# Patient Record
Sex: Male | Born: 1950 | Race: White | Hispanic: No | Marital: Married | Smoking: Never smoker
Health system: Southern US, Community
[De-identification: ages and names within clinical notes are randomized; demographics above are authoritative.]

## PROBLEM LIST (undated history)

## (undated) DIAGNOSIS — M316 Other giant cell arteritis: Secondary | ICD-10-CM

## (undated) DIAGNOSIS — G709 Myoneural disorder, unspecified: Secondary | ICD-10-CM

## (undated) DIAGNOSIS — W5501XA Bitten by cat, initial encounter: Secondary | ICD-10-CM

## (undated) DIAGNOSIS — Z8619 Personal history of other infectious and parasitic diseases: Secondary | ICD-10-CM

## (undated) DIAGNOSIS — I10 Essential (primary) hypertension: Secondary | ICD-10-CM

## (undated) DIAGNOSIS — M1990 Unspecified osteoarthritis, unspecified site: Secondary | ICD-10-CM

## (undated) DIAGNOSIS — F32A Depression, unspecified: Secondary | ICD-10-CM

## (undated) DIAGNOSIS — Z974 Presence of external hearing-aid: Secondary | ICD-10-CM

## (undated) DIAGNOSIS — C61 Malignant neoplasm of prostate: Secondary | ICD-10-CM

## (undated) DIAGNOSIS — K529 Noninfective gastroenteritis and colitis, unspecified: Secondary | ICD-10-CM

## (undated) DIAGNOSIS — I639 Cerebral infarction, unspecified: Secondary | ICD-10-CM

## (undated) DIAGNOSIS — L03115 Cellulitis of right lower limb: Secondary | ICD-10-CM

## (undated) DIAGNOSIS — E119 Type 2 diabetes mellitus without complications: Secondary | ICD-10-CM

## (undated) DIAGNOSIS — F329 Major depressive disorder, single episode, unspecified: Secondary | ICD-10-CM

## (undated) DIAGNOSIS — I890 Lymphedema, not elsewhere classified: Secondary | ICD-10-CM

## (undated) HISTORY — PX: EYE SURGERY: SHX253

## (undated) HISTORY — DX: Myoneural disorder, unspecified: G70.9

## (undated) HISTORY — DX: Depression, unspecified: F32.A

## (undated) HISTORY — DX: Other giant cell arteritis: M31.6

## (undated) HISTORY — DX: Personal history of other infectious and parasitic diseases: Z86.19

## (undated) HISTORY — DX: Bitten by cat, initial encounter: W55.01XA

## (undated) HISTORY — DX: Cellulitis of right lower limb: L03.115

## (undated) HISTORY — DX: Major depressive disorder, single episode, unspecified: F32.9

## (undated) HISTORY — PX: OTHER SURGICAL HISTORY: SHX169

## (undated) HISTORY — PX: ABDOMINOPLASTY: SUR9

## (undated) HISTORY — DX: Essential (primary) hypertension: I10

## (undated) HISTORY — DX: Noninfective gastroenteritis and colitis, unspecified: K52.9

## (undated) HISTORY — DX: Malignant neoplasm of prostate: C61

## (undated) HISTORY — PX: TEMPORAL ARTERY BIOPSY / LIGATION: SUR132

---

## 2004-09-17 ENCOUNTER — Encounter: Payer: Self-pay | Admitting: Unknown Physician Specialty

## 2005-03-13 ENCOUNTER — Emergency Department: Payer: Self-pay | Admitting: Emergency Medicine

## 2005-06-28 ENCOUNTER — Other Ambulatory Visit: Payer: Self-pay

## 2005-06-28 ENCOUNTER — Inpatient Hospital Stay: Payer: Self-pay | Admitting: Internal Medicine

## 2005-07-26 ENCOUNTER — Ambulatory Visit: Payer: Self-pay | Admitting: Family Medicine

## 2005-08-14 ENCOUNTER — Ambulatory Visit: Payer: Self-pay | Admitting: Family Medicine

## 2005-08-17 ENCOUNTER — Ambulatory Visit: Payer: Self-pay | Admitting: Family Medicine

## 2006-11-07 ENCOUNTER — Ambulatory Visit: Payer: Self-pay | Admitting: Family Medicine

## 2006-11-19 ENCOUNTER — Ambulatory Visit: Payer: Self-pay | Admitting: Family Medicine

## 2007-01-04 ENCOUNTER — Ambulatory Visit: Payer: Self-pay | Admitting: Unknown Physician Specialty

## 2007-05-22 ENCOUNTER — Ambulatory Visit: Payer: Self-pay | Admitting: Unknown Physician Specialty

## 2007-07-22 ENCOUNTER — Ambulatory Visit: Payer: Self-pay | Admitting: Otolaryngology

## 2007-07-30 ENCOUNTER — Ambulatory Visit: Payer: Self-pay | Admitting: Internal Medicine

## 2007-11-05 LAB — HM COLONOSCOPY

## 2009-05-31 ENCOUNTER — Ambulatory Visit: Payer: Self-pay | Admitting: Family Medicine

## 2009-10-18 ENCOUNTER — Emergency Department: Payer: Self-pay | Admitting: Emergency Medicine

## 2009-10-21 ENCOUNTER — Ambulatory Visit: Payer: Self-pay | Admitting: Internal Medicine

## 2009-10-22 ENCOUNTER — Ambulatory Visit: Payer: Self-pay | Admitting: Internal Medicine

## 2009-12-28 ENCOUNTER — Other Ambulatory Visit: Payer: Self-pay | Admitting: Internal Medicine

## 2009-12-31 ENCOUNTER — Ambulatory Visit: Payer: Self-pay | Admitting: Internal Medicine

## 2011-06-29 ENCOUNTER — Other Ambulatory Visit: Payer: Self-pay | Admitting: Specialist

## 2011-07-14 ENCOUNTER — Other Ambulatory Visit: Payer: Self-pay | Admitting: Unknown Physician Specialty

## 2011-08-18 ENCOUNTER — Ambulatory Visit: Payer: Self-pay | Admitting: Family Medicine

## 2011-09-15 ENCOUNTER — Ambulatory Visit: Payer: Self-pay | Admitting: Unknown Physician Specialty

## 2011-09-18 ENCOUNTER — Ambulatory Visit: Payer: Self-pay | Admitting: Family Medicine

## 2011-09-18 LAB — PATHOLOGY REPORT

## 2011-11-28 ENCOUNTER — Ambulatory Visit: Payer: Self-pay | Admitting: Urology

## 2012-04-15 ENCOUNTER — Ambulatory Visit: Payer: Self-pay | Admitting: Family Medicine

## 2012-04-17 ENCOUNTER — Ambulatory Visit: Payer: Self-pay | Admitting: Family Medicine

## 2012-09-09 ENCOUNTER — Ambulatory Visit: Payer: Self-pay | Admitting: Internal Medicine

## 2012-09-10 HISTORY — PX: OTHER SURGICAL HISTORY: SHX169

## 2013-02-10 ENCOUNTER — Ambulatory Visit: Payer: Self-pay | Admitting: Family Medicine

## 2013-02-14 ENCOUNTER — Ambulatory Visit: Payer: Self-pay | Admitting: Family Medicine

## 2013-02-27 ENCOUNTER — Encounter: Payer: Self-pay | Admitting: Internal Medicine

## 2013-02-27 ENCOUNTER — Ambulatory Visit (INDEPENDENT_AMBULATORY_CARE_PROVIDER_SITE_OTHER): Payer: 59 | Admitting: Internal Medicine

## 2013-02-27 VITALS — BP 120/65 | HR 60 | Temp 97.8°F | Ht 71.0 in | Wt 364.0 lb

## 2013-02-27 DIAGNOSIS — R059 Cough, unspecified: Secondary | ICD-10-CM

## 2013-02-27 DIAGNOSIS — R609 Edema, unspecified: Secondary | ICD-10-CM

## 2013-02-27 DIAGNOSIS — I10 Essential (primary) hypertension: Secondary | ICD-10-CM

## 2013-02-27 DIAGNOSIS — R05 Cough: Secondary | ICD-10-CM

## 2013-02-27 MED ORDER — FAMOTIDINE 20 MG PO TABS: ORAL_TABLET | ORAL | Status: DC

## 2013-02-27 MED ORDER — NEBIVOLOL HCL 5 MG PO TABS: 5.0000 mg | ORAL_TABLET | Freq: Every day | ORAL | Status: DC

## 2013-02-27 MED ORDER — GABAPENTIN 300 MG PO CAPS: 300.0000 mg | ORAL_CAPSULE | Freq: Three times a day (TID) | ORAL | Status: DC

## 2013-02-27 MED ORDER — PANTOPRAZOLE SODIUM 40 MG PO TBEC: 40.0000 mg | DELAYED_RELEASE_TABLET | Freq: Every day | ORAL | Status: DC

## 2013-02-27 NOTE — Progress Notes (Signed)
Subjective:     Patient ID: Edward King, male   DOB: 13-Jul-1951, 62 y.o.   MRN: [id number]  HPI  72 yowm never smoker never very aerobically active but able to play baseball   fine until moved from [place] to [place] in 2001 but since then has developed chronic cough referred 02/28/2013 to pulmonary clinic by Dr Lelon Huh.  02/27/2013 1st pulmonary ov / Melvyn Novas cc recurrent cough x 10 years  lasting more than a few weeks assoc with fever and lung congestion and requiring abx typically comes on once a year and lasts 3 weeks to a month and lingering sense of chest congestion comes and goes assoc with doe x mall walking.  Has seen Dr Caryn Section x 3-4 times in last 4 weeks with resolution no fever, mucus is still light  Green after 3 abx plus inhaler assoc with sense of throat and sinus congestion with excessive pnds   Also worsening doe > slow adls and chronic leg swelling.  Can't lie flat s getting choked for about a year so sits up at > 30 degrees.  No obvious daytime variabilty or assoc   cp or chest tightness, subjective wheeze overt  hb symptoms. No unusual exp hx or h/o childhood pna/ asthma or premature birth to his knowledge.     Also denies any obvious fluctuation of symptoms with weather or environmental changes or other aggravating or alleviating factors except as outlined above   ROS  The following are not active complaints unless bolded sore throat, dysphagia, dental problems, itching, sneezing,  nasal congestion or excess/ purulent secretions, ear ache,   fever, chills, sweats, unintended wt loss, pleuritic or exertional cp, hemoptysis,  orthopnea pnd or leg swelling, presyncope, palpitations, heartburn, abdominal pain, anorexia, nausea, vomiting, diarrhea  or change in bowel or urinary habits, change in stools or urine, dysuria,hematuria,  rash, arthralgias, visual complaints, headache, numbness weakness or ataxia or problems with walking or coordination,  change in mood/affect or  memory.      Review of Systems     Objective:   Physical Exam Wt Readings from Last 3 Encounters:  02/27/13 364 lb (165.109 kg)     Massively obese pleasant amb wm who  failed to answer a single question asked in a straightforward manner, tending to go off on tangents or answer questions with ambiguous medical terms or diagnoses and seemed perplexed when asked the same question more than once for clarification.  Uses terms like sinus, bronchitis, congestion without being able to further define what is bothering him  HEENT: nl dentition, turbinates, and orophanx. Nl external ear canals without cough reflex   NECK :  without JVD/Nodes/TM/ nl carotid upstrokes bilaterally   LUNGS: no acc muscle use, clear to A and P bilaterally without cough on insp or exp maneuvers   CV:  RRR  no s3 or murmur or increase in P2,  ABD:  soft and nontender with nl excursion in the supine position. No bruits or organomegaly, bowel sounds nl  MS:  warm without deformities, calf tenderness, cyanosis or clubbing Massive chronic appearing bilateral leg swelling with pitting edema  SKIN: warm and dry without lesions    NEURO:  alert, approp, no deficits       Assessment:           Plan:

## 2013-02-27 NOTE — Patient Instructions (Addendum)
Try pantoprazole 40 mg  Take 30-60 min before first meal of the day and Pepcid 20 mg one bedtime until  Return  I think of reflux for chronic cough like I do oxygen for fire (doesn't cause the fire but once you get the oxygen suppressed it usually goes away regardless of the exact cause).   Increase your neurontin gabapentin)  to 300 mg with each meal  Stop lopressor and start bystolic 5 mg daily instead  GERD (REFLUX)  is an extremely common cause of respiratory symptoms just like yours, many times with no significant heartburn at all.    It can be treated with medication, but also with lifestyle changes including avoidance of late meals, excessive alcohol, smoking cessation, and avoid fatty foods, chocolate, peppermint, colas, red wine, and acidic juices such as orange juice.  NO MINT OR MENTHOL PRODUCTS SO NO COUGH DROPS  USE SUGARLESS CANDY INSTEAD (jolley ranchers or Stover's)  NO OIL BASED VITAMINS - use powdered substitutes  Schedule Sinus CT at [place] [facility]  Please schedule a follow up office visit in 4 weeks, sooner if needed

## 2013-02-28 DIAGNOSIS — R609 Edema, unspecified: Secondary | ICD-10-CM | POA: Insufficient documentation

## 2013-02-28 DIAGNOSIS — R059 Cough, unspecified: Secondary | ICD-10-CM | POA: Insufficient documentation

## 2013-02-28 DIAGNOSIS — R05 Cough: Secondary | ICD-10-CM | POA: Insufficient documentation

## 2013-02-28 NOTE — Assessment & Plan Note (Addendum)
The most common causes of chronic cough in immunocompetent adults include the following: upper airway cough syndrome (UACS), previously referred to as postnasal drip syndrome (PNDS), which is caused by variety of rhinosinus conditions; (2) asthma; (3) GERD; (4) chronic bronchitis from cigarette smoking or other inhaled environmental irritants; (5) nonasthmatic eosinophilic bronchitis; and (6) bronchiectasis.   These conditions, singly or in combination, have accounted for up to 94% of the causes of chronic cough in prospective studies.   Other conditions have constituted no >6% of the causes in prospective studies These have included bronchogenic carcinoma, chronic interstitial pneumonia, sarcoidosis, left ventricular failure, ACEI-induced cough, and aspiration from a condition associated with pharyngeal dysfunction.    Chronic cough is often simultaneously caused by more than one condition. A single cause has been found from 38 to 82% of the time, multiple causes from 18 to 62%. Multiply caused cough has been the result of three diseases up to 42% of the time.       Most likely this is  Classic Upper airway cough syndrome, so named because it's frequently impossible to sort out how much is  CR/sinusitis with freq throat clearing (which can be related to primary GERD)   vs  causing  secondary (" extra esophageal")  GERD from wide swings in gastric pressure that occur with throat clearing, often  promoting self use of mint and menthol lozenges that reduce the lower esophageal sphincter tone and exacerbate the problem further in a cyclical fashion.   These are the same pts (now being labeled as having "irritable larynx syndrome" by some cough centers) who not infrequently have a history of having failed to tolerate ace inhibitors,  dry powder inhalers or biphosphonates or report having atypical reflux symptoms that don't respond to standard doses of PPI , and are easily confused as having aecopd or asthma  flares by even experienced allergists/ pulmonologists.   Next step is sinus ct and max empirical rx for GERD.  See instructions for specific recommendations which were reviewed directly with the patient who was given a copy with highlighter outlining the key components.   Already on neurontin so increase to 300 tid to cover irritable larynx syndrome and change lopressor to bystolic short term to cover ? Cough variant asthma then once better baseline switch back to lopressor or a cheaper generic selective B blocker like biosprolol    The standardized cough guidelines published in Chest by Lissa Morales in 2006 are still the best available and consist of a multiple step process (up to 12!) , not a single office visit,  and are intended  to address this problem logically,  with an alogrithm dependent on response to empiric treatment at  each progressive step  to determine a specific diagnosis with  minimal addtional testing needed. Therefore if adherence is an issue or can't be accurately verified,  it's very unlikely the standard evaluation and treatment will be successful here.

## 2013-02-28 NOTE — Assessment & Plan Note (Signed)
?   Cough variant asthma then once better baseline switch back to lopressor or a cheaper generic selective B blocker like biosprolol

## 2013-02-28 NOTE — Assessment & Plan Note (Addendum)
Pt says this is chronic and doesn't correlate with his sob but worrisome for R ht failure related to Morbid obesity or OHS  Will defer w/u to Dr Caryn Section but would rec check albumin, tsh, bnp,  creatinine and overnight pulse ox RA along with Echo if not recently done.

## 2013-03-05 ENCOUNTER — Ambulatory Visit: Payer: Self-pay | Admitting: Internal Medicine

## 2013-03-10 ENCOUNTER — Telehealth: Payer: Self-pay | Admitting: Internal Medicine

## 2013-03-10 NOTE — Telephone Encounter (Signed)
Have not seen any results

## 2013-03-10 NOTE — Telephone Encounter (Signed)
Patient inquiring about CT results done @ [facility] last week. Dr. Melvyn Novas have you seen these results? Please advise, thank you

## 2013-03-10 NOTE — Telephone Encounter (Signed)
I have sent fax to [facility] requesting the results Will await fax

## 2013-03-10 NOTE — Telephone Encounter (Signed)
Please advise Magda Paganini thanks

## 2013-03-11 NOTE — Telephone Encounter (Signed)
Called, spoke with pt.  Requesting CT sinus results done a week ago.   Advised Magda Paganini has requested these results yesterday. Pt didn't understanding [facility] could be part of [facility] but we not have results yet. I explained to him that there system is not linked with ours yet. He was still frustrated that he hasn't heard anything stating "I could be dying and we don't know."  CT results have been received but they are CT Chest results; MW ordered CT Sinus. I have spoken with Magda Paganini and have re-requested to have CT Sinus results faxed to triage.   MW is off today and tomorrow.  Given pt is frustrated with this, will see if doc of the day can advise until MW returns. Will await CT Sinus results.

## 2013-03-11 NOTE — Telephone Encounter (Signed)
Patient calling back about ct results.

## 2013-03-11 NOTE — Telephone Encounter (Signed)
CT sinus results received. Will give to Doc of Day to address, please. Thank You

## 2013-03-12 NOTE — Telephone Encounter (Signed)
Place whatever we have from [facility] in my lookat stack and I'll manually enter under Problem list where everyone can find it

## 2013-03-12 NOTE — Telephone Encounter (Signed)
Spoke with pt and notified of results and he verbalized understanding

## 2013-03-12 NOTE — Telephone Encounter (Signed)
I don't see these as final under result section - were they outside results?

## 2013-03-12 NOTE — Telephone Encounter (Signed)
Dr Lenna Gilford reviewed the ct sinus results  CT was neg  I have ATC the pt and had to [facility] Will place results of ct sinus and chest in [place] for review

## 2013-03-13 ENCOUNTER — Encounter: Payer: Self-pay | Admitting: Internal Medicine

## 2013-03-28 ENCOUNTER — Encounter: Payer: 59 | Admitting: Internal Medicine

## 2013-04-21 ENCOUNTER — Ambulatory Visit: Payer: 59 | Admitting: Internal Medicine

## 2013-11-04 ENCOUNTER — Ambulatory Visit: Payer: Self-pay | Admitting: Family Medicine

## 2013-11-17 ENCOUNTER — Ambulatory Visit: Payer: Self-pay | Admitting: Family Medicine

## 2013-12-30 ENCOUNTER — Ambulatory Visit: Payer: Self-pay | Admitting: Otolaryngology

## 2014-02-25 ENCOUNTER — Ambulatory Visit: Payer: Self-pay | Admitting: Family Medicine

## 2014-02-27 LAB — HM HEPATITIS C SCREENING LAB: HM Hepatitis Screen: NEGATIVE

## 2014-05-06 DIAGNOSIS — E1149 Type 2 diabetes mellitus with other diabetic neurological complication: Secondary | ICD-10-CM | POA: Insufficient documentation

## 2014-05-06 DIAGNOSIS — E119 Type 2 diabetes mellitus without complications: Secondary | ICD-10-CM | POA: Insufficient documentation

## 2014-08-22 HISTORY — PX: OTHER SURGICAL HISTORY: SHX169

## 2014-08-29 ENCOUNTER — Inpatient Hospital Stay: Payer: Self-pay | Admitting: Internal Medicine

## 2014-08-29 HISTORY — PX: DOPPLER ECHOCARDIOGRAPHY: SHX263

## 2014-08-29 LAB — CBC WITH DIFFERENTIAL/PLATELET
BASOS PCT: 1 %
Basophil #: 0.1 10*3/uL (ref 0.0–0.1)
EOS ABS: 0.1 10*3/uL (ref 0.0–0.7)
Eosinophil %: 1.3 %
HCT: 51.7 % (ref 40.0–52.0)
HGB: 16.5 g/dL (ref 13.0–18.0)
LYMPHS ABS: 1.3 10*3/uL (ref 1.0–3.6)
Lymphocyte %: 11.5 %
MCH: 27.6 pg (ref 26.0–34.0)
MCHC: 31.8 g/dL — AB (ref 32.0–36.0)
MCV: 87 fL (ref 80–100)
MONO ABS: 0.9 x10 3/mm (ref 0.2–1.0)
Monocyte %: 8.2 %
NEUTROS ABS: 8.7 10*3/uL — AB (ref 1.4–6.5)
Neutrophil %: 78 %
PLATELETS: 250 10*3/uL (ref 150–440)
RBC: 5.96 10*6/uL — ABNORMAL HIGH (ref 4.40–5.90)
RDW: 15 % — ABNORMAL HIGH (ref 11.5–14.5)
WBC: 11.2 10*3/uL — AB (ref 3.8–10.6)

## 2014-08-29 LAB — BASIC METABOLIC PANEL
ANION GAP: 7 (ref 7–16)
BUN: 18 mg/dL (ref 7–18)
CALCIUM: 8.4 mg/dL — AB (ref 8.5–10.1)
CO2: 29 mmol/L (ref 21–32)
CREATININE: 1.16 mg/dL (ref 0.60–1.30)
Chloride: 102 mmol/L (ref 98–107)
EGFR (African American): >60
GLUCOSE: 150 mg/dL — AB (ref 65–99)
Osmolality: 280 (ref 275–301)
Potassium: 3.5 mmol/L (ref 3.5–5.1)
SODIUM: 138 mmol/L (ref 136–145)

## 2014-08-29 LAB — SEDIMENTATION RATE: ERYTHROCYTE SED RATE: 1 mm/h (ref 0–20)

## 2014-08-30 LAB — CBC WITH DIFFERENTIAL/PLATELET
Basophil #: 0 10*3/uL (ref 0.0–0.1)
Basophil %: 0.3 %
EOS PCT: 0.1 %
Eosinophil #: 0 10*3/uL (ref 0.0–0.7)
HCT: 56.3 % — ABNORMAL HIGH (ref 40.0–52.0)
HGB: 18.1 g/dL — ABNORMAL HIGH (ref 13.0–18.0)
LYMPHS PCT: 9.6 %
Lymphocyte #: 0.9 10*3/uL — ABNORMAL LOW (ref 1.0–3.6)
MCH: 27.9 pg (ref 26.0–34.0)
MCHC: 32.1 g/dL (ref 32.0–36.0)
MCV: 87 fL (ref 80–100)
MONO ABS: 0.1 x10 3/mm — AB (ref 0.2–1.0)
MONOS PCT: 0.6 %
NEUTROS PCT: 89.4 %
Neutrophil #: 8.3 10*3/uL — ABNORMAL HIGH (ref 1.4–6.5)
PLATELETS: 243 10*3/uL (ref 150–440)
RBC: 6.47 10*6/uL — ABNORMAL HIGH (ref 4.40–5.90)
RDW: 15 % — AB (ref 11.5–14.5)
WBC: 9.3 10*3/uL (ref 3.8–10.6)

## 2014-08-30 LAB — LIPID PANEL
Cholesterol: 174 mg/dL (ref 0–200)
HDL: 40 mg/dL (ref 40–60)
Ldl Cholesterol, Calc: 119 mg/dL — ABNORMAL HIGH (ref 0–100)
Triglycerides: 73 mg/dL (ref 0–200)
VLDL Cholesterol, Calc: 15 mg/dL (ref 5–40)

## 2014-08-30 LAB — BASIC METABOLIC PANEL
Anion Gap: 6 — ABNORMAL LOW (ref 7–16)
BUN: 20 mg/dL — ABNORMAL HIGH (ref 7–18)
CHLORIDE: 99 mmol/L (ref 98–107)
Calcium, Total: 9 mg/dL (ref 8.5–10.1)
Co2: 27 mmol/L (ref 21–32)
Creatinine: 1.1 mg/dL (ref 0.60–1.30)
EGFR (African American): >60
EGFR (Non-African Amer.): >60
Glucose: 196 mg/dL — ABNORMAL HIGH (ref 65–99)
OSMOLALITY: 273 (ref 275–301)
Potassium: 4.5 mmol/L (ref 3.5–5.1)
SODIUM: 132 mmol/L — AB (ref 136–145)

## 2014-08-30 LAB — HEMOGLOBIN A1C: Hemoglobin A1C: 6 % (ref 4.2–6.3)

## 2014-09-01 LAB — PATHOLOGY REPORT

## 2014-10-09 LAB — HEPATIC FUNCTION PANEL
ALT: 22 U/L (ref 10–40)
AST: 13 U/L — AB (ref 14–40)

## 2014-10-09 LAB — CBC AND DIFFERENTIAL
HEMATOCRIT: 52 % (ref 41–53)
Hemoglobin: 18 g/dL — AB (ref 13.5–17.5)
Platelets: 205 10*3/uL (ref 150–399)
WBC: 11.5 10*3/mL

## 2014-10-09 LAB — HEMOGLOBIN A1C: HEMOGLOBIN A1C: 6.2 % — AB (ref 4.0–6.0)

## 2014-10-09 LAB — LIPID PANEL
Cholesterol: 200 mg/dL (ref 0–200)
HDL: 62 mg/dL (ref 35–70)
LDL Cholesterol: 118 mg/dL
Triglycerides: 101 mg/dL (ref 40–160)

## 2014-10-09 LAB — BASIC METABOLIC PANEL
BUN: 27 mg/dL — AB (ref 4–21)
Creatinine: 1.3 mg/dL (ref 0.6–1.3)
GLUCOSE: 80 mg/dL
POTASSIUM: 3.9 mmol/L (ref 3.4–5.3)
Sodium: 144 mmol/L (ref 137–147)

## 2014-10-09 LAB — TSH: TSH: 2.55 u[IU]/mL (ref 0.41–5.90)

## 2014-10-09 LAB — PSA: PSA: 6.6

## 2014-12-18 DIAGNOSIS — C61 Malignant neoplasm of prostate: Secondary | ICD-10-CM

## 2014-12-18 HISTORY — DX: Malignant neoplasm of prostate: C61

## 2014-12-29 ENCOUNTER — Ambulatory Visit: Payer: Self-pay | Admitting: Rheumatology

## 2015-03-05 ENCOUNTER — Encounter
Admit: 2015-03-05 | Disposition: A | Discharge status: Home / Self Care | Payer: Self-pay | Attending: Family Medicine | Admitting: Family Medicine

## 2015-03-19 ENCOUNTER — Encounter
Admit: 2015-03-19 | Disposition: A | Discharge status: Home / Self Care | Payer: Self-pay | Attending: Family Medicine | Admitting: Family Medicine

## 2015-04-10 NOTE — Consult Note (Signed)
General Aspect Visual loss with temporal arteritis   Present Illness The patient is a 64 year old, male with past medical history of hypertension and diabetes mellitus who was seen by his dentist for toothache. The patient reported that he was having jaw pain bilaterally, regarding which he was seen by his dentist approximately 1 week ago. The patient was initially diagnosed with TMJ and he was given a Medrol Dosepak. The patient has completed a 1 week course of Medrol Dosepak with no significant improvement.  The patient eventually, started developing pain in his temporal area and visual loss in the left eye for the past 2 days for which he ws seen by his eye doctor. According to the patient's ophthalmologist, Dr. Delynn Flavin. The patient???s  vision was 20/20 in his left eye yesterday, but today the patient was having difficulty and he can barely see anything, but light. As there was a concern for acute loss of vision of the left eye, which would most likely be from giant cell arteritis, Dr. Karren Burly had this patient as a direct admission to the hospital to give high-dose Solu-Medrol. She has contacted me directly for an urgent temporal artery biopsy.  The patient denies any dysphagia and difficulty with speech. Denies any weakness or numbness in his extremities. He has never had any history of stroke or mini stroke in the past, but he is concerned about his headache.  The patient denies any chest pain or shortness of breath. No other consults.   PAST MEDICAL HISTORY: Diabetes mellitus type 2, hypertension and Diabetic neuropathy.   Home Medications: Medication Instructions Status  acetaminophen-oxyCODONE 325 mg-5 mg oral tablet 1 tab(s) orally 3 times a day, As Needed - for Pain Active  predniSONE 50 mg oral tablet 2 tab(s) orally once a day Active  Valsartan 160-12.5  one tablet  orally once a day Active  Metformin 500 mg 2 tablets at lunch and 1 tablet at supper  orally 2 times a day  Active  Gabapentin 300 mg 1 capsule at lunch and 2 at night  orally 2 times a day Active  Glipizide 5 mg one tablet  orally once a day Active  Etodolac 500 mg 2 tablets  orally once a day Active  Citolopram 20 mg one tablet  orally once a day Active  Lialda 1.2 grams 2 tablets  orally once a day Active    No Known Allergies:   Case History:  Family History Non-Contributory   Social History negative tobacco, negative ETOH, negative Illicit drugs   Review of Systems:  Fever/Chills No   Cough No   Sputum No   Abdominal Pain No   Diarrhea No   Constipation No   Nausea/Vomiting No   SOB/DOE No   Chest Pain No   Telemetry Reviewed NSR   Dysuria No   Physical Exam:  GEN well developed, well nourished, no acute distress   HEENT PERRL, hearing intact to voice, moist oral mucosa   NECK supple  trachea midline   RESP normal resp effort  no use of accessory muscles   CARD regular rate  no JVD   ABD denies tenderness  soft   EXTR negative cyanosis/clubbing, negative edema   SKIN No rashes, No ulcers   NEURO cranial nerves intact, follows commands   PSYCH alert, A+O to time, place, person, good insight   Nursing/Ancillary Notes: **Vital Signs.:   13-Sep-15 06:15  Vital Signs Type Routine  Temperature Temperature (F) 97.6  Celsius 36.4  Temperature  Source oral  Pulse Pulse 75  Respirations Respirations 21  Systolic BP Systolic BP 169  Diastolic BP (mmHg) Diastolic BP (mmHg) 89  Mean BP 108  Pulse Ox % Pulse Ox % 94  Pulse Ox Activity Level  At rest  Oxygen Delivery Room Air/ 21 %   Routine Chem:  13-Sep-15 04:50   Cholesterol, Serum 174  Triglycerides, Serum 73  HDL (INHOUSE) 40  VLDL Cholesterol Calculated 15  LDL Cholesterol Calculated  119 (Result(s) reported on 30 Aug 2014 at 05:34AM.)  Glucose, Serum  196  BUN  20  Creatinine (comp) 1.10  Sodium, Serum  132  Potassium, Serum 4.5  Chloride, Serum 99  CO2, Serum 27  Calcium (Total), Serum  9.0  Anion Gap  6  Osmolality (calc) 273  eGFR (African American) >60  eGFR (Non-African American) >60 (eGFR values <56m/min/1.73 m2 may be an indication of chronic kidney disease (CKD). Calculated eGFR is useful in patients with stable renal function. The eGFR calculation will not be reliable in acutely ill patients when serum creatinine is changing rapidly. It is not useful in  patients on dialysis. The eGFR calculation may not be applicable to patients at the low and high extremes of body sizes, pregnant women, and vegetarians.)  Hemoglobin A1c ([facility]) 6.0 (The American Diabetes Association recommends that a primary goal of therapy should be <7% and that physicians should reevaluate the treatment regimen in patients with HbA1c values consistently >8%.)  Routine Hem:  13-Sep-15 04:50   WBC (CBC) 9.3  RBC (CBC)  6.47  Hemoglobin (CBC)  18.1  Hematocrit (CBC)  56.3  Platelet Count (CBC) 243  MCV 87  MCH 27.9  MCHC 32.1  RDW  15.0  Neutrophil % 89.4  Lymphocyte % 9.6  Monocyte % 0.6  Eosinophil % 0.1  Basophil % 0.3  Neutrophil #  8.3  Lymphocyte #  0.9  Monocyte #  0.1  Eosinophil # 0.0  Basophil # 0.0 (Result(s) reported on 30 Aug 2014 at 05:21AM.)   [place]    12-Sep-15 18:05, [place] B[place]  [place] Bilateral   REASON FOR EXAM:    CVA  COMMENTS:       PROCEDURE: [place] - [place] DOPPL[place] BILATERAL  - Aug 29 2014  6:05PM     CLINICAL DATA:  CVA, history of hypertension, visual disturbance and  diabetes.    EXAM:  BILATERAL CAROTID DUPLEX ULTRASOUND    TECHNIQUE:  GPearline Cablesscale imaging, color Doppler and duplex ultrasound were  performed of bilateral carotid and vertebral arteries in the neck.  COMPARISON:  Head CT -08/29/2014; 12/30/2013    FINDINGS:  The examination is degraded due to patient body habitus and poor  sonographic window.    Criteria: Quantification of carotid stenosis is based on velocity  parameters that correlate the  residual internal carotid diameter  with NASCET-based stenosis levels, using the diameter of the distal  internal carotid lumen as the denominator for stenosis measurement.    The following velocity measurements were obtained:    RIGHT  ICA:  100.5/24.6 cm/sec    CCA:  1678.9/38.1cm/sec    SYSTOLIC ICA/CCA RATIO:  00.17   DIASTOLIC ICA/CCA RATIO:  15.10   ECA:  107.2 cm/sec    LEFT    ICA:  81.8/24.9 cm/sec    CCA:  1258.5/27.7cm/sec  SYSTOLIC ICA/CCA RATIO:  0.6    DIASTOLIC ICA/CCA RATIO:  18.24   ECA:  115.6 cm/sec  RIGHT CAROTID ARTERY: There is a minimal amount of eccentric  echogenic plaque within the right carotid bulb (image 6), not  resulting in elevated peak systolic velocities within the  interrogated course of the right internal carotid artery to suggest  a hemodynamically significant stenosis.    RIGHT VERTEBRAL ARTERY:  Antegrade flow    LEFT CAROTID ARTERY: There is a very minimal amount of intimal wall  thickening within the left carotid bulb (images 40 and 53), not  resulting in elevated peak systolic velocities within the  interrogated course the left internal carotid artery to suggest a  hemodynamically significant stenosis.    LEFT VERTEBRAL ARTERY:  Antegrade flow     IMPRESSION:  Degraded examination demonstrates a minimal amount of bilateral  intimal thickening and atherosclerotic plaque, not resulting in  hemodynamically significant stenosis.      Electronically Signed    By: Sandi Mariscal M.D.    On: 08/31/2014 08:16         Verified By: Aileen Fass, M.D.,    Impression 1. 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 The patietn is started on Solu-Medrol 250 mg IV every 6 hours for the next 3 days, as recommended by ophthalmologist. After 3 days, we will continue the patient on prednisone 100 mg p.o. once daily. Given that he has already been started on steroids as an out patietn I will  plan for Temporal artery biopsy tomorrow.  Risks and benefits were reviewed all questions answered he agree to proceed with surgery.   2. 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 We will also rule out transient ischemic attack.  Ophthalmology consult is placed.  3. Diabetic neuropathy. We will continue gabapentin, which is his home medication.  4. History of diabetes mellitus. The patient will be on sliding scale insulin.  5. Hypertension. We will resume his home medications if the initial lab work is normal.   Plan level 3 consult   Electronic Signatures: Hortencia Pilar (MD)  (Signed 27-Sep-15 13:24)  Authored: General Aspect/Present Illness, Home Medications, Allergies, History and Physical Exam, Vital Signs, Labs, Radiology, Impression/Plan   Last Updated: 27-Sep-15 13:24 by Hortencia Pilar (MD)

## 2015-04-10 NOTE — Consult Note (Signed)
Referring Physician:  [name] :   Primary Care Physician:  [name] : [facility], [address], [place], [place] [address], [place] [phone]  Reason for Consult: Admit Date: 30-Aug-2014  Chief Complaint: L eye vision loss  Reason for Consult: CVA   History of Present Illness: History of Present Illness:   64 yo RHD M presents to [facility] from opthtamologist secondary to vision loss.  Apparently two weeks ago, pt reports headache and jaw pain in which he saw his dentist.  He recieved steroids and symptoms got better but then when his steroids were tapered he developed vision loss in the L eye only.  Pt reports that he saw sparkly lights and then this improved again after a few days of steroids.  Vision still blurry on the L but improved.  No numbness, weakness, tingling reported now.  ROS:  General denies complaints   HEENT L eye vision loss, jaw pain   Lungs no complaints   Cardiac no complaints   GI no complaints   GU no complaints   Musculoskeletal no complaints   Extremities no complaints   Skin no complaints   Neuro no complaints   Endocrine no complaints   Psych no complaints   Past Medical/Surgical Hx:  depression:   HTN:   Past Medical/ Surgical Hx:  Past Medical History HTN, depression, DM   Past Surgical History none   Home Medications: Medication Instructions Last Modified Date/Time  Valsartan 160-12.5  one tablet  orally once a day 12-Sep-15 16:59  Metformin 500 mg 2 tablets at lunch and 1 tablet at supper  orally 2 times a day 12-Sep-15 16:59  Gabapentin 300 mg 1 capsule at lunch and 2 at night  orally 2 times a day 12-Sep-15 16:59  Glipizide 5 mg one tablet  orally once a day 12-Sep-15 16:59  Etodolac 500 mg 2 tablets  orally once a day 12-Sep-15 16:59  Citolopram 20 mg one tablet  orally once a day 12-Sep-15 16:59  Lialda 1.2 grams 2 tablets  orally once a day 12-Sep-15 16:59   Allergies:  No Known Allergies:    Allergies:  Allergies NKDA   Social/Family History: Employment Status: currently employed  Lives With: spouse  Living Arrangements: house  Social History: no tob, no EtOH, no illicits  Family History: no stroke or seizures   Vital Signs: **Vital Signs.:   13-Sep-15 16:58  Vital Signs Type Routine  Temperature Temperature (F) 97.9  Celsius 36.6  Temperature Source oral  Pulse Pulse 87  Respirations Respirations 20  Systolic BP Systolic BP 384  Diastolic BP (mmHg) Diastolic BP (mmHg) 63  Mean BP 85  Pulse Ox % Pulse Ox % 94  Pulse Ox Activity Level  At rest  Oxygen Delivery Room Air/ 21 %   Physical Exam: General: overweight, NAD  HEENT: normocephalic, sclera nonicteric, oropharynx clear, no papilledema  Neck: supple, no JVD, no bruits  Chest: CTA B, no wheezing, good movement  Cardiac: RRR, no murmurs, no edema, 2+ pulses  Extremities: no C/C/E, FROM   Neurologic Exam: Mental Status: alert and oriented x 3, normal speech and language, follows complex commands  Cranial Nerves: PERRLA, EOMI, nl VF, face symmetric, tongue midline, shoulder shrug equal  Motor Exam: 5/5 B normal, tone, no tremor  Deep Tendon Reflexes: 2+/4 B, plantars downgoing B, no Hoffman  Sensory Exam: pinprick, temperature, and vibration intact B  Coordination: FTN and HTS WNL, nl RAM   Lab Results: LabObservation:  12-Sep-15 17:37   OBSERVATION Reason for Test  Routine Chem:  13-Sep-15 04:50   Cholesterol, Serum 174  Triglycerides, Serum 73  HDL (INHOUSE) 40  VLDL Cholesterol Calculated 15  LDL Cholesterol Calculated  119 (Result(s) reported on 30 Aug 2014 at 05:34AM.)  Glucose, Serum  196  BUN  20  Creatinine (comp) 1.10  Sodium, Serum  132  Potassium, Serum 4.5  Chloride, Serum 99  CO2, Serum 27  Calcium (Total), Serum 9.0  Anion Gap  6  Osmolality (calc) 273  eGFR (African American) >60  eGFR (Non-African American) >60 (eGFR values <73m/min/1.73 m2 may be an indication of  chronic kidney disease (CKD). Calculated eGFR is useful in patients with stable renal function. The eGFR calculation will not be reliable in acutely ill patients when serum creatinine is changing rapidly. It is not useful in  patients on dialysis. The eGFR calculation may not be applicable to patients at the low and high extremes of body sizes, pregnant women, and vegetarians.)  Hemoglobin A1c ([facility]) 6.0 (The American Diabetes Association recommends that a primary goal of therapy should be <7% and that physicians should reevaluate the treatment regimen in patients with HbA1c values consistently >8%.)  Routine Hem:  12-Sep-15 16:46   Erythrocyte Sed Rate 1 (Result(s) reported on 29 Aug 2014 at 05:24PM.)  13-Sep-15 04:50   WBC (CBC) 9.3  RBC (CBC)  6.47  Hemoglobin (CBC)  18.1  Hematocrit (CBC)  56.3  Platelet Count (CBC) 243  MCV 87  MCH 27.9  MCHC 32.1  RDW  15.0  Neutrophil % 89.4  Lymphocyte % 9.6  Monocyte % 0.6  Eosinophil % 0.1  Basophil % 0.3  Neutrophil #  8.3  Lymphocyte #  0.9  Monocyte #  0.1  Eosinophil # 0.0  Basophil # 0.0 (Result(s) reported on 30 Aug 2014 at 0[facility])   Radiology Results: CT:    12-Sep-15 17:28, CT Head Without Contrast  CT Head Without Contrast   REASON FOR EXAM:    CVA  COMMENTS:       PROCEDURE: CT  - CT HEAD WITHOUT CONTRAST  - Aug 29 2014  5:28PM     CLINICAL DATA:  Vision loss.  History of giant cell arteritis.    EXAM:  CT HEAD WITHOUT CONTRAST    TECHNIQUE:  Contiguous axial images were obtained from the base of the skull  through the vertex without intravenous contrast.    COMPARISON:  None.  FINDINGS:  The ventricles are normal in size and configuration. No extra-axial  fluid collections are identified. The gray-white differentiation is  normal. No CT findings for acute intracranial process such as  hemorrhage or infarction. No mass lesions. The brainstem and  cerebellum are grossly normal.    The bony structures  are intact. The paranasal sinuses and mastoid  air cells are clear. The globes are intact.     IMPRESSION:  No acute intracranial findings or mass lesion.      Electronically Signed    By: MKalman JewelsM.D.    On: 08/29/2014 17:40         Verified By: PMarlane Hatcher M.D.,   Radiology Impression: Radiology Impression: CT of head personally reviewed by me and is normal   Impression/Recommendations: Recommendations:   prior notes reviewed by me  reviewed by me   Possible temporal arteritis-  pt does not describe all system facts but ESR may be falsely low due to prior steroids;  biopsy results pending;  could also be diabetic or hypertensive retinopathy or anterior retinal artery occlusion. Headache-  resolved Peripheral neuropathy-  stable continue steroids per opthtamology start ASA 45m daily if not on it check carotid [place]af/u biopsy with vascular no acute neurologic needs now, please call with questions or concerns  Electronic Signatures: SJamison Neighbor(MD)  (Signed 13-Sep-15 17:32)  Authored: REFERRING PHYSICIAN, Primary Care Physician, Consult, History of Present Illness, Review of Systems, PAST MEDICAL/SURGICAL HISTORY, HOME MEDICATIONS, ALLERGIES, Social/Family History, NURSING VITAL SIGNS, Physical Exam-, LAB RESULTS, RADIOLOGY RESULTS, Recommendations   Last Updated: 13-Sep-15 17:32 by SJamison Neighbor(MD)

## 2015-04-10 NOTE — H&P (Signed)
PATIENT NAME:  Edward King, Edward King MR#:  [id number] DATE OF BIRTH:  03-Jun-1951  DATE OF ADMISSION:  08/29/2014  PRIMARY CARE PHYSICIAN: Kirstie Peri. Caryn Section, MD.  REFERRING PHYSICIAN: Ophthalmology, Grayland Ormond, MD.  CHIEF COMPLAINT: Acute loss of left-sided vision.   HISTORY OF PRESENT ILLNESS: The patient is a 64 year old, Caucasian male with past medical history of hypertension and diabetes mellitus who was seen by his ophthalmologist for toothache in the temporal region. Actually, the patient is reporting that he was having jaw pain bilaterally, regarding which he was seen by his dentist approximately 1 week ago. The patient was initially diagnosed with TMJ and he was given a Medrol Dosepak. The patient has completed a 1 week course of Medrol Dosepak with no significant improvement. He was followed up by his dentist, who thought the jaw pain could be from grinding teeth. The patient eventually, started developing this temporal area headache for the past 2 days and was seen by his eye doctor. According to the patient's ophthalmologist, Dr. Delynn Flavin. The patient's  vision was 20/20 in his left eye yesterday, but today the patient was having difficulty and he can barely see anything, but light. As there was a concern for acute loss of vision of the left eye, which could probably be from giant cell arteritis, Dr. Karren Burly had this patient as a direct admission to the hospital to give high-dose Solu-Medrol. She also has recommended carotid Dopplers and 2-D echocardiogram.    The patient denies any dysphagia and difficulty with speech. Denies any weakness or numbness in his extremities. He has never had any history of stroke or mini stroke in the past, but he is concerned about his headache. Dr. Karren Burly will also talk to the vascular surgeons regarding temporal artery biopsy. The patient denies any chest pain or shortness of breath. No other consults.   PAST MEDICAL HISTORY: Diabetes mellitus  type 2, hypertension and Diabetic neuropathy.    PAST SURGICAL HISTORY: Remote history of abdominal surgery.   ALLERGIES: No known drug allergies.   PSYCHOSOCIAL HISTORY: Lives at home with wife. No history of smoking, alcohol or illicit drug usage.   FAMILY HISTORY: Mom has history of hypertension. Diabetes does not run in his family.   HOME MEDICATIONS: Valsartan 150/12.5 once daily, metformin 500 mg 2 times a day, Lialda 1.2 gram 2 tablets p.o. once daily, glipizide 5 mg p.o. once daily, gabapentin 300 mg 1 tablet p.o. 2 times a day, Etodolac 500 mg 2 tablets p.o. once daily, citalopram 20 mg p.o. once daily.   REVIEW OF SYSTEMS:  CONSTITUTIONAL: Denies any fever, fatigue, weight loss or weight gain.  EYES: Denies redness or pain, but complaining of acute loss of vision in the left eye and intact vision in the right eye.  EARS: No ear discharge.  NOSE: No epistaxis.  LUNGS: Denies any COPD, shortness of breath, asthma.  CARDIOVASCULAR: Denies any heart attacks, palpitations, syncope, valvular problems.  GASTROINTESTINAL: Denies any hematemesis, melena, anemia, hematochezia, nausea, vomiting, diarrhea, constipation.  NEUROLOGIC: Denies any vertigo, ataxia, history of stroke or TIA in the past.  MUSCULOSKELETAL: Denies any back pain, neck pain, arthritis.  ENDOCRINE: Has history of diabetes mellitus. Denies any thyroid problems. Denies any cold or heat intolerance. Denies any polyuria, nocturia.  PSYCHIATRIC: Denies any ADD, OCD.   PHYSICAL EXAMINATION: VITAL SIGNS: Temperature 98.4, pulse 80, respirations 18, blood pressure 134/75, pulse oximetry 95%.  GENERAL APPEARANCE: Not in acute distress. Moderately built and nourished.  HEENT: Normocephalic, atraumatic. Pupils  are equally reacting to light and accommodation. No scleral icterus. No conjunctival injection. In the left eye, peripheral field of vision is diminished, but the patient can count fingers and identify colors. Right eye is  intact. Oral cavity is intact. Moist mucous membranes and trachea  is midline.  NECK: Supple. No JVD. No thyromegaly. Range of motion is intact.  LUNGS: Clear to auscultation bilaterally. No accessory muscle use noted. Chest wall normal on palpation. CARDIAC: S1 and S2 normal. Regular rate and rhythm. No murmurs.  GASTROINTESTINAL: Soft. Bowel sounds are positive in all 4 quadrants. Nontender, nondistended. No hepatosplenomegaly. No masses felt.  NEUROLOGIC: Awake, alert and oriented x 3. Cranial nerves 2 through 12 are grossly intact except for the loss of vision in left eye. Reflexes are 2+. Motor and sensory are intact.  EXTREMITIES: No edema. No cyanosis. No clubbing.  SKIN: Warm to touch. Normal turgor. No rashes. No lesions.  MUSCULOSKELETAL: No joint effusion, tenderness, erythema.  PSYCHIATRIC: Normal mood and affect.   DIAGNOSTIC DATA: CT head STAT is ordered, which is pending. Carotid Dopplers and 2-D echocardiogram are pending.   LABORATORY DATA: STAT CBC, Chem-8, ESR and CRP are pending.   ASSESSMENT AND PLAN: A 64 year old, Caucasian male is sent over to the hospital as a direct admission from his eye doctor for acute loss of vision on the left side. Dr. Karren Burly is concerned left-sided central retinal artery occlusion and possible temporal arteritis.   ASSESSMENT AND PLAN: 1. 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 We will also rule out transient ischemic attack. Admit the patient to telemetry. Initial STAT blood work is ordered including CBC, Chem-8, ESR and CRP. A STAT CT of the head is ordered, which is pending. Echocardiogram and carotid Dopplers are pending. We will get neuro checks. Physical therapy consult is ordered. We will provide him aspirin 81 mg if CT of the head is negative for acute bleed. Ophthalmology consult is placed.  2. Temporal headache, most likely from  temporal/giant cell, arteritis. We will start him on Solu-Medrol 250 mg IV every 6 hours for the next 3 days, as recommended by ophthalmologist. After 3 days, we will continue the patient on prednisone 100 mg p.o. once daily. Vascular surgery consult is placed to the on-call vascular surgeon, Dr. Delana Meyer regarding possible temporal artery biopsy as soon as possible, as the patient already used a Medrol Dosepak for 1 week prior to this admission. Dr. Karren Burly is going to talk to Dr. Delana Meyer regarding the temporal artery biopsy.  3. Possible transient ischemic attack versus cerebrovascular accident. We will get a stroke work-up with a CT of the head, carotid Dopplers and 2-D echocardiogram. The patient will be started on aspirin after STAT CT of the head if it is negative for acute bleeds.  4. History of diabetes mellitus. The patient will be on sliding scale insulin. If his Chem-8 is normal, we will consider resuming his home medications.  5. Hypertension. We will resume his home medications if the initial lab work is normal.  6. Diabetic neuropathy. We will continue gabapentin, which is his home medication. We will provide him gastrointestinal and deep vein thrombosis prophylaxis with Pepcid and Lovenox subcutaneous.  7. He is full code. Wife is the [place]. Diagnosis and plan of care was discussed in detail with the patient and his wife at bedside. They both verbalized understanding of the plan.  TOTAL TIME SPENT ON ADMISSION: Was 45 minutes.  Thank you, Dr. Karren Burly, for sending this patient as a direct admit to the hospitalist service.    ____________________________ [name], MD ag:TT D: 08/29/2014 17:28:13 ET T: 08/29/2014 18:14:54 ET JOB#: [id number]  cc: [name], MD, <Dictator> Grayland Ormond, MD Kirstie Peri. Caryn Section, MD Katha Cabal, MD   [name] MD ELECTRONICALLY SIGNED 08/30/2014 17:30

## 2015-04-10 NOTE — Op Note (Signed)
PATIENT NAME:  Edward King, Edward King MR#:  [id number] DATE OF BIRTH:  1951-02-08  DATE OF PROCEDURE:  08/30/2014  PREOPERATIVE DIAGNOSES:  1.  Visual loss, left eye.  2.  Giant cell arteritis.   POSTOPERATIVE DIAGNOSES: 1.  Visual loss, left eye. 2.  Giant cell arteritis.  PROCEDURE PERFORMED: Left temporal artery biopsy.   SURGEON: Katha Cabal, MD   ANESTHESIA: MAC.   ESTIMATED BLOOD LOSS: Minimal.   SPECIMEN: A 5.5 cm segment of temporal artery to pathology for permanent section.   INDICATIONS: Mr. Chill is a 64 year old gentleman who presented to Dr. Karren Burly with acute loss of vision, left eye. He was found to have a retinal artery occlusion, high suspicion for temporal arteritis is noted and the patient is admitted for IV steroids and is undergoing biopsy for confirmation of diagnosis. Risks and benefits are reviewed. All questions answered. The patient agrees to proceed.   DESCRIPTION OF PROCEDURE: The patient is taken to the operating room, placed in supine position. After adequate sedation is achieved, his left temporal area is shaved and then prepped and draped in a sterile fashion. Appropriate timeout is called.   Lidocaine 1% with epinephrine is infiltrated in the soft tissues and a linear incision is created along the anterior margin of his ear. Temporal artery is identified below the fascia and then dissected proximally and distally. It is ligated with 4-0 Vicryl proximally and distally and then removed without difficulty. It is measured on the back table and noted to be 5.5 cm. The wound is then irrigated. Hemostasis is obtained. It is closed with 3-0 Vicryl for the subcutaneous tissues and 4-0 Monocryl for the subcuticular. Dermabond is applied. The patient tolerated the procedure well and there were no immediate complications. Sponge and needle counts were correct. He was taken to the recovery room in excellent condition.    ____________________________ Katha Cabal, MD ggs:TT D: 08/30/2014 16:10:50 ET T: 08/30/2014 17:54:27 ET JOB#: [id number]  cc: Katha Cabal, MD, <Dictator> Grayland Ormond, MD Kirstie Peri. Caryn Section, MD Katha Cabal MD ELECTRONICALLY SIGNED 10/05/2014 20:41

## 2015-04-10 NOTE — Consult Note (Signed)
PATIENT NAME:  Edward King, Edward King MR#:  [id number] DATE OF BIRTH:  08/17/1951  DATE OF CONSULTATION:  08/30/2014  REFERRING PHYSICIAN:  [name], MD CONSULTING PHYSICIAN:  Grayland Ormond, MD  REASON FOR CONSULTATION: Possible temporal arteritis.   HISTORY OF PRESENT ILLNESS: The patient is a 64 year old white male with a history of type 2 diabetes, obesity, and hypertension who presented to me on 08/28/2014 with intermittent episodes of blurred vision in the left eye, which would remain blurred for about 5 minutes and then return to a normal 20/20 baseline afterwards. The patient's only other review of systems at the time had included jaw pain for which he had been seeing his dentist. 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 He told me that the pain was improved on that medication and when asked, the patient specified that the jaw pain did not worsen upon chewing, but simply was worse upon awakening in the morning, presumably because the jaw grinding happened at night.   On examination on 08/28/2014, the patient had 20/20 vision in both eyes and essentially a normal eye exam in both eyes. Of note, there were no intravascular plaques in the vasculature of the left eye. There was mild dry eye on the left eye, for which the patient was given a sample of artificial tears. Because temporal arteritis was on the differential diagnosis for the patient's complaint, as well as an embolic phenomenon to the eyes, I had sent the patient for sedimentation rate, CRP, and platelets as well as set him up for carotid Dopplers as well as a cardiac echocardiogram to be done very soon. The patient was to follow up with me the following week. He returned to me, however, on 08/29/2014 stating that he had multiple episodes of the complete blurring of vision in  only the left eye that was returning back to baseline just like before, but it was happening more often and he asked to be seen again.   I saw him in clinic and at the time, his vision had severely diminished from previous 20/20 to now light perception only on the left eye. The right eye remained 20/20 and at its normal baseline. The patient stated that sometime around 1:30 that afternoon, he had an episode of blurring that was very severe and that never improved back.   On that examination, I noted that the patient, in the left eye, had what appeared to be a very early central retinal artery occlusion, mild pallor of the optic nerve head, as well as a 3+ afferent pupillary defect in the left eye. His vasculature did not show any intravascular plaques at that time either. The right eye was examined and found to be at its normal baseline as well.   A check of the patient's lab results revealed a sedimentation rate of 5, a C-reactive protein of 17.0 as well as platelet count of 277,000. Given those results and given that the patient had only the day before finished his Medrol Dosepak, the decision was made to admit the patient for potential temporal arteritis causing vision loss in the left eye. Prior to leaving, the patient was given rebreathing therapy to see if his vision could improve in the left eye and it did not.   PAST MEDICAL HISTORY: As stated above, includes diabetes type 2  as well as hypertension.  PAST EYE HISTORY: Essentially unremarkable other than the fact that he has a mild refractive error.   FAMILY HISTORY: The patient does not have any significant eye history in the family aside from refractive error.   PHYSICAL EXAMINATION: as of 08/29/2014, the patient's visual acuity in the right eye was 20/20 at distance with correction and the left eye was light perception only with correction. Intraocular pressure was 17 in the right eye, 18 in the left eye. Motility was full. The right eye had  confrontational visual fields that were full. The left eye was generally depressed. Pupils, as mentioned before, left eye showed a 3+ afferent pupillary defect. Anterior segment exam was essentially normal with normal eyelids, conjunctivae, cornea, and anterior chamber in both eyes. On the posterior segment exam, the optic nerve on the right side was flat, pink, and sharp with a 0.3 cup-to-disc ratio. On the left, there was 1+ pallor of the nerve, a 0.3 cup-to-disc ratio without any frank swelling of the edges of the optic nerve. The macula on the right eye as well as the vessels and periphery were all within normal limits. In the left eye, macula had mild diffuse swelling and pallor and no intravascular plaques were found. The periphery was fully attached.   ASSESSMENT: This 64 year old diabetic, obese, hypertensive white male exhibits some symptoms that are concerning for temporal arteritis, although he is somewhat young for the condition and is not exhibiting totally classic examination findings. Otherwise on the differential for the patient's central retinal artery occlusion, this could be due to an embolic phenomenon such as from the carotid arteries or from an intramural plaque potentially from the heart. There is the remote chance of a myxoma. There is also a remote chance that the patient has some sort of clotting disorder, but there is no indication otherwise that this is the case.   PLAN:  This was all discussed with the patient's primary hospitalist and a plan was made to start the patient on IV Solu-Medrol a gram a day divided into 4 doses x 12 doses, so over 3 days, and subsequent to that to start the patient on oral prednisone at 100 mg a day afterwards. I spoke with Dr. Delana Meyer, the vascular surgeon, for help with scheduling the patient for a temporal artery biopsy. In addition, I asked for carotid dopplers as well as for carotid echocardiogram and I will continue to follow this patient as an  inpatient while he is here.   I appreciate the help of all the consultants who are on board with this patient's case.    ____________________________ Grayland Ormond, MD apv:ah D: 08/30/2014 13:17:16 ET T: 08/30/2014 13:25:43 ET JOB#: [id number]  cc: Grayland Ormond, MD, <Dictator> Sharrod Achille P VIN [facility] MD ELECTRONICALLY SIGNED 09/09/2014 13:46

## 2015-04-10 NOTE — Consult Note (Signed)
Brief Consult Note: Diagnosis: left eye visual loss.   Patient was seen by consultant.   Discussed with Attending MD.   Comments: will procced with biopsy today when OR available.  Electronic Signatures: Hortencia Pilar (MD)  (Signed 13-Sep-15 23:06)  Authored: Brief Consult Note   Last Updated: 13-Sep-15 23:06 by Hortencia Pilar (MD)

## 2015-04-10 NOTE — Discharge Summary (Signed)
PATIENT NAME:  Edward King, Edward King MR#:  [id number] DATE OF BIRTH:  24-May-1951  DATE OF ADMISSION:  08/29/2014 DATE OF DISCHARGE:  09/01/2014  DISCHARGE DIAGNOSES: 1.  Temporal arteritis.  2.  Acute left vision loss.  3.  Hypertension.  4.  Diabetes mellitus.  5.  Obesity.   CONSULTATIONS:  1.  Dr. Karren Burly of ophthalmology.  2.  Dr. Tamala Julian with neurology.  3.  Dr. Delana Meyer with vascular surgery.   HOME MEDICATIONS: 1.  Valsartan 160/12.5 one tablet daily.  2.  Metformin 5 mg 2 tablets 2 times a day. 3.  Gabapentin 300 mg at lunch and 2 at night.   4.  Glipizide 5 mg oral once a day.  5.  Etodolac 500 mg 2 tablets once a day.  6.  Citalopram 20 mg once a day.  7.  Lialda 1.2 grams 2 tablets once a day.  8.  Prednisone 100 mg oral once a day for 15 days.  9.  Acetaminophen oxycodone 325/5 one tablet oral 3 times a day as needed for pain.   IMAGING STUDIES: Include a CT scan of the head without contrast which showed nothing acute.   A carotid Doppler showed no significant stenosis.   Echocardiogram showed ejection fraction of 60% to 65% with no significant abnormalities.   ADMITTING HISTORY AND PHYSICAL AND HOSPITAL COURSE: Please see detailed H and P dictated by Dr. Margaretmary Eddy. In brief, a 64 year old male patient with history of acute left-sided vision loss was admitted to the hospitalist service directly from his ophthalmologist's office secondary to concern for a temporal arteritis. The patient had a CT scan of the head done which was normal. Echo and carotid Doppler showed nothing acute. The patient had started on Solu-Medrol 250 mg IV q. 6 hours which has received 12 doses of over 3 days. Dr. Delana Meyer with vascular surgery was consulted who did a temporal artery biopsy which is pending at this point. The patient was seen by neurology who did not feel like patient has any optic neuritis.   Today on examination, the patient is much improved. Still has some mild blurred vision on his left  eye. Motor strength in upper and lower extremities is normal. Sensation was intact all over and has had a normal gait, ambulated well in the hallway.   DISCHARGE INSTRUCTIONS: Low-sodium, carbohydrate-controlled diet. Follow up with Dr. Karren Burly of ophthalmology in 1 or 2 weeks.   TIME SPENT ON DAY OF DISCHARGE IN DISCHARGE ACTIVITY: 40 minutes.    ____________________________ Edward Alf Kiersten Coss, MD srs:at D: 09/01/2014 15:55:14 ET T: 09/01/2014 17:36:12 ET JOB#: [id number]  cc: Alveta Heimlich R. Chelcie Estorga, MD, <Dictator> Neita Carp MD ELECTRONICALLY SIGNED 09/05/2014 14:32

## 2015-04-15 ENCOUNTER — Other Ambulatory Visit: Payer: Self-pay | Admitting: Radiology

## 2015-04-15 DIAGNOSIS — C61 Malignant neoplasm of prostate: Secondary | ICD-10-CM

## 2015-04-21 ENCOUNTER — Ambulatory Visit
Admission: RE | Admit: 2015-04-21 | Discharge: 2015-04-21 | Disposition: A | Discharge status: Home / Self Care | Payer: 59 | Source: Ambulatory Visit | Attending: Radiology | Admitting: Radiology

## 2015-04-21 DIAGNOSIS — C61 Malignant neoplasm of prostate: Secondary | ICD-10-CM | POA: Diagnosis not present

## 2015-04-22 ENCOUNTER — Ambulatory Visit: Payer: 59 | Attending: Family Medicine | Admitting: Occupational Therapy

## 2015-04-22 ENCOUNTER — Encounter: Payer: Self-pay | Admitting: Occupational Therapy

## 2015-04-22 DIAGNOSIS — R262 Difficulty in walking, not elsewhere classified: Secondary | ICD-10-CM | POA: Insufficient documentation

## 2015-04-22 DIAGNOSIS — I890 Lymphedema, not elsewhere classified: Secondary | ICD-10-CM | POA: Diagnosis not present

## 2015-04-22 DIAGNOSIS — M6281 Muscle weakness (generalized): Secondary | ICD-10-CM | POA: Insufficient documentation

## 2015-04-22 NOTE — Patient Instructions (Signed)
Patient educated on wear and care of new compression garments.  He is to wear knee high compression garments bilaterally each day, applying in the morning and removing before bedtime to shower.  He will then transition to his night time garments Ginger [employer]) to wear nightly.  He was educated on washing daytime compression garments each day for optimal results and as per manufacturers guidelines.  Patient is aware of the need for compression wrapping if his edema increases and he cannot fit into compression garments, he will then need to wrap 24-48 hours and retry garments.

## 2015-04-23 NOTE — Therapy (Signed)
[place] MAIN [facility] SERVICES [address] [place], [place], [address] Phone: [phone]   Fax:  [phone]  Occupational Therapy Treatment  Patient Details  Name: Edward King MRN: [id number] Date of Birth: 07-14-1951 Referring Provider:  Birdie Sons, MD  Encounter Date: 04/22/2015      OT End of Session - 04/22/15 1010    Visit Number 18   Number of Visits 36   Date for OT Re-Evaluation 05/28/15   OT Start Time 0936   OT Stop Time 1010   OT Time Calculation (min) 34 min      Past Medical History  Diagnosis Date  . Diabetes   . Hypertension     History reviewed. No pertinent past surgical history.  There were no vitals filed for this visit.  Visit Diagnosis:  Lymphedema      Subjective Assessment - 04/22/15 1642    Subjective  Patient reports he is pleased with his garments this date which arrived.  He did disclose to therapist that he has prostate cancer and had seed implant this past week.  Had a CT scan as well.    Patient Stated Goals Patient wants to be able to decongest legs, be fitted for compression garments for day and night and be independent with donning and doffing.   Currently in Pain? No/denies             LYMPHEDEMA/ONCOLOGY QUESTIONNAIRE - 04/22/15 1648    What other symptoms do you have   Is it Hard or Difficult finding clothes that fit Yes                 OT Treatments/Exercises (OP) - 04/23/15 0001    ADLs   LB Dressing Patient was seen this date for fitting of custom compression garments, bilateral knee high, flat knit Elvarex, black with open toe, silicone dot border.  Also fitted for night time custom Reid Sleeves bilateral knee high.  Patient instructed on donning, doffing, wear, care and management of lymphedema with compression.  Patient was issued dycem for assistance with donning and doffing at the heel.  He was able to demo with modified independence.                  OT Education - 04/22/15 1423    Education provided Yes   Education Details Donning/doffing compression garments, wear and care of garments, use of dycem to help with donning.   Person(s) Educated Patient   Methods Explanation;Demonstration;Verbal cues   Comprehension Verbalized understanding;Returned demonstration;Verbal cues required             OT Long Term Goals - 04/22/15 1010    OT LONG TERM GOAL #1   Title Patient will be independent with HEP, night time compression and self manual lymph drainage for home program in 12 weeks   Baseline patient has been seen in the past and familiar with above but needs refresher to return to independent status.   Time 12   Period Weeks   Status Achieved   OT LONG TERM GOAL #2   Title LE girth will decrease at least 4 cm at each level from ankle to knee without respiratory compromise in 12 weeks   Baseline increased edema in bilateral lower ext. see flow sheets   Time 12   Period Weeks   Status Achieved   OT LONG TERM GOAL #3   Title Optimal edema control will be achieved, patient to be independent in  wearing of appropriate compression garments in 12 weeks   Baseline patient received garments this date and will follow up in one week.   Time 12   Period Weeks   Status Partially Met   OT LONG TERM GOAL #4   Title Patient will demonstrate donning and doffing of compression garments for Bilateral lower extremities with modified independence    Baseline occasional difficulty   Time 12   Period Weeks   Status Achieved   OT LONG TERM GOAL #5   Title Optimal edema control will be achieved and patient will be measured for an appropriate compression garment in 12 weeks   Baseline needs new garments after legs decongest and due to 100+ pounds weight loss.     Time 12   Period Weeks   Status Achieved               Plan - 04/23/15 1428    Clinical Impression Statement Patient has made excellent progress with decongestion of lower  extremities and has now been fitted for new custom compression garments.  Bilateral custom elavarex knee highs with open toe, black, compression 30-40 mmhg One pair to wash and one to dry.  He also received bilateral knee high Reid sleeves for night time wear which were gauged to appropriate compression.  Patient able to demo donning and doffing  of garments.  He requires dycem and a shoehorn to assist with dressing.  Will plan to follow up with patient in one week and reassess to determine if compression is adequate and to see if patient has any issues, questions or is in need of additional training to manage lymphedema.   Pt will benefit from skilled therapeutic intervention in order to improve on the following deficits (Retired) Abnormal gait;Difficulty walking;Increased edema;Decreased mobility   Rehab Potential Good   OT Frequency 3x / week   OT Duration 12 weeks   OT Treatment/Interventions Self-care/ADL training   Plan Follow up with patient next week and reassess to determine if compression is adequate and to see if patient has any issues, questions or is in need of additional training to manage lymphedema.   OT Home Exercise Plan Patient will perform daily exercises as previously outlined while in compression each day.   Consulted and Agree with Plan of Care Patient        Problem List Patient Active Problem List   Diagnosis Date Noted  . Cough 02/28/2013  . Peripheral edema 02/28/2013  . HBP (high blood pressure) 02/28/2013    Federica Allport T Tomasita Morrow, OTR/L, CLT  04/23/2015, 2:48 PM  [place] MAIN [facility] SERVICES [address] [place], [place], [address] Phone: [phone]   Fax:  [phone]

## 2015-06-15 ENCOUNTER — Encounter: Payer: Self-pay | Admitting: Family Medicine

## 2015-06-15 ENCOUNTER — Ambulatory Visit (INDEPENDENT_AMBULATORY_CARE_PROVIDER_SITE_OTHER): Payer: 59 | Admitting: Family Medicine

## 2015-06-15 VITALS — BP 118/64 | HR 76 | Temp 99.5°F | Resp 16 | Wt 288.4 lb

## 2015-06-15 DIAGNOSIS — M316 Other giant cell arteritis: Secondary | ICD-10-CM | POA: Insufficient documentation

## 2015-06-15 DIAGNOSIS — J069 Acute upper respiratory infection, unspecified: Secondary | ICD-10-CM

## 2015-06-15 DIAGNOSIS — L03116 Cellulitis of left lower limb: Secondary | ICD-10-CM

## 2015-06-15 HISTORY — DX: Other giant cell arteritis: M31.6

## 2015-06-15 MED ORDER — AMOXICILLIN-POT CLAVULANATE 875-125 MG PO TABS: 1.0000 | ORAL_TABLET | Freq: Two times a day (BID) | ORAL | Status: DC

## 2015-06-15 MED ORDER — CEFTRIAXONE SODIUM 1 G IJ SOLR
1.0000 g | Freq: Once | INTRAMUSCULAR | Status: AC
  Administered 2015-06-15: 1 g via INTRAMUSCULAR

## 2015-06-15 NOTE — Progress Notes (Signed)
Subjective:     Patient ID: Edward King, male   DOB: 04-07-1951, 64 y.o.   MRN: [id number]  HPI  Chief Complaint  Patient presents with  . Sinus Problem    patient comes in office today with concerns of sinus congestion and low grade. Patient states symptoms began yesterday with a fever of 100 and states that his sinuses were draining only on the left side of his face  . Leg Pain    patient has concerns of left leg pain that began yesterday in which he describes as sore. No injury or accident related to pain  Has hx of lower extremity lymphedema but states his left lower leg has become a bit more swollen with tenderness and increased warmth. He reports being scratched by his kitten in this area a few days ago. Remains on prednisone 7.5 mg.   Review of Systems  Constitutional: Positive for chills.  HENT: Negative for sore throat.   Respiratory: Negative for cough.        Objective:   Physical Exam Ears: T.M's intact without inflammation Throat: no tonsillar enlargement or exudate Neck: no cervical adenopathy Lungs: clear Left lower extremity with moderate tenderness, increased warmth, and erythema over most of the anteriolateral leg below the knee.    Assessment:    1. Upper respiratory infection  2. Cellulitis of left lower extremity - amoxicillin-clavulanate (AUGMENTIN) 875-125 MG per tablet; Take 1 tablet by mouth 2 (two) times daily.  Dispense: 20 tablet; Refill: 0 - cefTRIAXone (ROCEPHIN) injection 1 g; Inject 1 g into the muscle once.    Plan:    Report to the ER if leg or fever worsens despite treatment above. Discussed symptomatic treatment of cold symptoms. F/u in 48 hours.

## 2015-06-15 NOTE — Patient Instructions (Signed)
If fever or leg pain worsens report to the emergency room. For cold sx: Discussed use of Mucinex D for congestion, Delsym for cough, and Benadryl for postnasal drainage

## 2015-06-16 DIAGNOSIS — C61 Malignant neoplasm of prostate: Secondary | ICD-10-CM | POA: Insufficient documentation

## 2015-06-16 DIAGNOSIS — G4733 Obstructive sleep apnea (adult) (pediatric): Secondary | ICD-10-CM | POA: Insufficient documentation

## 2015-06-16 DIAGNOSIS — I10 Essential (primary) hypertension: Secondary | ICD-10-CM | POA: Insufficient documentation

## 2015-06-16 DIAGNOSIS — Z8546 Personal history of malignant neoplasm of prostate: Secondary | ICD-10-CM | POA: Insufficient documentation

## 2015-06-16 DIAGNOSIS — E291 Testicular hypofunction: Secondary | ICD-10-CM | POA: Insufficient documentation

## 2015-06-16 DIAGNOSIS — Z8679 Personal history of other diseases of the circulatory system: Secondary | ICD-10-CM | POA: Insufficient documentation

## 2015-06-17 ENCOUNTER — Encounter: Payer: Self-pay | Admitting: Family Medicine

## 2015-06-17 ENCOUNTER — Ambulatory Visit (INDEPENDENT_AMBULATORY_CARE_PROVIDER_SITE_OTHER): Payer: 59 | Admitting: Family Medicine

## 2015-06-17 ENCOUNTER — Ambulatory Visit
Admission: RE | Admit: 2015-06-17 | Discharge: 2015-06-17 | Disposition: A | Discharge status: Home / Self Care | Payer: 59 | Source: Ambulatory Visit | Attending: Family Medicine | Admitting: Family Medicine

## 2015-06-17 VITALS — BP 98/62 | HR 68 | Temp 98.0°F | Resp 16 | Wt 288.4 lb

## 2015-06-17 DIAGNOSIS — L03116 Cellulitis of left lower limb: Secondary | ICD-10-CM | POA: Diagnosis not present

## 2015-06-17 DIAGNOSIS — R600 Localized edema: Secondary | ICD-10-CM | POA: Diagnosis not present

## 2015-06-17 DIAGNOSIS — M7989 Other specified soft tissue disorders: Secondary | ICD-10-CM | POA: Insufficient documentation

## 2015-06-17 NOTE — Patient Instructions (Signed)
Continue antibiotic. We will call you your ultrasound results.

## 2015-06-17 NOTE — Progress Notes (Signed)
Subjective:     Patient ID: Edward King, male   DOB: 02/02/1951, 64 y.o.   MRN: [id number]  HPI  Chief Complaint  Patient presents with  . Cellulitis    Patient is here in office for 2 day follow up after being seen in ofice 06/15/15 patient was prescribed Augmentin 875-169m. He states that he still has pain and swelling of left leg and has not noticed much improvement.   States his leg still feels sore so has not been wearing his compression stockings for lymphedema.    Review of Systems  Constitutional: Negative for fever and chills.       Objective:   Physical Exam  Constitutional: He appears well-developed and well-nourished. No distress.  Musculoskeletal: He exhibits edema (not pitting).  Skin:  Erythema of left leg appears to be resolving. No calf tenderness.       Assessment:    1. Cellulitis of left leg-improving   2. Leg edema, left: difficult to tell chronic from acute swelling - UKoreaVenous Img Lower Unilateral Left; Future    Plan:    Continue Augmentin, leg elevation, compression stockings as tolerated

## 2015-08-24 ENCOUNTER — Other Ambulatory Visit: Payer: Self-pay | Admitting: Family Medicine

## 2015-08-29 ENCOUNTER — Other Ambulatory Visit: Payer: Self-pay

## 2015-08-29 ENCOUNTER — Emergency Department: Payer: 59

## 2015-08-29 ENCOUNTER — Observation Stay
Admission: EM | Admit: 2015-08-29 | Discharge: 2015-08-30 | Disposition: A | Discharge status: Home / Self Care | Payer: 59 | Attending: Internal Medicine | Admitting: Internal Medicine

## 2015-08-29 ENCOUNTER — Observation Stay: Payer: 59

## 2015-08-29 ENCOUNTER — Encounter: Payer: Self-pay | Admitting: Internal Medicine

## 2015-08-29 DIAGNOSIS — Z23 Encounter for immunization: Secondary | ICD-10-CM | POA: Insufficient documentation

## 2015-08-29 DIAGNOSIS — R2681 Unsteadiness on feet: Secondary | ICD-10-CM | POA: Insufficient documentation

## 2015-08-29 DIAGNOSIS — G4733 Obstructive sleep apnea (adult) (pediatric): Secondary | ICD-10-CM | POA: Insufficient documentation

## 2015-08-29 DIAGNOSIS — H9191 Unspecified hearing loss, right ear: Secondary | ICD-10-CM | POA: Diagnosis not present

## 2015-08-29 DIAGNOSIS — I10 Essential (primary) hypertension: Secondary | ICD-10-CM | POA: Insufficient documentation

## 2015-08-29 DIAGNOSIS — R42 Dizziness and giddiness: Secondary | ICD-10-CM | POA: Diagnosis not present

## 2015-08-29 DIAGNOSIS — R270 Ataxia, unspecified: Secondary | ICD-10-CM | POA: Diagnosis not present

## 2015-08-29 DIAGNOSIS — G4730 Sleep apnea, unspecified: Secondary | ICD-10-CM | POA: Diagnosis not present

## 2015-08-29 DIAGNOSIS — E291 Testicular hypofunction: Secondary | ICD-10-CM | POA: Insufficient documentation

## 2015-08-29 DIAGNOSIS — E119 Type 2 diabetes mellitus without complications: Secondary | ICD-10-CM | POA: Insufficient documentation

## 2015-08-29 DIAGNOSIS — E878 Other disorders of electrolyte and fluid balance, not elsewhere classified: Secondary | ICD-10-CM | POA: Diagnosis not present

## 2015-08-29 DIAGNOSIS — G459 Transient cerebral ischemic attack, unspecified: Secondary | ICD-10-CM

## 2015-08-29 DIAGNOSIS — H538 Other visual disturbances: Secondary | ICD-10-CM | POA: Insufficient documentation

## 2015-08-29 DIAGNOSIS — R600 Localized edema: Secondary | ICD-10-CM | POA: Diagnosis not present

## 2015-08-29 DIAGNOSIS — Z800 Family history of malignant neoplasm of digestive organs: Secondary | ICD-10-CM | POA: Diagnosis not present

## 2015-08-29 DIAGNOSIS — R05 Cough: Secondary | ICD-10-CM | POA: Diagnosis not present

## 2015-08-29 DIAGNOSIS — C61 Malignant neoplasm of prostate: Secondary | ICD-10-CM | POA: Diagnosis not present

## 2015-08-29 DIAGNOSIS — I890 Lymphedema, not elsewhere classified: Secondary | ICD-10-CM | POA: Insufficient documentation

## 2015-08-29 DIAGNOSIS — I639 Cerebral infarction, unspecified: Secondary | ICD-10-CM

## 2015-08-29 DIAGNOSIS — R278 Other lack of coordination: Secondary | ICD-10-CM | POA: Diagnosis not present

## 2015-08-29 LAB — CBC
HCT: 41.8 % (ref 40.0–52.0)
HEMOGLOBIN: 13.8 g/dL (ref 13.0–18.0)
MCH: 30 pg (ref 26.0–34.0)
MCHC: 33 g/dL (ref 32.0–36.0)
MCV: 91 fL (ref 80.0–100.0)
Platelets: 190 10*3/uL (ref 150–440)
RBC: 4.59 MIL/uL (ref 4.40–5.90)
RDW: 15.5 % — ABNORMAL HIGH (ref 11.5–14.5)
WBC: 6.3 10*3/uL (ref 3.8–10.6)

## 2015-08-29 LAB — URINALYSIS COMPLETE WITH MICROSCOPIC (ARMC ONLY)
Bacteria, UA: NONE SEEN
Bilirubin Urine: NEGATIVE
Glucose, UA: NEGATIVE mg/dL
HGB URINE DIPSTICK: NEGATIVE
KETONES UR: NEGATIVE mg/dL
LEUKOCYTES UA: NEGATIVE
NITRITE: NEGATIVE
PH: 5 (ref 5.0–8.0)
PROTEIN: NEGATIVE mg/dL
RBC / HPF: NONE SEEN RBC/hpf (ref 0–5)
SPECIFIC GRAVITY, URINE: 1.018 (ref 1.005–1.030)
WBC UA: NONE SEEN WBC/hpf (ref 0–5)

## 2015-08-29 LAB — BASIC METABOLIC PANEL
ANION GAP: 6 (ref 5–15)
BUN: 21 mg/dL — ABNORMAL HIGH (ref 6–20)
CALCIUM: 9.2 mg/dL (ref 8.9–10.3)
CO2: 31 mmol/L (ref 22–32)
Chloride: 105 mmol/L (ref 101–111)
Creatinine, Ser: 0.87 mg/dL (ref 0.61–1.24)
GFR calc non Af Amer: >60 mL/min (ref >60)
Glucose, Bld: 108 mg/dL — ABNORMAL HIGH (ref 65–99)
Potassium: 4.1 mmol/L (ref 3.5–5.1)
Sodium: 142 mmol/L (ref 135–145)

## 2015-08-29 LAB — GLUCOSE, CAPILLARY
Glucose-Capillary: 94 mg/dL (ref 65–99)
Glucose-Capillary: 116 mg/dL — ABNORMAL HIGH (ref 65–99)
Glucose-Capillary: 175 mg/dL — ABNORMAL HIGH (ref 65–99)
Glucose-Capillary: 205 mg/dL — ABNORMAL HIGH (ref 65–99)

## 2015-08-29 LAB — TROPONIN I: Troponin I: <0.03 ng/mL (ref <0.031)

## 2015-08-29 LAB — HEMOGLOBIN A1C: Hgb A1c MFr Bld: 5.8 % (ref 4.0–6.0)

## 2015-08-29 MED ORDER — GABAPENTIN 300 MG PO CAPS
300.0000 mg | ORAL_CAPSULE | Freq: Every day | ORAL | Status: DC
  Administered 2015-08-29 – 2015-08-30 (×2): 300 mg via ORAL
  Filled 2015-08-29 (×2): qty 1

## 2015-08-29 MED ORDER — VALSARTAN-HYDROCHLOROTHIAZIDE 80-12.5 MG PO TABS: 1.0000 | ORAL_TABLET | Freq: Every day | ORAL | Status: DC

## 2015-08-29 MED ORDER — TAMSULOSIN HCL 0.4 MG PO CAPS
0.4000 mg | ORAL_CAPSULE | Freq: Every day | ORAL | Status: DC
  Administered 2015-08-29: 0.4 mg via ORAL
  Filled 2015-08-29: qty 1

## 2015-08-29 MED ORDER — LORAZEPAM 2 MG/ML IJ SOLN
1.0000 mg | Freq: Once | INTRAMUSCULAR | Status: AC
  Administered 2015-08-29: 11:00:00 1 mg via INTRAVENOUS
  Filled 2015-08-29: qty 1

## 2015-08-29 MED ORDER — METHOTREXATE 2.5 MG PO TABS
20.0000 mg | ORAL_TABLET | ORAL | Status: DC
  Filled 2015-08-29: qty 8

## 2015-08-29 MED ORDER — PREDNISONE 10 MG PO TABS
60.0000 mg | ORAL_TABLET | ORAL | Status: AC
  Administered 2015-08-29: 60 mg via ORAL
  Filled 2015-08-29: qty 1

## 2015-08-29 MED ORDER — ONDANSETRON HCL 4 MG/2ML IJ SOLN: 4.0000 mg | Freq: Four times a day (QID) | INTRAMUSCULAR | Status: DC | PRN

## 2015-08-29 MED ORDER — CITALOPRAM HYDROBROMIDE 20 MG PO TABS
30.0000 mg | ORAL_TABLET | Freq: Every day | ORAL | Status: DC
  Administered 2015-08-29 – 2015-08-30 (×2): 30 mg via ORAL
  Filled 2015-08-29 (×2): qty 1

## 2015-08-29 MED ORDER — ONDANSETRON HCL 4 MG PO TABS: 4.0000 mg | ORAL_TABLET | Freq: Four times a day (QID) | ORAL | Status: DC | PRN

## 2015-08-29 MED ORDER — INSULIN ASPART 100 UNIT/ML ~~LOC~~ SOLN
0.0000 [IU] | Freq: Three times a day (TID) | SUBCUTANEOUS | Status: DC
  Administered 2015-08-30: 2 [IU] via SUBCUTANEOUS
  Filled 2015-08-29: qty 2

## 2015-08-29 MED ORDER — PREDNISONE 20 MG PO TABS: 20.0000 mg | ORAL_TABLET | Freq: Every day | ORAL | Status: DC

## 2015-08-29 MED ORDER — ASPIRIN 81 MG PO CHEW
324.0000 mg | CHEWABLE_TABLET | Freq: Once | ORAL | Status: AC
  Administered 2015-08-29: 324 mg via ORAL
  Filled 2015-08-29: qty 4

## 2015-08-29 MED ORDER — PREDNISONE 5 MG PO TABS
2.5000 mg | ORAL_TABLET | Freq: Every day | ORAL | Status: DC
  Administered 2015-08-29: 2.5 mg via ORAL
  Filled 2015-08-29: qty 1

## 2015-08-29 MED ORDER — PREDNISONE 20 MG PO TABS: 30.0000 mg | ORAL_TABLET | Freq: Every day | ORAL | Status: DC

## 2015-08-29 MED ORDER — PREDNISONE 50 MG PO TABS
50.0000 mg | ORAL_TABLET | Freq: Every day | ORAL | Status: AC
  Administered 2015-08-30: 50 mg via ORAL
  Filled 2015-08-29: qty 1

## 2015-08-29 MED ORDER — ASPIRIN EC 81 MG PO TBEC
81.0000 mg | DELAYED_RELEASE_TABLET | Freq: Every day | ORAL | Status: DC
  Administered 2015-08-29 – 2015-08-30 (×2): 81 mg via ORAL
  Filled 2015-08-29 (×2): qty 1

## 2015-08-29 MED ORDER — MORPHINE SULFATE (PF) 2 MG/ML IV SOLN: 2.0000 mg | INTRAVENOUS | Status: DC | PRN

## 2015-08-29 MED ORDER — FOLIC ACID 1 MG PO TABS
1.0000 mg | ORAL_TABLET | Freq: Every day | ORAL | Status: DC
  Administered 2015-08-29 – 2015-08-30 (×2): 1 mg via ORAL
  Filled 2015-08-29 (×2): qty 1

## 2015-08-29 MED ORDER — IRBESARTAN 75 MG PO TABS
75.0000 mg | ORAL_TABLET | Freq: Every day | ORAL | Status: DC
  Administered 2015-08-29 – 2015-08-30 (×2): 75 mg via ORAL
  Filled 2015-08-29 (×2): qty 1

## 2015-08-29 MED ORDER — INFLUENZA VAC SPLIT QUAD 0.5 ML IM SUSY
0.5000 mL | PREFILLED_SYRINGE | INTRAMUSCULAR | Status: AC
  Administered 2015-08-30: 0.5 mL via INTRAMUSCULAR
  Filled 2015-08-29: qty 0.5

## 2015-08-29 MED ORDER — PREDNISONE 20 MG PO TABS: 40.0000 mg | ORAL_TABLET | Freq: Every day | ORAL | Status: DC

## 2015-08-29 MED ORDER — URIBEL 118 MG PO CAPS: 118.0000 mg | ORAL_CAPSULE | Freq: Four times a day (QID) | ORAL | Status: DC

## 2015-08-29 MED ORDER — HEPARIN SODIUM (PORCINE) 5000 UNIT/ML IJ SOLN
5000.0000 [IU] | Freq: Three times a day (TID) | INTRAMUSCULAR | Status: DC
Start: 2015-08-29 — End: 2015-08-30
  Administered 2015-08-29 – 2015-08-30 (×4): 5000 [IU] via SUBCUTANEOUS
  Filled 2015-08-29 (×4): qty 1

## 2015-08-29 MED ORDER — SODIUM CHLORIDE 0.9 % IJ SOLN
3.0000 mL | Freq: Two times a day (BID) | INTRAMUSCULAR | Status: DC
  Administered 2015-08-29 – 2015-08-30 (×3): 3 mL via INTRAVENOUS

## 2015-08-29 MED ORDER — PREDNISONE 5 MG PO TABS: 2.5000 mg | ORAL_TABLET | Freq: Every day | ORAL | Status: DC

## 2015-08-29 MED ORDER — METHOTREXATE 2.5 MG PO TABS: 20.0000 mg | ORAL_TABLET | ORAL | Status: DC

## 2015-08-29 MED ORDER — PREDNISONE 10 MG PO TABS: 10.0000 mg | ORAL_TABLET | Freq: Every day | ORAL | Status: DC

## 2015-08-29 MED ORDER — MECLIZINE HCL 12.5 MG PO TABS
25.0000 mg | ORAL_TABLET | Freq: Three times a day (TID) | ORAL | Status: DC | PRN
  Administered 2015-08-29: 14:00:00 25 mg via ORAL
  Filled 2015-08-29: qty 2

## 2015-08-29 MED ORDER — HYDROCHLOROTHIAZIDE 12.5 MG PO CAPS
12.5000 mg | ORAL_CAPSULE | Freq: Every day | ORAL | Status: DC
  Administered 2015-08-29 – 2015-08-30 (×2): 12.5 mg via ORAL
  Filled 2015-08-29 (×2): qty 1

## 2015-08-29 MED ORDER — URELLE 81 MG PO TABS
1.0000 | ORAL_TABLET | Freq: Four times a day (QID) | ORAL | Status: DC
  Administered 2015-08-29 – 2015-08-30 (×5): 81 mg via ORAL
  Filled 2015-08-29 (×5): qty 1

## 2015-08-29 NOTE — Care Management Note (Signed)
Case Management Note  Patient Details  Name: Edward King MRN: [id number] Date of Birth: 1951/09/18  Subjective/Objective:       64yo Mr Edward King was admitted to an Observation bed on 08/29/15 per complaints of "disequalibrium", unsteady gait, blurred vision, fell at home today. Hx: prostate ca (urine is bright green from metheltrexate), HTN, Diabetes II. PCP=Dr Lelon Huh. 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 Resides with his wife who can provide transportation to appointments if he is too dizzy to drive. Has hearing aids. No home assistive equipment and no history of having home health services or a stay in rehab. Anticipate discharge within 1-2 days with outpatient physician follow-up. Case management will follow for discharge planning.    Action/Plan:   Expected Discharge Date:                  Expected Discharge Plan:     In-House Referral:     Discharge planning Services     Post Acute Care Choice:    Choice offered to:     DME Arranged:    DME Agency:     HH Arranged:    [place] Agency:     Status of Service:     Medicare Important Message Given:    Date Medicare IM Given:    Medicare IM give by:    Date Additional Medicare IM Given:    Additional Medicare Important Message give by:     If discussed at [place] of Stay Meetings, dates discussed:    Additional Comments:  Draylon Mercadel A, RN 08/29/2015, 4:18 PM

## 2015-08-29 NOTE — Progress Notes (Signed)
Dr Gouru-11m ativan IV for MRI

## 2015-08-29 NOTE — ED Notes (Signed)
Pt brought to triage via wheelchair. Pt reports for several days feeling off balance. States he has a hx of vertigo but this does not feel anything like that. Pt reports when he stands up is when it occurs. Pt has skin tear to his left forearm and elbow region for falling in to a counter tonight. Pt denies other sx except he reports he had some neck and shoulder pain when he woke up on Saturday morning. Reports he took some advil and that helped.

## 2015-08-29 NOTE — Consult Note (Signed)
..   Edward, King [id number] 1951-11-09 Edward Mango, MD  Reason for Consult: Dizziness  HPI: 64 y.o. Male presented to ED for evaluation of new onset dizziness.  He reports the dizziness is not really vertiginous in nature but more of a dysequilibrium.  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  He reports he has a history of asymmetrical hearing loss and feels the right ear has worsened recently.  He reports that in the 1980's he had an extensive workup in [place] including an ENG.  He was told he had damage to his right ear most likely from a virus at that time.  Since then, his right ear has progressively worsened in hearing and he has an appointment this next week for a recheck on his hearing.  He also has a history of temporal arteritis and is on methotrexate and prednisone.  The prednisone has recently been decreased to 2.41m per day.  The dizziness worsened to the point that he fell in the bathroom due to it.  Denies an other neurological deficits.  Allergies: No Known Allergies  ROS: Review of systems normal other than 12 systems except per HPI.  PMH:  Past Medical History  Diagnosis Date  . Diabetes   . Hypertension     FH:  Family History  Problem Relation Age of Onset  . Colon cancer Mother     SH:  Social History   Social History  . Marital Status: Married    Spouse Name: N/A  . Number of Children: N/A  . Years of Education: N/A   Occupational History  . Not on file.   Social History Main Topics  . Smoking status: Never Smoker   . Smokeless tobacco: Never Used  . Alcohol Use: No  . Drug Use: No  . Sexual Activity: Not on file   Other Topics Concern  . Not on file   Social History Narrative    PSH:  Past Surgical History  Procedure Laterality Date  . Temporal artery biopsy / ligation Left     Physical  Exam:  GEN- CN 2-12 grossly intact and symmetric. EARS- EAC/TMs normal BL.  ORAL- lips, gums, ororpharynx normal with no masses or  lesions.  EXT- Skin warm and dry.  NOSE- Nasal cavity without polyps or purulence. External nose and ears without masses or lesions. EYES- EOMI, PERRLA.  NECK- supple with no masses or lesions. No lymphadenopathy palpated. Thyroid normal with no masses.  Limited HChristella Noais negative for rotary nystagmus   A/P: Dizziness with history of asymmetrical hearing loss and previous episode of dizziness and possible vestibulopathy.  Possibly Viral Neuronitis versus M[place]ires versus TIA versus Vestibular Neuroma given history of asymmetrical hearing loss that is worsening and dizziness.  Recs:   1)  Agree with MRI and Carotid dopplers 2)  Agree with PT consult for formal HChristella Noaand Epley if needed 3)  Increase steroid to 629mtaper over 5 days then return to 2.9m54m)  PRN Antivert. 5)  Depending on symptoms and MRI results.  Follow up as outpatient for formal Vestibular examination and possible repeat VNG and repeat Hearing test.   Edward King 08/29/2015 11:30 AM

## 2015-08-29 NOTE — H&P (Signed)
Edward King is an 64 y.o. male.   Chief Complaint: Imbalance HPI: Patient presents emergency department complaining of imbalance and subsequent fall today. He admits that he "has not felt himself" for at least 3 days. Specifically, he feels as if his vision becomes blurry intermittently and that sometimes "you reach out to lean on a wall but it moves away from you". He admits to having a "head cold" and feeling congested but denies feelings of disequilibrium after blowing his nose, leaning forward or standing from a seated position. His wife denies any facial asymmetry or speech changes. Exam by emergency department staff revealed some dysmetria and unsteadiness on his feet which prompted them to call hospitalist service for admission.  Past Medical History  Diagnosis Date  . Diabetes   . Hypertension     Past Surgical History  Procedure Laterality Date  . Temporal artery biopsy / ligation Left     Family History  Problem Relation Age of Onset  . Colon cancer Mother    Social History:  reports that he has never smoked. He has never used smokeless tobacco. He reports that he does not drink alcohol or use illicit drugs.  Allergies: No Known Allergies  Prior to Admission medications   Medication Sig Start Date End Date Taking? Authorizing Provider  citalopram (CELEXA) 20 MG tablet Take 30 mg by mouth daily.    Yes Historical Provider, MD  folic acid (FOLVITE) 1 MG tablet Take by mouth.   Yes Historical Provider, MD  gabapentin (NEURONTIN) 300 MG capsule TAKE 2 CAPSULES BY MOUTH IN THE MORNING AND 3 CAPSULES AT BEDTIME 08/24/15  Yes Birdie Sons, MD  glipiZIDE (GLUCOTROL) 5 MG tablet Take 2.5 mg by mouth daily.    Yes Historical Provider, MD  metFORMIN (GLUCOPHAGE) 500 MG tablet Take 500 mg by mouth 2 (two) times daily with a meal.   Yes Historical Provider, MD  Meth-Hyo-M Bl-Na Phos-Ph Sal (URIBEL) 118 MG CAPS Take 118 mg by mouth 4 (four) times daily.   Yes Historical Provider, MD   methotrexate 2.5 MG tablet Take 20 mg by mouth once a week.    Yes Historical Provider, MD  predniSONE (DELTASONE) 10 MG tablet 2.5 mg. Tapered dose as directed   Yes Historical Provider, MD  tamsulosin (FLOMAX) 0.4 MG CAPS capsule Take 0.4 mg by mouth daily after supper.   Yes Historical Provider, MD  valsartan-hydrochlorothiazide (DIOVAN-HCT) 80-12.5 MG per tablet Take 1 tablet by mouth daily.   Yes Historical Provider, MD  amoxicillin-clavulanate (AUGMENTIN) 875-125 MG per tablet Take 1 tablet by mouth 2 (two) times daily. 06/15/15   Carmon Ginsberg, PA  etodolac (LODINE) 500 MG tablet Take 500 mg by mouth 2 (two) times daily.    Historical Provider, MD  famotidine (PEPCID) 20 MG tablet One at bedtime Patient not taking: Reported on 06/17/2015 02/27/13   Tanda Rockers, MD  mesalamine (LIALDA) 1.2 G EC tablet Take 1,200 mg by mouth 2 (two) times daily.    Historical Provider, MD  nebivolol (BYSTOLIC) 5 MG tablet Take 1 tablet (5 mg total) by mouth daily. Patient not taking: Reported on 06/17/2015 02/27/13   Tanda Rockers, MD  pantoprazole (PROTONIX) 40 MG tablet Take 1 tablet (40 mg total) by mouth daily. Take 30-60 min before first meal of the day Patient not taking: Reported on 06/17/2015 02/27/13   Tanda Rockers, MD     Results for orders placed or performed during the hospital encounter of 08/29/15 (from  the past 48 hour(s))  Basic metabolic panel     Status: Abnormal   Collection Time: 08/29/15  3:22 AM  Result Value Ref Range   Sodium 142 135 - 145 mmol/L   Potassium 4.1 3.5 - 5.1 mmol/L   Chloride 105 101 - 111 mmol/L   CO2 31 22 - 32 mmol/L   Glucose, Bld 108 (H) 65 - 99 mg/dL   BUN 21 (H) 6 - 20 mg/dL   Creatinine, Ser 0.87 0.61 - 1.24 mg/dL   Calcium 9.2 8.9 - 10.3 mg/dL   GFR calc non Af Amer >60 >60 mL/min   GFR calc Af Amer >60 >60 mL/min    Comment: (NOTE) The eGFR has been calculated using the CKD EPI equation. This calculation has not been validated in all clinical  situations. eGFR's persistently <60 mL/min signify possible Chronic Kidney Disease.    Anion gap 6 5 - 15  CBC     Status: Abnormal   Collection Time: 08/29/15  3:22 AM  Result Value Ref Range   WBC 6.3 3.8 - 10.6 K/uL   RBC 4.59 4.40 - 5.90 MIL/uL   Hemoglobin 13.8 13.0 - 18.0 g/dL   HCT 41.8 40.0 - 52.0 %   MCV 91.0 80.0 - 100.0 fL   MCH 30.0 26.0 - 34.0 pg   MCHC 33.0 32.0 - 36.0 g/dL   RDW 15.5 (H) 11.5 - 14.5 %   Platelets 190 150 - 440 K/uL  Urinalysis complete, with microscopic ([facility] only)     Status: Abnormal   Collection Time: 08/29/15  3:22 AM  Result Value Ref Range   Color, Urine BLUE (A) YELLOW   APPearance CLEAR (A) CLEAR   Glucose, UA NEGATIVE NEGATIVE mg/dL   Bilirubin Urine NEGATIVE NEGATIVE   Ketones, ur NEGATIVE NEGATIVE mg/dL   Specific Gravity, Urine 1.018 1.005 - 1.030   Hgb urine dipstick NEGATIVE NEGATIVE   pH 5.0 5.0 - 8.0   Protein, ur NEGATIVE NEGATIVE mg/dL   Nitrite NEGATIVE NEGATIVE   Leukocytes, UA NEGATIVE NEGATIVE   RBC / HPF NONE SEEN 0 - 5 RBC/hpf   WBC, UA NONE SEEN 0 - 5 WBC/hpf   Bacteria, UA NONE SEEN NONE SEEN   Squamous Epithelial / LPF 0-5 (A) NONE SEEN   Mucous PRESENT   Troponin I     Status: None   Collection Time: 08/29/15  3:22 AM  Result Value Ref Range   Troponin I <0.03 <0.031 ng/mL    Comment:        NO INDICATION OF MYOCARDIAL INJURY.    Ct Head Wo Contrast  08/29/2015   CLINICAL DATA:  Dizziness and vertigo. Patient is felt off balance for several days. Patient fell to a counter tonight. No history of head trauma.  EXAM: CT HEAD WITHOUT CONTRAST  TECHNIQUE: Contiguous axial images were obtained from the base of the skull through the vertex without intravenous contrast.  COMPARISON:  12/29/2014  FINDINGS: Ventricles and sulci appear symmetrical. No mass effect or midline shift. No abnormal extra-axial fluid collections. Gray-white matter junctions are distinct. Basal cisterns are not effaced. No evidence of acute  intracranial hemorrhage. No depressed skull fractures. Visualized paranasal sinuses and mastoid air cells are not opacified.  IMPRESSION: No acute intracranial abnormalities.   Electronically Signed   By: Lucienne Capers M.D.   On: 08/29/2015 03:58    Review of Systems  Constitutional: Negative for fever and chills.  HENT: Negative for sore throat and tinnitus.  Eyes: Negative for blurred vision and redness.  Respiratory: Negative for cough and shortness of breath.   Cardiovascular: Negative for chest pain, palpitations, orthopnea and PND.  Gastrointestinal: Negative for nausea, vomiting, abdominal pain and diarrhea.  Genitourinary: Negative for dysuria, urgency and frequency.  Musculoskeletal: Positive for falls. Negative for myalgias and joint pain.  Skin: Negative for rash.       No lesions  Neurological: Negative for speech change, focal weakness and weakness.       Disequilibrium  Endo/Heme/Allergies: Does not bruise/bleed easily.       No temperature intolerance  Psychiatric/Behavioral: Negative for depression and suicidal ideas.    Blood pressure 145/90, pulse 79, temperature 97.8 F (36.6 C), temperature source Oral, resp. rate 17, height 5' 11" (1.803 m), weight 130.636 kg (288 lb), SpO2 97 %. Physical Exam  Nursing note and vitals reviewed. Constitutional: He is oriented to person, place, and time. He appears well-developed and well-nourished.  HENT:  Head: Normocephalic.  Mouth/Throat: Oropharynx is clear and moist.  Eyes: Conjunctivae and EOM are normal. Pupils are equal, round, and reactive to light. No scleral icterus.  Neck: Normal range of motion. Neck supple. No JVD present. No tracheal deviation present. No thyromegaly present.  Cardiovascular: Normal rate, regular rhythm and normal heart sounds.  Exam reveals no gallop and no friction rub.   No murmur heard. Respiratory: Effort normal and breath sounds normal. No respiratory distress.  GI: Soft. Bowel sounds are  normal. He exhibits no distension.  Genitourinary:  Deferred  Musculoskeletal: Normal range of motion.  Lymphedema extremities bilaterally  Lymphadenopathy:    He has no cervical adenopathy.  Neurological: He is alert and oriented to person, place, and time. He has normal strength. No cranial nerve deficit or sensory deficit.  No dysdiadochokinesis; normal heel to shin testing bilaterally  Skin: Skin is warm and dry. No rash noted. No erythema.  Skin tear left forearm clean and dressed  Psychiatric: He has a normal mood and affect. His behavior is normal. Judgment and thought content normal.     Assessment/Plan This is a 64 year old Caucasian male admitted for disequilibrium. 1. Disequilibrium: Differential diagnosis includes stroke and TIA as well as his congestion or labyrinthitis. The patient has no dysmetria on my exam although he is somewhat shaky with fine motor activity. He has no focal neurologic deficits. CT scan of his head is negative for ischemic infarct or hemorrhagic stroke. I have ordered a neurology consult and MRI of the brain although CTA of his intra-and extracranial vessels may be the next step given his history of temporal arteritis. He does not have headache or vision changes at this time. The patient is currently on methotrexate and prednisone. I have started the patient on aspirin as well. 2. Hypertension: Continue Diovan and labetalol 3. Diabetes mellitus type 2: Hold metformin and oral hypoglycemics. I place the patient on sliding-scale insulin while hospitalized 4. Prostate cancer: Continue Uribel; status post recent brachytherapy 5. DVT prophylaxis: Heparin 6. GI prophylaxis: None The patient is a full code. Time spent on admission orders and patient care approximately 35 minutes  Harrie Foreman 08/29/2015, 6:05 AM

## 2015-08-29 NOTE — Evaluation (Signed)
Physical Therapy Evaluation Patient Details Name: Edward King MRN: [id number] DOB: Apr 20, 1951 Today's Date: 08/29/2015   History of Present Illness  Pt started having some dizziness/losses of balance a few days ago.  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  He is able to do some mid level balance acts, but often needs some finger tip assist and shows some unsteadiness with this as well.  He does report that generally he is much more stable today than yesterday and feels like he can go home as things continue to clear up. Pt will likely benefit from seeing vestibular specific outpt PT.      Follow Up Recommendations Outpatient PT (with vestibular focus)    Equipment Recommendations       Recommendations for Other Services       Precautions / Restrictions Precautions Precautions: Fall Restrictions Weight Bearing Restrictions: No      Mobility  Bed Mobility Overal bed mobility: Independent             General bed mobility comments: Pt able to move in bed w/o needing any assist  Transfers Overall transfer level: Independent               General transfer comment: Pt feeling a little unsteady on first getting up, but ultimately does well and has no real LOBs.   Ambulation/Gait Ambulation/Gait assistance: Supervision Ambulation Distance (Feet): 200 Feet Assistive device: None       General Gait Details: Pt has a few small stagger steps and reports being much slower than his normal but ultimately he has no real loss of balance. He feels safe to go home and that he is much better than yesterday though he is still "off"  Science writer    Modified Rankin (Stroke Patients Only)       Balance Overall balance assessment: Modified Independent                              High Level Balance Comments: pt was able to do some heel raises, single leg stance, tandem stance and eyes closed/feet together acts but does need finger tip assist for most of these and has some wobble though no major LOBs during activities             Pertinent Vitals/Pain Pain Assessment: No/denies pain    Home Living Family/patient expects to be discharged to:: Private residence Living Arrangements: Spouse/significant other Available Help at Discharge: Family Type of Home: House Home Access: Stairs to enter   Technical brewer of Steps: 1          Prior Function Level of Independence: Independent         Comments: pt is able to be out and active, generally has no issues     Hand Dominance        Extremity/Trunk Assessment   Upper Extremity Assessment: Overall WFL for tasks assessed           Lower Extremity Assessment: Overall WFL for tasks assessed         Communication   Communication: No difficulties  Cognition Arousal/Alertness: Awake/alert Behavior During Therapy: WFL for tasks assessed/performed (pt a little "off" from meds)  Overall Cognitive Status: Within Functional Limits for tasks assessed                      General Comments General comments (skin integrity, edema, etc.): pt with negative Dix-Hallpike bilaterally, he does not describe any BPPV-type vertigo symptoms    Exercises        Assessment/Plan    PT Assessment Patient needs continued PT services  PT Diagnosis Abnormality of gait   PT Problem List Decreased activity tolerance;Decreased balance;Decreased safety awareness;Decreased coordination  PT Treatment Interventions Gait training;Balance training;Neuromuscular re-education;Therapeutic exercise;Therapeutic activities;Functional mobility training   PT Goals (Current goals can be found in the Care Plan section) Acute Rehab PT Goals Patient Stated Goal: "I just want to get  feeling back to normal" PT Goal Formulation: With patient/family Time For Goal Achievement: 09/12/15 Potential to Achieve Goals: Good    Frequency Min 2X/week   Barriers to discharge        Co-evaluation               End of Session Equipment Utilized During Treatment: Gait belt Activity Tolerance: Patient tolerated treatment well Patient left: with bed alarm set      Functional Assessment Tool Used:  (clinical judgement) Functional Limitation: Mobility: Walking and moving around Mobility: Walking and Moving Around Current Status (K8159): At least 1 percent but less than 20 percent impaired, limited or restricted Mobility: Walking and Moving Around Goal Status [phone]): 0 percent impaired, limited or restricted    Time: 1329-1350 PT Time Calculation (min) (ACUTE ONLY): 21 min   Charges:   PT Evaluation $Initial PT Evaluation Tier I: 1 Procedure     PT G Codes:   PT G-Codes **NOT FOR INPATIENT CLASS** Functional Assessment Tool Used:  (clinical judgement) Functional Limitation: Mobility: Walking and moving around Mobility: Walking and Moving Around Current Status (J5183): At least 1 percent but less than 20 percent impaired, limited or restricted Mobility: Walking and Moving Around Goal Status [phone]): 0 percent impaired, limited or restricted   Wayne Both, PT, DPT [phone]  Kreg Shropshire 08/29/2015, 5:06 PM

## 2015-08-29 NOTE — Consult Note (Signed)
CC: imbalance/fall  HPI: Edward King is an 64 y.o. male  presentscomplaining of imbalance and subsequent fall today. He admits that he "has not felt himself" for at least 3 days.  Symptoms worse in past day. Described as imbalance that is positional. Pt has similar symptoms in the past.   Past Medical History  Diagnosis Date  . Diabetes   . Hypertension     Past Surgical History  Procedure Laterality Date  . Temporal artery biopsy / ligation Left     Family History  Problem Relation Age of Onset  . Colon cancer Mother     Social History:  reports that he has never smoked. He has never used smokeless tobacco. He reports that he does not drink alcohol or use illicit drugs.  No Known Allergies  Medications: I have reviewed the patient's current medications.  ROS: History obtained from chart review  General ROS: negative for - chills, fatigue, fever, night sweats, weight gain or weight loss Psychological ROS: negative for - behavioral disorder, hallucinations, memory difficulties, mood swings or suicidal ideation Ophthalmic ROS: negative for - blurry vision, double vision, eye pain or loss of vision ENT ROS: negative for - epistaxis, nasal discharge, oral lesions, sore throat, tinnitus or vertigo Allergy and Immunology ROS: negative for - hives or itchy/watery eyes Hematological and Lymphatic ROS: negative for - bleeding problems, bruising or swollen lymph nodes Endocrine ROS: negative for - galactorrhea, hair pattern changes, polydipsia/polyuria or temperature intolerance Respiratory ROS: negative for - cough, hemoptysis, shortness of breath or wheezing Cardiovascular ROS: negative for - chest pain, dyspnea on exertion, edema or irregular heartbeat Gastrointestinal ROS: negative for - abdominal pain, diarrhea, hematemesis, nausea/vomiting or stool incontinence Genito-Urinary ROS: negative for - dysuria, hematuria, incontinence or urinary frequency/urgency Musculoskeletal  ROS: negative for - joint swelling or muscular weakness Neurological ROS: as noted in HPI Dermatological ROS: negative for rash and skin lesion changes  Physical Examination: Blood pressure 126/66, pulse 69, temperature 97.8 F (36.6 C), temperature source Oral, resp. rate 18, height 5' 11"  (1.803 m), weight 130.636 kg (288 lb), SpO2 97 %.   Neurological Examination Mental Status: Alert, oriented, thought content appropriate.  Speech fluent without evidence of aphasia.  Able to follow 3 step commands without difficulty. Cranial Nerves: II: Discs flat bilaterally; Visual fields grossly normal, pupils equal, round, reactive to light and accommodation III,IV, VI: ptosis not present, extra-ocular motions intact bilaterally V,VII: smile symmetric, facial light touch sensation normal bilaterally VIII: hearing normal bilaterally IX,X: gag reflex present XI: bilateral shoulder shrug XII: midline tongue extension Motor: Right : Upper extremity   5/5    Left:     Upper extremity   5/5  Lower extremity   5/5     Lower extremity   5/5 Tone and bulk:normal tone throughout; no atrophy noted Sensory: Pinprick and light touch intact throughout, bilaterally Deep Tendon Reflexes: 2+ and symmetric throughout Plantars: Right: downgoing   Left: downgoing Cerebellar: normal finger-to-nose, normal rapid alternating movements and normal heel-to-shin test Gait: not tested      Laboratory Studies:   Basic Metabolic Panel:  Recent Labs Lab 08/29/15 0322  NA 142  K 4.1  CL 105  CO2 31  GLUCOSE 108*  BUN 21*  CREATININE 0.87  CALCIUM 9.2    Liver Function Tests: No results for input(s): AST, ALT, ALKPHOS, BILITOT, PROT, ALBUMIN in the last 168 hours. No results for input(s): LIPASE, AMYLASE in the last 168 hours. No results for input(s): AMMONIA in  the last 168 hours.  CBC:  Recent Labs Lab 08/29/15 0322  WBC 6.3  HGB 13.8  HCT 41.8  MCV 91.0  PLT 190    Cardiac  Enzymes:  Recent Labs Lab 08/29/15 0322  TROPONINI <0.03    BNP: Invalid input(s): POCBNP  CBG:  Recent Labs Lab 08/29/15 0740 08/29/15 1120  GLUCAP 94 116*    Microbiology: No results found for this or any previous visit.  Coagulation Studies: No results for input(s): LABPROT, INR in the last 72 hours.  Urinalysis:  Recent Labs Lab 08/29/15 0322  COLORURINE BLUE*  LABSPEC 1.018  PHURINE 5.0  GLUCOSEU NEGATIVE  HGBUR NEGATIVE  BILIRUBINUR NEGATIVE  KETONESUR NEGATIVE  PROTEINUR NEGATIVE  NITRITE NEGATIVE  LEUKOCYTESUR NEGATIVE    Lipid Panel:     Component Value Date/Time   CHOL 174 08/30/2014 0450   TRIG 73 08/30/2014 0450   HDL 40 08/30/2014 0450   VLDL 15 08/30/2014 0450   LDLCALC 119* 08/30/2014 0450    HgbA1C:  Lab Results  Component Value Date   HGBA1C 6.0 08/30/2014    Urine Drug Screen:  No results found for: LABOPIA, COCAINSCRNUR, LABBENZ, AMPHETMU, THCU, LABBARB  Alcohol Level: No results for input(s): ETH in the last 168 hours.  Other results: EKG: normal EKG, normal sinus rhythm, unchanged from previous tracings.  Imaging: Ct Head Wo Contrast  08/29/2015   CLINICAL DATA:  Dizziness and vertigo. Patient is felt off balance for several days. Patient fell to a counter tonight. No history of head trauma.  EXAM: CT HEAD WITHOUT CONTRAST  TECHNIQUE: Contiguous axial images were obtained from the base of the skull through the vertex without intravenous contrast.  COMPARISON:  12/29/2014  FINDINGS: Ventricles and sulci appear symmetrical. No mass effect or midline shift. No abnormal extra-axial fluid collections. Gray-white matter junctions are distinct. Basal cisterns are not effaced. No evidence of acute intracranial hemorrhage. No depressed skull fractures. Visualized paranasal sinuses and mastoid air cells are not opacified.  IMPRESSION: No acute intracranial abnormalities.   Electronically Signed   By: Lucienne Capers M.D.   On: 08/29/2015  03:58     Assessment/Plan:  64 y.o. male  presentscomplaining of imbalance and subsequent fall today. He admits that he "has not felt himself" for at least 3 days.  Symptoms worse in past day. Described as imbalance that is positional. Pt has similar symptoms in the past.   Symptoms are positional.  Imaging shows no abnormalities Symptoms have improved Likely BPPV similar to the past symptoms that pt has had States feeling better Trial of meclizine Likely d/c planning. Leotis Pain  08/29/2015, 12:18 PM

## 2015-08-29 NOTE — ED Provider Notes (Signed)
[facility] Emergency Department Provider Note  ____________________________________________  Time seen: Approximately 3:14 AM  I have reviewed the triage vital signs and the nursing notes.   HISTORY  Chief Complaint Dizziness    HPI Edward King is a 64 y.o. male with history of hypertension, prostate cancer, diabetes, lymphedema, temporal arteritis, who presents for evaluation of ataxia, gradual onset, intermittent, worse over the past 2 days. He reports that when he stands up and tries to walk around he feels as if he is falling to the right. He generally feels off balance on his feet. He denies any room spinning dizziness. He reports he feels mildly lightheaded with position change but this happens infrequently. Current severity of symptoms is moderate. No modifying factors. No chest pain, difficulty breathing, numbness, weakness, no vomiting, diarrhea, fevers or chills. No recent change in vision, no headache.   Past Medical History  Diagnosis Date  . Diabetes   . Hypertension     Patient Active Problem List   Diagnosis Date Noted  . Disequilibrium 08/29/2015  . H/O cardiovascular disorder 06/16/2015  . 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 BP (high blood pressure) 06/16/2015  . Blood in sputum 06/16/2015  . Acquired lymphedema 06/16/2015  . CA of prostate 06/16/2015  . Obstructive sleep apnea 06/16/2015  . Cranial arteritis 06/15/2015  . Diabetes 05/06/2014  . Apnea, sleep 05/06/2014  . Cough 02/28/2013  . Peripheral edema 02/28/2013  . HBP (high blood pressure) 02/28/2013    Past Surgical History  Procedure Laterality Date  . Temporal artery biopsy / ligation Left     Current Outpatient Rx  Name  Route  Sig  Dispense  Refill  . citalopram (CELEXA) 20 MG tablet   Oral   Take 30 mg by mouth daily.          . folic acid (FOLVITE) 1 MG tablet   Oral   Take by mouth.         . gabapentin (NEURONTIN) 300 MG capsule      TAKE 2 CAPSULES BY  MOUTH IN THE MORNING AND 3 CAPSULES AT BEDTIME   150 capsule   5   . glipiZIDE (GLUCOTROL) 5 MG tablet   Oral   Take 2.5 mg by mouth daily.          . metFORMIN (GLUCOPHAGE) 500 MG tablet   Oral   Take 500 mg by mouth 2 (two) times daily with a meal.         . Meth-Hyo-M Bl-Na Phos-Ph Sal (URIBEL) 118 MG CAPS   Oral   Take 118 mg by mouth 4 (four) times daily.         . methotrexate 2.5 MG tablet   Oral   Take 20 mg by mouth once a week.          . predniSONE (DELTASONE) 10 MG tablet      2.5 mg. Tapered dose as directed         . tamsulosin (FLOMAX) 0.4 MG CAPS capsule   Oral   Take 0.4 mg by mouth daily after supper.         . valsartan-hydrochlorothiazide (DIOVAN-HCT) 80-12.5 MG per tablet   Oral   Take 1 tablet by mouth daily.         Marland Kitchen amoxicillin-clavulanate (AUGMENTIN) 875-125 MG per tablet   Oral   Take 1 tablet by mouth 2 (two) times daily.   20 tablet   0   . etodolac (LODINE) 500 MG  tablet   Oral   Take 500 mg by mouth 2 (two) times daily.         . famotidine (PEPCID) 20 MG tablet      One at bedtime Patient not taking: Reported on 06/17/2015   30 tablet   11   . mesalamine (LIALDA) 1.2 G EC tablet   Oral   Take 1,200 mg by mouth 2 (two) times daily.         . nebivolol (BYSTOLIC) 5 MG tablet   Oral   Take 1 tablet (5 mg total) by mouth daily. Patient not taking: Reported on 06/17/2015   30 tablet   0   . pantoprazole (PROTONIX) 40 MG tablet   Oral   Take 1 tablet (40 mg total) by mouth daily. Take 30-60 min before first meal of the day Patient not taking: Reported on 06/17/2015   30 tablet   2     Allergies Review of patient's allergies indicates no known allergies.  Family History  Problem Relation Age of Onset  . Colon cancer Mother     Social History Social History  Substance Use Topics  . Smoking status: Never Smoker   . Smokeless tobacco: Never Used  . Alcohol Use: No    Review of  Systems Constitutional: No fever/chills Eyes: No visual changes. ENT: No sore throat. Cardiovascular: Denies chest pain. Respiratory: Denies shortness of breath. Gastrointestinal: No abdominal pain.  No nausea, no vomiting.  No diarrhea.  No constipation. Genitourinary: Negative for dysuria. Musculoskeletal: Negative for back pain. Skin: Negative for rash. Neurological: Negative for headaches, focal weakness or numbness.  10-point ROS otherwise negative.  ____________________________________________   PHYSICAL EXAM:  VITAL SIGNS: ED Triage Vitals  Enc Vitals Group     BP 08/29/15 0243 137/80 mmHg     Pulse Rate 08/29/15 0243 75     Resp 08/29/15 0243 18     Temp 08/29/15 0243 97.8 F (36.6 C)     Temp Source 08/29/15 0243 Oral     SpO2 08/29/15 0243 96 %     Weight 08/29/15 0243 288 lb (130.636 kg)     Height 08/29/15 0243 5' 11"  (1.803 m)     Head Cir --      Peak Flow --      Pain Score 08/29/15 0251 4     Pain Loc --      Pain Edu? --      Excl. in [place]? --     Constitutional: Alert and oriented. Well appearing and in no acute distress. Eyes: Conjunctivae are normal. PERRL. EOMI. Head: Atraumatic. Nose: No congestion/rhinnorhea. Mouth/Throat: Mucous membranes are moist.  Oropharynx non-erythematous. Neck: No stridor.  Cardiovascular: Normal rate, regular rhythm. Grossly normal heart sounds.  Good peripheral circulation. Respiratory: Normal respiratory effort.  No retractions. Lungs CTAB. Gastrointestinal: Soft and nontender. No distention. No abdominal bruits. No CVA tenderness. Genitourinary: deferred Musculoskeletal: Moderate nonpitting edema bilateral lower extremities.  No joint effusions. Neurologic:  Normal speech and language. 5 out of 5 strength in bilateral upper and lower extremities, sensation intact to light touch throughout, cranial nerves II through XII intact, mild dysmetria on the right on finger-nose-finger, with ambulation the patient stumbles to  the right nearly falling into the wall Skin:  Skin is warm, dry and intact. No rash noted. Psychiatric: Mood and affect are normal. Speech and behavior are normal.  ____________________________________________   LABS (all labs ordered are listed, but only abnormal results are displayed)  Labs  Reviewed  BASIC METABOLIC PANEL - Abnormal; Notable for the following:    Glucose, Bld 108 (*)    BUN 21 (*)    All other components within normal limits  CBC - Abnormal; Notable for the following:    RDW 15.5 (*)    All other components within normal limits  URINALYSIS COMPLETEWITH MICROSCOPIC ([facility] ONLY) - Abnormal; Notable for the following:    Color, Urine BLUE (*)    APPearance CLEAR (*)    Squamous Epithelial / LPF 0-5 (*)    All other components within normal limits  TROPONIN I  CBG MONITORING, ED   ____________________________________________  EKG  ED ECG REPORT I, Joanne Gavel, the attending physician, personally viewed and interpreted this ECG.   Date: 08/29/2015  EKG Time: 03:09  Rate: 68  Rhythm: normal sinus rhythm  Axis: Normal  Intervals:right bundle branch block  ST&T Change: No acute ST segment change.  ____________________________________________  RADIOLOGY  CT head FINDINGS: Ventricles and sulci appear symmetrical. No mass effect or midline shift. No abnormal extra-axial fluid collections. Gray-white matter junctions are distinct. Basal cisterns are not effaced. No evidence of acute intracranial hemorrhage. No depressed skull fractures. Visualized paranasal sinuses and mastoid air cells are not opacified.  IMPRESSION: No acute intracranial abnormalities.  ____________________________________________   PROCEDURES  Procedure(s) performed: None  Critical Care performed: No  ____________________________________________   INITIAL IMPRESSION / ASSESSMENT AND PLAN / ED COURSE  Pertinent labs & imaging results that were available during my care  of the patient were reviewed by me and considered in my medical decision making (see chart for details).  Edward King is a 64 y.o. male with history of hypertension, prostate cancer, diabetes, lymphedema, temporal arteritis, who presents for evaluation of ataxia, gradual onset, intermittent, worse over the past 2 days. On exam, he is nontoxic appearing and in no acute distress. Vital signs stable, he is afebrile. Exam is concerning for faint right-sided dysmetria on finger-nose-finger as well as mild ataxia with him falling to the right with ambulation. My concern is for acute cerebellar infarct which he is at risk for secondary to his multiple comorbidities/risk factors for cerebrovascular disease. Labs reviewed, normal BMP, normal CBC, urinalysis are consistent with infection. CT head negative for any acute cardio process. Aspirin ordered. He is outside of the time window for tPA/not a candidate. Case discussed with Dr. Marcille Blanco, hospitalist, for admission at 4:30 AM. Aspirin ordered. ____________________________________________   FINAL CLINICAL IMPRESSION(S) / ED DIAGNOSES  Final diagnoses:  Ataxia      Joanne Gavel, MD 08/29/15 [phone]

## 2015-08-29 NOTE — Progress Notes (Signed)
[place] [facility] [place] NAME: Edward King    MR#:  [id number]  DATE OF BIRTH:  [birth year]  SUBJECTIVE:  CHIEF COMPLAINT:   Resting comfortably but reporting dizziness and swaying to one side while walking which has been progressively getting worse. Denies any dysphagia or speech difficulties. Denies any  headache or extremity weakness weakness REVIEW OF SYSTEMS:  CONSTITUTIONAL: No fever, fatigue or weakness.  EYES: No blurred or double vision.  EARS, NOSE, AND THROAT: No tinnitus or ear pain. Reporting congestion RESPIRATORY: No cough, shortness of breath, wheezing or hemoptysis.  CARDIOVASCULAR: No chest pain, orthopnea, edema.  GASTROINTESTINAL: No nausea, vomiting, diarrhea or abdominal pain.  GENITOURINARY: No dysuria, hematuria.  ENDOCRINE: No polyuria, nocturia,  HEMATOLOGY: No anemia, easy bruising or bleeding SKIN: No rash or lesion. MUSCULOSKELETAL: No joint pain or arthritis.   NEUROLOGIC: No tingling, numbness, weakness. Reporting dizziness PSYCHIATRY: No anxiety or depression.   DRUG ALLERGIES:  No Known Allergies  VITALS:  Blood pressure 126/66, pulse 69, temperature 97.8 F (36.6 C), temperature source Oral, resp. rate 18, height 5' 11"  (1.803 m), weight 130.636 kg (288 lb), SpO2 97 %.  PHYSICAL EXAMINATION:  GENERAL:  64 y.o.-year-old patient lying in the bed with no acute distress.  EYES: Pupils equal, round, reactive to light and accommodation. No scleral icterus. Extraocular muscles intact.  HEENT: Head atraumatic, normocephalic. Oropharynx clear and nasopharynx congested. No sinus tenderness  NECK:  Supple, no jugular venous distention. No thyroid enlargement, no tenderness.  LUNGS: Normal breath sounds bilaterally, no wheezing, rales,rhonchi or crepitation. No use of accessory muscles of respiration.  CARDIOVASCULAR: S1, S2 normal. No murmurs, rubs, or gallops.  ABDOMEN: Soft, nontender, nondistended. Bowel  sounds present. No organomegaly or mass.  EXTREMITIES: No pedal edema, cyanosis, or clubbing.  NEUROLOGIC: Cranial nerves II through XII are intact. Muscle strength 5/5 in all extremities. Sensation intact. Gait not checked. No rotary nystagmus PSYCHIATRIC: The patient is alert and oriented x 3.  SKIN: No obvious rash, lesion, or ulcer.    LABORATORY PANEL:   CBC  Recent Labs Lab 08/29/15 0322  WBC 6.3  HGB 13.8  HCT 41.8  PLT 190   ------------------------------------------------------------------------------------------------------------------  Chemistries   Recent Labs Lab 08/29/15 0322  NA 142  K 4.1  CL 105  CO2 31  GLUCOSE 108*  BUN 21*  CREATININE 0.87  CALCIUM 9.2   ------------------------------------------------------------------------------------------------------------------  Cardiac Enzymes  Recent Labs Lab 08/29/15 0322  TROPONINI <0.03   ------------------------------------------------------------------------------------------------------------------  RADIOLOGY:  Ct Head Wo Contrast  08/29/2015   CLINICAL DATA:  Dizziness and vertigo. Patient is felt off balance for several days. Patient fell to a counter tonight. No history of head trauma.  EXAM: CT HEAD WITHOUT CONTRAST  TECHNIQUE: Contiguous axial images were obtained from the base of the skull through the vertex without intravenous contrast.  COMPARISON:  12/29/2014  FINDINGS: Ventricles and sulci appear symmetrical. No mass effect or midline shift. No abnormal extra-axial fluid collections. Gray-white matter junctions are distinct. Basal cisterns are not effaced. No evidence of acute intracranial hemorrhage. No depressed skull fractures. Visualized paranasal sinuses and mastoid air cells are not opacified.  IMPRESSION: No acute intracranial abnormalities.   Electronically Signed   By: Lucienne Capers M.D.   On: 08/29/2015 03:58   Mr Brain Wo Contrast  08/29/2015   CLINICAL DATA:  Gait  disturbance and fall yesterday. Diagnosis of prostate cancer 4 months ago.  EXAM: MRI HEAD WITHOUT CONTRAST  TECHNIQUE:  Multiplanar, multiecho pulse sequences of the brain and surrounding structures were obtained without intravenous contrast.  COMPARISON:  CT head without contrast from the same day. CTA of the neck 12/29/2014  FINDINGS: No acute infarct, hemorrhage, or mass lesion is present. The ventricles are of normal size. No significant extraaxial fluid collection is present.  No significant white matter disease is present. The basal ganglia and brainstem are intact. The internal auditory canals are within normal limits.  Flow is present in the major intracranial arteries. Globes and orbits are intact.  Mild mucosal thickening is present in the ethmoid air cells bilaterally. Minimal fluid is present in the mastoid air cells.  The skullbase is within normal limits. Midline structures are unremarkable.  IMPRESSION: Negative MRI of the brain.   Electronically Signed   By: San Morelle M.D.   On: 08/29/2015 12:33    EKG:   Orders placed or performed during the hospital encounter of 08/29/15  . ED EKG  . ED EKG    ASSESSMENT AND PLAN:  Pt is coming with dizziness with history of asymmetrical hearing loss and previous episode of dizziness and possible vestibulopathy.   1. Disequilibrium: Differential diagnosis includes TIA  or labyrinthitis or Viral Neuronitis versus Meneires  Vs vestibular neuroma Will increase the prednisone to 60 mg to taper over 5 days then return to 2.5 mg dose for his temporal arteritis MRA of the brain is negative Carotid Dopplers report is pending Will provide meclizine as needed basis PT consult is pending Will check orthostatics Appreciate ear nose throat consult. Outpatient follow-up with ENT for vestibular examination and hearing test is recommended Neurology consult is pending  The patient is currently on methotrexate for prostate cancer, continue  aspirin  as well.  2. Hypertension: Continue Diovan and labetalol 3. Diabetes mellitus type 2: Hold metformin and oral hypoglycemics. I place the patient on sliding-scale insulin while hospitalized 4. Prostate cancer: Continue Uribel; status post recent brachytherapy 5. DVT prophylaxis: Heparin 6. GI prophylaxis: None    All the records are reviewed and case discussed with Care Management/Social Workerr. Management plans discussed with the patient, family and they are in agreement.  CODE STATUS: Full code  TOTAL TIME TAKING CARE OF THIS PATIENT: 35 minutes.   POSSIBLE D/C IN 1-2 DAYS, DEPENDING ON CLINICAL CONDITION.   [name] M.D on 08/29/2015 at 1:32 PM  Between 7am to 6pm - Pager - [phone] After 6pm go to [email or web address] - password [email or web address] [place] [facility]  Office  [phone]  CC: Primary care physician; Lelon Huh, MD

## 2015-08-29 NOTE — Plan of Care (Addendum)
Problem: Discharge Progression Outcomes Goal: Other Discharge Outcomes/Goals Outcome: Progressing Patient has no complaints of pain. VSS. Tolerating diet, without nausea. Patient given medication for dizziness, no change noted. Walked with PT without complications. CT and MRI performed.

## 2015-08-30 LAB — GLUCOSE, CAPILLARY
Glucose-Capillary: 124 mg/dL — ABNORMAL HIGH (ref 65–99)
Glucose-Capillary: 143 mg/dL — ABNORMAL HIGH (ref 65–99)

## 2015-08-30 MED ORDER — PREDNISONE 10 MG PO TABS: 10.0000 mg | ORAL_TABLET | Freq: Every day | ORAL | Status: DC

## 2015-08-30 MED ORDER — HYDROCHLOROTHIAZIDE 12.5 MG PO CAPS: 12.5000 mg | ORAL_CAPSULE | Freq: Every day | ORAL | Status: DC

## 2015-08-30 MED ORDER — MECLIZINE HCL 25 MG PO TABS: 25.0000 mg | ORAL_TABLET | Freq: Three times a day (TID) | ORAL | Status: DC | PRN

## 2015-08-30 MED ORDER — FLUTICASONE PROPIONATE 50 MCG/ACT NA SUSP: 2.0000 | Freq: Every day | NASAL | Status: DC

## 2015-08-30 NOTE — Plan of Care (Signed)
Problem: Discharge Progression Outcomes Goal: Other Discharge Outcomes/Goals Outcome: Progressing Plan of care progress to goal: Pt complains of no pain. VSS One assist r/t disequilibrium Diet - heart healthy and carb modified

## 2015-08-30 NOTE — Discharge Instructions (Signed)
Activity as tolerated Diet low-salt and diabetic Get hearing test done on Friday as scheduled Follow-up with primary care physician in a week Follow-up with ENT Dr. Pryor Ochoa in a week  follow-up with neurology as needed Continue outpatient physical therapy

## 2015-08-30 NOTE — Progress Notes (Signed)
Patient discharged home per MD order. VSS. Prescriptions given to patient. All discharge instructions given and all questions answered. Escorted by [employer]. And wife.

## 2015-08-30 NOTE — Progress Notes (Signed)
..   08/30/2015 9:57 AM  Edward King [id number]  Hospital Day 2    Temp:  [97.9 F (36.6 C)-98 F (36.7 C)] 97.9 F (36.6 C) (09/12 0524) Pulse Rate:  [69-92] 71 (09/12 0524) Resp:  [18] 18 (09/12 0524) BP: (106-143)/(48-81) 127/68 mmHg (09/12 0524) SpO2:  [91 %-99 %] 98 % (09/12 0524) Weight:  [133.811 kg (295 lb)] 133.811 kg (295 lb) (09/12 0500),     Intake/Output Summary (Last 24 hours) at 08/30/15 0957 Last data filed at 08/30/15 0854  Gross per 24 hour  Intake    120 ml  Output   2500 ml  Net  -2380 ml    Results for orders placed or performed during the hospital encounter of 08/29/15 (from the past 24 hour(s))  Glucose, capillary     Status: Abnormal   Collection Time: 08/29/15 11:20 AM  Result Value Ref Range   Glucose-Capillary 116 (H) 65 - 99 mg/dL  Glucose, capillary     Status: Abnormal   Collection Time: 08/29/15  4:24 PM  Result Value Ref Range   Glucose-Capillary 175 (H) 65 - 99 mg/dL  Glucose, capillary     Status: Abnormal   Collection Time: 08/29/15  9:02 PM  Result Value Ref Range   Glucose-Capillary 205 (H) 65 - 99 mg/dL  Glucose, capillary     Status: Abnormal   Collection Time: 08/30/15  7:16 AM  Result Value Ref Range   Glucose-Capillary 124 (H) 65 - 99 mg/dL    SUBJECTIVE:  No acute events overnight.  Walked with PT yesterday.  MRI negative except for mild mucosal edema c/w viral infection vs allergies.  Carotid dopplers negative.  Reports episode similar in 1980s again in early 2000's as well.  Recent stress as well as increased salt recently.  No other focal neurological deficits.  No current symptoms in bed.  OBJECTIVE:  Gen- NAD Eyes- no nystamus  IMPRESSION:  Disequilibrium  PLAN:  1)  Viral neuronitis versus Meniere's Disease.  Most like Meniere's given episodic nature and asymmetrical hearing loss that has worsened.  Follow up on Friday for hearing test and evaluation.  Continue Steroid taper.  Limit Salt intake.  Consider increase  in HCTZ to 29m if symptoms persist. 2)  Allergies/viral illness- consider addition of Fluticasone or Azelastine for nasal congestion.  Rosilyn Coachman 08/30/2015, 9:57 AM

## 2015-09-07 NOTE — Discharge Summary (Signed)
[place] [facility] [employer] NAME: Edward King    MR#:  [id number]  DATE OF BIRTH:  08/27/1951  DATE OF ADMISSION:  08/29/2015 ADMITTING PHYSICIAN: Harrie Foreman, MD  DATE OF DISCHARGE: 08/30/2015 12:45 PM  PRIMARY CARE PHYSICIAN: Lelon Huh, MD    ADMISSION DIAGNOSIS:  Ataxia [R27.0]  DISCHARGE DIAGNOSIS:  Active Problems:   Disequilibrium   SECONDARY DIAGNOSIS:   Past Medical History  Diagnosis Date  . Diabetes   . Hypertension     HOSPITAL COURSE:   . 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 Outpatient follow-up with ENT for vestibular examination and hearing test is recommended Neurology - OP F/U  The patient is currently on methotrexate for prostate cancer, continue aspirin as well.  2. Hypertension: Continue Diovan and labetalol 3. Diabetes mellitus type 2: Hold metformin and oral hypoglycemics. I place the patient on sliding-scale insulin while hospitalized 4. Prostate cancer: Continue Uribel; status post recent brachytherapy 5. DVT prophylaxis: Heparin 6. GI prophylaxis: None   DISCHARGE CONDITIONS:   fair  CONSULTS OBTAINED:  Treatment Team:  Leotis Pain, MD ENT- Dr.Vaught  PROCEDURES none  DRUG ALLERGIES:  No Known Allergies  DISCHARGE MEDICATIONS:   Discharge Medication List as of 08/30/2015 12:30 PM    START taking these medications   Details  fluticasone (FLONASE) 50 MCG/ACT nasal spray Place 2 sprays into both nostrils daily., Starting 08/30/2015, Until Discontinued, Print    hydrochlorothiazide (MICROZIDE) 12.5 MG capsule Take 1 capsule (12.5 mg total) by mouth daily.,  Starting 08/30/2015, Until Discontinued, Print    meclizine (ANTIVERT) 25 MG tablet Take 1 tablet (25 mg total) by mouth 3 (three) times daily as needed for dizziness., Starting 08/30/2015, Until Discontinued, OTC    !! predniSONE (DELTASONE) 10 MG tablet Take 1 tablet (10 mg total) by mouth daily with breakfast. Take 50 mg (5 tablets) by mouth on 9/13 followed by Take 40 mg (4 tablets) by mouth on September 14 followed by Take 30 mg (3 tablets) by mouth on September 15 followed by Take 20 mg(2 tablets) b y mouth on September 16 followed by take 10 mg (1 tablet) by mouth on September 17 followed by continue taking your home medications prednisone 2.5 mg once daily, Starting 08/31/2015, Until Discontinued, Print     !! - Potential duplicate medications found. Please discuss with provider.    CONTINUE these medications which have NOT CHANGED   Details  citalopram (CELEXA) 20 MG tablet Take 30 mg by mouth daily. , Until Discontinued, Historical Med    folic acid (FOLVITE) 1 MG tablet Take by mouth., Until Discontinued, Historical Med    gabapentin (NEURONTIN) 300 MG capsule TAKE 2 CAPSULES BY MOUTH IN THE MORNING AND 3 CAPSULES AT BEDTIME, Normal    glipiZIDE (GLUCOTROL) 5 MG tablet Take 2.5 mg by mouth daily. , Until Discontinued, Historical Med    metFORMIN (GLUCOPHAGE) 500 MG tablet Take 500 mg by mouth 2 (two) times daily with a meal., Until Discontinued, Historical Med    Meth-Hyo-M Bl-Na Phos-Ph Sal (URIBEL) 118 MG CAPS Take 118 mg by mouth 4 (four) times daily., Until Discontinued, Historical Med    methotrexate 2.5  MG tablet Take 20 mg by mouth once a week. , Until Discontinued, Historical Med    !! predniSONE (DELTASONE) 10 MG tablet 2.5 mg. Tapered dose as directed, Until Discontinued, Historical Med    tamsulosin (FLOMAX) 0.4 MG CAPS capsule Take 0.4 mg by mouth daily after supper., Until Discontinued, Historical Med    valsartan-hydrochlorothiazide (DIOVAN-HCT) 80-12.5 MG per tablet  Take 1 tablet by mouth daily., Until Discontinued, Historical Med    etodolac (LODINE) 500 MG tablet Take 500 mg by mouth 2 (two) times daily., Until Discontinued, Historical Med    famotidine (PEPCID) 20 MG tablet One at bedtime, Print    mesalamine (LIALDA) 1.2 G EC tablet Take 1,200 mg by mouth 2 (two) times daily., Until Discontinued, Historical Med    nebivolol (BYSTOLIC) 5 MG tablet Take 1 tablet (5 mg total) by mouth daily., Starting 02/27/2013, Until Discontinued, No Print    pantoprazole (PROTONIX) 40 MG tablet Take 1 tablet (40 mg total) by mouth daily. Take 30-60 min before first meal of the day, Starting 02/27/2013, Until Discontinued, Print     !! - Potential duplicate medications found. Please discuss with provider.    STOP taking these medications     amoxicillin-clavulanate (AUGMENTIN) 875-125 MG per tablet          DISCHARGE INSTRUCTIONS:   Get hearing test done on Friday as scheduled Follow-up with primary care physician in a week Follow-up with ENT Dr. Pryor Ochoa in a week follow-up with neurology as needed Continue outpatient physical therapy  DIET:   Diet low-salt and diabetic  DISCHARGE CONDITION:  fair  ACTIVITY:  Activity as tolerated per PT recommendations  OXYGEN:  Home Oxygen: no   Oxygen Delivery: no  DISCHARGE LOCATION:  home  If you experience worsening of your admission symptoms, develop shortness of breath, life threatening emergency, suicidal or homicidal thoughts you must seek medical attention immediately by calling 911 or calling your MD immediately  if symptoms less severe.  You Must read complete instructions/literature along with all the possible adverse reactions/side effects for all the Medicines you take and that have been prescribed to you. Take any new Medicines after you have completely understood and accpet all the possible adverse reactions/side effects.   Please note  You were cared for by a hospitalist during your  hospital stay. If you have any questions about your discharge medications or the care you received while you were in the hospital after you are discharged, you can call the unit and asked to speak with the hospitalist on call if the hospitalist that took care of you is not available. Once you are discharged, your primary care physician will handle any further medical issues. Please note that NO REFILLS for any discharge medications will be authorized once you are discharged, as it is imperative that you return to your primary care physician (or establish a relationship with a primary care physician if you do not have one) for your aftercare needs so that they can reassess your need for medications and monitor your lab values.     Today  Chief Complaint  Patient presents with  . Dizziness   Pt is feeling better , dizziness is better  ROS:  CONSTITUTIONAL: Denies fevers, chills. Denies any fatigue, weakness.  EYES: Denies blurry vision, double vision, eye pain. EARS, NOSE, THROAT: Denies tinnitus, ear pain, hearing loss. RESPIRATORY: Denies cough, wheeze, shortness of breath.  CARDIOVASCULAR: Denies chest pain, palpitations, edema.  GASTROINTESTINAL: Denies nausea, vomiting, diarrhea, abdominal pain. Denies  bright red blood per rectum. GENITOURINARY: Denies dysuria, hematuria. ENDOCRINE: Denies nocturia or thyroid problems. HEMATOLOGIC AND LYMPHATIC: Denies easy bruising or bleeding. SKIN: Denies rash or lesion. MUSCULOSKELETAL: Denies pain in neck, back, shoulder, knees, hips or arthritic symptoms.  NEUROLOGIC: Denies paralysis, paresthesias.  PSYCHIATRIC: Denies anxiety or depressive symptoms.   VITAL SIGNS:  Blood pressure 127/68, pulse 71, temperature 97.9 F (36.6 C), temperature source Oral, resp. rate 18, height 5' 11"  (1.803 m), weight 133.811 kg (295 lb), SpO2 98 %.  I/O:  No intake or output data in the 24 hours ending 09/07/15 1040  PHYSICAL EXAMINATION:  GENERAL:  64  y.o.-year-old patient lying in the bed with no acute distress.  EYES: Pupils equal, round, reactive to light and accommodation. No scleral icterus. Extraocular muscles intact.  HEENT: Head atraumatic, normocephalic. Oropharynx and nasopharynx clear.  NECK:  Supple, no jugular venous distention. No thyroid enlargement, no tenderness.  LUNGS: Normal breath sounds bilaterally, no wheezing, rales,rhonchi or crepitation. No use of accessory muscles of respiration.  CARDIOVASCULAR: S1, S2 normal. No murmurs, rubs, or gallops.  ABDOMEN: Soft, non-tender, non-distended. Bowel sounds present. No organomegaly or mass.  EXTREMITIES: No pedal edema, cyanosis, or clubbing.  NEUROLOGIC: Cranial nerves II through XII are intact. Muscle strength 5/5 in all extremities. Sensation intact. Gait not checked. No nystagmus PSYCHIATRIC: The patient is alert and oriented x 3.  SKIN: No obvious rash, lesion, or ulcer.   DATA REVIEW:   CBC No results for input(s): WBC, HGB, HCT, PLT in the last 168 hours.  Chemistries  No results for input(s): NA, K, CL, CO2, GLUCOSE, BUN, CREATININE, CALCIUM, MG, AST, ALT, ALKPHOS, BILITOT in the last 168 hours.  Invalid input(s): GFRCGP  Cardiac Enzymes No results for input(s): TROPONINI in the last 168 hours.  Microbiology Results  No results found for this or any previous visit.  RADIOLOGY:  No results found.  EKG:   Orders placed or performed during the hospital encounter of 08/29/15  . ED EKG  . ED EKG  . EKG      Management plans discussed with the patient, family and they are in agreement.  CODE STATUS:   TOTAL TIME TAKING CARE OF THIS PATIENT: 45 minutes.    @[facility] @  on 09/07/2015 at 10:40 AM  Between 7am to 6pm - Pager - [phone]  After 6pm go to [email or web address] - password [email or web address] [facility]  Office  [phone]  CC: Primary care physician; Lelon Huh, MD

## 2015-09-21 ENCOUNTER — Other Ambulatory Visit: Payer: Self-pay | Admitting: *Deleted

## 2015-09-21 MED ORDER — GLIPIZIDE 5 MG PO TABS: 2.5000 mg | ORAL_TABLET | Freq: Every day | ORAL | Status: DC

## 2015-09-27 ENCOUNTER — Other Ambulatory Visit: Payer: Self-pay | Admitting: Family Medicine

## 2015-11-10 ENCOUNTER — Other Ambulatory Visit: Payer: Self-pay | Admitting: Family Medicine

## 2015-11-15 ENCOUNTER — Telehealth: Payer: Self-pay | Admitting: *Deleted

## 2015-11-15 NOTE — Telephone Encounter (Signed)
Received call-a-nurse message stating that patient had called 11/13/15 concerning nosebleed. Message states patient has had congestion and unable to kick-it. Head clogs-up and he feels like his nose is running, left side will bleed. Call-a-nurse advised pt to contact pcp for ov appt. Called pt to see if he wants to schedule an ov appt. LMOVM for pt to return call.

## 2015-11-17 ENCOUNTER — Encounter: Payer: Self-pay | Admitting: Family Medicine

## 2015-11-17 ENCOUNTER — Ambulatory Visit (INDEPENDENT_AMBULATORY_CARE_PROVIDER_SITE_OTHER): Payer: 59 | Admitting: Family Medicine

## 2015-11-17 VITALS — BP 102/64 | HR 67 | Temp 98.7°F | Resp 16 | Wt 299.0 lb

## 2015-11-17 DIAGNOSIS — R040 Epistaxis: Secondary | ICD-10-CM | POA: Diagnosis not present

## 2015-11-17 DIAGNOSIS — J309 Allergic rhinitis, unspecified: Secondary | ICD-10-CM | POA: Diagnosis not present

## 2015-11-17 DIAGNOSIS — Z23 Encounter for immunization: Secondary | ICD-10-CM | POA: Diagnosis not present

## 2015-11-17 DIAGNOSIS — E1149 Type 2 diabetes mellitus with other diabetic neurological complication: Secondary | ICD-10-CM | POA: Diagnosis not present

## 2015-11-17 MED ORDER — FLUTICASONE PROPIONATE 50 MCG/ACT NA SUSP: 2.0000 | Freq: Every day | NASAL | Status: DC

## 2015-11-17 NOTE — Telephone Encounter (Signed)
Patient has appt scheduled today with Fisher at 3:45pm.

## 2015-11-17 NOTE — Progress Notes (Signed)
Patient: Edward King Male    DOB: [birth year]   64 y.o.   MRN: [id number] Visit Date: 11/17/2015  Today's Provider: Lelon Huh, MD   Chief Complaint  Patient presents with  . Epistaxis   Subjective:    HPI  Patient complains of nasal congestion off and on for the last couple of months L>R, associated with frequent nose bleed on the left. Has used OTC nasal decongestants.   Diabetes Mellitus Type II, Follow-up:   Lab Results  Component Value Date   HGBA1C 5.8 08/29/2015   HGBA1C 6.2* 10/09/2014   HGBA1C 6.0 08/30/2014    He reports good compliance with treatment. He is not having side effects.   Home blood sugar records: fasting range:  usually in low 100s, but doesn't check frequently.   Episodes of hypoglycemia? no    Pertinent Labs:    Component Value Date/Time   CHOL 200 10/09/2014   CHOL 174 08/30/2014 0450   TRIG 101 10/09/2014   TRIG 73 08/30/2014 0450   HDL 62 10/09/2014   HDL 40 08/30/2014 0450   LDLCALC 118 10/09/2014   LDLCALC 119* 08/30/2014 0450   CREATININE 0.87 08/29/2015 0322   CREATININE 1.3 10/09/2014   CREATININE 1.10 08/30/2014 0450    Wt Readings from Last 3 Encounters:  11/17/15 299 lb (135.626 kg)  08/30/15 295 lb (133.811 kg)  06/17/15 288 lb 6.4 oz (130.817 kg)    ------------------------------------------------------------------------     No Known Allergies Previous Medications   CITALOPRAM (CELEXA) 20 MG TABLET    Take 30 mg by mouth daily.    FOLIC ACID (FOLVITE) 1 MG TABLET    Take by mouth.   GABAPENTIN (NEURONTIN) 300 MG CAPSULE    TAKE 2 CAPSULES BY MOUTH IN THE MORNING AND 3 CAPSULES AT BEDTIME   GLIPIZIDE (GLUCOTROL XL) 2.5 MG 24 HR TABLET    TAKE 1 TABLET BY MOUTH ONCE DAILY   METFORMIN (GLUCOPHAGE-XR) 500 MG 24 HR TABLET    TAKE 2 TABLETS BY MOUTH TWO TIMES DAILY   METHOTREXATE 2.5 MG TABLET    Take 20 mg by mouth once a week.    PREDNISONE (DELTASONE) 10 MG TABLET    2.5 mg. Tapered dose as directed    TAMSULOSIN (FLOMAX) 0.4 MG CAPS CAPSULE    Take 0.4 mg by mouth daily after supper.   VALSARTAN-HYDROCHLOROTHIAZIDE (DIOVAN-HCT) 80-12.5 MG PER TABLET    Take 1 tablet by mouth daily.    Review of Systems  Constitutional: Negative for fever.  HENT: Positive for congestion, ear pain, nosebleeds, postnasal drip and sneezing.        Nosebleed for the last 2 months, off and on. Occasional right ear pain  Respiratory: Negative for cough and shortness of breath.     Social History  Substance Use Topics  . Smoking status: Never Smoker   . Smokeless tobacco: Never Used  . Alcohol Use: 0.0 oz/week    0 Standard drinks or equivalent per week     Comment: Occasional use   Objective:   BP 102/64 mmHg  Pulse 67  Temp(Src) 98.7 F (37.1 C) (Oral)  Resp 16  Wt 299 lb (135.626 kg)  SpO2 97%  Physical Exam  General Appearance:    Alert, cooperative, no distress  HENT:   bilateral TM normal without fluid or infection, sinuses nontender and nasal mucosa pale and congested  Eyes:    PERRL, conjunctiva/corneas clear, EOM's intact  Lungs:     Clear to auscultation bilaterally, respirations unlabored  Heart:    Regular rate and rhythm  Neurologic:   Awake, alert, oriented x 3. No apparent focal neurological           defect.       Hemoglobin A1c  Result Value Ref Range   Hgb A1c MFr Bld 6.2 (A) 4.0 - 6.0 %       Assessment & Plan:     1. Type 2 diabetes mellitus with other neurologic complication, without long-term current use of insulin (HCC) Well controlled.  Continue current medications.   - POCT glycosylated hemoglobin (Hb A1C)  2. Epistaxis Is to start using saline nasal spray every 3-4 hours. Likely aggravated by allergies. He has some Flonase at home and will restart for Allergies. Will call back back for ENT referral if not resolving in a few weeks.   3. Need for prophylactic vaccination and inoculation against influenza  - Pneumococcal conjugate vaccine 13-valent  IM  4. Allergic rhinitis, unspecified allergic rhinitis type         Lelon Huh, MD  [facility]

## 2015-11-21 ENCOUNTER — Encounter: Payer: Self-pay | Admitting: Family Medicine

## 2015-11-21 DIAGNOSIS — R040 Epistaxis: Secondary | ICD-10-CM | POA: Insufficient documentation

## 2015-12-28 DIAGNOSIS — J342 Deviated nasal septum: Secondary | ICD-10-CM | POA: Diagnosis not present

## 2015-12-28 DIAGNOSIS — J328 Other chronic sinusitis: Secondary | ICD-10-CM | POA: Diagnosis not present

## 2015-12-28 DIAGNOSIS — J329 Chronic sinusitis, unspecified: Secondary | ICD-10-CM | POA: Diagnosis not present

## 2015-12-31 DIAGNOSIS — M316 Other giant cell arteritis: Secondary | ICD-10-CM | POA: Diagnosis not present

## 2016-01-12 DIAGNOSIS — F331 Major depressive disorder, recurrent, moderate: Secondary | ICD-10-CM | POA: Diagnosis not present

## 2016-01-12 DIAGNOSIS — J340 Abscess, furuncle and carbuncle of nose: Secondary | ICD-10-CM | POA: Diagnosis not present

## 2016-01-12 DIAGNOSIS — R0981 Nasal congestion: Secondary | ICD-10-CM | POA: Diagnosis not present

## 2016-01-12 DIAGNOSIS — J342 Deviated nasal septum: Secondary | ICD-10-CM | POA: Diagnosis not present

## 2016-01-12 DIAGNOSIS — R040 Epistaxis: Secondary | ICD-10-CM | POA: Diagnosis not present

## 2016-01-25 ENCOUNTER — Other Ambulatory Visit: Payer: Self-pay | Admitting: Family Medicine

## 2016-01-26 DIAGNOSIS — R0981 Nasal congestion: Secondary | ICD-10-CM | POA: Diagnosis not present

## 2016-01-26 DIAGNOSIS — J342 Deviated nasal septum: Secondary | ICD-10-CM | POA: Diagnosis not present

## 2016-02-01 ENCOUNTER — Encounter
Admission: RE | Admit: 2016-02-01 | Discharge: 2016-02-01 | Disposition: A | Discharge status: Home / Self Care | Payer: 59 | Source: Ambulatory Visit | Attending: Otolaryngology | Admitting: Otolaryngology

## 2016-02-01 ENCOUNTER — Telehealth: Payer: Self-pay | Admitting: Family Medicine

## 2016-02-01 DIAGNOSIS — Z01812 Encounter for preprocedural laboratory examination: Secondary | ICD-10-CM | POA: Diagnosis not present

## 2016-02-01 HISTORY — DX: Lymphedema, not elsewhere classified: I89.0

## 2016-02-01 LAB — BASIC METABOLIC PANEL
Anion gap: 4 — ABNORMAL LOW (ref 5–15)
BUN: 16 mg/dL (ref 6–20)
CALCIUM: 9.1 mg/dL (ref 8.9–10.3)
CHLORIDE: 102 mmol/L (ref 101–111)
CO2: 32 mmol/L (ref 22–32)
CREATININE: 0.68 mg/dL (ref 0.61–1.24)
GFR calc Af Amer: >60 mL/min (ref >60)
GFR calc non Af Amer: >60 mL/min (ref >60)
GLUCOSE: 95 mg/dL (ref 65–99)
Potassium: 4.2 mmol/L (ref 3.5–5.1)
Sodium: 138 mmol/L (ref 135–145)

## 2016-02-01 LAB — SURGICAL PCR SCREEN
MRSA, PCR: NEGATIVE
Staphylococcus aureus: NEGATIVE

## 2016-02-01 NOTE — Telephone Encounter (Signed)
Patient scheduled appt for 02/02/2016.

## 2016-02-01 NOTE — Pre-Procedure Instructions (Signed)
Dr. Darien Ramus office notified patient that he needs to call Dr. Gildardo Pounds office so he can be cleared for surgery.  Dr. Jefm Bryant cleared him for surgery and not on chart.

## 2016-02-01 NOTE — Telephone Encounter (Signed)
Pt is returning call.  CB#  [id number]

## 2016-02-01 NOTE — Patient Instructions (Signed)
  Your procedure is scheduled on:02/10/16 Report to Day Surgery. To find out your arrival time please call [phone] between 1PM - 3PM on 02/09/16.  Remember: Instructions that are not followed completely may result in serious medical risk, up to and including death, or upon the discretion of your surgeon and anesthesiologist your surgery may need to be rescheduled.    __x__ 1. Do not eat food or drink liquids after midnight. No gum chewing or hard candies.     __x__ 2. No Alcohol for 24 hours before or after surgery.   ____ 3. Bring all medications with you on the day of surgery if instructed.    __x__ 4. Notify your doctor if there is any change in your medical condition     (cold, fever, infections).     Do not wear jewelry, make-up, hairpins, clips or nail polish.  Do not wear lotions, powders, or perfumes. You may wear deodorant.  Do not shave 48 hours prior to surgery. Men may shave face and neck.  Do not bring valuables to the hospital.    [facility] is not responsible for any belongings or valuables.               Contacts, dentures or bridgework may not be worn into surgery.  Leave your suitcase in the car. After surgery it may be brought to your room.  For patients admitted to the hospital, discharge time is determined by your                treatment team.   Patients discharged the day of surgery will not be allowed to drive home.   Please read over the following fact sheets that you were given:   MRSA Information and Surgical Site Infection Prevention   ____ Take these medicines the morning of surgery with A SIP OF WATER:    1. gabapentin  2. diovan hct bring to hospital  3.   4.  5.  6.  ____ Fleet Enema (as directed)   ____ Use CHG Soap as directed  ____ Use inhalers on the day of surgery  __x__ Stop metformin 2 days prior to surgery 2/20 last dose   ____ Take 1/2 of usual insulin dose the night before surgery and none on the morning of surgery.    ____ Stop Coumadin/Plavix/aspirin on   __x__ Stop Anti-inflammatories on tylenol only for 7 days before the surgery   ____ Stop supplements until after surgery.    ____ Bring C-Pap to the hospital.

## 2016-02-02 ENCOUNTER — Encounter: Payer: Self-pay | Admitting: Family Medicine

## 2016-02-02 ENCOUNTER — Ambulatory Visit (INDEPENDENT_AMBULATORY_CARE_PROVIDER_SITE_OTHER): Payer: 59 | Admitting: Family Medicine

## 2016-02-02 VITALS — BP 108/60 | HR 72 | Temp 98.3°F | Resp 16 | Ht 71.0 in | Wt 292.0 lb

## 2016-02-02 DIAGNOSIS — E119 Type 2 diabetes mellitus without complications: Secondary | ICD-10-CM

## 2016-02-02 DIAGNOSIS — Z01818 Encounter for other preprocedural examination: Secondary | ICD-10-CM

## 2016-02-02 LAB — POCT GLYCOSYLATED HEMOGLOBIN (HGB A1C)
ESTIMATED AVERAGE GLUCOSE: 126
HEMOGLOBIN A1C: 6

## 2016-02-02 NOTE — Progress Notes (Signed)
Patient: Edward King Male    DOB: 09-12-1951   65 y.o.   MRN: [id number] Visit Date: 02/02/2016  Today's Provider: Lelon Huh, MD   Chief Complaint  Patient presents with  . Medical Clearance    Surgical clearance   Subjective:    HPI  Surgical Clearance:  Patient is scheduled to have Septoplasty, Bilateral inferior turbinate reduction on 02/09/2015. Surgeon will be Dr. Pryor Ochoa from [facility]. Patient will undergo General anesthesia.Today patient feels fairly well. He has been having sinus drainage and congestion. Patient denies any fevers, chills or sweats. Denies dyspnea or chest pains. Has no history of surgical or anesthesia complications.  He does have a history sleep apnea, but has lost over 100pounds since diagnosis and does not use CPAP. He feels well rested during the day.   BMET    Component Value Date/Time   NA 138 02/01/2016 1119   NA 144 10/09/2014   NA 132* 08/30/2014 0450   K 4.2 02/01/2016 1119   K 4.5 08/30/2014 0450   CL 102 02/01/2016 1119   CL 99 08/30/2014 0450   CO2 32 02/01/2016 1119   CO2 27 08/30/2014 0450   GLUCOSE 95 02/01/2016 1119   GLUCOSE 196* 08/30/2014 0450   BUN 16 02/01/2016 1119   BUN 27* 10/09/2014   BUN 20* 08/30/2014 0450   CREATININE 0.68 02/01/2016 1119   CREATININE 1.3 10/09/2014   CREATININE 1.10 08/30/2014 0450   CALCIUM 9.1 02/01/2016 1119   CALCIUM 9.0 08/30/2014 0450   GFRNONAA >60 02/01/2016 1119   GFRNONAA >60 08/30/2014 0450   GFRAA >60 02/01/2016 1119   GFRAA >60 08/30/2014 0450         Diabetes Mellitus Type II, Follow-up:   Lab Results  Component Value Date   HGBA1C 5.8 08/29/2015   HGBA1C 6.2* 10/09/2014   HGBA1C 6.0 08/30/2014    Last seen for diabetes 3 months ago.  Management since then includes none. He reports good compliance with treatment. He is not having side effects.  Current symptoms include paresthesia of the feet and have been stable. Home blood sugar records: fasting  range: 80-120  Episodes of hypoglycemia? yes - rarely occurs   Current Insulin Regimen: none Most Recent Eye Exam: <1 year ago Weight trend: stable Prior visit with dietician: no Current diet: in general, a "healthy" diet   Current exercise: none  Pertinent Labs:    Component Value Date/Time   CHOL 200 10/09/2014   CHOL 174 08/30/2014 0450   TRIG 101 10/09/2014   TRIG 73 08/30/2014 0450   HDL 62 10/09/2014   HDL 40 08/30/2014 0450   LDLCALC 118 10/09/2014   LDLCALC 119* 08/30/2014 0450   CREATININE 0.68 02/01/2016 1119   CREATININE 1.3 10/09/2014   CREATININE 1.10 08/30/2014 0450    Wt Readings from Last 3 Encounters:  02/02/16 292 lb (132.45 kg)  02/01/16 287 lb (130.182 kg)  11/17/15 299 lb (135.626 kg)    ------------------------------------------------------------------------    No Known Allergies Previous Medications   CITALOPRAM (CELEXA) 20 MG TABLET    Take 30 mg by mouth daily.    FOLIC ACID (FOLVITE) 1 MG TABLET    Take by mouth.   GABAPENTIN (NEURONTIN) 300 MG CAPSULE    TAKE 2 CAPSULES BY MOUTH IN THE MORNING AND 3 CAPSULES AT BEDTIME   GENTAMICIN OINTMENT (GARAMYCIN) 0.1 %    Apply 1 application topically 2 (two) times daily. Apply to nostril   GLIPIZIDE (  GLUCOTROL XL) 2.5 MG 24 HR TABLET    TAKE 1 TABLET BY MOUTH ONCE DAILY   METFORMIN (GLUCOPHAGE-XR) 500 MG 24 HR TABLET    TAKE 2 TABLETS BY MOUTH TWO TIMES DAILY   METHOTREXATE 2.5 MG TABLET    Take 20 mg by mouth once a week.    MULTIPLE VITAMIN (MULTIVITAMIN WITH MINERALS) TABS TABLET    Take 1 tablet by mouth daily.   TAMSULOSIN (FLOMAX) 0.4 MG CAPS CAPSULE    Take 0.4 mg by mouth daily after supper.   VALSARTAN-HYDROCHLOROTHIAZIDE (DIOVAN-HCT) 80-12.5 MG PER TABLET    Take 1 tablet by mouth daily.    Review of Systems  Constitutional: Negative for fever, chills and appetite change.  HENT: Positive for congestion (sinus congestion), postnasal drip and sinus pressure. Negative for sore throat.     Respiratory: Negative for chest tightness, shortness of breath and wheezing.   Cardiovascular: Negative for chest pain and palpitations.  Gastrointestinal: Negative for nausea, vomiting and abdominal pain.   Past Surgical History  Procedure Laterality Date  . Temporal artery biopsy / ligation Left   . Doppler echocardiography  08/29/2014    EF=60-65%. Normal LVEF  . Carotid doppler ultrasound  08/22/2014    [facility]; Minimal plaque right. Minimal thisckening left Anterograde flow vertebrals  . Ct of the head  08/22/2014    [facility]. Normal  . Myocardial perfusion scan  09/10/2012    Poor exercsie tolerance, but no evidence of stress induced myocardial ischemia. EF=66%. Dr. Clayborn Bigness  . Abdominoplasty     Past Medical History  Diagnosis Date  . History of chicken pox   . History of measles   . History of mumps   . Temporal arteritis (HCC)     Clinically dx Dr. Jefm Bryant. Negative temporal artrtery bx  . Lymph edema     both legs.  mostly left  . 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  Visual loss left eye.  Ischemic retinopathy. Visual loss right eye .  CT negative.  Carotid ultrasound with mild plaque.  Normal echocardiogram.  Elevated CRP but normal sed rate.  Negative temporal artery biopsy.  Prednisone   . 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  Visual loss left eye.  Ischemic retinopathy. Visual loss right eye .  CT negative.  Carotid ultrasound with mild plaque.  Normal echocardiogram.  Elevated CRP but normal sed rate.  Negative temporal artery biopsy.  Prednisone    Patient Active Problem List   Diagnosis Date Noted  . 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 Hypertension 06/16/2015  . Acquired lymphedema 06/16/2015  . Obstructive sleep apnea 06/16/2015  . Diabetes mellitus with neurological manifestation ([address]) 05/06/2014  . Apnea, sleep 05/06/2014  .  Peripheral edema 02/28/2013      Social History  Substance Use Topics  . Smoking status: Never Smoker   . Smokeless tobacco: Never Used  . Alcohol Use: 0.0 oz/week    0 Standard drinks or equivalent per week     Comment: Occasional use   Objective:   BP 108/60 mmHg  Pulse 72  Temp(Src) 98.3 F (36.8 C) (Oral)  Resp 16  Ht 5' 11"  (1.803 m)  Wt 292 lb (132.45 kg)  BMI 40.74 kg/m2  SpO2 97%  Physical Exam  General Appearance:    Alert, cooperative, no distress, morbidly obese  ENT:  Slightly congestion of nasal airways.   Eyes:    PERRL, conjunctiva/corneas clear, EOM's intact       Lungs:     Clear to auscultation bilaterally, respirations unlabored  Heart:    Regular rate and rhythm, no murmurs rubs or allopes.   Neurologic:   Awake, alert, oriented x 3. No apparent focal neurological           defect.       EKG: Incomplete right bundle branch block. No ischemic changes.   Results for orders placed or performed in visit on 02/02/16  POCT HgB A1C  Result Value Ref Range   Hemoglobin A1C 6.0    Est. average glucose Bld gHb Est-mCnc 126         Assessment & Plan:     1. Pre-operative clearance No absolute or relative contraindications for surgery or general anesthesia. Cardiac risk factors include  Hypertension and type 2 diabetes which are well controlled, and morbid obesity. He is low risk for cardia-pulmonary complications of anticipated surgery and anesthesia - EKG 12-Lead  2. Controlled type 2 diabetes mellitus without complication, without long-term current use of insulin ([place]) Well controlled.  Continue current medications.   Follow up 6 months.  - POCT HgB A1C      Lelon Huh, MD  [facility]

## 2016-02-03 DIAGNOSIS — R300 Dysuria: Secondary | ICD-10-CM | POA: Diagnosis not present

## 2016-02-03 DIAGNOSIS — R3121 Asymptomatic microscopic hematuria: Secondary | ICD-10-CM | POA: Diagnosis not present

## 2016-02-03 DIAGNOSIS — R319 Hematuria, unspecified: Secondary | ICD-10-CM | POA: Diagnosis not present

## 2016-02-03 DIAGNOSIS — D400 Neoplasm of uncertain behavior of prostate: Secondary | ICD-10-CM | POA: Diagnosis not present

## 2016-02-03 DIAGNOSIS — R310 Gross hematuria: Secondary | ICD-10-CM | POA: Diagnosis not present

## 2016-02-03 DIAGNOSIS — N5201 Erectile dysfunction due to arterial insufficiency: Secondary | ICD-10-CM | POA: Diagnosis not present

## 2016-02-03 NOTE — Pre-Procedure Instructions (Signed)
CLEARED LOW RISK BY DR Caryn Section 02/02/16

## 2016-02-08 ENCOUNTER — Other Ambulatory Visit: Payer: Self-pay | Admitting: Urology

## 2016-02-08 DIAGNOSIS — R310 Gross hematuria: Secondary | ICD-10-CM

## 2016-02-08 DIAGNOSIS — M316 Other giant cell arteritis: Secondary | ICD-10-CM | POA: Diagnosis not present

## 2016-02-10 ENCOUNTER — Encounter
Admission: RE | Disposition: A | Discharge status: Home / Self Care | Payer: Self-pay | Source: Ambulatory Visit | Attending: Otolaryngology

## 2016-02-10 ENCOUNTER — Ambulatory Visit
Admission: RE | Admit: 2016-02-10 | Discharge: 2016-02-10 | Disposition: A | Discharge status: Home / Self Care | Payer: 59 | Source: Ambulatory Visit | Attending: Otolaryngology | Admitting: Otolaryngology

## 2016-02-10 ENCOUNTER — Ambulatory Visit: Payer: 59 | Admitting: Anesthesiology

## 2016-02-10 ENCOUNTER — Encounter: Payer: Self-pay | Admitting: *Deleted

## 2016-02-10 DIAGNOSIS — E119 Type 2 diabetes mellitus without complications: Secondary | ICD-10-CM | POA: Insufficient documentation

## 2016-02-10 DIAGNOSIS — R040 Epistaxis: Secondary | ICD-10-CM | POA: Insufficient documentation

## 2016-02-10 DIAGNOSIS — Z6841 Body Mass Index (BMI) 40.0 and over, adult: Secondary | ICD-10-CM | POA: Insufficient documentation

## 2016-02-10 DIAGNOSIS — I739 Peripheral vascular disease, unspecified: Secondary | ICD-10-CM | POA: Diagnosis not present

## 2016-02-10 DIAGNOSIS — I10 Essential (primary) hypertension: Secondary | ICD-10-CM | POA: Diagnosis not present

## 2016-02-10 DIAGNOSIS — J342 Deviated nasal septum: Secondary | ICD-10-CM | POA: Insufficient documentation

## 2016-02-10 DIAGNOSIS — J343 Hypertrophy of nasal turbinates: Secondary | ICD-10-CM | POA: Insufficient documentation

## 2016-02-10 DIAGNOSIS — G4730 Sleep apnea, unspecified: Secondary | ICD-10-CM | POA: Insufficient documentation

## 2016-02-10 DIAGNOSIS — E669 Obesity, unspecified: Secondary | ICD-10-CM | POA: Insufficient documentation

## 2016-02-10 DIAGNOSIS — J3489 Other specified disorders of nose and nasal sinuses: Secondary | ICD-10-CM | POA: Diagnosis not present

## 2016-02-10 HISTORY — DX: Type 2 diabetes mellitus without complications: E11.9

## 2016-02-10 HISTORY — PX: NASAL SEPTOPLASTY W/ TURBINOPLASTY: SHX2070

## 2016-02-10 LAB — GLUCOSE, CAPILLARY
GLUCOSE-CAPILLARY: 121 mg/dL — AB (ref 65–99)
Glucose-Capillary: 117 mg/dL — ABNORMAL HIGH (ref 65–99)

## 2016-02-10 SURGERY — SEPTOPLASTY, NOSE, WITH NASAL TURBINATE REDUCTION
Anesthesia: General | Laterality: Bilateral

## 2016-02-10 MED ORDER — BACITRACIN 500 UNIT/GM EX OINT
TOPICAL_OINTMENT | CUTANEOUS | Status: DC | PRN
  Administered 2016-02-10: 1 via TOPICAL

## 2016-02-10 MED ORDER — PROPOFOL 10 MG/ML IV BOLUS
INTRAVENOUS | Status: DC | PRN
  Administered 2016-02-10: 200 mg via INTRAVENOUS

## 2016-02-10 MED ORDER — FENTANYL CITRATE (PF) 100 MCG/2ML IJ SOLN
INTRAMUSCULAR | Status: DC | PRN
  Administered 2016-02-10 (×3): 50 ug via INTRAVENOUS

## 2016-02-10 MED ORDER — SUGAMMADEX SODIUM 500 MG/5ML IV SOLN
INTRAVENOUS | Status: DC | PRN
  Administered 2016-02-10: 265 mg via INTRAVENOUS

## 2016-02-10 MED ORDER — FAMOTIDINE 20 MG PO TABS
ORAL_TABLET | ORAL | Status: AC
  Administered 2016-02-10: 20 mg via ORAL
  Filled 2016-02-10: qty 1

## 2016-02-10 MED ORDER — SULFAMETHOXAZOLE-TRIMETHOPRIM 800-160 MG PO TABS: 1.0000 | ORAL_TABLET | Freq: Two times a day (BID) | ORAL | Status: DC

## 2016-02-10 MED ORDER — MIDAZOLAM HCL 2 MG/2ML IJ SOLN
INTRAMUSCULAR | Status: DC | PRN
  Administered 2016-02-10: 2 mg via INTRAVENOUS

## 2016-02-10 MED ORDER — SODIUM CHLORIDE 0.9 % IV SOLN
INTRAVENOUS | Status: DC
  Administered 2016-02-10: 09:00:00 via INTRAVENOUS

## 2016-02-10 MED ORDER — FENTANYL CITRATE (PF) 100 MCG/2ML IJ SOLN
INTRAMUSCULAR | Status: AC
  Filled 2016-02-10: qty 2

## 2016-02-10 MED ORDER — OXYMETAZOLINE HCL 0.05 % NA SOLN
NASAL | Status: AC
  Filled 2016-02-10: qty 30

## 2016-02-10 MED ORDER — FENTANYL CITRATE (PF) 100 MCG/2ML IJ SOLN
25.0000 ug | INTRAMUSCULAR | Status: AC | PRN
  Administered 2016-02-10 (×6): 25 ug via INTRAVENOUS

## 2016-02-10 MED ORDER — ROCURONIUM BROMIDE 100 MG/10ML IV SOLN
INTRAVENOUS | Status: DC | PRN
  Administered 2016-02-10: 10 mg via INTRAVENOUS
  Administered 2016-02-10: 5 mg via INTRAVENOUS
  Administered 2016-02-10: 10 mg via INTRAVENOUS
  Administered 2016-02-10: 25 mg via INTRAVENOUS

## 2016-02-10 MED ORDER — LIDOCAINE HCL (CARDIAC) 20 MG/ML IV SOLN
INTRAVENOUS | Status: DC | PRN
  Administered 2016-02-10: 100 mg via INTRAVENOUS

## 2016-02-10 MED ORDER — ONDANSETRON HCL 4 MG/2ML IJ SOLN: 4.0000 mg | Freq: Once | INTRAMUSCULAR | Status: DC | PRN

## 2016-02-10 MED ORDER — OXYMETAZOLINE HCL 0.05 % NA SOLN
NASAL | Status: DC | PRN
  Administered 2016-02-10: 1

## 2016-02-10 MED ORDER — SUCCINYLCHOLINE CHLORIDE 20 MG/ML IJ SOLN
INTRAMUSCULAR | Status: DC | PRN
  Administered 2016-02-10: 120 mg via INTRAVENOUS

## 2016-02-10 MED ORDER — LIDOCAINE-EPINEPHRINE 1 %-1:100000 IJ SOLN
INTRAMUSCULAR | Status: AC
  Filled 2016-02-10: qty 1

## 2016-02-10 MED ORDER — HYDROCODONE-ACETAMINOPHEN 5-325 MG PO TABS
1.0000 | ORAL_TABLET | ORAL | Status: DC | PRN
  Administered 2016-02-10: 1 via ORAL

## 2016-02-10 MED ORDER — PHENYLEPHRINE HCL 10 MG/ML IJ SOLN
INTRAMUSCULAR | Status: DC | PRN
  Administered 2016-02-10: 100 ug via INTRAVENOUS

## 2016-02-10 MED ORDER — PROMETHAZINE HCL 12.5 MG PO TABS: 12.5000 mg | ORAL_TABLET | Freq: Four times a day (QID) | ORAL | Status: DC | PRN

## 2016-02-10 MED ORDER — HYDROCODONE-ACETAMINOPHEN 5-325 MG PO TABS: 1.0000 | ORAL_TABLET | ORAL | Status: DC | PRN

## 2016-02-10 MED ORDER — LIDOCAINE-EPINEPHRINE 1 %-1:100000 IJ SOLN
INTRAMUSCULAR | Status: DC | PRN
  Administered 2016-02-10: 9 mL

## 2016-02-10 MED ORDER — HYDROCODONE-ACETAMINOPHEN 5-325 MG PO TABS
ORAL_TABLET | ORAL | Status: AC
  Administered 2016-02-10: 1 via ORAL
  Filled 2016-02-10: qty 1

## 2016-02-10 MED ORDER — ONDANSETRON HCL 4 MG/2ML IJ SOLN
INTRAMUSCULAR | Status: DC | PRN
  Administered 2016-02-10: 4 mg via INTRAVENOUS

## 2016-02-10 MED ORDER — BACITRACIN ZINC 500 UNIT/GM EX OINT
TOPICAL_OINTMENT | CUTANEOUS | Status: AC
  Filled 2016-02-10: qty 28.35

## 2016-02-10 MED ORDER — FAMOTIDINE 20 MG PO TABS
20.0000 mg | ORAL_TABLET | Freq: Once | ORAL | Status: AC
  Administered 2016-02-10: 20 mg via ORAL

## 2016-02-10 SURGICAL SUPPLY — 26 items
BANDAGE EYE OVAL (MISCELLANEOUS) IMPLANT
BLADE SURG 15 STRL LF DISP TIS (BLADE) ×1 IMPLANT
BLADE SURG 15 STRL SS (BLADE) ×1
CANISTER SUCT 1200ML W/VALVE (MISCELLANEOUS) ×2 IMPLANT
COAG SUCT 10F 3.5MM HAND CTRL (MISCELLANEOUS) ×2 IMPLANT
DRESSING NASL FOAM PST OP SINU (MISCELLANEOUS) ×1 IMPLANT
DRSG NASAL 4CM NASOPORE (MISCELLANEOUS) ×2 IMPLANT
DRSG NASAL FOAM POST OP SINU (MISCELLANEOUS) ×2
GAUZE PACK 2X3YD (MISCELLANEOUS) ×2 IMPLANT
GLOVE BIO SURGEON STRL SZ7.5 (GLOVE) ×4 IMPLANT
GOWN STRL REUS W/ TWL LRG LVL3 (GOWN DISPOSABLE) ×2 IMPLANT
GOWN STRL REUS W/TWL LRG LVL3 (GOWN DISPOSABLE) ×2
LABEL OR SOLS (LABEL) IMPLANT
NS IRRIG 500ML POUR BTL (IV SOLUTION) ×2 IMPLANT
PACK HEAD/NECK (MISCELLANEOUS) ×2 IMPLANT
PATTIES SURGICAL .5 X3 (DISPOSABLE) ×2 IMPLANT
SOL ANTI-FOG 6CC FOG-OUT (MISCELLANEOUS) ×1 IMPLANT
SOL FOG-OUT ANTI-FOG 6CC (MISCELLANEOUS) ×1
SPLINT NASAL REUTER .5MM (MISCELLANEOUS) IMPLANT
SPLINT NASAL REUTER .5MM BIVLV (MISCELLANEOUS) ×2 IMPLANT
SPLINT NASAL SEPTAL PRE-CUT (MISCELLANEOUS) IMPLANT
SUT CHROMIC 4 0 RB 1X27 (SUTURE) ×2 IMPLANT
SUT ETH BLK MONO 3 0 FS 1 12/B (SUTURE) ×2 IMPLANT
SYR 30ML LL (SYRINGE) ×2 IMPLANT
SYR 3ML LL SCALE MARK (SYRINGE) ×2 IMPLANT
WATER STERILE IRR 1000ML POUR (IV SOLUTION) ×2 IMPLANT

## 2016-02-10 NOTE — Anesthesia Preprocedure Evaluation (Signed)
Anesthesia Evaluation  Patient identified by MRN, date of birth, ID band Patient awake    Reviewed: Allergy & Precautions, NPO status , Patient's Chart, lab work & pertinent test results  Airway Mallampati: III       Dental  (+) Teeth Intact   Pulmonary sleep apnea and Continuous Positive Airway Pressure Ventilation ,     + decreased breath sounds      Cardiovascular Exercise Tolerance: Good hypertension, Pt. on medications + Peripheral Vascular Disease   Rhythm:Regular Rate:Normal     Neuro/Psych    GI/Hepatic negative GI ROS, Neg liver ROS,   Endo/Other  diabetes, Type 2, Oral Hypoglycemic AgentsMorbid obesity  Renal/GU negative Renal ROS     Musculoskeletal negative musculoskeletal ROS (+)   Abdominal (+) + obese,   Peds  Hematology   Anesthesia Other Findings   Reproductive/Obstetrics                             Anesthesia Physical Anesthesia Plan  ASA: III  Anesthesia Plan: General   Post-op Pain Management:    Induction: Intravenous  Airway Management Planned: Oral ETT  Additional Equipment:   Intra-op Plan:   Post-operative Plan: Extubation in OR  Informed Consent: I have reviewed the patients History and Physical, chart, labs and discussed the procedure including the risks, benefits and alternatives for the proposed anesthesia with the patient or authorized representative who has indicated his/her understanding and acceptance.     Plan Discussed with: CRNA  Anesthesia Plan Comments:         Anesthesia Quick Evaluation

## 2016-02-10 NOTE — Transfer of Care (Signed)
Immediate Anesthesia Transfer of Care Note  Patient: Edward King  Procedure(s) Performed: Procedure(s): NASAL SEPTOPLASTY WITH INFERIOR TURBINATE REDUCTION (Bilateral)  Patient Location: PACU  Anesthesia Type:General  Level of Consciousness: awake, alert , oriented and patient cooperative  Airway & Oxygen Therapy: Patient Spontanous Breathing and Patient connected to face mask oxygen  Post-op Assessment: Report given to RN, Post -op Vital signs reviewed and stable and Patient moving all extremities X 4  Post vital signs: Reviewed and stable  Last Vitals:  Filed Vitals:   02/10/16 0840  BP: 126/81  Pulse: 68  Temp: 37.1 C  Resp: 16    Complications: No apparent anesthesia complications

## 2016-02-10 NOTE — Anesthesia Procedure Notes (Signed)
Procedure Name: Intubation Date/Time: 02/10/2016 9:39 AM Performed by: Silvana Newness Pre-anesthesia Checklist: Patient identified, Emergency Drugs available, Suction available, Patient being monitored and Timeout performed Patient Re-evaluated:Patient Re-evaluated prior to inductionOxygen Delivery Method: Circle system utilized Preoxygenation: Pre-oxygenation with 100% oxygen Intubation Type: IV induction Ventilation: Two handed mask ventilation required Laryngoscope Size: Mac and 4 Grade View: Grade II Tube type: Oral Tube size: 7.5 mm Number of attempts: 1 Airway Equipment and Method: Rigid stylet Placement Confirmation: ETT inserted through vocal cords under direct vision,  positive ETCO2 and breath sounds checked- equal and bilateral Secured at: 23 cm Tube secured with: Tape Dental Injury: Teeth and Oropharynx as per pre-operative assessment  Comments: ETT placed by Dr. [place] Service

## 2016-02-10 NOTE — Anesthesia Postprocedure Evaluation (Signed)
Anesthesia Post Note  Patient: Edward King  Procedure(s) Performed: Procedure(s) (LRB): NASAL SEPTOPLASTY WITH INFERIOR TURBINATE REDUCTION (Bilateral)  Patient location during evaluation: PACU Anesthesia Type: General Level of consciousness: awake Pain management: satisfactory to patient Vital Signs Assessment: post-procedure vital signs reviewed and stable Respiratory status: nonlabored ventilation Cardiovascular status: stable Anesthetic complications: no    Last Vitals:  Filed Vitals:   02/10/16 0840 02/10/16 1110  BP: 126/81 127/64  Pulse: 68 73  Temp: 37.1 C 36.4 C  Resp: 16 10    Last Pain:  Filed Vitals:   02/10/16 1115  PainSc: 0-No pain                 VAN STAVEREN,Genesis Paget

## 2016-02-10 NOTE — Discharge Instructions (Signed)
AMBULATORY SURGERY  °DISCHARGE INSTRUCTIONS ° ° °1) The drugs that you were given will stay in your system until tomorrow so for the next 24 hours you should not: ° °A) Drive an automobile °B) Make any legal decisions °C) Drink any alcoholic beverage ° ° °2) You may resume regular meals tomorrow.  Today it is better to start with liquids and gradually work up to solid foods. ° °You may eat anything you prefer, but it is better to start with liquids, then soup and crackers, and gradually work up to solid foods. ° ° °3) Please notify your doctor immediately if you have any unusual bleeding, trouble breathing, redness and pain at the surgery site, drainage, fever, or pain not relieved by medication. ° ° ° °4) Additional Instructions: ° ° ° ° ° ° ° °Please contact your physician with any problems or Same Day Surgery at [phone], Monday through Friday 6 am to 4 pm, or [place] at [facility] number at [phone]. °

## 2016-02-10 NOTE — H&P (Signed)
..  History and Physical paper copy reviewed and updated date of procedure and will be scanned into system.  

## 2016-02-10 NOTE — Op Note (Signed)
..02/10/2016  10:52 AM    Edward King  [id number]    Pre-Op Dx:  Deviated Nasal Septum, Hypertrophic Inferior Turbinates, recurrent epistaxis  Post-op Dx: Same  Proc: 1)  Nasal Septoplasty           2)  Bilateral Partial Reduction Inferior Turbinates   Surg:  Luree Palla  Anes:  GOT  EBL:  50  Comp:  none  Findings: Right and left sided septal deviation, bilateral inferior turbinate hypertrophy  Procedure: With the patient in a comfortable supine position,  general orotracheal anesthesia was induced without difficulty.  The patient received preoperative Afrin spray for topical decongestion and vasoconstriction.  At an appropriate level, the patient was placed in a semi-sitting position.  Nasal vibrissae were trimmed.   1% Xylocaine with 1:100,000 epinephrine, 5 cc's, was infiltrated into the anterior floor of the nose, into the nasal spine region, into the membranous columella, and finally into the submucoperichondrial plane of the septum on both sides.  Several minutes were allowed for this to take effect.  Cottoniod pledgetts soaked in Afrin were placed into both nasal cavities and left while the patient was prepped and draped in the standard fashion.   A proper time-out was performed.    The materials were removed from the nose and observed to be intact and correct in number.  The nose was inspected with a headlight and zero degree endoscope with the findings as described above.  The inferior turbinates were then inspected.  Under endoscopic visualization, the inferior turbinates were infractured bilaterally with a Soil scientist.  A kelly clamp was attached to the anterior-inferior third of each inferior turbinate for approximately one minute.  Under endoscopic visualization, Tru-cutting forceps were used to remove the anterior-inferior third of each inferior turbinate.  Electrocautery was used to control bleeding in the area. The remaining turbinate was then  outfractured to open up the airway further. There was no significant bleeding noted. The right turbinate was then trimmed and outfractured in a similar fashion.  A left Killian incision was sharply executed and carried down to the caudal edge of the quadrangular cartilage with a 15 blade scapel.  A mucoperichondrial flap was elelvated along the quadrangular plate back to the bony-cartilaginous junction using caudal elevator and freer elevator. The mucoperiostium was then elevated along the ethmoid plate and the vomer. An itracartilagenous incision was made using the freer elevator and a contralateral mucoperichondiral flap was elevated using a freer elevator.  Care was taken to avoid any large rents or opposing rents in the mucoperichondrial flap.  Boney spurs of the vomer and maxillary crest were removed with Takahashi forceps.  The area of cartilagenous deviation was removed with combination of freer elevator and Takahashi forceps creating a widely patent nasal cavity as well as resolution of obstruction from the cartilagenous deviation. The mucosal flaps were placed back into their anatomic position to allow visualization of the airways. The septum now sat in the midline with an improved airway.  A 4-0 Chromic was used to close the Oxford incision as well.   The airways were then visualized and showed open passageways on both sides that were significantly improved compared to before surgery.     There was no signifcant bleeding. Nasal splints were applied to both sides of the septum using Xomed 0.2m regular sized splints that were trimmed, and then held in position with a 3-0 Nylon through and through suture.  Stamberger sinufoam was placed along the cut edge of the inferior turbinates  bilaterally.  The patient was turned back over to anesthesia, and awakened, extubated, and taken to the PACU in satisfactory condition.  Dispo:   PACU to home  Plan: Ice, elevation, narcotic analgesia, and  prophylactic antibiotics for the duration of indwelling nasal foreign bodies.  We will reevaluate the patient in the office in 6 to 7 days and remove the septal splints.   strenuous activities in two weeks.   Edward King 02/10/2016 10:52 AM

## 2016-02-21 ENCOUNTER — Ambulatory Visit
Admission: RE | Admit: 2016-02-21 | Discharge: 2016-02-21 | Disposition: A | Discharge status: Home / Self Care | Payer: 59 | Source: Ambulatory Visit | Attending: Urology | Admitting: Urology

## 2016-02-21 DIAGNOSIS — N329 Bladder disorder, unspecified: Secondary | ICD-10-CM | POA: Insufficient documentation

## 2016-02-21 DIAGNOSIS — R918 Other nonspecific abnormal finding of lung field: Secondary | ICD-10-CM | POA: Insufficient documentation

## 2016-02-21 DIAGNOSIS — R310 Gross hematuria: Secondary | ICD-10-CM | POA: Insufficient documentation

## 2016-02-21 DIAGNOSIS — C61 Malignant neoplasm of prostate: Secondary | ICD-10-CM | POA: Diagnosis not present

## 2016-02-21 MED ORDER — IOHEXOL 350 MG/ML SOLN
125.0000 mL | Freq: Once | INTRAVENOUS | Status: AC | PRN
  Administered 2016-02-21: 125 mL via INTRAVENOUS

## 2016-02-23 DIAGNOSIS — D400 Neoplasm of uncertain behavior of prostate: Secondary | ICD-10-CM | POA: Diagnosis not present

## 2016-02-23 DIAGNOSIS — R310 Gross hematuria: Secondary | ICD-10-CM | POA: Diagnosis not present

## 2016-02-23 DIAGNOSIS — R300 Dysuria: Secondary | ICD-10-CM | POA: Diagnosis not present

## 2016-02-23 DIAGNOSIS — M316 Other giant cell arteritis: Secondary | ICD-10-CM | POA: Diagnosis not present

## 2016-02-29 ENCOUNTER — Ambulatory Visit (INDEPENDENT_AMBULATORY_CARE_PROVIDER_SITE_OTHER): Payer: 59 | Admitting: Family Medicine

## 2016-02-29 ENCOUNTER — Encounter: Payer: Self-pay | Admitting: Family Medicine

## 2016-02-29 VITALS — BP 112/60 | HR 83 | Temp 98.7°F | Resp 16 | Ht 71.0 in | Wt 289.0 lb

## 2016-02-29 DIAGNOSIS — R918 Other nonspecific abnormal finding of lung field: Secondary | ICD-10-CM | POA: Insufficient documentation

## 2016-02-29 NOTE — Progress Notes (Signed)
Patient: Edward King Male    DO: [birth year]   65 y.o.   MRN: [id number] Visit Date: 02/29/2016  Today's Provider: Lelon Huh, MD   Chief Complaint  Patient presents with  . Results   Subjective:    HPI  Patient wants to discuss results of lung nodule that was found on CT scan that was done on 02/21/2016. Ct scan was ordered by Dr. Eliberto Ivory due to gross hematuria. CT revealed nodules in right lung base measuring up to 1.cm , new from previous CT 05/22/2007. Difficult to definitively exclude metastatic disease. Consider chest w/o contrast in further evaluation. He was last seen by Pulmonologist in 2014  No Known Allergies Previous Medications   CITALOPRAM (CELEXA) 20 MG TABLET    Take 30 mg by mouth daily.    FOLIC ACID (FOLVITE) 1 MG TABLET    Take by mouth.   GABAPENTIN (NEURONTIN) 300 MG CAPSULE    TAKE 2 CAPSULES BY MOUTH IN THE MORNING AND 3 CAPSULES AT BEDTIME   GENTAMICIN OINTMENT (GARAMYCIN) 0.1 %    Apply 1 application topically 2 (two) times daily. Apply to nostril   GLIPIZIDE (GLUCOTROL XL) 2.5 MG 24 HR TABLET    TAKE 1 TABLET BY MOUTH ONCE DAILY   HYDROCODONE-ACETAMINOPHEN (NORCO) 5-325 MG TABLET    Take 1 tablet by mouth every 4 (four) hours as needed for moderate pain.   METFORMIN (GLUCOPHAGE-XR) 500 MG 24 HR TABLET    TAKE 2 TABLETS BY MOUTH TWO TIMES DAILY   MULTIPLE VITAMIN (MULTIVITAMIN WITH MINERALS) TABS TABLET    Take 1 tablet by mouth daily.   PROMETHAZINE (PHENERGAN) 12.5 MG TABLET    Take 1 tablet (12.5 mg total) by mouth every 6 (six) hours as needed for nausea or vomiting.   SULFAMETHOXAZOLE-TRIMETHOPRIM (BACTRIM DS,SEPTRA DS) 800-160 MG TABLET    Take 1 tablet by mouth 2 (two) times daily.   TAMSULOSIN (FLOMAX) 0.4 MG CAPS CAPSULE    Take 0.4 mg by mouth daily after supper.   VALSARTAN-HYDROCHLOROTHIAZIDE (DIOVAN-HCT) 80-12.5 MG PER TABLET    Take 1 tablet by mouth daily.    Review of Systems  Constitutional: Negative for fever, chills and appetite  change.  Respiratory: Negative for chest tightness, shortness of breath and wheezing.   Cardiovascular: Negative for chest pain and palpitations.  Gastrointestinal: Negative for nausea, vomiting and abdominal pain.    Social History  Substance Use Topics  . Smoking status: Never Smoker   . Smokeless tobacco: Never Used  . Alcohol Use: 0.0 oz/week    0 Standard drinks or equivalent per week     Comment: Occasional use   Objective:   BP 112/60 mmHg  Pulse 83  Temp(Src) 98.7 F (37.1 C) (Oral)  Resp 16  Ht 5' 11"  (1.803 m)  Wt 289 lb (131.09 kg)  BMI 40.33 kg/m2  SpO2 96%  Physical Exam  General appearance: alert, well developed, well nourished, cooperative and in no distress Head: Normocephalic, without obvious abnormality, atraumatic Lungs: Respirations even and unlabored Extremities: No gross deformities Skin: Skin color, texture, turgor normal. No rashes seen  Psych: Appropriate mood and affect. Neurologic: Mental status: Alert, oriented to person, place, and time, thought content appropriate.     Assessment & Plan:     1. Multiple lung nodules on CT Incidental finding on abdominal pelvic CT.  - CT Chest Wo Contrast; Future        Lelon Huh, MD  [facility]  [place]

## 2016-03-03 DIAGNOSIS — M316 Other giant cell arteritis: Secondary | ICD-10-CM | POA: Diagnosis not present

## 2016-03-03 DIAGNOSIS — E119 Type 2 diabetes mellitus without complications: Secondary | ICD-10-CM | POA: Diagnosis not present

## 2016-03-03 DIAGNOSIS — N4282 Prostatosis syndrome: Secondary | ICD-10-CM | POA: Diagnosis not present

## 2016-03-03 DIAGNOSIS — R918 Other nonspecific abnormal finding of lung field: Secondary | ICD-10-CM | POA: Diagnosis not present

## 2016-03-06 ENCOUNTER — Ambulatory Visit
Admission: RE | Admit: 2016-03-06 | Discharge: 2016-03-06 | Disposition: A | Discharge status: Home / Self Care | Payer: 59 | Source: Ambulatory Visit | Attending: Family Medicine | Admitting: Family Medicine

## 2016-03-06 DIAGNOSIS — R911 Solitary pulmonary nodule: Secondary | ICD-10-CM | POA: Diagnosis not present

## 2016-03-06 DIAGNOSIS — R918 Other nonspecific abnormal finding of lung field: Secondary | ICD-10-CM

## 2016-03-06 DIAGNOSIS — I2510 Atherosclerotic heart disease of native coronary artery without angina pectoris: Secondary | ICD-10-CM | POA: Insufficient documentation

## 2016-03-07 ENCOUNTER — Telehealth: Payer: Self-pay | Admitting: Family Medicine

## 2016-03-07 DIAGNOSIS — R5383 Other fatigue: Secondary | ICD-10-CM

## 2016-03-07 NOTE — Telephone Encounter (Signed)
Pt stated that he would like to speak with a nurse today if possible. Pt stated he is feeling fatigued and thought he might need to have labs done. Thanks TNP

## 2016-03-07 NOTE — Telephone Encounter (Signed)
Patient reports that he has felt really tired for the last 2 weeks. He reports that his symptoms have significantly gotten worse over the last 4 days. Patient thinks that he needs labs drawn to see if he may have something physiological going on. Patient was scheduled an appt to see you on tomorrow (03/08/16) at 4:15. Patient wanted to know if he could get his labs done earlier that day before his appt? He reports that since his appt is late in the afternoon, he wants to get his labs done before they close. Ok to print out lab slip? Please advise. Thanks!

## 2016-03-07 NOTE — Telephone Encounter (Signed)
Have printed lab order, please advise patient and leave at front desk to pick up.

## 2016-03-08 ENCOUNTER — Ambulatory Visit (INDEPENDENT_AMBULATORY_CARE_PROVIDER_SITE_OTHER): Payer: 59 | Admitting: Family Medicine

## 2016-03-08 ENCOUNTER — Encounter: Payer: Self-pay | Admitting: Family Medicine

## 2016-03-08 VITALS — BP 118/70 | HR 70 | Temp 98.4°F | Resp 18 | Wt 287.0 lb

## 2016-03-08 DIAGNOSIS — R300 Dysuria: Secondary | ICD-10-CM | POA: Diagnosis not present

## 2016-03-08 DIAGNOSIS — R5383 Other fatigue: Secondary | ICD-10-CM

## 2016-03-08 DIAGNOSIS — G4733 Obstructive sleep apnea (adult) (pediatric): Secondary | ICD-10-CM

## 2016-03-08 LAB — POCT URINALYSIS DIPSTICK
BILIRUBIN UA: NEGATIVE
Blood, UA: NEGATIVE
GLUCOSE UA: NEGATIVE
Ketones, UA: NEGATIVE
LEUKOCYTES UA: NEGATIVE
NITRITE UA: NEGATIVE
Protein, UA: NEGATIVE
Spec Grav, UA: 1.02
Urobilinogen, UA: 0.2
pH, UA: 6.5

## 2016-03-08 NOTE — Progress Notes (Signed)
Patient: Edward King Male    DOB: November 12, 1951   65 y.o.   MRN: [id number] Visit Date: 03/08/2016  Today's Provider: Lelon Huh, MD   Chief Complaint  Patient presents with  . Fatigue   Subjective:    HPI Fatigue: Patient comes in today stating he has been extremely fatigued for the past 2-3 weeks. Patient states it seems to be getting worse. Fatigue is described as a constant tiredness and exhaustion. Patient feels that at the end of the day he just wants to lay down. Patient says even after sleeping he still feels fatigued and restless. He does have a history of sleep apnea and tried CPAP for a short period of time when he was initially diagnoses, but he did not tolerate it. He states that excessive sleepiness resolved after losing weight, and doesn't feel that current level of fatigue is similar.   Complains of having to urinate frequently and burning     No Known Allergies Previous Medications   CITALOPRAM (CELEXA) 20 MG TABLET    Take 30 mg by mouth daily.    FOLIC ACID (FOLVITE) 1 MG TABLET    Take by mouth.   GABAPENTIN (NEURONTIN) 300 MG CAPSULE    TAKE 2 CAPSULES BY MOUTH IN THE MORNING AND 3 CAPSULES AT BEDTIME   GLIPIZIDE (GLUCOTROL XL) 2.5 MG 24 HR TABLET    TAKE 1 TABLET BY MOUTH ONCE DAILY   HYDROCODONE-ACETAMINOPHEN (NORCO) 5-325 MG TABLET    Take 1 tablet by mouth every 4 (four) hours as needed for moderate pain.   METFORMIN (GLUCOPHAGE-XR) 500 MG 24 HR TABLET    TAKE 2 TABLETS BY MOUTH TWO TIMES DAILY   MULTIPLE VITAMIN (MULTIVITAMIN WITH MINERALS) TABS TABLET    Take 1 tablet by mouth daily.   TAMSULOSIN (FLOMAX) 0.4 MG CAPS CAPSULE    Take 0.4 mg by mouth daily after supper. Reported on 03/08/2016   VALSARTAN-HYDROCHLOROTHIAZIDE (DIOVAN-HCT) 80-12.5 MG PER TABLET    Take 1 tablet by mouth daily.    Review of Systems  Constitutional: Positive for fatigue. Negative for fever, chills and appetite change.  Respiratory: Negative for chest tightness, shortness  of breath and wheezing.   Cardiovascular: Negative for chest pain and palpitations.  Gastrointestinal: Negative for nausea, vomiting and abdominal pain.  Musculoskeletal: Positive for neck pain (has tension in his neck).  Neurological: Positive for numbness (in thumb and index finger of left hand).    Social History  Substance Use Topics  . Smoking status: Never Smoker   . Smokeless tobacco: Never Used  . Alcohol Use: 0.0 oz/week    0 Standard drinks or equivalent per week     Comment: Occasional use   Objective:   BP 118/70 mmHg  Pulse 70  Temp(Src) 98.4 F (36.9 C) (Oral)  Resp 18  Wt 287 lb (130.182 kg)  SpO2 96%  Physical Exam  General Appearance:    Alert, cooperative, no distress, obese  Eyes:    PERRL, conjunctiva/corneas clear, EOM's intact       Lungs:     Clear to auscultation bilaterally, respirations unlabored  Heart:    Regular rate and rhythm, no murmurs.   Neurologic:   Awake, alert, oriented x 3. No apparent focal neurological           defect.       Results for orders placed or performed in visit on 03/08/16  POCT urinalysis dipstick  Result Value Ref Range  Color, UA yellow    Clarity, UA clear    Glucose, UA negative    Bilirubin, UA negative    Ketones, UA negative    Spec Grav, UA 1.020    Blood, UA negative    pH, UA 6.5    Protein, UA negative    Urobilinogen, UA 0.2    Nitrite, UA negative    Leukocytes, UA Negative Negative    Epworth=7    Assessment & Plan:     1. Other fatigue  - Comprehensive metabolic panel - CBC - Troponin I - T4 AND TSH - Testosterone,Free and Total   2. Dysuria  - POCT urinalysis dipstick   3. Obstructive sleep apnea Not currently on CPAP. Consider repeat sleep study if labs are negative.       Lelon Huh, MD  [facility]

## 2016-03-08 NOTE — Telephone Encounter (Signed)
Advised patient as below.  

## 2016-03-09 DIAGNOSIS — R5383 Other fatigue: Secondary | ICD-10-CM | POA: Diagnosis not present

## 2016-03-10 ENCOUNTER — Telehealth: Payer: Self-pay | Admitting: *Deleted

## 2016-03-10 DIAGNOSIS — E291 Testicular hypofunction: Secondary | ICD-10-CM | POA: Diagnosis not present

## 2016-03-10 DIAGNOSIS — R7989 Other specified abnormal findings of blood chemistry: Secondary | ICD-10-CM

## 2016-03-10 NOTE — Telephone Encounter (Signed)
Patient was notified of results. Patient expressed understanding. Labs ordered.

## 2016-03-10 NOTE — Telephone Encounter (Signed)
-----   Message from Birdie Sons, MD sent at 03/10/2016  7:57 AM EDT ----- Testosterone levels are very low which is probably causing him to be more fatigued. Recommend trial of Androgel. To cover medication, insurance requires that we get a repeat testosterone (free and total) and check prolactin levels.

## 2016-03-11 LAB — COMPREHENSIVE METABOLIC PANEL
A/G RATIO: 1.9 (ref 1.2–2.2)
ALBUMIN: 4 g/dL (ref 3.6–4.8)
ALK PHOS: 69 IU/L (ref 39–117)
ALT: 11 IU/L (ref 0–44)
AST: 12 IU/L (ref 0–40)
BILIRUBIN TOTAL: 0.4 mg/dL (ref 0.0–1.2)
BUN / CREAT RATIO: 23 — AB (ref 10–22)
BUN: 21 mg/dL (ref 8–27)
CHLORIDE: 100 mmol/L (ref 96–106)
CO2: 26 mmol/L (ref 18–29)
Calcium: 9.2 mg/dL (ref 8.6–10.2)
Creatinine, Ser: 0.9 mg/dL (ref 0.76–1.27)
GFR calc non Af Amer: 89 mL/min/{1.73_m2} (ref >59)
GFR, EST AFRICAN AMERICAN: 103 mL/min/{1.73_m2} (ref >59)
GLOBULIN, TOTAL: 2.1 g/dL (ref 1.5–4.5)
GLUCOSE: 110 mg/dL — AB (ref 65–99)
POTASSIUM: 4.5 mmol/L (ref 3.5–5.2)
SODIUM: 141 mmol/L (ref 134–144)
Total Protein: 6.1 g/dL (ref 6.0–8.5)

## 2016-03-11 LAB — CBC
Hematocrit: 41.3 % (ref 37.5–51.0)
Hemoglobin: 13.9 g/dL (ref 12.6–17.7)
MCH: 29.2 pg (ref 26.6–33.0)
MCHC: 33.7 g/dL (ref 31.5–35.7)
MCV: 87 fL (ref 79–97)
PLATELETS: 223 10*3/uL (ref 150–379)
RBC: 4.76 x10E6/uL (ref 4.14–5.80)
RDW: 14.8 % (ref 12.3–15.4)
WBC: 8.8 10*3/uL (ref 3.4–10.8)

## 2016-03-11 LAB — TESTOSTERONE,FREE AND TOTAL
Testosterone, Free: 2 pg/mL — ABNORMAL LOW (ref 6.6–18.1)
Testosterone: 100 ng/dL — ABNORMAL LOW (ref 348–1197)

## 2016-03-11 LAB — T4 AND TSH
T4 TOTAL: 8.2 ug/dL (ref 4.5–12.0)
TSH: 2.44 u[IU]/mL (ref 0.450–4.500)

## 2016-03-11 LAB — TROPONIN I: Troponin I: <0.01 ng/mL (ref 0.00–0.04)

## 2016-03-12 LAB — PROLACTIN: PROLACTIN: 9.6 ng/mL (ref 4.0–15.2)

## 2016-03-12 LAB — TESTOSTERONE,FREE AND TOTAL
Testosterone, Free: 1.5 pg/mL — ABNORMAL LOW (ref 6.6–18.1)
Testosterone: 167 ng/dL — ABNORMAL LOW (ref 348–1197)

## 2016-03-13 ENCOUNTER — Telehealth: Payer: Self-pay

## 2016-03-13 DIAGNOSIS — E291 Testicular hypofunction: Secondary | ICD-10-CM | POA: Diagnosis not present

## 2016-03-13 DIAGNOSIS — D400 Neoplasm of uncertain behavior of prostate: Secondary | ICD-10-CM | POA: Diagnosis not present

## 2016-03-13 DIAGNOSIS — F331 Major depressive disorder, recurrent, moderate: Secondary | ICD-10-CM | POA: Diagnosis not present

## 2016-03-13 DIAGNOSIS — C61 Malignant neoplasm of prostate: Secondary | ICD-10-CM | POA: Diagnosis not present

## 2016-03-13 NOTE — Telephone Encounter (Signed)
-----   Message from Birdie Sons, MD sent at 03/12/2016  1:01 PM EDT ----- Labs confirm very low testosterone level. Recommend starting androgel 1.62% gelpump, 2 actuations daily., 75grams, 1 refill. Follow up ov. And labs 4 weeks after starting androgel.

## 2016-03-13 NOTE — Telephone Encounter (Signed)
Advised patient as below. Patient reports that his urologist (Dr. Rogers Blocker) told him that he is unable to take any testosterone supplements until he is 3 years cancer free. Patient reports that he has history of prostate cancer. Patient is wanting to know what to do because he think the Androgel would be beneficial, but per his urologist he can not take it. Please advise. Thanks!

## 2016-03-17 ENCOUNTER — Telehealth: Payer: Self-pay | Admitting: Family Medicine

## 2016-03-17 NOTE — Telephone Encounter (Signed)
Patients wife called office requesting a call back from Dr. Sabino Snipes nurse today. She would not state what this was in regards too. KW

## 2016-03-17 NOTE — Telephone Encounter (Signed)
Patient was notified. Patient stated that his psychiatrist Dr. Thurmond Butts of [facility], prescribed Wellbutrin SR 150 mg qd a few days ago. Patient stated that he is going to try medication and sere if he has any improvement.

## 2016-03-17 NOTE — Telephone Encounter (Signed)
There are not any other medications to raise testosterone levels, but some antidepressants can help offset the symptoms of low testosterone such as fatigue, low energy and low libido. . Recommend he try bupropion SR (12 hour) 167m one tablet daily for 3 days, then increase to BID. #60, rf x 1 and follow up in 4 weeks.

## 2016-03-17 NOTE — Telephone Encounter (Signed)
Please advise 

## 2016-03-17 NOTE — Telephone Encounter (Signed)
Returned call to pt's wife [place]. [place] wanted to know if pt's TSH was checked. Confirmed that it was.

## 2016-03-28 DIAGNOSIS — M316 Other giant cell arteritis: Secondary | ICD-10-CM | POA: Diagnosis not present

## 2016-03-29 DIAGNOSIS — Z79899 Other long term (current) drug therapy: Secondary | ICD-10-CM | POA: Diagnosis not present

## 2016-03-29 DIAGNOSIS — M316 Other giant cell arteritis: Secondary | ICD-10-CM | POA: Diagnosis not present

## 2016-04-03 DIAGNOSIS — E119 Type 2 diabetes mellitus without complications: Secondary | ICD-10-CM | POA: Diagnosis not present

## 2016-04-03 DIAGNOSIS — R918 Other nonspecific abnormal finding of lung field: Secondary | ICD-10-CM | POA: Diagnosis not present

## 2016-04-03 DIAGNOSIS — M316 Other giant cell arteritis: Secondary | ICD-10-CM | POA: Diagnosis not present

## 2016-04-03 DIAGNOSIS — Z79899 Other long term (current) drug therapy: Secondary | ICD-10-CM | POA: Diagnosis not present

## 2016-04-03 DIAGNOSIS — F331 Major depressive disorder, recurrent, moderate: Secondary | ICD-10-CM | POA: Diagnosis not present

## 2016-04-10 ENCOUNTER — Other Ambulatory Visit: Payer: Self-pay | Admitting: Family Medicine

## 2016-04-12 DIAGNOSIS — F331 Major depressive disorder, recurrent, moderate: Secondary | ICD-10-CM | POA: Diagnosis not present

## 2016-04-17 ENCOUNTER — Emergency Department
Admission: EM | Admit: 2016-04-17 | Discharge: 2016-04-17 | Disposition: A | Discharge status: Home / Self Care | Payer: 59 | Attending: Emergency Medicine | Admitting: Emergency Medicine

## 2016-04-17 DIAGNOSIS — Z79899 Other long term (current) drug therapy: Secondary | ICD-10-CM | POA: Diagnosis not present

## 2016-04-17 DIAGNOSIS — F419 Anxiety disorder, unspecified: Secondary | ICD-10-CM | POA: Diagnosis not present

## 2016-04-17 DIAGNOSIS — F331 Major depressive disorder, recurrent, moderate: Secondary | ICD-10-CM | POA: Diagnosis not present

## 2016-04-17 DIAGNOSIS — I10 Essential (primary) hypertension: Secondary | ICD-10-CM | POA: Insufficient documentation

## 2016-04-17 DIAGNOSIS — E119 Type 2 diabetes mellitus without complications: Secondary | ICD-10-CM | POA: Diagnosis not present

## 2016-04-17 DIAGNOSIS — F410 Panic disorder [episodic paroxysmal anxiety] without agoraphobia: Secondary | ICD-10-CM | POA: Insufficient documentation

## 2016-04-17 DIAGNOSIS — R5382 Chronic fatigue, unspecified: Secondary | ICD-10-CM | POA: Insufficient documentation

## 2016-04-17 DIAGNOSIS — Z7984 Long term (current) use of oral hypoglycemic drugs: Secondary | ICD-10-CM | POA: Diagnosis not present

## 2016-04-17 DIAGNOSIS — F329 Major depressive disorder, single episode, unspecified: Secondary | ICD-10-CM | POA: Diagnosis not present

## 2016-04-17 DIAGNOSIS — F32A Depression, unspecified: Secondary | ICD-10-CM

## 2016-04-17 DIAGNOSIS — R5383 Other fatigue: Secondary | ICD-10-CM | POA: Diagnosis not present

## 2016-04-17 LAB — CBC WITH DIFFERENTIAL/PLATELET
Basophils Absolute: 0 10*3/uL (ref 0–0.1)
Basophils Relative: 0 %
EOS PCT: 1 %
Eosinophils Absolute: 0.1 10*3/uL (ref 0–0.7)
HEMATOCRIT: 43.8 % (ref 40.0–52.0)
HEMOGLOBIN: 14.6 g/dL (ref 13.0–18.0)
LYMPHS ABS: 1 10*3/uL (ref 1.0–3.6)
LYMPHS PCT: 12 %
MCH: 29.5 pg (ref 26.0–34.0)
MCHC: 33.2 g/dL (ref 32.0–36.0)
MCV: 88.7 fL (ref 80.0–100.0)
MONOS PCT: 5 %
Monocytes Absolute: 0.4 10*3/uL (ref 0.2–1.0)
NEUTROS PCT: 82 %
Neutro Abs: 7.2 10*3/uL — ABNORMAL HIGH (ref 1.4–6.5)
Platelets: 188 10*3/uL (ref 150–440)
RBC: 4.94 MIL/uL (ref 4.40–5.90)
RDW: 15.7 % — ABNORMAL HIGH (ref 11.5–14.5)
WBC: 8.7 10*3/uL (ref 3.8–10.6)

## 2016-04-17 LAB — BASIC METABOLIC PANEL WITH GFR
Anion gap: 10 (ref 5–15)
BUN: 24 mg/dL — ABNORMAL HIGH (ref 6–20)
CO2: 27 mmol/L (ref 22–32)
Calcium: 9.1 mg/dL (ref 8.9–10.3)
Chloride: 104 mmol/L (ref 101–111)
Creatinine, Ser: 1.1 mg/dL (ref 0.61–1.24)
GFR calc Af Amer: >60 mL/min
GFR calc non Af Amer: >60 mL/min
Glucose, Bld: 124 mg/dL — ABNORMAL HIGH (ref 65–99)
Potassium: 4.1 mmol/L (ref 3.5–5.1)
Sodium: 141 mmol/L (ref 135–145)

## 2016-04-17 LAB — TROPONIN I: Troponin I: <0.03 ng/mL

## 2016-04-17 NOTE — ED Notes (Signed)
Pt c/o depression, anxiety worse over the past week, has no energy, not wanting to get out of bed for the past week.. C/o dizziness, states it is related to the panic attacks, has been taking lorazepam and antidepressants without any relief. Pt reports to this RN that he does not have any thoughts of SI or HI at this time. Wife at bedside. Labs obtained and sent for results. Continue to monitor.

## 2016-04-17 NOTE — ED Provider Notes (Signed)
[facility] Emergency Department Provider Note   ____________________________________________  Time seen: Approximately 11:17 AM  I have reviewed the triage vital signs and the nursing notes.   HISTORY  Chief Complaint Anxiety; Depression; and Dizziness    HPI Edward King is a 65 y.o. male with a history that includes anxiety and panic attacks, diabetes,hypertension, and prostate cancer who presents with gradually worsening anxiety and depression and panic attacks over at least the last 6 weeks.  He reports, and it is well documented in the patient's medical record, that he has been having these issues for 1-2 months, and his psychiatrist and primary care doctor have both seen him multiple times in this time.  His psychiatrist is been titrating and changing medicines in an attempt to help him but so far he has not shown significant improvement.  The issue that brings him to the emergency department today is that his panic attacks seem to be happening with greater frequency.  He had a panic attack earlier this morning where he was very worried about everything and it felt typical of his usual, and that he had some chest tightness and difficulty breathing.  After he took lorazepam 0.5 mg by mouth his symptoms were alleviated, but when they call the psychiatrist's office, the office staff reportedly suggested that they go to the emergency department for evaluation.  Patient also reports that he has a psychiatry appointment this afternoon at 4:30 PM.  The patient denies fever/chills, vomiting, abdominal pain.  She has had some diarrhea intermittently over the last 1-2 months as well as some nausea.  He has been experiencing a lot of stressors in his life lately which his wife confirms which is likely exacerbating his chronic anxiety.  He has not had any hallucinations, suicidal ideations, or homicidal ideations.   Past Medical History  Diagnosis Date  . History of  chicken pox   . History of measles   . History of mumps   . Temporal arteritis (HCC)     Clinically dx Dr. Jefm Bryant. Negative temporal artrtery bx  . Lymph edema     both legs.  mostly left  . 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 Diabetes mellitus without complication ([place])   . Prostate CA ([place]) 2016    Radiation seed implants.  . Hypertension     Patient Active Problem List   Diagnosis Date Noted  . Multiple lung nodules on CT 02/29/2016  . 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 Hypertension 06/16/2015  . Acquired lymphedema 06/16/2015  . CA of prostate ([place]) 06/16/2015  . Obstructive sleep apnea 06/16/2015  . 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 Cough 02/28/2013  . Peripheral edema 02/28/2013    Past Surgical History  Procedure Laterality Date  . Temporal artery biopsy / ligation Left   . Doppler echocardiography  08/29/2014    EF=60-65%. Normal LVEF  . Carotid doppler ultrasound  08/22/2014    [facility]; Minimal plaque right. Minimal thisckening left Anterograde flow vertebrals  . Ct of the head  08/22/2014    [facility]. Normal  . Myocardial perfusion scan  09/10/2012    Poor exercsie tolerance, but no evidence of stress induced myocardial ischemia. EF=66%. Dr. Clayborn Bigness  . Abdominoplasty    . Nasal septoplasty w/ turbinoplasty Bilateral 02/10/2016    Procedure: NASAL SEPTOPLASTY WITH INFERIOR TURBINATE REDUCTION;  Surgeon: Carloyn Manner, MD;  Location: [facility] ORS;  Service: ENT;  Laterality: Bilateral;    Current Outpatient Rx  Name  Route  Sig  Dispense  Refill  . buPROPion (WELLBUTRIN SR) 150 MG 12 hr tablet   Oral   Take 150 mg by mouth daily.         . citalopram (CELEXA) 20 MG tablet   Oral   Take 30 mg by mouth daily.          . folic acid (FOLVITE) 1 MG tablet   Oral   Take by mouth.         . gabapentin (NEURONTIN) 300 MG capsule      TAKE 2 CAPSULES BY MOUTH IN THE MORNING AND 3 CAPSULES AT BEDTIME   150 capsule    5   . glipiZIDE (GLUCOTROL XL) 2.5 MG 24 hr tablet      TAKE 1 TABLET BY MOUTH ONCE DAILY   90 tablet   4   . HYDROcodone-acetaminophen (NORCO) 5-325 MG tablet   Oral   Take 1 tablet by mouth every 4 (four) hours as needed for moderate pain.   40 tablet   0   . metFORMIN (GLUCOPHAGE-XR) 500 MG 24 hr tablet      TAKE 2 TABLETS BY MOUTH TWO TIMES DAILY   360 tablet   3   . Multiple Vitamin (MULTIVITAMIN WITH MINERALS) TABS tablet   Oral   Take 1 tablet by mouth daily.         . predniSONE (DELTASONE) 5 MG tablet   Oral   Take 0.5 tablets by mouth daily.         . tamsulosin (FLOMAX) 0.4 MG CAPS capsule   Oral   Take 0.4 mg by mouth daily after supper. Reported on 03/08/2016         . valsartan-hydrochlorothiazide (DIOVAN-HCT) 80-12.5 MG tablet      TAKE 1 TABLET BY MOUTH ONCE DAILY   90 tablet   4     Allergies Review of patient's allergies indicates no known allergies.  Family History  Problem Relation Age of Onset  . Colon cancer Mother   . Coronary artery disease Mother   . Prostate cancer Father   . Kidney disease Father   . Dementia Brother   . Diabetes Neg Hx     Social History Social History  Substance Use Topics  . Smoking status: Never Smoker   . Smokeless tobacco: Never Used  . Alcohol Use: No     Comment: Occasional use    Review of Systems Constitutional: No fever/chills Eyes: No visual changes. ENT: No sore throat. Cardiovascular: Chest tightness during panic attacks, otherwise asymptomatic Respiratory: shortness of breath during panic attacks, otherwise asymptomic Gastrointestinal: No abdominal pain.  No nausea, no vomiting.  No diarrhea.  No constipation. Genitourinary: Negative for dysuria. Musculoskeletal: Negative for back pain. Skin: Negative for rash. Neurological: Negative for headaches, focal weakness or numbness. Psych:  Persistent generalized fatigue for months with increasing depression and anxiety  10-point ROS  otherwise negative.  ____________________________________________   PHYSICAL EXAM:  VITAL SIGNS: ED Triage Vitals  Enc Vitals Group     BP 04/17/16 0839 126/80 mmHg     Pulse Rate 04/17/16 0839 71     Resp 04/17/16 0839 18     Temp 04/17/16 0839 97.9 F (36.6 C)     Temp Source 04/17/16 0839 Oral     SpO2 04/17/16 0839 98 %     Weight 04/17/16 0839 275 lb (124.739 kg)     Height 04/17/16 0839 5' 11"  (1.803 m)  Head Cir --      Peak Flow --      Pain Score --      Pain Loc --      Pain Edu? --      Excl. in [place]? --     Constitutional: Alert and oriented. Well appearing and in no acute distress.  Appears calm and relaxed. Eyes: Conjunctivae are normal. PERRL. EOMI. Head: Atraumatic. Nose: No congestion/rhinnorhea. Mouth/Throat: Mucous membranes are moist.  Oropharynx non-erythematous. Neck: No stridor.  No meningeal signs.   Cardiovascular: Normal rate, regular rhythm. Good peripheral circulation. Grossly normal heart sounds.   Respiratory: Normal respiratory effort.  No retractions. Lungs CTAB. Gastrointestinal: Soft and nontender. No distention.  Musculoskeletal: No lower extremity tenderness nor edema. No gross deformities of extremities. Neurologic:  Normal speech and language. No gross focal neurologic deficits are appreciated.  Skin:  Skin is warm, dry and intact. No rash noted. Psychiatric: Mood and affect are normal. Speech and behavior are normal.  Denies SI/HI.  ____________________________________________   LABS (all labs ordered are listed, but only abnormal results are displayed)  Labs Reviewed  BASIC METABOLIC PANEL - Abnormal; Notable for the following:    Glucose, Bld 124 (*)    BUN 24 (*)    All other components within normal limits  CBC WITH DIFFERENTIAL/PLATELET - Abnormal; Notable for the following:    RDW 15.7 (*)    Neutro Abs 7.2 (*)    All other components within normal limits  TROPONIN I    ____________________________________________  EKG  ED ECG REPORT I, Sabrinna Yearwood, the attending physician, personally viewed and interpreted this ECG.  Date: 04/17/2016 EKG Time: 12:05 Rate: 57 Rhythm: normal sinus rhythm QRS Axis: normal Intervals: normal ST/T Wave abnormalities: Non-specific ST segment / T-wave changes, but no evidence of acute ischemia. Conduction Disturbances: none Narrative Interpretation: unremarkable  ____________________________________________  RADIOLOGY   No results found.  ____________________________________________   PROCEDURES  Procedure(s) performed: None  Critical Care performed: No ____________________________________________   INITIAL IMPRESSION / ASSESSMENT AND PLAN / ED COURSE  Pertinent labs & imaging results that were available during my care of the patient were reviewed by me and considered in my medical decision making (see chart for details).  I explained to the patient and his wife that his symptoms are chronic and that he is under the care of two providers, his psychiatrist and his primary care doctor, and I would be reluctant to change any medications after meeting him once, particularly when the work with psychiatry as an ongoing process.  I will attempt to rule out an acute medical problem today by performing some basic lab work and checking an EKG, but I strongly doubt a medical cause of his symptoms that will be elucidated in the emergency department.  He had extensive lab work done at his primary care doctor's office about 5 weeks ago and if there is no sign of ACS or other acute medical abnormality today, I will discharge him for follow-up with the psychiatrist this afternoon.  He and his wife understand and agree with his plan.   ----------------------------------------- 1:24 PM on 04/17/2016 -----------------------------------------  The patient feels fine at this time, is ambulating around the room without any  difficulty.  The patient psychiatry appointment has been moved up to 3:30 PM and I encouraged them to keep that appointment as scheduled.  He has no evidence of acute medical issue at this time and I am discharging him for outpatient psychiatric follow-up.  He and his wife understand and agree with this plan.  _______________________________________  FINAL CLINICAL IMPRESSION(S) / ED DIAGNOSES  Final diagnoses:  Panic attacks  Anxiety  Depression  Chronic fatigue     MEDICATIONS GIVEN AND/OR PRESCRIBED DURING THIS VISIT:  Medications - No data to display   NEW OUTPATIENT MEDICATIONS STARTED DURING THIS VISIT:  New Prescriptions   No medications on file      Note:  This document was prepared using Dragon voice recognition software and may include unintentional dictation errors.   Hinda Kehr, MD 04/17/16 1326

## 2016-04-17 NOTE — ED Notes (Signed)
Pt c/o depression, anxiety worse over the past week, has no energy, not wanting to get out of bed for the past week.. C/o dizziness, states it is related to the panic attacks, has been taking lorazepam and antidepressants without any relief.

## 2016-04-17 NOTE — Discharge Instructions (Signed)
As we discussed, your symptoms appear to be psychiatric in nature, and given that you are under the care of his psychiatrist and you do not meet any criteria for involuntary commitment or inpatient treatment at this time, we believe that you should follow up as scheduled this afternoon with Dr. Thurmond Butts.  Your medical workup today was reassuring and does not indicate any acute medical issue at this time.   Panic Attacks Panic attacks are sudden, short-livedsurges of severe anxiety, fear, or discomfort. They may occur for no reason when you are relaxed, when you are anxious, or when you are sleeping. Panic attacks may occur for a number of reasons:   Healthy people occasionally have panic attacks in extreme, life-threatening situations, such as war or natural disasters. Normal anxiety is a protective mechanism of the body that helps [place] react to danger (fight or flight response).  Panic attacks are often seen with anxiety disorders, such as panic disorder, social anxiety disorder, generalized anxiety disorder, and phobias. Anxiety disorders cause excessive or uncontrollable anxiety. They may interfere with your relationships or other life activities.  Panic attacks are sometimes seen with other mental illnesses, such as depression and posttraumatic stress disorder.  Certain medical conditions, prescription medicines, and drugs of abuse can cause panic attacks. SYMPTOMS  Panic attacks start suddenly, peak within 20 minutes, and are accompanied by four or more of the following symptoms:  Pounding heart or fast heart rate (palpitations).  Sweating.  Trembling or shaking.  Shortness of breath or feeling smothered.  Feeling choked.  Chest pain or discomfort.  Nausea or strange feeling in your stomach.  Dizziness, light-headedness, or feeling like you will faint.  Chills or hot flushes.  Numbness or tingling in your lips or hands and feet.  Feeling that things are not real or feeling that  you are not yourself.  Fear of losing control or going crazy.  Fear of dying. Some of these symptoms can mimic serious medical conditions. For example, you may think you are having a heart attack. Although panic attacks can be very scary, they are not life threatening. DIAGNOSIS  Panic attacks are diagnosed through an assessment by your health care provider. Your health care provider will ask questions about your symptoms, such as where and when they occurred. Your health care provider will also ask about your medical history and use of alcohol and drugs, including prescription medicines. Your health care provider may order blood tests or other studies to rule out a serious medical condition. Your health care provider may refer you to a mental health professional for further evaluation. TREATMENT   Most healthy people who have one or two panic attacks in an extreme, life-threatening situation will not require treatment.  The treatment for panic attacks associated with anxiety disorders or other mental illness typically involves counseling with a mental health professional, medicine, or a combination of both. Your health care provider will help determine what treatment is best for you.  Panic attacks due to physical illness usually go away with treatment of the illness. If prescription medicine is causing panic attacks, talk with your health care provider about stopping the medicine, decreasing the dose, or substituting another medicine.  Panic attacks due to alcohol or drug abuse go away with abstinence. Some adults need professional help in order to stop drinking or using drugs. HOME CARE INSTRUCTIONS   Take all medicines as directed by your health care provider.   Schedule and attend follow-up visits as directed by your health  care provider. It is important to keep all your appointments. SEEK MEDICAL CARE IF:  You are not able to take your medicines as prescribed.  Your symptoms do not  improve or get worse. SEEK IMMEDIATE MEDICAL CARE IF:   You experience panic attack symptoms that are different than your usual symptoms.  You have serious thoughts about hurting yourself or others.  You are taking medicine for panic attacks and have a serious side effect. MAKE SURE YOU:  Understand these instructions.  Will watch your condition.  Will get help right away if you are not doing well or get worse.   This information is not intended to replace advice given to you by your health care provider. Make sure you discuss any questions you have with your health care provider.   Document Released: 12/04/2005 Document Revised: 12/09/2013 Document Reviewed: 07/18/2013 Elsevier Interactive Patient Education 2016 Elsevier Inc.  Major Depressive Disorder Major depressive disorder is a mental illness. It also may be called clinical depression or unipolar depression. Major depressive disorder usually causes feelings of sadness, hopelessness, or helplessness. Some people with this disorder do not feel particularly sad but lose interest in doing things they used to enjoy (anhedonia). Major depressive disorder also can cause physical symptoms. It can interfere with work, school, relationships, and other normal everyday activities. The disorder varies in severity but is longer lasting and more serious than the sadness we all feel from time to time in our lives. Major depressive disorder often is triggered by stressful life events or major life changes. Examples of these triggers include divorce, loss of your job or home, a move, and the death of a family member or close friend. Sometimes this disorder occurs for no obvious reason at all. People who have family members with major depressive disorder or bipolar disorder are at higher risk for developing this disorder, with or without life stressors. Major depressive disorder can occur at any age. It may occur just once in your life (single episode  major depressive disorder). It may occur multiple times (recurrent major depressive disorder). SYMPTOMS People with major depressive disorder have either anhedonia or depressed mood on nearly a daily basis for at least 2 weeks or longer. Symptoms of depressed mood include:  Feelings of sadness (blue or down in the dumps) or emptiness.  Feelings of hopelessness or helplessness.  Tearfulness or episodes of crying (may be observed by others).  Irritability (children and adolescents). In addition to depressed mood or anhedonia or both, people with this disorder have at least four of the following symptoms:  Difficulty sleeping or sleeping too much.   Significant change (increase or decrease) in appetite or weight.   Lack of energy or motivation.  Feelings of guilt and worthlessness.   Difficulty concentrating, remembering, or making decisions.  Unusually slow movement (psychomotor retardation) or restlessness (as observed by others).   Recurrent wishes for death, recurrent thoughts of self-harm (suicide), or a suicide attempt. People with major depressive disorder commonly have persistent negative thoughts about themselves, other people, and the world. People with severe major depressive disorder may experiencedistorted beliefs or perceptions about the world (psychotic delusions). They also may see or hear things that are not real (psychotic hallucinations). DIAGNOSIS Major depressive disorder is diagnosed through an assessment by your health care provider. Your health care provider will ask aboutaspects of your daily life, such as mood,sleep, and appetite, to see if you have the diagnostic symptoms of major depressive disorder. Your health care provider may ask  about your medical history and use of alcohol or drugs, including prescription medicines. Your health care provider also may do a physical exam and blood work. This is because certain medical conditions and the use of certain  substances can cause major depressive disorder-like symptoms (secondary depression). Your health care provider also may refer you to a mental health specialist for further evaluation and treatment. TREATMENT It is important to recognize the symptoms of major depressive disorder and seek treatment. The following treatments can be prescribed for this disorder:   Medicine. Antidepressant medicines usually are prescribed. Antidepressant medicines are thought to correct chemical imbalances in the brain that are commonly associated with major depressive disorder. Other types of medicine may be added if the symptoms do not respond to antidepressant medicines alone or if psychotic delusions or hallucinations occur.  Talk therapy. Talk therapy can be helpful in treating major depressive disorder by providing support, education, and guidance. Certain types of talk therapy also can help with negative thinking (cognitive behavioral therapy) and with relationship issues that trigger this disorder (interpersonal therapy). A mental health specialist can help determine which treatment is best for you. Most people with major depressive disorder do well with a combination of medicine and talk therapy. Treatments involving electrical stimulation of the brain can be used in situations with extremely severe symptoms or when medicine and talk therapy do not work over time. These treatments include electroconvulsive therapy, transcranial magnetic stimulation, and vagal nerve stimulation.   This information is not intended to replace advice given to you by your health care provider. Make sure you discuss any questions you have with your health care provider.   Document Released: 03/31/2013 Document Revised: 12/25/2014 Document Reviewed: 03/31/2013 Elsevier Interactive Patient Education 2016 [employer].  Fatigue Fatigue is feeling tired all of the time, a lack of energy, or a lack of motivation. Occasional or mild fatigue  is often a normal response to activity or life in general. However, long-lasting (chronic) or extreme fatigue may indicate an underlying medical condition. HOME CARE INSTRUCTIONS  Watch your fatigue for any changes. The following actions may help to lessen any discomfort you are feeling:  Talk to your health care provider about how much sleep you need each night. Try to get the required amount every night.  Take medicines only as directed by your health care provider.  Eat a healthy and nutritious diet. Ask your health care provider if you need help changing your diet.  Drink enough fluid to keep your urine clear or pale yellow.  Practice ways of relaxing, such as yoga, meditation, massage therapy, or acupuncture.  Exercise regularly.   Change situations that cause you stress. Try to keep your work and personal routine reasonable.  Do not abuse illegal drugs.  Limit alcohol intake to no more than 1 drink per day for nonpregnant women and 2 drinks per day for men. One drink equals 12 ounces of beer, 5 ounces of wine, or 1 ounces of hard liquor.  Take a multivitamin, if directed by your health care provider. SEEK MEDICAL CARE IF:   Your fatigue does not get better.  You have a fever.   You have unintentional weight loss or gain.  You have headaches.   You have difficulty:   Falling asleep.  Sleeping throughout the night.  You feel angry, guilty, anxious, or sad.   You are unable to have a bowel movement (constipation).   You skin is dry.   Your legs or another part of  your body is swollen.  SEEK IMMEDIATE MEDICAL CARE IF:   You feel confused.   Your vision is blurry.  You feel faint or pass out.   You have a severe headache.   You have severe abdominal, pelvic, or back pain.   You have chest pain, shortness of breath, or an irregular or fast heartbeat.   You are unable to urinate or you urinate less than normal.   You develop abnormal  bleeding, such as bleeding from the rectum, vagina, nose, lungs, or nipples.  You vomit blood.   You have thoughts about harming yourself or committing suicide.   You are worried that you might harm someone else.    This information is not intended to replace advice given to you by your health care provider. Make sure you discuss any questions you have with your health care provider.   Document Released: 10/01/2007 Document Revised: 12/25/2014 Document Reviewed: 04/07/2014 Elsevier Interactive Patient Education [employer].

## 2016-04-19 DIAGNOSIS — F331 Major depressive disorder, recurrent, moderate: Secondary | ICD-10-CM | POA: Diagnosis not present

## 2016-04-24 DIAGNOSIS — Z79899 Other long term (current) drug therapy: Secondary | ICD-10-CM | POA: Diagnosis not present

## 2016-04-24 DIAGNOSIS — M316 Other giant cell arteritis: Secondary | ICD-10-CM | POA: Diagnosis not present

## 2016-04-24 DIAGNOSIS — E119 Type 2 diabetes mellitus without complications: Secondary | ICD-10-CM | POA: Diagnosis not present

## 2016-04-26 DIAGNOSIS — F331 Major depressive disorder, recurrent, moderate: Secondary | ICD-10-CM | POA: Diagnosis not present

## 2016-04-28 DIAGNOSIS — F418 Other specified anxiety disorders: Secondary | ICD-10-CM | POA: Diagnosis not present

## 2016-04-28 DIAGNOSIS — M316 Other giant cell arteritis: Secondary | ICD-10-CM | POA: Diagnosis not present

## 2016-04-28 DIAGNOSIS — Z79899 Other long term (current) drug therapy: Secondary | ICD-10-CM | POA: Diagnosis not present

## 2016-04-28 DIAGNOSIS — E119 Type 2 diabetes mellitus without complications: Secondary | ICD-10-CM | POA: Diagnosis not present

## 2016-05-02 DIAGNOSIS — C61 Malignant neoplasm of prostate: Secondary | ICD-10-CM | POA: Diagnosis not present

## 2016-05-02 DIAGNOSIS — R351 Nocturia: Secondary | ICD-10-CM | POA: Diagnosis not present

## 2016-05-02 DIAGNOSIS — D400 Neoplasm of uncertain behavior of prostate: Secondary | ICD-10-CM | POA: Diagnosis not present

## 2016-05-02 DIAGNOSIS — R3916 Straining to void: Secondary | ICD-10-CM | POA: Diagnosis not present

## 2016-05-03 DIAGNOSIS — F331 Major depressive disorder, recurrent, moderate: Secondary | ICD-10-CM | POA: Diagnosis not present

## 2016-05-12 DIAGNOSIS — F331 Major depressive disorder, recurrent, moderate: Secondary | ICD-10-CM | POA: Diagnosis not present

## 2016-05-25 DIAGNOSIS — M316 Other giant cell arteritis: Secondary | ICD-10-CM | POA: Diagnosis not present

## 2016-05-25 DIAGNOSIS — Z79899 Other long term (current) drug therapy: Secondary | ICD-10-CM | POA: Diagnosis not present

## 2016-05-30 DIAGNOSIS — M316 Other giant cell arteritis: Secondary | ICD-10-CM | POA: Diagnosis not present

## 2016-05-30 DIAGNOSIS — F418 Other specified anxiety disorders: Secondary | ICD-10-CM | POA: Diagnosis not present

## 2016-05-30 DIAGNOSIS — E119 Type 2 diabetes mellitus without complications: Secondary | ICD-10-CM | POA: Diagnosis not present

## 2016-06-05 ENCOUNTER — Telehealth: Payer: Self-pay | Admitting: Family Medicine

## 2016-06-05 DIAGNOSIS — R918 Other nonspecific abnormal finding of lung field: Secondary | ICD-10-CM

## 2016-06-05 NOTE — Telephone Encounter (Signed)
CT chest without contrast per CT department

## 2016-06-05 NOTE — Telephone Encounter (Signed)
The patient wants to schedule a repeat CT of his lung.  States he thought it was supposed to be a repeat  CT of his lungs done in June.  When scheduled he would like early mornings and at the imaging center across the road not at the hospital.

## 2016-06-05 NOTE — Telephone Encounter (Signed)
Can you please order chest CT to follow up on lung nodules seen on CT in march. Rule out mass, Rule out aneurysm. I don't know which order radiology wants. Thanks.

## 2016-06-05 NOTE — Telephone Encounter (Signed)
Please advise referral? Patient was advised in march to repeat CT.

## 2016-06-06 NOTE — Telephone Encounter (Signed)
Order in Epic!

## 2016-06-12 DIAGNOSIS — I209 Angina pectoris, unspecified: Secondary | ICD-10-CM | POA: Diagnosis not present

## 2016-06-12 DIAGNOSIS — R0602 Shortness of breath: Secondary | ICD-10-CM | POA: Diagnosis not present

## 2016-06-12 DIAGNOSIS — R601 Generalized edema: Secondary | ICD-10-CM | POA: Diagnosis not present

## 2016-06-12 DIAGNOSIS — E669 Obesity, unspecified: Secondary | ICD-10-CM | POA: Diagnosis not present

## 2016-06-13 ENCOUNTER — Other Ambulatory Visit: Payer: Self-pay | Admitting: Internal Medicine

## 2016-06-13 ENCOUNTER — Ambulatory Visit
Admission: RE | Admit: 2016-06-13 | Discharge: 2016-06-13 | Disposition: A | Discharge status: Home / Self Care | Payer: 59 | Source: Ambulatory Visit | Attending: Family Medicine | Admitting: Family Medicine

## 2016-06-13 DIAGNOSIS — R601 Generalized edema: Secondary | ICD-10-CM

## 2016-06-13 DIAGNOSIS — R918 Other nonspecific abnormal finding of lung field: Secondary | ICD-10-CM | POA: Diagnosis not present

## 2016-06-13 DIAGNOSIS — R0602 Shortness of breath: Secondary | ICD-10-CM

## 2016-06-14 DIAGNOSIS — F331 Major depressive disorder, recurrent, moderate: Secondary | ICD-10-CM | POA: Diagnosis not present

## 2016-06-16 ENCOUNTER — Ambulatory Visit
Admission: RE | Admit: 2016-06-16 | Discharge: 2016-06-16 | Disposition: A | Discharge status: Home / Self Care | Payer: 59 | Source: Ambulatory Visit | Attending: Internal Medicine | Admitting: Internal Medicine

## 2016-06-16 DIAGNOSIS — I440 Atrioventricular block, first degree: Secondary | ICD-10-CM | POA: Diagnosis not present

## 2016-06-16 DIAGNOSIS — R0602 Shortness of breath: Secondary | ICD-10-CM | POA: Diagnosis not present

## 2016-06-16 DIAGNOSIS — I10 Essential (primary) hypertension: Secondary | ICD-10-CM | POA: Insufficient documentation

## 2016-06-16 DIAGNOSIS — R601 Generalized edema: Secondary | ICD-10-CM

## 2016-06-16 DIAGNOSIS — E119 Type 2 diabetes mellitus without complications: Secondary | ICD-10-CM | POA: Insufficient documentation

## 2016-06-16 LAB — ECHOCARDIOGRAM COMPLETE
AOPV: 0.89 m/s
AV Area VTI: 2.8 cm2
AVPG: 7 mmHg
AVPKVEL: 128 cm/s
CHL CUP AV PEAK INDEX: 1.16
E decel time: 187 msec
EERAT: 8.11
FS: 37 % (ref 28–44)
IVS/LV PW RATIO, ED: 0.92
LA diam index: 1.49 cm/m2
LA vol A4C: 58.4 ml
LA vol: 72.1 mL
LASIZE: 36 mm
LAVOLIN: 29.9 mL/m2
LDCA: 3.14 cm2
LEFT ATRIUM END SYS DIAM: 36 mm
LV PW d: 13.4 mm — AB (ref 0.6–1.1)
LV TDI E'LATERAL: 9.57
LV TDI E'MEDIAL: 7.62
LV e' LATERAL: 9.57 cm/s
LVEEAVG: 8.11
LVEEMED: 8.11
LVOT peak grad rest: 5 mmHg
LVOT peak vel: 114 cm/s
LVOTD: 20 mm
MV Dec: 187
MV Peak grad: 2 mmHg
MV pk A vel: 101 m/s
MV pk E vel: 77.6 m/s
TAPSE: 17.7 mm

## 2016-06-16 LAB — NM MYOCAR MULTI W/SPECT W/WALL MOTION / EF
CHL CUP MPHR: 155 {beats}/min
CSEPEW: 1 METS
CSEPHR: 65 %
CSEPPHR: 102 {beats}/min
Exercise duration (min): 1 min
Exercise duration (sec): 0 s
LV dias vol: 75 mL (ref 62–150)
LVSYSVOL: 20 mL
Rest HR: 69 {beats}/min
SDS: 0
SRS: 1
SSS: 0
TID: 1.12

## 2016-06-16 MED ORDER — TECHNETIUM TC 99M TETROFOSMIN IV KIT
33.0000 | PACK | Freq: Once | INTRAVENOUS | Status: AC | PRN
  Administered 2016-06-16: 32.814 via INTRAVENOUS

## 2016-06-16 MED ORDER — REGADENOSON 0.4 MG/5ML IV SOLN
0.4000 mg | Freq: Once | INTRAVENOUS | Status: AC
  Administered 2016-06-16: 0.4 mg via INTRAVENOUS
  Filled 2016-06-16: qty 5

## 2016-06-16 MED ORDER — TECHNETIUM TC 99M TETROFOSMIN IV KIT
13.5010 | PACK | Freq: Once | INTRAVENOUS | Status: AC | PRN
  Administered 2016-06-16: 13.501 via INTRAVENOUS

## 2016-06-16 NOTE — Progress Notes (Signed)
*  PRELIMINARY RESULTS* Echocardiogram 2D Echocardiogram has been performed.  [name] 06/16/2016, 10:03 AM

## 2016-06-29 DIAGNOSIS — M316 Other giant cell arteritis: Secondary | ICD-10-CM | POA: Diagnosis not present

## 2016-06-29 DIAGNOSIS — H3412 Central retinal artery occlusion, left eye: Secondary | ICD-10-CM | POA: Diagnosis not present

## 2016-07-12 DIAGNOSIS — R601 Generalized edema: Secondary | ICD-10-CM | POA: Diagnosis not present

## 2016-07-12 DIAGNOSIS — I890 Lymphedema, not elsewhere classified: Secondary | ICD-10-CM | POA: Diagnosis not present

## 2016-07-12 DIAGNOSIS — I209 Angina pectoris, unspecified: Secondary | ICD-10-CM | POA: Diagnosis not present

## 2016-07-12 DIAGNOSIS — R0602 Shortness of breath: Secondary | ICD-10-CM | POA: Diagnosis not present

## 2016-08-08 ENCOUNTER — Other Ambulatory Visit: Payer: Self-pay | Admitting: Family Medicine

## 2016-08-09 DIAGNOSIS — M316 Other giant cell arteritis: Secondary | ICD-10-CM | POA: Diagnosis not present

## 2016-08-11 DIAGNOSIS — M316 Other giant cell arteritis: Secondary | ICD-10-CM | POA: Diagnosis not present

## 2016-08-11 DIAGNOSIS — Z79899 Other long term (current) drug therapy: Secondary | ICD-10-CM | POA: Diagnosis not present

## 2016-08-14 DIAGNOSIS — F331 Major depressive disorder, recurrent, moderate: Secondary | ICD-10-CM | POA: Diagnosis not present

## 2016-09-03 IMAGING — MR MR HEAD W/O CM
10 series · 48 of 48 positions shown · non-contrast
Comparison: CT head without contrast from the same day. CTA of the
neck 12/29/2014

CLINICAL DATA: Gait disturbance and fall yesterday. Diagnosis of
prostate cancer 4 months ago.

EXAM:
MRI HEAD WITHOUT CONTRAST
TECHNIQUE: Multiplanar, multiecho pulse sequences of the brain and surrounding
structures were obtained without intravenous contrast.

[Series 2: T1 · sagittal · 5.0mm · 0.45mm/px · 3 of 25 slices shown (1 of 2)]
[im 1/25]
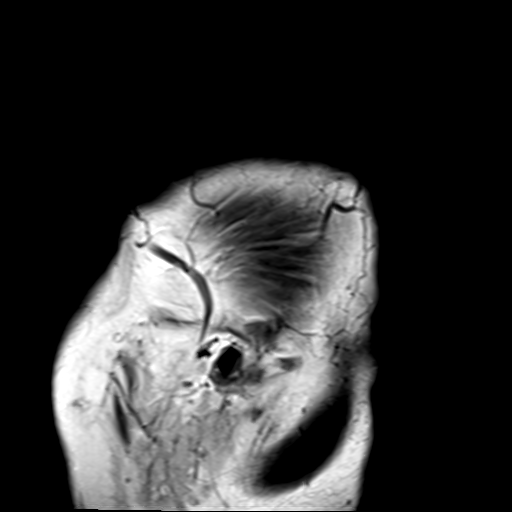
[im 13/25]
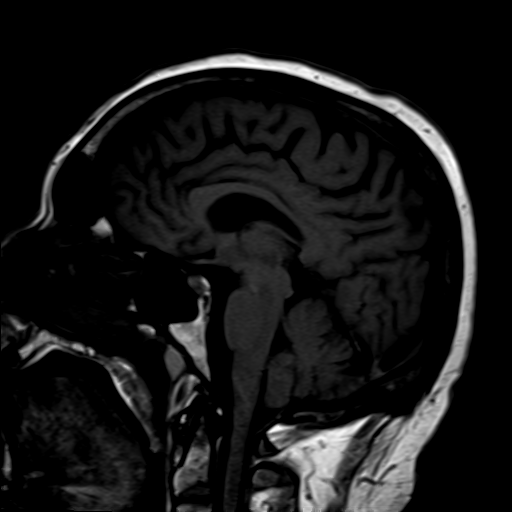
[im 25/25]
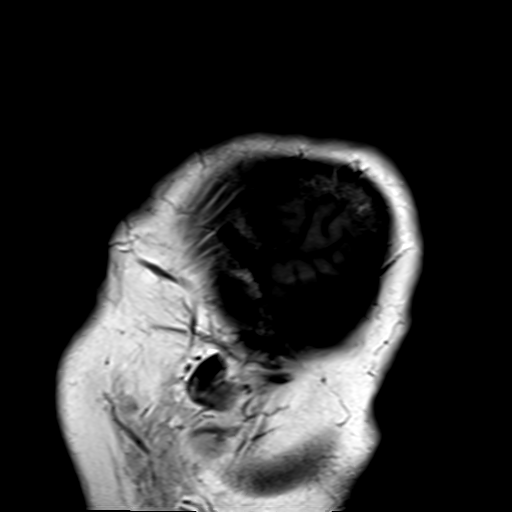

[Series 7: T2 · axial · 5.0mm · 0.60mm/px · z∈[-36,+110]mm · 3 of 25 slices shown (1 of 3)]
[im 1/25]
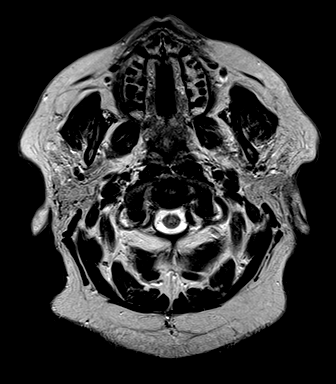
[im 13/25]
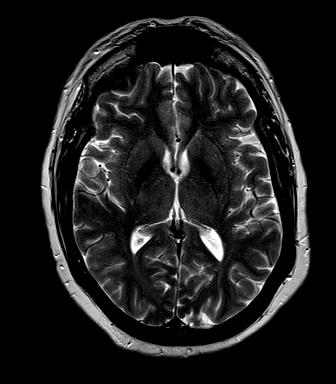
[im 25/25]
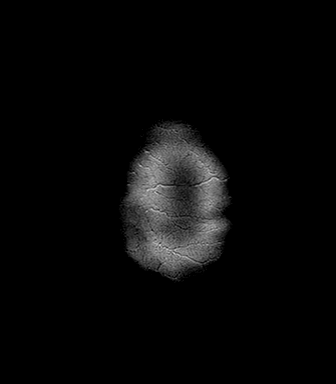

[Series 8: FLAIR · axial · 5.0mm · 0.45mm/px · z∈[-36,+110]mm · 3 of 25 slices shown]
[im 1/25]
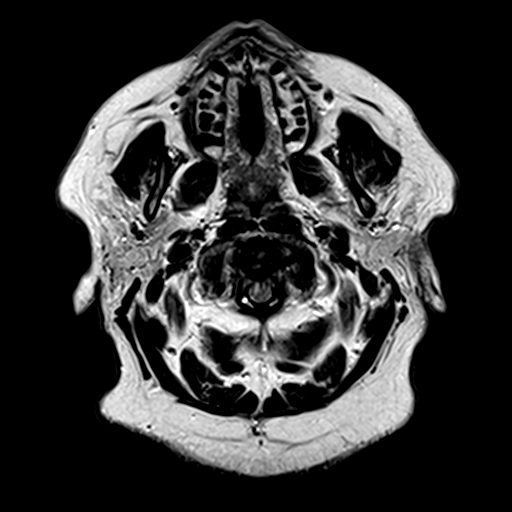
[im 13/25]
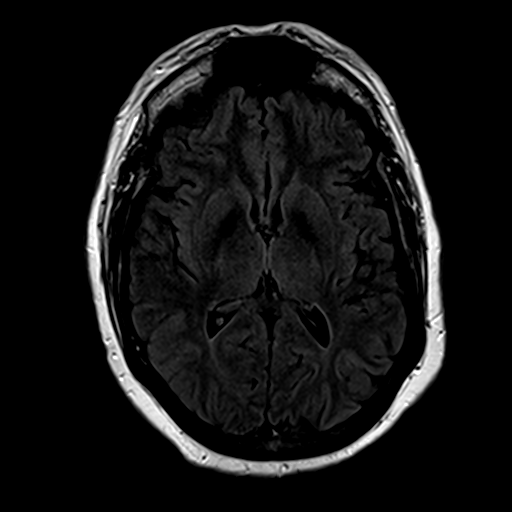
[im 25/25]
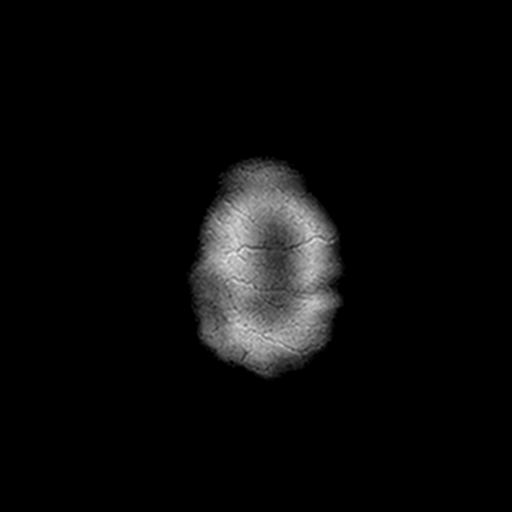

[Series 9: T2 · axial · 5.0mm · 0.45mm/px · z∈[-36,+110]mm · 3 of 25 slices shown (2 of 3)]
[im 1/25]
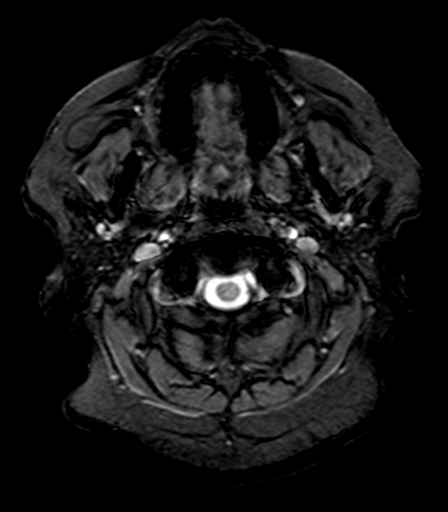
[im 13/25]
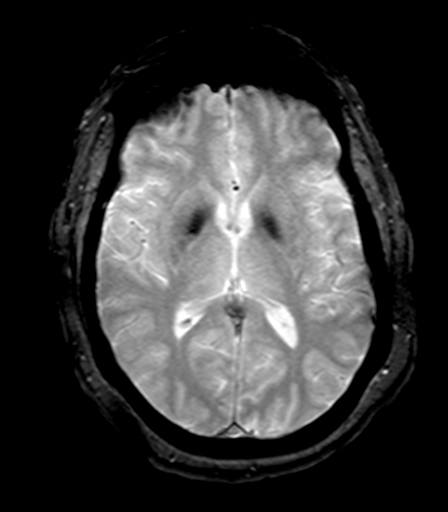
[im 25/25]
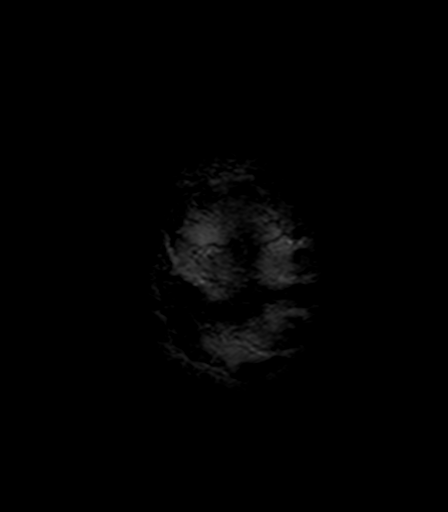

[Series 10: T1 · axial · 3.0mm · 1.00mm/px · z∈[-30,+113]mm · 6 of 52 slices shown (2 of 2)]
[im 1/52]
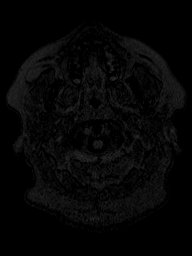
[im 11/52]
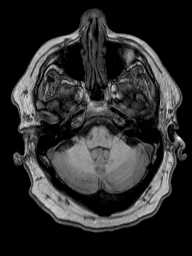
[im 21/52]
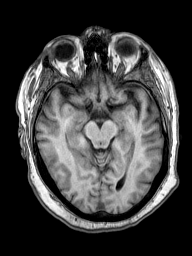
[im 31/52]
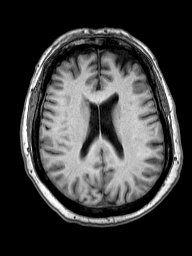
[im 41/52]
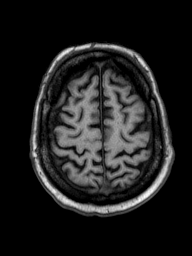
[im 52/52]
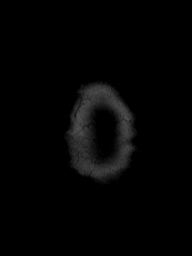

[Series 11: T2 · coronal · 5.0mm · 0.49mm/px · 4 of 29 slices shown (3 of 3)]
[im 1/29]
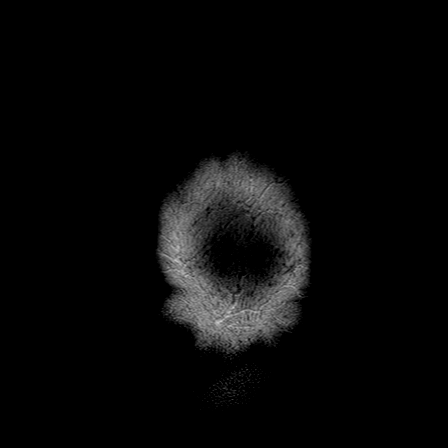
[im 10/29]
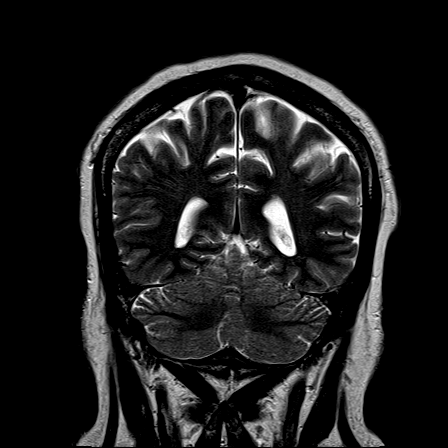
[im 19/29]
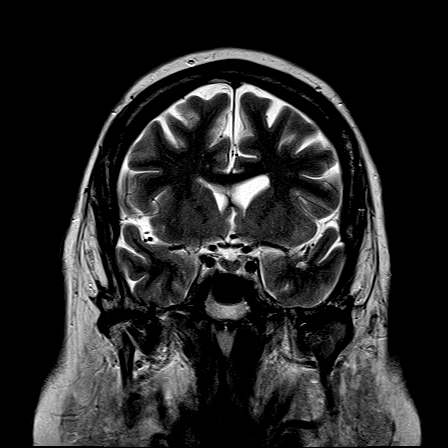
[im 29/29]
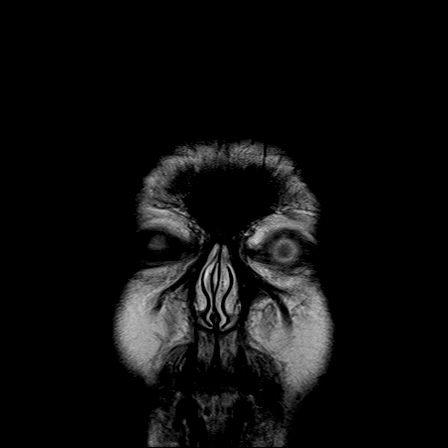

[Series 100: DWI · axial · 3.0mm · 1.80mm/px · z∈[-39,+113]mm · 7 of 55 slices shown (1 of 2)]
[im 1/55]
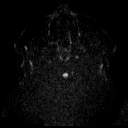
[im 10/55]
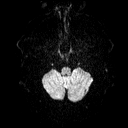
[im 19/55]
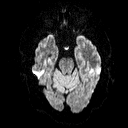
[im 28/55]
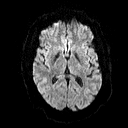
[im 37/55]
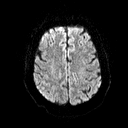
[im 46/55]
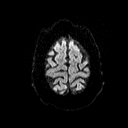
[im 55/55]
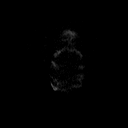

[Series 101: ADC · axial · 3.0mm · 1.80mm/px · z∈[-39,+113]mm · 7 of 55 slices shown (1 of 2)]
[im 1/55]
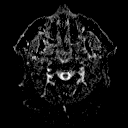
[im 10/55]
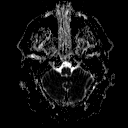
[im 19/55]
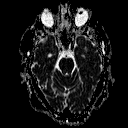
[im 28/55]
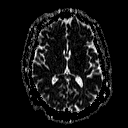
[im 37/55]
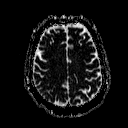
[im 46/55]
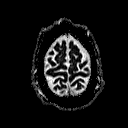
[im 55/55]
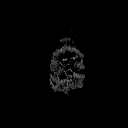

[Series 102: DWI · coronal · 3.0mm · 1.80mm/px · 6 of 47 slices shown (2 of 2)]
[im 1/47]
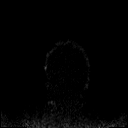
[im 10/47]
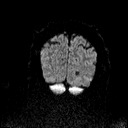
[im 19/47]
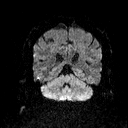
[im 28/47]
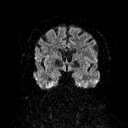
[im 37/47]
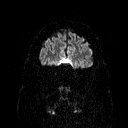
[im 47/47]
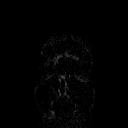

[Series 103: ADC · coronal · 3.0mm · 1.80mm/px · 6 of 47 slices shown (2 of 2)]
[im 1/47]
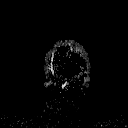
[im 10/47]
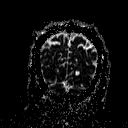
[im 19/47]
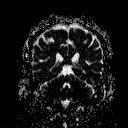
[im 28/47]
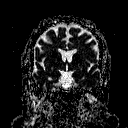
[im 37/47]
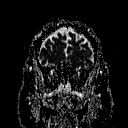
[im 47/47]
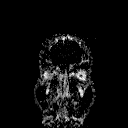

[48 of 48 positions shown; findings below may reference images not displayed]

FINDINGS: No acute infarct, hemorrhage, or mass lesion is present. The
ventricles are of normal size. No significant extraaxial fluid
collection is present.

No significant white matter disease is present. The basal ganglia
and brainstem are intact. The internal auditory canals are within
normal limits.

Flow is present in the major intracranial arteries. Globes and
orbits are intact.

Mild mucosal thickening is present in the ethmoid air cells
bilaterally. Minimal fluid is present in the mastoid air cells.

The skullbase is within normal limits. Midline structures are
unremarkable.
IMPRESSION: Negative MRI of the brain.

## 2016-09-25 DIAGNOSIS — F331 Major depressive disorder, recurrent, moderate: Secondary | ICD-10-CM | POA: Diagnosis not present

## 2016-09-27 DIAGNOSIS — R5383 Other fatigue: Secondary | ICD-10-CM | POA: Diagnosis not present

## 2016-09-27 DIAGNOSIS — Z79899 Other long term (current) drug therapy: Secondary | ICD-10-CM | POA: Diagnosis not present

## 2016-10-06 ENCOUNTER — Telehealth: Payer: Self-pay | Admitting: Family Medicine

## 2016-10-06 NOTE — Telephone Encounter (Signed)
Pt called to get an appt with Dr. Caryn Section to discuss ordering the CT Scan that was discussed to order at the first of next year, would like to get his A1C checked and possibly an EKG if needed. The next available open slot for F/U in 10/18/16. Pt stated his insurance changes 10/18/16 and that is why he is trying to get this done before it changes. Pt request to see if he can be worked between now and 10/17/16. Please advise. Thanks TNP

## 2016-10-07 NOTE — Telephone Encounter (Signed)
Please advise 

## 2016-10-08 NOTE — Telephone Encounter (Signed)
Can double book the 11am on Friday 10-27

## 2016-10-09 NOTE — Telephone Encounter (Signed)
Spoke with pt and scheduled appt on 10/13/16 @ 11 am. Thanks TNP

## 2016-10-11 DIAGNOSIS — M316 Other giant cell arteritis: Secondary | ICD-10-CM | POA: Diagnosis not present

## 2016-10-13 ENCOUNTER — Encounter: Payer: Self-pay | Admitting: Family Medicine

## 2016-10-13 ENCOUNTER — Ambulatory Visit: Payer: 59

## 2016-10-13 ENCOUNTER — Ambulatory Visit (INDEPENDENT_AMBULATORY_CARE_PROVIDER_SITE_OTHER): Payer: 59 | Admitting: Family Medicine

## 2016-10-13 VITALS — BP 126/84 | HR 73 | Temp 97.9°F | Resp 16 | Wt 303.0 lb

## 2016-10-13 DIAGNOSIS — I10 Essential (primary) hypertension: Secondary | ICD-10-CM

## 2016-10-13 DIAGNOSIS — E1149 Type 2 diabetes mellitus with other diabetic neurological complication: Secondary | ICD-10-CM

## 2016-10-13 DIAGNOSIS — R918 Other nonspecific abnormal finding of lung field: Secondary | ICD-10-CM | POA: Diagnosis not present

## 2016-10-13 LAB — POCT GLYCOSYLATED HEMOGLOBIN (HGB A1C)
ESTIMATED AVERAGE GLUCOSE: 131
Hemoglobin A1C: 6.2

## 2016-10-13 LAB — POCT UA - MICROALBUMIN: Microalbumin Ur, POC: 20 mg/L

## 2016-10-13 MED ORDER — GLIPIZIDE ER 2.5 MG PO TB24: 2.5000 mg | ORAL_TABLET | Freq: Every day | ORAL | 4 refills | Status: DC

## 2016-10-13 NOTE — Progress Notes (Signed)
Patient: Edward King Male    DOB: 1951/06/29   65 y.o.   MRN: [id number] Visit Date: 10/13/2016  Today's Provider: Lelon Huh, MD   Chief Complaint  Patient presents with  . Diabetes    follow up  . Lung Lesion    follow up   Subjective:    HPI  Diabetes Mellitus Type II, Follow-up:   Lab Results  Component Value Date   HGBA1C 6.0 02/02/2016   HGBA1C 5.8 08/29/2015   HGBA1C 6.2 (A) 10/09/2014    Last seen for diabetes 8 months ago.  Management since then includes no changes. He reports good compliance with treatment. He is not having side effects.  Current symptoms include none and have been stable. Home blood sugar records: fasting range: 100-110  Episodes of hypoglycemia? no   Current Insulin Regimen: none Most Recent Eye Exam: 09/2016 Weight trend: increasing steadily Prior visit with dietician: no Current diet: in general, an "unhealthy" diet Current exercise: walking  Pertinent Labs:    Component Value Date/Time   CHOL 200 10/09/2014   CHOL 174 08/30/2014 0450   TRIG 101 10/09/2014   TRIG 73 08/30/2014 0450   HDL 62 10/09/2014   HDL 40 08/30/2014 0450   LDLCALC 118 10/09/2014   LDLCALC 119 (H) 08/30/2014 0450   CREATININE 1.10 04/17/2016 1151   CREATININE 1.10 08/30/2014 0450    Wt Readings from Last 3 Encounters:  04/17/16 275 lb (124.7 kg)  03/08/16 287 lb (130.2 kg)  02/29/16 289 lb (131.1 kg)    ------------------------------------------------------------------------ Follow up Lung Nodules: Patient's last CT was done 06/13/2016 showing lung nodules were unchanged. Guidelines were to repeat in 6 months. If stable at that time, will not need any additional follow up.    No Known Allergies   Current Outpatient Prescriptions:  .  buPROPion (WELLBUTRIN SR) 150 MG 12 hr tablet, Take 150 mg by mouth daily., Disp: , Rfl:  .  citalopram (CELEXA) 20 MG tablet, Take 30 mg by mouth daily. , Disp: , Rfl:  .  folic acid (FOLVITE) 1  MG tablet, Take by mouth., Disp: , Rfl:  .  gabapentin (NEURONTIN) 300 MG capsule, TAKE 2 CAPSULES BY MOUTH IN THE MORNING AND 3 CAPSULES AT BEDTIME, Disp: 150 capsule, Rfl: 5 .  glipiZIDE (GLUCOTROL XL) 2.5 MG 24 hr tablet, TAKE 1 TABLET BY MOUTH ONCE DAILY, Disp: 90 tablet, Rfl: 4 .  HYDROcodone-acetaminophen (NORCO) 5-325 MG tablet, Take 1 tablet by mouth every 4 (four) hours as needed for moderate pain., Disp: 40 tablet, Rfl: 0 .  metFORMIN (GLUCOPHAGE-XR) 500 MG 24 hr tablet, TAKE 2 TABLETS BY MOUTH TWO TIMES DAILY, Disp: 360 tablet, Rfl: 3 .  Multiple Vitamin (MULTIVITAMIN WITH MINERALS) TABS tablet, Take 1 tablet by mouth daily., Disp: , Rfl:  .  predniSONE (DELTASONE) 5 MG tablet, Take 0.5 tablets by mouth daily., Disp: , Rfl:  .  tamsulosin (FLOMAX) 0.4 MG CAPS capsule, Take 0.4 mg by mouth daily after supper. Reported on 03/08/2016, Disp: , Rfl:  .  valsartan-hydrochlorothiazide (DIOVAN-HCT) 80-12.5 MG tablet, TAKE 1 TABLET BY MOUTH ONCE DAILY, Disp: 90 tablet, Rfl: 4  Review of Systems  Constitutional: Positive for fatigue. Negative for appetite change, chills and fever.  Respiratory: Negative for chest tightness, shortness of breath and wheezing.   Cardiovascular: Negative for chest pain and palpitations.  Gastrointestinal: Negative for abdominal pain, nausea and vomiting.  Endocrine: Negative for cold intolerance, heat intolerance, polydipsia, polyphagia and  polyuria.  Psychiatric/Behavioral: Positive for dysphoric mood.    Social History  Substance Use Topics  . Smoking status: Never Smoker  . Smokeless tobacco: Never Used  . Alcohol use 0.0 oz/week     Comment: Occasional use   Objective:   BP 126/84 (BP Location: Right Arm, Patient Position: Sitting, Cuff Size: Large)   Pulse 73   Temp 97.9 F (36.6 C) (Oral)   Resp 16   Wt (!) 303 lb (137.4 kg)   SpO2 96% Comment: room air  BMI 42.26 kg/m   Physical Exam  General Appearance:    Alert, cooperative, no distress,  obese  Eyes:    PERRL, conjunctiva/corneas clear, EOM's intact       Lungs:     Clear to auscultation bilaterally, respirations unlabored  Heart:    Regular rate and rhythm  Neurologic:   Awake, alert, oriented x 3. No apparent focal neurological           defect.         Results for orders placed or performed in visit on 10/13/16  POCT HgB A1C  Result Value Ref Range   Hemoglobin A1C 6.2    Est. average glucose Bld gHb Est-mCnc 131   POCT UA - Microalbumin  Result Value Ref Range   Microalbumin Ur, POC 20 mg/L   Creatinine, POC n/a mg/dL   Albumin/Creatinine Ratio, Urine, POC n/a         Assessment & Plan:     1. Type 2 diabetes mellitus with other neurologic complication, without long-term current use of insulin (HCC) Well controlled.  Continue current medications.   - POCT HgB A1C - POCT UA - Microalbumin - EKG 12-Lead  2. Multiple lung nodules on CT Repeat chest CT.  - CT Chest Wo Contrast; Future  3. Hypertension, unspecified type Well controlled.  Continue current medications.   - EKG 12-Lead       Lelon Huh, MD  [facility]

## 2016-10-16 ENCOUNTER — Telehealth: Payer: Self-pay

## 2016-10-16 ENCOUNTER — Ambulatory Visit
Admission: RE | Admit: 2016-10-16 | Discharge: 2016-10-16 | Disposition: A | Discharge status: Home / Self Care | Payer: 59 | Source: Ambulatory Visit | Attending: Family Medicine | Admitting: Family Medicine

## 2016-10-16 DIAGNOSIS — I700 Atherosclerosis of aorta: Secondary | ICD-10-CM | POA: Diagnosis not present

## 2016-10-16 DIAGNOSIS — I2510 Atherosclerotic heart disease of native coronary artery without angina pectoris: Secondary | ICD-10-CM | POA: Diagnosis not present

## 2016-10-16 DIAGNOSIS — R918 Other nonspecific abnormal finding of lung field: Secondary | ICD-10-CM | POA: Diagnosis not present

## 2016-10-16 NOTE — Telephone Encounter (Signed)
Left message to call back  

## 2016-10-16 NOTE — Telephone Encounter (Signed)
Advised patient of results.  

## 2016-10-16 NOTE — Telephone Encounter (Signed)
-----   Message from Birdie Sons, MD sent at 10/16/2016  2:58 PM EDT ----- Nodule on chest CT are unchanged, appear benign. Repeat CT is recommended in March 2018

## 2016-10-30 DIAGNOSIS — F331 Major depressive disorder, recurrent, moderate: Secondary | ICD-10-CM | POA: Diagnosis not present

## 2016-11-06 DIAGNOSIS — R300 Dysuria: Secondary | ICD-10-CM | POA: Diagnosis not present

## 2016-11-06 DIAGNOSIS — N5201 Erectile dysfunction due to arterial insufficiency: Secondary | ICD-10-CM | POA: Diagnosis not present

## 2016-11-06 DIAGNOSIS — E291 Testicular hypofunction: Secondary | ICD-10-CM | POA: Diagnosis not present

## 2016-11-06 DIAGNOSIS — D400 Neoplasm of uncertain behavior of prostate: Secondary | ICD-10-CM | POA: Diagnosis not present

## 2016-11-06 DIAGNOSIS — C61 Malignant neoplasm of prostate: Secondary | ICD-10-CM | POA: Diagnosis not present

## 2016-11-06 DIAGNOSIS — R310 Gross hematuria: Secondary | ICD-10-CM | POA: Diagnosis not present

## 2016-11-27 ENCOUNTER — Encounter: Payer: Self-pay | Admitting: Dietician

## 2016-11-27 ENCOUNTER — Encounter: Payer: PPO | Attending: Urology | Admitting: Dietician

## 2016-11-27 VITALS — BP 122/80 | Ht 71.0 in | Wt 311.5 lb

## 2016-11-27 DIAGNOSIS — Z6841 Body Mass Index (BMI) 40.0 and over, adult: Secondary | ICD-10-CM | POA: Diagnosis not present

## 2016-11-27 DIAGNOSIS — E119 Type 2 diabetes mellitus without complications: Secondary | ICD-10-CM | POA: Insufficient documentation

## 2016-11-27 DIAGNOSIS — Z713 Dietary counseling and surveillance: Secondary | ICD-10-CM | POA: Diagnosis not present

## 2016-11-27 DIAGNOSIS — E1149 Type 2 diabetes mellitus with other diabetic neurological complication: Secondary | ICD-10-CM

## 2016-11-27 NOTE — Patient Instructions (Signed)
  Check blood sugars 2 x day before breakfast and 2 hrs after supper every day and record Exercise: walk  for    20-30  minutes   3-4  days a week Avoid sugar sweetened drinks (soda, tea, coffee, sports drinks, juices) Eat 3 meals day,   2  snacks a day in afternoon and at bedtime Space meals 4-6 hours apart Eat 3-4 carbohydrate servings/meal + protein Eat 1 carbohydrate serving/snack + protein Limit intake of fried foods and sweets Drink plenty of water Bring blood sugar records to the next appointment/class Get a Regulatory affairs officer fast acting glucose and a snack at all times Return for appointment/classes on:  11-30-16

## 2016-11-27 NOTE — Progress Notes (Signed)
Diabetes Self-Management Education  Visit Type: First/Initial  Appt. Start Time: 1030 Appt. End Time: 1130  11/27/2016  Mr. Christyan Reger, identified by name and date of birth, is a 65 y.o. male with a diagnosis of Diabetes: Type 2.   ASSESSMENT  Blood pressure 122/80, height 5' 11"  (1.803 m), weight (!) 311 lb 8 oz (141.3 kg). Body mass index is 43.45 kg/m.      Diabetes Self-Management Education - 11/27/16 1459      Visit Information   Visit Type First/Initial     Initial Visit   Diabetes Type Type 2     Health Coping   How would you rate your overall health? Good     Psychosocial Assessment   Patient Belief/Attitude about Diabetes Motivated to manage diabetes   Self-care barriers None   Patient Concerns Weight Control;Glycemic Control;Healthy Lifestyle  become more fit   Special Needs None   Preferred Learning Style Auditory;Visual;Hands on   Learning Readiness Ready     Pre-Education Assessment   Patient understands the diabetes disease and treatment process. Needs Review   Patient understands incorporating nutritional management into lifestyle. Needs Review   Patient undertands incorporating physical activity into lifestyle. Needs Review   Patient understands using medications safely. Needs Review   Patient understands monitoring blood glucose, interpreting and using results Needs Review   Patient understands prevention, detection, and treatment of acute complications. Needs Review   Patient understands prevention, detection, and treatment of chronic complications. Needs Review   Patient understands how to develop strategies to address psychosocial issues. Needs Review   Patient understands how to develop strategies to promote health/change behavior. Needs Review     Complications   Last HgB A1C per patient/outside source 6.2 %  10-13-16   How often do you check your blood sugar? 1-2 times/day   Fasting Blood glucose range (mg/dL) 70-129   Have you had a  dilated eye exam in the past 12 months? Yes  10-09-16   Have you had a dental exam in the past 12 months? Yes  11-23-16   Are you checking your feet? Yes-c/o constant tingling in feet -worse at night (takes Gabapentin with some relief)   How many days per week are you checking your feet? 7     Dietary Intake   Breakfast --  eats breakfast at 8a   Snack (morning) --  eats occasional snack at 10a=crackers and cheese   Lunch --  eats lunch at 12p-eats fried foods and snack foods 2-3x/wk   Snack (afternoon) --  eats occasional snack at 3p=crackers and cheese   Dinner --  eats supper at 7p   Snack (evening) --  eats occasional 9p snack=nabs   Beverage(s) --  drinks Crystal light, diet sodas and unsweet tea 4-5x/day; milk 1x/day     Exercise   Exercise Type Light (walking / raking leaves)   How many days per week to you exercise? 4   How many minutes per day do you exercise? 20   Total minutes per week of exercise 80     Patient Education   Previous Diabetes Education Yes (please comment)  x1 visit at [place] state  Factors that contribute to the development of diabetes;Explored patient's options for treatment of their diabetes   Nutrition management  Food label reading, portion sizes and measuring food.;Carbohydrate counting;Reviewed blood glucose goals for pre and post meals and how to evaluate the patients' food intake on their blood glucose level.  Physical activity and exercise  Role of exercise on diabetes management, blood pressure control and cardiac health.   Medications Reviewed patients medication for diabetes, action, purpose, timing of dose and side effects.   Monitoring Taught/discussed recording of test results and interpretation of SMBG.;Identified appropriate SMBG and/or A1C goals.;Yearly dilated eye exam;Purpose and frequency of SMBG.   Acute complications Taught treatment of hypoglycemia - the 15 rule.  gave pt 1 tube of glucose tablets for  PRN use   Chronic complications Relationship between chronic complications and blood glucose control;Retinopathy and reason for yearly dilated eye exams;Dental care;Identified and discussed with patient  current chronic complications   Psychosocial adjustment Role of stress on diabetes   Personal strategies to promote health Lifestyle issues that need to be addressed for better diabetes care;Helped patient develop diabetes management plan for (enter comment)     Outcomes   Expected Outcomes Demonstrated interest in learning. Expect positive outcomes      Individualized Plan for Diabetes Self-Management Training:   Learning Objective:  Patient will have a greater understanding of diabetes self-management. Patient education plan is to attend individual and/or group sessions per assessed needs and concerns.     Patient Instructions   Check blood sugars 2 x day before breakfast and 2 hrs after supper every day and record Exercise: walk  for    20-30  minutes   3-4  days a week Avoid sugar sweetened drinks (soda, tea, coffee, sports drinks, juices) Eat 3 meals day,   2  snacks a day in afternoon and at bedtime Space meals 4-6 hours apart Eat 3-4 carbohydrate servings/meal + protein Eat 1 carbohydrate serving/snack + protein Limit intake of fried foods and sweets Drink plenty of water Bring blood sugar records to the next appointment/class Get a Regulatory affairs officer fast acting glucose and a snack at all times Return for appointment/classes on:  11-30-16   Expected Outcomes:  Demonstrated interest in learning. Expect positive outcomes  Education material provided:General Meal Planning Guidelines, Low BG handout, 1 tube glucose tablets for PRN use  If problems or questions, patient to contact team via:  [phone]  Future DSME appointment:  11-30-16

## 2016-11-29 DIAGNOSIS — Z79899 Other long term (current) drug therapy: Secondary | ICD-10-CM | POA: Diagnosis not present

## 2016-11-29 DIAGNOSIS — R7989 Other specified abnormal findings of blood chemistry: Secondary | ICD-10-CM | POA: Diagnosis not present

## 2016-11-30 ENCOUNTER — Ambulatory Visit: Payer: PPO

## 2016-12-01 DIAGNOSIS — F331 Major depressive disorder, recurrent, moderate: Secondary | ICD-10-CM | POA: Diagnosis not present

## 2016-12-04 ENCOUNTER — Encounter: Payer: Self-pay | Admitting: Dietician

## 2016-12-04 NOTE — Progress Notes (Signed)
Pt missed class 1 on 11-30-16. Called pt-Rescheduled classes beginning 12-28-16.

## 2016-12-07 ENCOUNTER — Ambulatory Visit: Payer: PPO

## 2016-12-08 ENCOUNTER — Other Ambulatory Visit: Payer: Self-pay | Admitting: Family Medicine

## 2016-12-08 MED ORDER — METFORMIN HCL ER 500 MG PO TB24: 1000.0000 mg | ORAL_TABLET | Freq: Two times a day (BID) | ORAL | 3 refills | Status: DC

## 2016-12-08 NOTE — Telephone Encounter (Signed)
Pt requesting refill of metFORMIN (GLUCOPHAGE-XR) 500 MG 24 hr tablet sent to [facility].

## 2016-12-14 ENCOUNTER — Ambulatory Visit: Payer: PPO

## 2016-12-28 ENCOUNTER — Encounter: Payer: Self-pay | Admitting: Dietician

## 2016-12-28 ENCOUNTER — Encounter: Payer: PPO | Attending: Urology | Admitting: Dietician

## 2016-12-28 VITALS — Ht 71.0 in | Wt 308.6 lb

## 2016-12-28 DIAGNOSIS — Z6841 Body Mass Index (BMI) 40.0 and over, adult: Secondary | ICD-10-CM | POA: Insufficient documentation

## 2016-12-28 DIAGNOSIS — E119 Type 2 diabetes mellitus without complications: Secondary | ICD-10-CM | POA: Insufficient documentation

## 2016-12-28 DIAGNOSIS — Z713 Dietary counseling and surveillance: Secondary | ICD-10-CM | POA: Insufficient documentation

## 2016-12-28 DIAGNOSIS — E1149 Type 2 diabetes mellitus with other diabetic neurological complication: Secondary | ICD-10-CM

## 2016-12-28 NOTE — Progress Notes (Signed)

## 2017-01-09 DIAGNOSIS — H34232 Retinal artery branch occlusion, left eye: Secondary | ICD-10-CM | POA: Diagnosis not present

## 2017-01-11 ENCOUNTER — Ambulatory Visit: Payer: PPO

## 2017-01-17 DIAGNOSIS — M316 Other giant cell arteritis: Secondary | ICD-10-CM | POA: Diagnosis not present

## 2017-01-25 ENCOUNTER — Telehealth: Payer: Self-pay | Admitting: *Deleted

## 2017-01-25 NOTE — Telephone Encounter (Signed)
Pt didn't show for Class 2. Called him and he reported not feeling well today. Instructed him to attend Class 3 next week and Class 2 on March 8.

## 2017-02-01 ENCOUNTER — Encounter: Payer: PPO | Attending: Urology | Admitting: Dietician

## 2017-02-01 ENCOUNTER — Encounter: Payer: Self-pay | Admitting: Dietician

## 2017-02-01 VITALS — BP 136/82 | Ht 71.0 in | Wt 311.3 lb

## 2017-02-01 DIAGNOSIS — Z713 Dietary counseling and surveillance: Secondary | ICD-10-CM | POA: Diagnosis not present

## 2017-02-01 DIAGNOSIS — E119 Type 2 diabetes mellitus without complications: Secondary | ICD-10-CM | POA: Diagnosis not present

## 2017-02-01 DIAGNOSIS — Z6841 Body Mass Index (BMI) 40.0 and over, adult: Secondary | ICD-10-CM | POA: Insufficient documentation

## 2017-02-01 DIAGNOSIS — E1149 Type 2 diabetes mellitus with other diabetic neurological complication: Secondary | ICD-10-CM

## 2017-02-01 NOTE — Progress Notes (Signed)
Appt. Start Time: 9:00am  Appt. End Time: 12:00  Class 3 Psychosocial - identify DM as a source of stress; state the effects of stress on BG control; verbalize appropriate stress management techniques; identify personal stress issues   Nutritional Management - describe effects of food on blood glucose; identify sources of carbohydrate, protein and fat; verbalize the importance of balance meals in controlling blood glucose; identify meals as well balanced or not; estimate servings of carbohydrate from menus; use food labels to identify servings size, content of carbohydrate, fiber, protein, fat, saturated fat and sodium; recognize food sources of fat, saturated fat, trans fat, sodium and verbalize goals for intake; describe healthful appropriate food choices when dining out   Exercise - describe the effects of exercise on blood glucose and importance of regular exercise in controlling diabetes; state a plan for personal exercise; verbalize contraindications for exercise  Self-Monitoring - state importance of HBGM and demo procedure accurately; use HBGM results to effectively manage diabetes; identify importance of regular HbA1C testing and goals for results  Acute Complications/Sick Day Guidelines - recognize hyperglycemia and hypoglycemia with causes and effects; identify blood glucose results as high, low or in control; list steps in treating and preventing high and low blood glucose; state appropriate measure to manage blood glucose when ill (need for meds, HBGM plan, when to call physician, need for fluids)  Chronic Complications/Foot, Skin, Eye Dental Care - identify possible long-term complications of diabetes (retinopathy, neuropathy, nephropathy, cardiovascular disease, infections); explain steps in prevention and treatment of chronic complications; state importance of daily self-foot exams; describe how to examine feet and what to look for; explain appropriate eye and dental care  Lifestyle  Changes/Goals & Health/Community Resources - state benefits of making appropriate lifestyle changes; identify habits that need to change (meals, tobacco, alcohol); identify strategies to reduce risk factors for personal health; set goals for proper diabetes care; state need for and frequency of healthcare follow-up; describe appropriate community resources for good health (ADA, web sites, apps)   Teaching Materials Used: Class 3 Slide Packet Diabetes Stress Test Stress Management Tools Stress Poem Recipe/Menu Booklet Nutrition Prescription Fast Food Information Goal Setting Worksheet

## 2017-02-02 DIAGNOSIS — F331 Major depressive disorder, recurrent, moderate: Secondary | ICD-10-CM | POA: Diagnosis not present

## 2017-02-14 ENCOUNTER — Encounter: Payer: Self-pay | Admitting: Family Medicine

## 2017-02-14 ENCOUNTER — Ambulatory Visit (INDEPENDENT_AMBULATORY_CARE_PROVIDER_SITE_OTHER): Payer: PPO | Admitting: Family Medicine

## 2017-02-14 VITALS — BP 120/82 | HR 68 | Temp 98.0°F | Resp 16 | Wt 315.0 lb

## 2017-02-14 DIAGNOSIS — E1149 Type 2 diabetes mellitus with other diabetic neurological complication: Secondary | ICD-10-CM

## 2017-02-14 DIAGNOSIS — G4733 Obstructive sleep apnea (adult) (pediatric): Secondary | ICD-10-CM

## 2017-02-14 DIAGNOSIS — R918 Other nonspecific abnormal finding of lung field: Secondary | ICD-10-CM | POA: Diagnosis not present

## 2017-02-14 DIAGNOSIS — R5383 Other fatigue: Secondary | ICD-10-CM | POA: Diagnosis not present

## 2017-02-14 DIAGNOSIS — I10 Essential (primary) hypertension: Secondary | ICD-10-CM | POA: Diagnosis not present

## 2017-02-14 LAB — POCT GLYCOSYLATED HEMOGLOBIN (HGB A1C)
ESTIMATED AVERAGE GLUCOSE: 128
Hemoglobin A1C: 6.1

## 2017-02-14 NOTE — Patient Instructions (Signed)
Recommend taking 13m enteric coated aspirin every other day to reduce risk of vascular events such as heart attacks and strokes.

## 2017-02-14 NOTE — Progress Notes (Signed)
Patient: Edward King Male    DOB: 08-28-1951   66 y.o.   MRN: [id number] Visit Date: 02/14/2017  Today's Provider: Lelon Huh, MD   Chief Complaint  Patient presents with  . Diabetes    follow up  . Hypertension    follow up  . Other    Lung nodules   Subjective:    HPI  Diabetes Mellitus Type II, Follow-up:   Lab Results  Component Value Date   HGBA1C 6.2 10/13/2016   HGBA1C 6.0 02/02/2016   HGBA1C 5.8 08/29/2015    Last seen for diabetes 4 months ago.  Management since then includes no changes. He reports good compliance with treatment. He is not having side effects.  Current symptoms include paresthesia of the feet and visual disturbances and have been stable. Home blood sugar records: fasting range: 95-110  Episodes of hypoglycemia? no   Current Insulin Regimen: none Most Recent Eye Exam: <1 year ago; [facility] Weight trend: increasing steadily Prior visit with dietician: yes Current diet: in general, an "unhealthy" diet Current exercise: walking  Pertinent Labs:    Component Value Date/Time   CHOL 200 10/09/2014   CHOL 174 08/30/2014 0450   TRIG 101 10/09/2014   TRIG 73 08/30/2014 0450   HDL 62 10/09/2014   HDL 40 08/30/2014 0450   LDLCALC 118 10/09/2014   LDLCALC 119 (H) 08/30/2014 0450   CREATININE 1.10 04/17/2016 1151   CREATININE 1.10 08/30/2014 0450    Wt Readings from Last 3 Encounters:  02/01/17 (!) 311 lb 4.8 oz (141.2 kg)  12/28/16 (!) 308 lb 9.6 oz (140 kg)  11/27/16 (!) 311 lb 8 oz (141.3 kg)    ------------------------------------------------------------------------  Hypertension, follow-up:  BP Readings from Last 3 Encounters:  02/01/17 136/82  11/27/16 122/80  10/13/16 126/84    He was last seen for hypertension 4 months ago.  BP at that visit was 126/84. Management since that visit includes no changes. He reports good compliance with treatment. He is not having side effects.  He is  exercising. He is adherent to low salt diet.   Outside blood pressures are not being checked. He is experiencing fatigue.  Patient denies chest pain, chest pressure/discomfort, claudication, dyspnea, exertional chest pressure/discomfort, irregular heart beat, lower extremity edema, near-syncope, orthopnea, palpitations, paroxysmal nocturnal dyspnea, syncope and tachypnea.   Cardiovascular risk factors include advanced age (older than 72 for men, 68 for women), diabetes mellitus, hypertension and male gender.  Use of agents associated with hypertension: none.     Weight trend: increasing steadily Wt Readings from Last 3 Encounters:  02/01/17 (!) 311 lb 4.8 oz (141.2 kg)  12/28/16 (!) 308 lb 9.6 oz (140 kg)  11/27/16 (!) 311 lb 8 oz (141.3 kg)    Current diet: in general, an "unhealthy" diet  ------------------------------------------------------------------------ Follow up of Lung Nodules:    Patient was last seen for this problem 4 months ago. Management during that visit includes repeating a chest CT which showed lung nodules unchanged. Patient should repeat CT March 2018.     No Known Allergies   Current Outpatient Prescriptions:  .  buPROPion (WELLBUTRIN SR) 150 MG 12 hr tablet, Take 150 mg by mouth daily., Disp: , Rfl:  .  citalopram (CELEXA) 40 MG tablet, Take 1 tablet by mouth daily., Disp: , Rfl:  .  folic acid (FOLVITE) 1 MG tablet, Take 1 tablet by mouth daily., Disp: , Rfl:  .  gabapentin (NEURONTIN) 300  MG capsule, TAKE 2 CAPSULES BY MOUTH IN THE MORNING AND 3 CAPSULES AT BEDTIME, Disp: 150 capsule, Rfl: 5 .  glipiZIDE (GLUCOTROL XL) 2.5 MG 24 hr tablet, Take 1 tablet (2.5 mg total) by mouth daily., Disp: 90 tablet, Rfl: 4 .  metFORMIN (GLUCOPHAGE-XR) 500 MG 24 hr tablet, Take 2 tablets (1,000 mg total) by mouth 2 (two) times daily., Disp: 360 tablet, Rfl: 3 .  methotrexate (RHEUMATREX) 2.5 MG tablet, Take 6 tablets by mouth every 7 (seven) days., Disp: , Rfl:  .   mirtazapine (REMERON) 15 MG tablet, Take 1 tablet by mouth at bedtime., Disp: , Rfl:  .  predniSONE (DELTASONE) 5 MG tablet, Take 0.5 tablets by mouth every other day. , Disp: , Rfl:  .  valsartan-hydrochlorothiazide (DIOVAN-HCT) 80-12.5 MG tablet, TAKE 1 TABLET BY MOUTH ONCE DAILY, Disp: 90 tablet, Rfl: 4  Review of Systems  Constitutional: Positive for fatigue. Negative for appetite change, chills and fever.  Respiratory: Negative for chest tightness, shortness of breath and wheezing.   Cardiovascular: Negative for chest pain and palpitations.  Gastrointestinal: Negative for abdominal pain, nausea and vomiting.  Endocrine: Negative for cold intolerance, heat intolerance, polydipsia, polyphagia and polyuria.    Social History  Substance Use Topics  . Smoking status: Never Smoker  . Smokeless tobacco: Never Used  . Alcohol use 0.0 oz/week     Comment: Occasional use   Objective:   BP 120/82 (BP Location: Left Arm, Patient Position: Sitting, Cuff Size: Large)   Pulse 68   Temp 98 F (36.7 C) (Oral)   Resp 16   Wt (!) 315 lb (142.9 kg)   SpO2 97% Comment: room air  BMI 43.93 kg/m  Vitals:   02/14/17 0935  BP: 120/82  Pulse: 68  Resp: 16  Temp: 98 F (36.7 C)  TempSrc: Oral  SpO2: 97%  Weight: (!) 315 lb (142.9 kg)    Physical Exam  General Appearance:    Alert, cooperative, no distress, obese  Eyes:    PERRL, conjunctiva/corneas clear, EOM's intact       Lungs:     Clear to auscultation bilaterally, respirations unlabored  Heart:    Regular rate and rhythm  Neurologic:   Awake, alert, oriented x 3. No apparent focal neurological           defect.       Results for orders placed or performed in visit on 02/14/17  POCT HgB A1C  Result Value Ref Range   Hemoglobin A1C 6.1    Est. average glucose Bld gHb Est-mCnc 128        Assessment & Plan:     1. Type 2 diabetes mellitus with other neurologic complication, without long-term current use of insulin (HCC) Well  controlled.  Continue current medications.   - POCT HgB A1C - Comprehensive metabolic panel - Lipid panel  2. Hypertension, unspecified type Well controlled.  Continue current medications.   - Comprehensive metabolic panel - Lipid panel  3. Multiple lung nodules on CT Due for follow up  - CT CHEST W/O CONTRAST; Future  4. Obstructive sleep apnea States he is no having any issues with snoring or stopping breathing since losing weight.  5. Other fatigue  - CBC - TSH       Lelon Huh, MD  [facility]

## 2017-02-15 ENCOUNTER — Other Ambulatory Visit: Payer: Self-pay

## 2017-02-15 LAB — COMPREHENSIVE METABOLIC PANEL
ALT: 17 IU/L (ref 0–44)
AST: 18 IU/L (ref 0–40)
Albumin/Globulin Ratio: 1.5 (ref 1.2–2.2)
Albumin: 3.8 g/dL (ref 3.6–4.8)
Alkaline Phosphatase: 75 IU/L (ref 39–117)
BUN / CREAT RATIO: 12 (ref 10–24)
BUN: 14 mg/dL (ref 8–27)
Bilirubin Total: 0.3 mg/dL (ref 0.0–1.2)
CO2: 30 mmol/L — AB (ref 18–29)
CREATININE: 1.14 mg/dL (ref 0.76–1.27)
Calcium: 9.2 mg/dL (ref 8.6–10.2)
Chloride: 99 mmol/L (ref 96–106)
GFR calc non Af Amer: 67 mL/min/{1.73_m2} (ref >59)
GFR, EST AFRICAN AMERICAN: 77 mL/min/{1.73_m2} (ref >59)
GLUCOSE: 120 mg/dL — AB (ref 65–99)
Globulin, Total: 2.5 g/dL (ref 1.5–4.5)
Potassium: 4.3 mmol/L (ref 3.5–5.2)
Sodium: 142 mmol/L (ref 134–144)
TOTAL PROTEIN: 6.3 g/dL (ref 6.0–8.5)

## 2017-02-15 LAB — LIPID PANEL
CHOL/HDL RATIO: 3.2 ratio (ref 0.0–5.0)
Cholesterol, Total: 178 mg/dL (ref 100–199)
HDL: 55 mg/dL (ref >39)
LDL CALC: 97 mg/dL (ref 0–99)
Triglycerides: 129 mg/dL (ref 0–149)
VLDL Cholesterol Cal: 26 mg/dL (ref 5–40)

## 2017-02-15 LAB — CBC
HEMATOCRIT: 42.5 % (ref 37.5–51.0)
HEMOGLOBIN: 14.7 g/dL (ref 13.0–17.7)
MCH: 30.8 pg (ref 26.6–33.0)
MCHC: 34.6 g/dL (ref 31.5–35.7)
MCV: 89 fL (ref 79–97)
Platelets: 233 10*3/uL (ref 150–379)
RBC: 4.78 x10E6/uL (ref 4.14–5.80)
RDW: 14.4 % (ref 12.3–15.4)
WBC: 8.1 10*3/uL (ref 3.4–10.8)

## 2017-02-15 LAB — TSH: TSH: 3.7 u[IU]/mL (ref 0.450–4.500)

## 2017-02-15 MED ORDER — PRAVASTATIN SODIUM 40 MG PO TABS: 40.0000 mg | ORAL_TABLET | Freq: Every day | ORAL | 12 refills | Status: DC

## 2017-02-15 NOTE — Progress Notes (Unsigned)
Pravastatin

## 2017-02-15 NOTE — Progress Notes (Signed)
Advised and Rx sent in ED

## 2017-02-22 ENCOUNTER — Encounter: Payer: PPO | Attending: Urology | Admitting: *Deleted

## 2017-02-22 ENCOUNTER — Encounter: Payer: Self-pay | Admitting: *Deleted

## 2017-02-22 VITALS — Wt 313.5 lb

## 2017-02-22 DIAGNOSIS — Z6841 Body Mass Index (BMI) 40.0 and over, adult: Secondary | ICD-10-CM | POA: Insufficient documentation

## 2017-02-22 DIAGNOSIS — Z713 Dietary counseling and surveillance: Secondary | ICD-10-CM | POA: Diagnosis not present

## 2017-02-22 DIAGNOSIS — E119 Type 2 diabetes mellitus without complications: Secondary | ICD-10-CM | POA: Insufficient documentation

## 2017-02-22 DIAGNOSIS — E1149 Type 2 diabetes mellitus with other diabetic neurological complication: Secondary | ICD-10-CM

## 2017-02-22 NOTE — Progress Notes (Signed)
Appt. Start Time: 0900 Appt. End Time: 1200  Class 2 Nutritional Management - identify sources of carbohydrate, protein and fat; plan balanced meals; estimate servings of carbohydrates in meals  Psychosocial - identify DM as a source of stress; state the effects of stress on BG control  Exercise - describe the effects of exercise on blood glucose and importance of regular exercise in controlling diabetes; state a plan for personal exercise; verbalize contraindications for exercise  Self-Monitoring - state importance of SMBG; use SMBG results to effectively manage diabetes; identify importance of regular HbA1C testing and goals for results  Acute Complications - recognize hyperglycemia and hypoglycemia with causes and effects; identify blood glucose results as high, low or in control; list steps in treating and preventing high and low blood glucose  Sick Day Guidelines: state appropriate measure to manage blood glucose when ill (need for meds, HBGM plan, when to call physician, need for fluids)  Chronic Complications/Foot, Skin, Eye Dental Care - identify possible long-term complications of diabetes (retinopathy, neuropathy, nephropathy, cardiovascular disease, infections); explain steps in prevention and treatment of chronic complications; state importance of daily self-foot exams; describe how to examine feet and what to look for; explain appropriate eye and dental care  Lifestyle Changes/Goals - state benefits of making appropriate lifestyle changes; identify habits that need to change (meals, tobacco, alcohol); identify strategies to reduce risk factors for personal health  Pregnancy/Sexual Health - state importance of good blood glucose control in preventing sexual problems (impotence, vaginal dryness, infections, loss of desire)  Teaching Materials Used: Class 2 Slide Packet A1C Pamphlet Foot Care Literature Kidney Test Handout Quick and "Balanced" Meal Ideas Carb Counting and Meal  Planning Book Goals for Class 2

## 2017-02-28 DIAGNOSIS — Z79899 Other long term (current) drug therapy: Secondary | ICD-10-CM | POA: Diagnosis not present

## 2017-03-01 ENCOUNTER — Ambulatory Visit
Admission: RE | Admit: 2017-03-01 | Discharge: 2017-03-01 | Disposition: A | Discharge status: Home / Self Care | Payer: PPO | Source: Ambulatory Visit | Attending: Family Medicine | Admitting: Family Medicine

## 2017-03-01 DIAGNOSIS — R918 Other nonspecific abnormal finding of lung field: Secondary | ICD-10-CM | POA: Diagnosis not present

## 2017-03-01 DIAGNOSIS — I2510 Atherosclerotic heart disease of native coronary artery without angina pectoris: Secondary | ICD-10-CM | POA: Insufficient documentation

## 2017-03-01 DIAGNOSIS — Z8546 Personal history of malignant neoplasm of prostate: Secondary | ICD-10-CM | POA: Diagnosis not present

## 2017-03-01 DIAGNOSIS — R911 Solitary pulmonary nodule: Secondary | ICD-10-CM | POA: Diagnosis not present

## 2017-03-19 ENCOUNTER — Encounter: Payer: Self-pay | Admitting: Dietician

## 2017-05-02 DIAGNOSIS — F331 Major depressive disorder, recurrent, moderate: Secondary | ICD-10-CM | POA: Diagnosis not present

## 2017-05-07 DIAGNOSIS — E291 Testicular hypofunction: Secondary | ICD-10-CM | POA: Diagnosis not present

## 2017-05-07 DIAGNOSIS — C61 Malignant neoplasm of prostate: Secondary | ICD-10-CM | POA: Diagnosis not present

## 2017-05-07 DIAGNOSIS — N5201 Erectile dysfunction due to arterial insufficiency: Secondary | ICD-10-CM | POA: Diagnosis not present

## 2017-05-07 DIAGNOSIS — D400 Neoplasm of uncertain behavior of prostate: Secondary | ICD-10-CM | POA: Diagnosis not present

## 2017-05-08 DIAGNOSIS — E291 Testicular hypofunction: Secondary | ICD-10-CM | POA: Diagnosis not present

## 2017-05-08 DIAGNOSIS — E060 Acute thyroiditis: Secondary | ICD-10-CM | POA: Diagnosis not present

## 2017-05-08 DIAGNOSIS — C61 Malignant neoplasm of prostate: Secondary | ICD-10-CM | POA: Diagnosis not present

## 2017-05-08 DIAGNOSIS — D400 Neoplasm of uncertain behavior of prostate: Secondary | ICD-10-CM | POA: Diagnosis not present

## 2017-05-09 ENCOUNTER — Ambulatory Visit (INDEPENDENT_AMBULATORY_CARE_PROVIDER_SITE_OTHER): Payer: PPO

## 2017-05-09 VITALS — BP 122/78 | HR 76 | Temp 99.1°F | Ht 71.0 in | Wt 322.0 lb

## 2017-05-09 DIAGNOSIS — Z0000 Encounter for general adult medical examination without abnormal findings: Secondary | ICD-10-CM

## 2017-05-09 NOTE — Patient Instructions (Signed)
Edward King , Thank you for taking time to come for your Medicare Wellness Visit. I appreciate your ongoing commitment to your health goals. Please review the following plan we discussed and let me know if I can assist you in the future.   Screening recommendations/referrals: Colonoscopy: completed 11/05/07, due 10/2017 Recommended yearly ophthalmology/optometry visit for glaucoma screening and checkup Recommended yearly dental visit for hygiene and checkup  Vaccinations: Influenza vaccine: due 08/2017 Pneumococcal vaccine: declined today, will discuss with PCP at next OV on 05/11/17 Tdap vaccine: declined today, will discuss with PCP at next OV on 05/11/17 Shingles vaccine: completed 04/29/12   Advanced directives: Advance directive discussed with you today. I have provided a copy for you to complete at home and have notarized. Once this is complete please bring a copy in to our office so we can scan it into your chart.  Conditions/risks identified: Obesity, recommend increasing exercise. Pt to start walking 3 days a week for 30 minutes.  Next appointment: 05/11/17 @ 10 AM  Preventive Care 65 Years and Older, Male Preventive care refers to lifestyle choices and visits with your health care provider that can promote health and wellness. What does preventive care include?  A yearly physical exam. This is also called an annual well check.  Dental exams once or twice a year.  Routine eye exams. Ask your health care provider how often you should have your eyes checked.  Personal lifestyle choices, including:  Daily care of your teeth and gums.  Regular physical activity.  Eating a healthy diet.  Avoiding tobacco and drug use.  Limiting alcohol use.  Practicing safe sex.  Taking low doses of aspirin every day.  Taking vitamin and mineral supplements as recommended by your health care provider. What happens during an annual well check? The services and screenings done by your  health care provider during your annual well check will depend on your age, overall health, lifestyle risk factors, and family history of disease. Counseling  Your health care provider may ask you questions about your:  Alcohol use.  Tobacco use.  Drug use.  Emotional well-being.  Home and relationship well-being.  Sexual activity.  Eating habits.  History of falls.  Memory and ability to understand (cognition).  Work and work Statistician. Screening  You may have the following tests or measurements:  Height, weight, and BMI.  Blood pressure.  Lipid and cholesterol levels. These may be checked every 5 years, or more frequently if you are over 20 years old.  Skin check.  Lung cancer screening. You may have this screening every year starting at age 28 if you have a 30-pack-year history of smoking and currently smoke or have quit within the past 15 years.  Fecal occult blood test (FOBT) of the stool. You may have this test every year starting at age 74.  Flexible sigmoidoscopy or colonoscopy. You may have a sigmoidoscopy every 5 years or a colonoscopy every 10 years starting at age 5.  Prostate cancer screening. Recommendations will vary depending on your family history and other risks.  Hepatitis C blood test.  Hepatitis B blood test.  Sexually transmitted disease (STD) testing.  Diabetes screening. This is done by checking your blood sugar (glucose) after you have not eaten for a while (fasting). You may have this done every 1-3 years.  Abdominal aortic aneurysm (AAA) screening. You may need this if you are a current or former smoker.  Osteoporosis. You may be screened starting at age 59 if you  are at high risk. Talk with your health care provider about your test results, treatment options, and if necessary, the need for more tests. Vaccines  Your health care provider may recommend certain vaccines, such as:  Influenza vaccine. This is recommended every  year.  Tetanus, diphtheria, and acellular pertussis (Tdap, Td) vaccine. You may need a Td booster every 10 years.  Zoster vaccine. You may need this after age 67.  Pneumococcal 13-valent conjugate (PCV13) vaccine. One dose is recommended after age 56.  Pneumococcal polysaccharide (PPSV23) vaccine. One dose is recommended after age 36. Talk to your health care provider about which screenings and vaccines you need and how often you need them. This information is not intended to replace advice given to you by your health care provider. Make sure you discuss any questions you have with your health care provider. Document Released: 12/31/2015 Document Revised: 08/23/2016 Document Reviewed: 10/05/2015 Elsevier Interactive Patient Education  2017 [place] Prevention in the Home Falls can cause injuries. They can happen to people of all ages. There are many things you can do to make your home safe and to help prevent falls. What can I do on the outside of my home?  Regularly fix the edges of walkways and driveways and fix any cracks.  Remove anything that might make you trip as you walk through a door, such as a raised step or threshold.  Trim any bushes or trees on the path to your home.  Use bright outdoor lighting.  Clear any walking paths of anything that might make someone trip, such as rocks or tools.  Regularly check to see if handrails are loose or broken. Make sure that both sides of any steps have handrails.  Any raised decks and porches should have guardrails on the edges.  Have any leaves, snow, or ice cleared regularly.  Use sand or salt on walking paths during winter.  Clean up any spills in your garage right away. This includes oil or grease spills. What can I do in the bathroom?  Use night lights.  Install grab bars by the toilet and in the tub and shower. Do not use towel bars as grab bars.  Use non-skid mats or decals in the tub or shower.  If you  need to sit down in the shower, use a plastic, non-slip stool.  Keep the floor dry. Clean up any water that spills on the floor as soon as it happens.  Remove soap buildup in the tub or shower regularly.  Attach bath mats securely with double-sided non-slip rug tape.  Do not have throw rugs and other things on the floor that can make you trip. What can I do in the bedroom?  Use night lights.  Make sure that you have a light by your bed that is easy to reach.  Do not use any sheets or blankets that are too big for your bed. They should not hang down onto the floor.  Have a firm chair that has side arms. You can use this for support while you get dressed.  Do not have throw rugs and other things on the floor that can make you trip. What can I do in the kitchen?  Clean up any spills right away.  Avoid walking on wet floors.  Keep items that you use a lot in easy-to-reach places.  If you need to reach something above you, use a strong step stool that has a grab bar.  Keep electrical cords out  of the way.  Do not use floor polish or wax that makes floors slippery. If you must use wax, use non-skid floor wax.  Do not have throw rugs and other things on the floor that can make you trip. What can I do with my stairs?  Do not leave any items on the stairs.  Make sure that there are handrails on both sides of the stairs and use them. Fix handrails that are broken or loose. Make sure that handrails are as long as the stairways.  Check any carpeting to make sure that it is firmly attached to the stairs. Fix any carpet that is loose or worn.  Avoid having throw rugs at the top or bottom of the stairs. If you do have throw rugs, attach them to the floor with carpet tape.  Make sure that you have a light switch at the top of the stairs and the bottom of the stairs. If you do not have them, ask someone to add them for you. What else can I do to help prevent falls?  Wear shoes  that:  Do not have high heels.  Have rubber bottoms.  Are comfortable and fit you well.  Are closed at the toe. Do not wear sandals.  If you use a stepladder:  Make sure that it is fully opened. Do not climb a closed stepladder.  Make sure that both sides of the stepladder are locked into place.  Ask someone to hold it for you, if possible.  Clearly mark and make sure that you can see:  Any grab bars or handrails.  First and last steps.  Where the edge of each step is.  Use tools that help you move around (mobility aids) if they are needed. These include:  Canes.  Walkers.  Scooters.  Crutches.  Turn on the lights when you go into a dark area. Replace any light bulbs as soon as they burn out.  Set up your furniture so you have a clear path. Avoid moving your furniture around.  If any of your floors are uneven, fix them.  If there are any pets around you, be aware of where they are.  Review your medicines with your doctor. Some medicines can make you feel dizzy. This can increase your chance of falling. Ask your doctor what other things that you can do to help prevent falls. This information is not intended to replace advice given to you by your health care provider. Make sure you discuss any questions you have with your health care provider. Document Released: 09/30/2009 Document Revised: 05/11/2016 Document Reviewed: 01/08/2015 Elsevier Interactive Patient Education  2017 [employer].

## 2017-05-09 NOTE — Progress Notes (Signed)
Subjective:   Edward King is a 66 y.o. male who presents for an Initial Medicare Annual Wellness Visit.  Review of Systems  N/A  Cardiac Risk Factors include: advanced age (>69mn, >>34women);diabetes mellitus;dyslipidemia;hypertension;male gender;obesity (BMI >30kg/m2)    Objective:    Today's Vitals   05/09/17 1003  BP: 122/78  Pulse: 76  Temp: 99.1 F (37.3 C)  TempSrc: Oral  Weight: (!) 322 lb (146.1 kg)  Height: 5' 11"  (1.803 m)  PainSc: 0-No pain   Body mass index is 44.91 kg/m.  Current Medications (verified) Outpatient Encounter Prescriptions as of 05/09/2017  Medication Sig  . buPROPion (WELLBUTRIN SR) 150 MG 12 hr tablet Take 150 mg by mouth daily.  . citalopram (CELEXA) 40 MG tablet Take 1 tablet by mouth daily.  . Coenzyme Q10 (COQ-10) 100 MG CAPS Take 100 mg by mouth daily.  .Regino SchultzeBandages & Supports (MEDICAL COMPRESSION STOCKINGS) MISC by Does not apply route.  . folic acid (FOLVITE) 1 MG tablet Take 1 tablet by mouth daily.  .Marland Kitchengabapentin (NEURONTIN) 300 MG capsule TAKE 2 CAPSULES BY MOUTH IN THE MORNING AND 3 CAPSULES AT BEDTIME  . glipiZIDE (GLUCOTROL XL) 2.5 MG 24 hr tablet Take 1 tablet (2.5 mg total) by mouth daily.  . metFORMIN (GLUCOPHAGE-XR) 500 MG 24 hr tablet Take 2 tablets (1,000 mg total) by mouth 2 (two) times daily.  . methotrexate (RHEUMATREX) 2.5 MG tablet Take 4 tablets by mouth every 7 (seven) days.   . mirtazapine (REMERON) 15 MG tablet Take 1 tablet by mouth at bedtime.  . pravastatin (PRAVACHOL) 40 MG tablet Take 1 tablet (40 mg total) by mouth daily.  . predniSONE (DELTASONE) 5 MG tablet Take 0.5 tablets by mouth every other day.   . valsartan-hydrochlorothiazide (DIOVAN-HCT) 80-12.5 MG tablet TAKE 1 TABLET BY MOUTH ONCE DAILY  . Vitamin D, Ergocalciferol, (DRISDOL) 50000 units CAPS capsule Take 1.25 capsules by mouth once a week.   No facility-administered encounter medications on file as of 05/09/2017.     Allergies  (verified) Patient has no known allergies.   History: Past Medical History:  Diagnosis Date  . Depression   . Diabetes mellitus without complication ([place]   . Giant cell arteritis ([place] 06/15/2015  . History of chicken pox   . History of measles   . History of mumps   . Hypertension   . Lymph edema    both legs.  mostly left  . Neuromuscular disorder ([place]   . Prostate CA ([place] 2016   Radiation seed implants.  . Temporal arteritis ([place]    Clinically dx Dr. KJefm Bryant Negative temporal artrtery bx   Past Surgical History:  Procedure Laterality Date  . ABDOMINOPLASTY    . Carotid Doppler Ultrasound  08/22/2014   [facility]; Minimal plaque right. Minimal thisckening left Anterograde flow vertebrals  . CT of the head  08/22/2014   [facility]. Normal  . DOPPLER ECHOCARDIOGRAPHY  08/29/2014   EF=60-65%. Normal LVEF  . Myocardial Perfusion Scan  09/10/2012   Poor exercsie tolerance, but no evidence of stress induced myocardial ischemia. EF=66%. Dr. CClayborn Bigness . NASAL SEPTOPLASTY W/ TURBINOPLASTY Bilateral 02/10/2016   Procedure: NASAL SEPTOPLASTY WITH INFERIOR TURBINATE REDUCTION;  Surgeon: CCarloyn Manner MD;  Location: [facility] ORS;  Service: ENT;  Laterality: Bilateral;  . TEMPORAL ARTERY BIOPSY / LIGATION Left    Family History  Problem Relation Age of Onset  . Colon cancer Mother   . Coronary artery disease Mother   . Prostate cancer  Father   . Kidney disease Father   . Dementia Brother   . Diabetes Neg Hx    Social History   Occupational History  . Retired [employer]    Retired Sept, 2017   Social History Main Topics  . Smoking status: Never Smoker  . Smokeless tobacco: Never Used  . Alcohol use No  . Drug use: No  . Sexual activity: Not on file   Tobacco Counseling Counseling given: Not Answered   Activities of Daily Living In your present state of health, do you have any difficulty performing the following activities: 05/09/2017  Hearing? Y  Vision? Y    Difficulty concentrating or making decisions? Y  Walking or climbing stairs? N  Dressing or bathing? N  Doing errands, shopping? N  Preparing Food and eating ? N  Using the Toilet? N  In the past six months, have you accidently leaked urine? N  Do you have problems with loss of bowel control? N  Managing your Medications? N  Managing your Finances? N  Housekeeping or managing your Housekeeping? N  Some recent data might be hidden    Immunizations and Health Maintenance Immunization History  Administered Date(s) Administered  . Influenza Whole 10/18/2012  . Influenza,inj,Quad PF,36+ Mos 08/30/2015  . Pneumococcal Conjugate-13 11/17/2015  . Pneumococcal Polysaccharide-23 02/01/2006  . Zoster 04/29/2012   There are no preventive care reminders to display for this patient.  Patient Care Team: Birdie Sons, MD as PCP - General (Family Medicine) Yolonda Kida, MD as Consulting Physician (Cardiology) Manya Silvas, MD (Gastroenterology) Emmaline Kluver., MD as Consulting Physician (Rheumatology) Royston Cowper, MD as Consulting Physician (Urology) Karren Burly Deirdre Peer, MD as Referring Physician (Ophthalmology) Thurmond Butts Orson Ape, MD as Attending Physician (Psychiatry)  Indicate any recent Medical Services you may have received from other than [facility] providers in the past year (date may be approximate).    Assessment:   This is a routine wellness examination for Edward King.   Hearing/Vision screen Vision Screening Comments: Pt sees Dr Cephus Shelling for vision checks every 3 months.  Dietary issues and exercise activities discussed: Current Exercise Habits: The patient does not participate in regular exercise at present, Exercise limited by: Other - see comments (due to weather)  Goals    . Exercise 3x per week (30 min per time)          Recommend increasing exercise. Pt to start walking 3 days a week for 30 minutes.      Depression Screen PHQ 2/9 Scores 05/09/2017  11/27/2016 10/13/2016  PHQ - 2 Score 2 0 3  PHQ- 9 Score 7 - 9    Fall Risk Fall Risk  05/09/2017 02/22/2017 02/01/2017 12/28/2016 11/27/2016  Falls in the past year? No No - No No  Number falls in past yr: - - - - -  Injury with Fall? - - - - -  Risk for fall due to : - - (No Data) - -  Risk for fall due to (comments): - - no falls since previous visit - -  Follow up - - - - -    Cognitive Function:     6CIT Screen 05/09/2017  What Year? 0 points  What month? 0 points  What time? 0 points  Count back from 20 0 points  Months in reverse 2 points  Repeat phrase 4 points  Total Score 6    Screening Tests Health Maintenance  Topic Date Due  . FOOT  EXAM  05/10/2017 (Originally 09/16/1960)  . TETANUS/TDAP  05/10/2017 (Originally 09/16/1969)  . PNA vac Low Risk Adult (2 of 2 - PPSV23) 05/10/2017 (Originally 11/16/2016)  . INFLUENZA VACCINE  07/18/2017  . HEMOGLOBIN A1C  08/14/2017  . COLONOSCOPY  11/04/2017  . OPHTHALMOLOGY EXAM  02/15/2018  . Hepatitis C Screening  Completed        Plan:  I have personally reviewed and addressed the Medicare Annual Wellness questionnaire and have noted the following in the patient's chart:  A. Medical and social history B. Use of alcohol, tobacco or illicit drugs  C. Current medications and supplements D. Functional ability and status E.  Nutritional status F.  Physical activity G. Advance directives H. List of other physicians I.  Hospitalizations, surgeries, and ER visits in previous 12 months J.  [place] such as hearing and vision if needed, cognitive and depression L. Referrals and appointments - none  In addition, I have reviewed and discussed with patient certain preventive protocols, quality metrics, and best practice recommendations. A written personalized care plan for preventive services as well as general preventive health recommendations were provided to patient.  See attached scanned questionnaire for additional  information.   Signed,  Fabio Neighbors, LPN Nurse Health Advisor   MD Recommendations: Pt needs diabetic foot exam at next OV on 05/11/17. Pt declined the tetanus and pneumonia vaccine today. Pt would like to discuss these further with PCP and potentially receive at next OV.  I have reviewed the health advisor's note, was available for consultation, and agree with documentation and plan  Lelon Huh, MD

## 2017-05-11 ENCOUNTER — Ambulatory Visit (INDEPENDENT_AMBULATORY_CARE_PROVIDER_SITE_OTHER): Payer: PPO | Admitting: Family Medicine

## 2017-05-11 ENCOUNTER — Encounter: Payer: Self-pay | Admitting: Family Medicine

## 2017-05-11 VITALS — BP 122/72 | HR 72 | Temp 98.1°F | Resp 16 | Ht 71.0 in | Wt 315.0 lb

## 2017-05-11 DIAGNOSIS — Z1211 Encounter for screening for malignant neoplasm of colon: Secondary | ICD-10-CM

## 2017-05-11 DIAGNOSIS — Z0000 Encounter for general adult medical examination without abnormal findings: Secondary | ICD-10-CM | POA: Diagnosis not present

## 2017-05-11 DIAGNOSIS — I10 Essential (primary) hypertension: Secondary | ICD-10-CM

## 2017-05-11 DIAGNOSIS — G4733 Obstructive sleep apnea (adult) (pediatric): Secondary | ICD-10-CM

## 2017-05-11 DIAGNOSIS — Z23 Encounter for immunization: Secondary | ICD-10-CM | POA: Diagnosis not present

## 2017-05-11 DIAGNOSIS — E1149 Type 2 diabetes mellitus with other diabetic neurological complication: Secondary | ICD-10-CM

## 2017-05-11 DIAGNOSIS — R5383 Other fatigue: Secondary | ICD-10-CM | POA: Diagnosis not present

## 2017-05-11 DIAGNOSIS — G4710 Hypersomnia, unspecified: Secondary | ICD-10-CM | POA: Insufficient documentation

## 2017-05-11 NOTE — Patient Instructions (Signed)
Call Dr. Tiffany Kocher at [facility] to set up your routine screening colonoscopy

## 2017-05-11 NOTE — Progress Notes (Signed)
Patient: Edward King, Male    DOB: 26-Oct-1951, 66 y.o.   MRN: [id number] Visit Date: 05/11/2017  Today's Provider: Lelon Huh, MD   Chief Complaint  Patient presents with  . Annual Exam  . Hypertension  . Diabetes  . Fatigue   Subjective:    Annual physical Edward King is a 66 y.o. male. He feels well. He reports exercising not regularly. He reports he is sleeping well.   Hypertension, follow-up:  BP Readings from Last 3 Encounters:  05/11/17 122/72  05/09/17 122/78  02/14/17 120/82    He was last seen for hypertension 3 months ago.  BP at that visit was 120/82. Management since that visit includes no changes. He reports good compliance with treatment. He is not having side effects.  He is not exercising. He is adherent to low salt diet.   Outside blood pressures are checked occasionally. He is experiencing fatigue.  Patient denies exertional chest pressure/discomfort and palpitations.   Cardiovascular risk factors include diabetes mellitus and dyslipidemia.     Weight trend: stable Wt Readings from Last 3 Encounters:  05/11/17 (!) 315 lb (142.9 kg)  05/09/17 (!) 322 lb (146.1 kg)  02/22/17 (!) 313 lb 8 oz (142.2 kg)    Current diet: well balanced    Diabetes Mellitus Type II, Follow-up:   Lab Results  Component Value Date   HGBA1C 6.1 02/14/2017   HGBA1C 6.2 10/13/2016   HGBA1C 6.0 02/02/2016    Last seen for diabetes 3 months ago.  Management since then includes no changes. He reports good compliance with treatment. He is not having side effects.  Current symptoms include none and have been stable. Home blood sugar records: fasting range: 150 this morning  Episodes of hypoglycemia? no   Current Insulin Regimen: none Most Recent Eye Exam: due Weight trend: stable Prior visit with dietician: no Current diet: well balanced Current exercise: none  Pertinent Labs:    Component Value Date/Time   CHOL 178 02/14/2017 1022   CHOL 174 08/30/2014 0450   TRIG 129 02/14/2017 1022   TRIG 73 08/30/2014 0450   HDL 55 02/14/2017 1022   HDL 40 08/30/2014 0450   LDLCALC 97 02/14/2017 1022   LDLCALC 119 (H) 08/30/2014 0450   CREATININE 1.14 02/14/2017 1022   CREATININE 1.10 08/30/2014 0450    Wt Readings from Last 3 Encounters:  05/11/17 (!) 315 lb (142.9 kg)  05/09/17 (!) 322 lb (146.1 kg)  02/22/17 (!) 313 lb 8 oz (142.2 kg)     He also reports feeling much more fatigued the last couple of months. He has long history of hypogonadism but is not taking testosterone replacement due to history of prostate cancer followed by Dr. Eliberto Ivory. He does have remote history of sleep apnea which had resolved with weight loss, however he has gained much much of his previous weight and does not use CPAP.     Review of Systems  Constitutional: Negative.  Negative for appetite change, chills and fever.  Respiratory: Negative.  Negative for chest tightness, shortness of breath and wheezing.   Cardiovascular: Negative.  Negative for chest pain and palpitations.  Gastrointestinal: Negative for abdominal pain, nausea and vomiting.  Endocrine: Negative.   Musculoskeletal: Negative.   Neurological: Negative.     Social History   Social History  . Marital status: Married    Spouse name: N/A  . Number of children: N/A  . Years of education: N/A   Occupational  History  . Retired [employer]    Retired Sept, 2017   Social History Main Topics  . Smoking status: Never Smoker  . Smokeless tobacco: Never Used  . Alcohol use No  . Drug use: No  . Sexual activity: Not on file   Other Topics Concern  . Not on file   Social History Narrative  . No narrative on file    Past Medical History:  Diagnosis Date  . Depression   . Diabetes mellitus without complication ([place])   . Giant cell arteritis ([place]) 06/15/2015  . History of chicken pox   . History of measles   . History of mumps   . Hypertension   . Lymph  edema    both legs.  mostly left  . Neuromuscular disorder ([place])   . Prostate CA ([place]) 2016   Radiation seed implants.  . Temporal arteritis ([place])    Clinically dx Dr. Jefm Bryant. Negative temporal artrtery bx     Patient Active Problem List   Diagnosis Date Noted  . Multiple lung nodules on CT 02/29/2016  . 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 Hypertension 06/16/2015  . Acquired lymphedema 06/16/2015  . CA of prostate ([place]) 06/16/2015  . Obstructive sleep apnea 06/16/2015  . Diabetes mellitus with neurological manifestation ([place]) 05/06/2014  . Cough 02/28/2013  . Peripheral edema 02/28/2013    Past Surgical History:  Procedure Laterality Date  . ABDOMINOPLASTY    . Carotid Doppler Ultrasound  08/22/2014   [facility]; Minimal plaque right. Minimal thisckening left Anterograde flow vertebrals  . CT of the head  08/22/2014   [facility]. Normal  . DOPPLER ECHOCARDIOGRAPHY  08/29/2014   EF=60-65%. Normal LVEF  . Myocardial Perfusion Scan  09/10/2012   Poor exercsie tolerance, but no evidence of stress induced myocardial ischemia. EF=66%. Dr. Clayborn Bigness  . NASAL SEPTOPLASTY W/ TURBINOPLASTY Bilateral 02/10/2016   Procedure: NASAL SEPTOPLASTY WITH INFERIOR TURBINATE REDUCTION;  Surgeon: Carloyn Manner, MD;  Location: [facility] ORS;  Service: ENT;  Laterality: Bilateral;  . TEMPORAL ARTERY BIOPSY / LIGATION Left     His family history includes Colon cancer in his mother; Coronary artery disease in his mother; Dementia in his brother; Kidney disease in his father; Prostate cancer in his father.      Current Outpatient Prescriptions:  .  buPROPion (WELLBUTRIN SR) 150 MG 12 hr tablet, Take 150 mg by mouth daily., Disp: , Rfl:  .  citalopram (CELEXA) 40 MG tablet, Take 1 tablet by mouth daily., Disp: , Rfl:  .  Coenzyme Q10 (COQ-10) 100 MG CAPS, Take 100 mg by mouth daily., Disp: , Rfl:  .  Elastic Bandages & Supports ([place]) MISC, by Does not apply route., Disp: , Rfl:  .  folic  acid (FOLVITE) 1 MG tablet, Take 1 tablet by mouth daily., Disp: , Rfl:  .  gabapentin (NEURONTIN) 300 MG capsule, TAKE 2 CAPSULES BY MOUTH IN THE MORNING AND 3 CAPSULES AT BEDTIME, Disp: 150 capsule, Rfl: 5 .  glipiZIDE (GLUCOTROL XL) 2.5 MG 24 hr tablet, Take 1 tablet (2.5 mg total) by mouth daily., Disp: 90 tablet, Rfl: 4 .  metFORMIN (GLUCOPHAGE-XR) 500 MG 24 hr tablet, Take 2 tablets (1,000 mg total) by mouth 2 (two) times daily., Disp: 360 tablet, Rfl: 3 .  methotrexate (RHEUMATREX) 2.5 MG tablet, Take 4 tablets by mouth every 7 (seven) days. , Disp: , Rfl:  .  mirtazapine (REMERON) 15 MG tablet, Take 1 tablet by mouth at bedtime., Disp: ,  Rfl:  .  pravastatin (PRAVACHOL) 40 MG tablet, Take 1 tablet (40 mg total) by mouth daily., Disp: 30 tablet, Rfl: 12 .  predniSONE (DELTASONE) 5 MG tablet, Take 0.5 tablets by mouth every other day. , Disp: , Rfl:  .  valsartan-hydrochlorothiazide (DIOVAN-HCT) 80-12.5 MG tablet, TAKE 1 TABLET BY MOUTH ONCE DAILY, Disp: 90 tablet, Rfl: 4 .  Vitamin D, Ergocalciferol, (DRISDOL) 50000 units CAPS capsule, Take 1.25 capsules by mouth once a week., Disp: , Rfl: 3  Patient Care Team: Birdie Sons, MD as PCP - General (Family Medicine) Yolonda Kida, MD as Consulting Physician (Cardiology) Manya Silvas, MD (Gastroenterology) Emmaline Kluver., MD as Consulting Physician (Rheumatology) Royston Cowper, MD as Consulting Physician (Urology) Karren Burly, Deirdre Peer, MD as Referring Physician (Ophthalmology) Lew Dawes, MD as Attending Physician (Psychiatry)     Objective:   Vitals: BP 122/72 (BP Location: Right Arm, Patient Position: Sitting, Cuff Size: Large)   Pulse 72   Temp 98.1 F (36.7 C)   Resp 16   Ht 5' 11"  (1.803 m)   Wt (!) 315 lb (142.9 kg)   SpO2 94%   BMI 43.93 kg/m   Physical Exam   General Appearance:    Alert, cooperative, no distress, appears stated age, morbidly obese  Head:    Normocephalic, without  obvious abnormality, atraumatic  Eyes:    PERRL, conjunctiva/corneas clear, EOM's intact, fundi    benign, both eyes       Ears:    Normal TM's and external ear canals, both ears  Nose:   Nares normal, septum midline, mucosa normal, no drainage   or sinus tenderness  Throat:   Lips, mucosa, and tongue normal; teeth and gums normal  Neck:   Supple, symmetrical, trachea midline, no adenopathy;       thyroid:  No enlargement/tenderness/nodules; no carotid   bruit or JVD  Back:     Symmetric, no curvature, ROM normal, no CVA tenderness  Lungs:     Clear to auscultation bilaterally, respirations unlabored  Chest wall:    No tenderness or deformity  Heart:    Regular rate and rhythm, S1 and S2 normal, no murmur, rub   or gallop  Abdomen:     Soft, non-tender, bowel sounds active all four quadrants,    no masses, no organomegaly  Genitalia:    deferred  Rectal:    deferred  Extremities:   Extremities normal, atraumatic, no cyanosis or edema  Pulses:   2+ and symmetric all extremities  Skin:   Skin color, texture, turgor normal, no rashes or lesions  Lymph nodes:   Cervical, supraclavicular, and axillary nodes normal  Neurologic:   CNII-XII intact. Normal strength, sensation and reflexes      throughout    Activities of Daily Living In your present state of health, do you have any difficulty performing the following activities: 05/09/2017  Hearing? Y  Vision? Y  Difficulty concentrating or making decisions? Y  Walking or climbing stairs? N  Dressing or bathing? N  Doing errands, shopping? N  Preparing Food and eating ? N  Using the Toilet? N  In the past six months, have you accidently leaked urine? N  Do you have problems with loss of bowel control? N  Managing your Medications? N  Managing your Finances? N  Housekeeping or managing your Housekeeping? N  Some recent data might be hidden    Fall Risk Assessment Fall Risk  05/09/2017 02/22/2017 02/01/2017 12/28/2016  11/27/2016  Falls  in the past year? No No - No No  Number falls in past yr: - - - - -  Injury with Fall? - - - - -  Risk for fall due to : - - (No Data) - -  Risk for fall due to (comments): - - no falls since previous visit - -  Follow up - - - - -     Depression Screen PHQ 2/9 Scores 05/09/2017 11/27/2016 10/13/2016  PHQ - 2 Score 2 0 3  PHQ- 9 Score 7 - 9    6CIT Screen 05/09/2017  What Year? 0 points  What month? 0 points  What time? 0 points  Count back from 20 0 points  Months in reverse 2 points  Repeat phrase 4 points  Total Score 6        Assessment & Plan:     Annual physical  Reviewed patient's Family Medical History Reviewed and updated list of patient's medical providers Assessment of cognitive impairment was done Assessed patient's functional ability Established a written schedule for health screening [place] Completed and Reviewed  Exercise Activities and Dietary recommendations Goals    . Exercise 3x per week (30 min per time)          Recommend increasing exercise. Pt to start walking 3 days a week for 30 minutes.       Immunization History  Administered Date(s) Administered  . Influenza Whole 10/18/2012  . Influenza,inj,Quad PF,36+ Mos 08/30/2015  . Pneumococcal Conjugate-13 11/17/2015  . Pneumococcal Polysaccharide-23 02/01/2006  . Zoster 04/29/2012    Health Maintenance  Topic Date Due  . FOOT EXAM  09/16/1960  . TETANUS/TDAP  09/16/1969  . PNA vac Low Risk Adult (2 of 2 - PPSV23) 11/16/2016  . INFLUENZA VACCINE  07/18/2017  . HEMOGLOBIN A1C  08/14/2017  . COLONOSCOPY  11/04/2017  . OPHTHALMOLOGY EXAM  02/15/2018  . Hepatitis C Screening  Completed     Discussed health benefits of physical activity, and encouraged him to engage in regular exercise appropriate for his age and condition.     1. Annual physical exam  - EKG 12-Lead  2. Hypertension, unspecified type supraclavicular Continue current medications.   - EKG  12-Lead  3. Obstructive sleep apnea Consider new sleep study if labs are normal.   4. Hypersomnia   5. Colon cancer screening He is due for follow up colonoscopy and states he has contact information to schedule appt at [facility]  6. Need for pneumococcal vaccination  - Pneumococcal polysaccharide vaccine 23-valent greater than or equal to 2yo subcutaneous/IM  7. Other fatigue Suspect OSA, possible hypogonadism which is followed by Dr. Eliberto Ivory.  - Comprehensive metabolic panel - CBC - Lipid panel - T4 AND TSH  8. Type 2 diabetes mellitus with other neurologic complication, without long-term current use of insulin (HCC)  - Hemoglobin A1c  9. Need for prophylactic vaccination using tetanus and diphtheria toxoids adsorbed (Td) vaccine  - Tdap vaccine greater than or equal to 7yo IM    Lelon Huh, MD  [facility]

## 2017-05-12 LAB — COMPREHENSIVE METABOLIC PANEL
ALBUMIN: 4 g/dL (ref 3.6–4.8)
ALT: 15 IU/L (ref 0–44)
AST: 14 IU/L (ref 0–40)
Albumin/Globulin Ratio: 1.5 (ref 1.2–2.2)
Alkaline Phosphatase: 78 IU/L (ref 39–117)
BUN/Creatinine Ratio: 13 (ref 10–24)
BUN: 14 mg/dL (ref 8–27)
Bilirubin Total: 0.4 mg/dL (ref 0.0–1.2)
CALCIUM: 9 mg/dL (ref 8.6–10.2)
CO2: 26 mmol/L (ref 18–29)
CREATININE: 1.06 mg/dL (ref 0.76–1.27)
Chloride: 100 mmol/L (ref 96–106)
GFR, EST AFRICAN AMERICAN: 84 mL/min/{1.73_m2} (ref >59)
GFR, EST NON AFRICAN AMERICAN: 73 mL/min/{1.73_m2} (ref >59)
GLOBULIN, TOTAL: 2.6 g/dL (ref 1.5–4.5)
Glucose: 137 mg/dL — ABNORMAL HIGH (ref 65–99)
Potassium: 4.1 mmol/L (ref 3.5–5.2)
SODIUM: 139 mmol/L (ref 134–144)
TOTAL PROTEIN: 6.6 g/dL (ref 6.0–8.5)

## 2017-05-12 LAB — T4 AND TSH
T4, Total: 11.2 ug/dL (ref 4.5–12.0)
TSH: 2.19 u[IU]/mL (ref 0.450–4.500)

## 2017-05-12 LAB — CBC
Hematocrit: 45.2 % (ref 37.5–51.0)
Hemoglobin: 15.1 g/dL (ref 13.0–17.7)
MCH: 29.9 pg (ref 26.6–33.0)
MCHC: 33.4 g/dL (ref 31.5–35.7)
MCV: 90 fL (ref 79–97)
PLATELETS: 224 10*3/uL (ref 150–379)
RBC: 5.05 x10E6/uL (ref 4.14–5.80)
RDW: 15 % (ref 12.3–15.4)
WBC: 9.1 10*3/uL (ref 3.4–10.8)

## 2017-05-12 LAB — LIPID PANEL
Chol/HDL Ratio: 2.4 ratio (ref 0.0–5.0)
Cholesterol, Total: 147 mg/dL (ref 100–199)
HDL: 62 mg/dL (ref >39)
LDL CALC: 63 mg/dL (ref 0–99)
Triglycerides: 110 mg/dL (ref 0–149)
VLDL Cholesterol Cal: 22 mg/dL (ref 5–40)

## 2017-05-12 LAB — HEMOGLOBIN A1C
ESTIMATED AVERAGE GLUCOSE: 137 mg/dL
HEMOGLOBIN A1C: 6.4 % — AB (ref 4.8–5.6)

## 2017-05-15 ENCOUNTER — Telehealth: Payer: Self-pay

## 2017-05-15 NOTE — Telephone Encounter (Signed)
-----   Message from Birdie Sons, MD sent at 05/12/2017  7:27 PM EDT ----- Average sugar is up to 137, borderline for diabetes. Need to cut back as much as possible on sweets and starchy foods. Rest of labs are completely normal. Recommend proceeding with sleep study to see if he is having apnea causing fatigue.

## 2017-05-15 NOTE — Telephone Encounter (Signed)
Patient was advised, he states that he will call office back and let us know when he will be able to proceed with sleep study. KW

## 2017-05-21 DIAGNOSIS — R7989 Other specified abnormal findings of blood chemistry: Secondary | ICD-10-CM | POA: Diagnosis not present

## 2017-05-21 DIAGNOSIS — Z79899 Other long term (current) drug therapy: Secondary | ICD-10-CM | POA: Diagnosis not present

## 2017-05-24 ENCOUNTER — Other Ambulatory Visit: Payer: Self-pay | Admitting: Family Medicine

## 2017-05-25 ENCOUNTER — Other Ambulatory Visit: Payer: Self-pay | Admitting: Family Medicine

## 2017-07-11 ENCOUNTER — Other Ambulatory Visit: Payer: Self-pay | Admitting: Family Medicine

## 2017-07-11 MED ORDER — GABAPENTIN 300 MG PO CAPS: ORAL_CAPSULE | ORAL | 5 refills | Status: DC

## 2017-07-11 NOTE — Telephone Encounter (Signed)
Pt contacted office for refill request on the following medications:  gabapentin (NEURONTIN) 300 MG capsule  [facility].  [phone]

## 2017-07-18 DIAGNOSIS — H34232 Retinal artery branch occlusion, left eye: Secondary | ICD-10-CM | POA: Diagnosis not present

## 2017-07-19 DIAGNOSIS — M316 Other giant cell arteritis: Secondary | ICD-10-CM | POA: Diagnosis not present

## 2017-07-19 DIAGNOSIS — R0602 Shortness of breath: Secondary | ICD-10-CM | POA: Diagnosis not present

## 2017-07-19 DIAGNOSIS — E669 Obesity, unspecified: Secondary | ICD-10-CM | POA: Diagnosis not present

## 2017-07-19 DIAGNOSIS — G4733 Obstructive sleep apnea (adult) (pediatric): Secondary | ICD-10-CM | POA: Diagnosis not present

## 2017-07-19 DIAGNOSIS — R601 Generalized edema: Secondary | ICD-10-CM | POA: Diagnosis not present

## 2017-07-19 DIAGNOSIS — I10 Essential (primary) hypertension: Secondary | ICD-10-CM | POA: Diagnosis not present

## 2017-07-19 DIAGNOSIS — F329 Major depressive disorder, single episode, unspecified: Secondary | ICD-10-CM | POA: Diagnosis not present

## 2017-07-19 DIAGNOSIS — I209 Angina pectoris, unspecified: Secondary | ICD-10-CM | POA: Diagnosis not present

## 2017-07-19 DIAGNOSIS — I890 Lymphedema, not elsewhere classified: Secondary | ICD-10-CM | POA: Diagnosis not present

## 2017-07-19 DIAGNOSIS — E119 Type 2 diabetes mellitus without complications: Secondary | ICD-10-CM | POA: Diagnosis not present

## 2017-07-25 DIAGNOSIS — M316 Other giant cell arteritis: Secondary | ICD-10-CM | POA: Diagnosis not present

## 2017-07-26 DIAGNOSIS — M170 Bilateral primary osteoarthritis of knee: Secondary | ICD-10-CM | POA: Diagnosis not present

## 2017-08-01 DIAGNOSIS — F331 Major depressive disorder, recurrent, moderate: Secondary | ICD-10-CM | POA: Diagnosis not present

## 2017-10-01 DIAGNOSIS — B351 Tinea unguium: Secondary | ICD-10-CM | POA: Diagnosis not present

## 2017-10-01 DIAGNOSIS — E1159 Type 2 diabetes mellitus with other circulatory complications: Secondary | ICD-10-CM | POA: Diagnosis not present

## 2017-10-13 ENCOUNTER — Ambulatory Visit (INDEPENDENT_AMBULATORY_CARE_PROVIDER_SITE_OTHER): Payer: PPO

## 2017-10-13 DIAGNOSIS — Z23 Encounter for immunization: Secondary | ICD-10-CM | POA: Diagnosis not present

## 2017-10-25 DIAGNOSIS — M316 Other giant cell arteritis: Secondary | ICD-10-CM | POA: Diagnosis not present

## 2017-10-29 DIAGNOSIS — M545 Low back pain: Secondary | ICD-10-CM | POA: Diagnosis not present

## 2017-11-12 ENCOUNTER — Ambulatory Visit (INDEPENDENT_AMBULATORY_CARE_PROVIDER_SITE_OTHER): Payer: PPO | Admitting: Family Medicine

## 2017-11-12 ENCOUNTER — Encounter: Payer: Self-pay | Admitting: Family Medicine

## 2017-11-12 VITALS — BP 110/74 | HR 74 | Temp 97.9°F | Resp 16 | Ht 71.0 in | Wt 345.0 lb

## 2017-11-12 DIAGNOSIS — I10 Essential (primary) hypertension: Secondary | ICD-10-CM

## 2017-11-12 DIAGNOSIS — J069 Acute upper respiratory infection, unspecified: Secondary | ICD-10-CM | POA: Diagnosis not present

## 2017-11-12 DIAGNOSIS — E1149 Type 2 diabetes mellitus with other diabetic neurological complication: Secondary | ICD-10-CM

## 2017-11-12 LAB — POCT UA - MICROALBUMIN: MICROALBUMIN (UR) POC: NEGATIVE mg/L

## 2017-11-12 LAB — POCT GLYCOSYLATED HEMOGLOBIN (HGB A1C)
Est. average glucose Bld gHb Est-mCnc: 148
Hemoglobin A1C: 6.8

## 2017-11-12 MED ORDER — GLIPIZIDE ER 2.5 MG PO TB24: 2.5000 mg | ORAL_TABLET | Freq: Every day | ORAL | 4 refills | Status: DC

## 2017-11-12 NOTE — Progress Notes (Signed)
Patient: Edward King Male    DOB: [birth year]   66 y.o.   MRN: [id number] Visit Date: 11/12/2017  Today's Provider: Lelon Huh, MD   Chief Complaint  Patient presents with  . Follow-up  . Diabetes  . Hypertension   Subjective:    HPI   Diabetes Mellitus Type II, Follow-up:   Lab Results  Component Value Date   HGBA1C 6.4 (H) 05/11/2017   HGBA1C 6.1 02/14/2017   HGBA1C 6.2 10/13/2016   Last seen for diabetes 6 months ago.  Management since then includes; labs checked, recommended he cut back on sweets and starchy foods. He reports good compliance with treatment. He is not having side effects. none Current symptoms include none and have been unchanged. Home blood sugar records: fasting range: up and down  Episodes of hypoglycemia? no   Current Insulin Regimen: n/a Most Recent Eye Exam: 08/18/17 Weight trend: stable Prior visit with dietician: no Current diet: in general, an "unhealthy" diet Current exercise: walking  ----------------------------------------------------------------   Hypertension, follow-up:  BP Readings from Last 3 Encounters:  11/12/17 110/74  05/11/17 122/72  05/09/17 122/78    He was last seen for hypertension 6 months ago.  BP at that visit was 122/72. Management since that visit includes; no changes.He reports good compliance with treatment. He is not having side effects. none He is exercising. He is adherent to low salt diet.   Outside blood pressures are normal. He is experiencing none.  Patient denies none.   Cardiovascular risk factors include diabetes mellitus.  Use of agents associated with hypertension: none.   ----------------------------------------------------------------   Patient has been having nasal congestion and drainage for 2 weeks.   No Known Allergies   Current Outpatient Medications:  .  buPROPion (WELLBUTRIN SR) 150 MG 12 hr tablet, Take 150 mg by mouth daily., Disp: , Rfl:  .   Cholecalciferol (VITAMIN D3) 5000 units CAPS, Take 5,000 Units by mouth daily., Disp: , Rfl:  .  citalopram (CELEXA) 40 MG tablet, Take 1 tablet by mouth daily., Disp: , Rfl:  .  Coenzyme Q10 (COQ-10) 100 MG CAPS, Take 100 mg by mouth daily., Disp: , Rfl:  .  Elastic Bandages & Supports ([place]) MISC, by Does not apply route., Disp: , Rfl:  .  folic acid (FOLVITE) 1 MG tablet, Take 1 tablet by mouth daily., Disp: , Rfl:  .  gabapentin (NEURONTIN) 300 MG capsule, TAKE 2 CAPSULES BY MOUTH IN THE MORNING AND 3 CAPSULES AT BEDTIME, Disp: 150 capsule, Rfl: 5 .  glipiZIDE (GLUCOTROL XL) 2.5 MG 24 hr tablet, Take 1 tablet (2.5 mg total) by mouth daily., Disp: 90 tablet, Rfl: 4 .  metFORMIN (GLUCOPHAGE-XR) 500 MG 24 hr tablet, Take 2 tablets (1,000 mg total) by mouth 2 (two) times daily., Disp: 360 tablet, Rfl: 3 .  mirtazapine (REMERON) 15 MG tablet, Take 1 tablet by mouth at bedtime., Disp: , Rfl:  .  pravastatin (PRAVACHOL) 40 MG tablet, Take 1 tablet (40 mg total) by mouth daily., Disp: 30 tablet, Rfl: 12 .  predniSONE (DELTASONE) 5 MG tablet, Take 0.5 tablets by mouth every other day. , Disp: , Rfl:  .  valsartan-hydrochlorothiazide (DIOVAN-HCT) 80-12.5 MG tablet, TAKE 1 TABLET BY MOUTH EVERY DAY, Disp: 90 tablet, Rfl: 4 .  methotrexate (RHEUMATREX) 2.5 MG tablet, Take 4 tablets by mouth every 7 (seven) days. , Disp: , Rfl:  .  Vitamin D, Ergocalciferol, (DRISDOL) 50000 units CAPS capsule, Take  1.25 capsules by mouth once a week., Disp: , Rfl: 3  Review of Systems  Constitutional: Negative for appetite change, chills and fever.  HENT: Positive for congestion.   Respiratory: Negative for chest tightness, shortness of breath and wheezing.   Cardiovascular: Negative for chest pain and palpitations.  Gastrointestinal: Negative for abdominal pain, nausea and vomiting.    Social History   Tobacco Use  . Smoking status: Never Smoker  . Smokeless tobacco: Never Used  Substance  Use Topics  . Alcohol use: No    Alcohol/week: 0.0 oz   Objective:   BP 110/74 (BP Location: Right Arm, Patient Position: Sitting, Cuff Size: Large)   Temp 97.9 F (36.6 C) (Oral)   Resp 16   Ht 5' 11"  (1.803 m)   Wt (!) 345 lb (156.5 kg)   BMI 48.12 kg/m  Vitals:   11/12/17 1358  BP: 110/74  Resp: 16  Temp: 97.9 F (36.6 C)  TempSrc: Oral  Weight: (!) 345 lb (156.5 kg)  Height: 5' 11"  (1.803 m)     Physical Exam  General Appearance:    Alert, cooperative, no distress  HENT:   bilateral TM normal without fluid or infection, neck without nodes, throat normal without erythema or exudate, sinuses nontender, post nasal drip noted and nasal mucosa pale and congested  Eyes:    PERRL, conjunctiva/corneas clear, EOM's intact       Lungs:     Clear to auscultation bilaterally, respirations unlabored  Heart:    Regular rate and rhythm  Neurologic:   Awake, alert, oriented x 3. No apparent focal neurological           defect.       Results for orders placed or performed in visit on 11/12/17  POCT glycosylated hemoglobin (Hb A1C)  Result Value Ref Range   Hemoglobin A1C 6.8    Est. average glucose Bld gHb Est-mCnc 148   POCT UA - Microalbumin  Result Value Ref Range   Microalbumin Ur, POC negative mg/L       Assessment & Plan:     1. Type 2 diabetes mellitus with other neurologic complication, without long-term current use of insulin (HCC) Stable, but a1c increasing. Work on improving diet and increase exercise.  - POCT glycosylated hemoglobin (Hb A1C) - POCT UA - Microalbumin - glipiZIDE (GLUCOTROL XL) 2.5 MG 24 hr tablet; Take 1 tablet (2.5 mg total) by mouth daily.  Dispense: 90 tablet; Refill: 4  2. Upper respiratory tract infection, unspecified type Counseled regarding signs and symptoms of viral and bacterial respiratory infections. Advised to call or return for additional evaluation if he develops any sign of bacterial infection, or if current symptoms last longer  than 10 days.    3. Hypertension, unspecified type Well controlled.  Continue current medications.    Follow up 3 months.        Lelon Huh, MD  [facility]

## 2017-11-12 NOTE — Patient Instructions (Addendum)
   You are due for a screening colonoscopy. Please call [facility] at your earliest convenience.

## 2017-11-28 ENCOUNTER — Other Ambulatory Visit: Payer: Self-pay | Admitting: Family Medicine

## 2017-11-28 DIAGNOSIS — N5201 Erectile dysfunction due to arterial insufficiency: Secondary | ICD-10-CM | POA: Diagnosis not present

## 2017-11-28 DIAGNOSIS — E291 Testicular hypofunction: Secondary | ICD-10-CM | POA: Diagnosis not present

## 2017-11-28 DIAGNOSIS — C61 Malignant neoplasm of prostate: Secondary | ICD-10-CM | POA: Diagnosis not present

## 2017-11-28 DIAGNOSIS — D400 Neoplasm of uncertain behavior of prostate: Secondary | ICD-10-CM | POA: Diagnosis not present

## 2017-11-28 DIAGNOSIS — M316 Other giant cell arteritis: Secondary | ICD-10-CM | POA: Diagnosis not present

## 2017-11-28 NOTE — Telephone Encounter (Signed)
Pharmacy requesting refills. Thanks!  

## 2017-12-07 DIAGNOSIS — F331 Major depressive disorder, recurrent, moderate: Secondary | ICD-10-CM | POA: Diagnosis not present

## 2017-12-21 DIAGNOSIS — Z800 Family history of malignant neoplasm of digestive organs: Secondary | ICD-10-CM | POA: Diagnosis not present

## 2017-12-21 DIAGNOSIS — Z8719 Personal history of other diseases of the digestive system: Secondary | ICD-10-CM | POA: Diagnosis not present

## 2018-01-21 DIAGNOSIS — E119 Type 2 diabetes mellitus without complications: Secondary | ICD-10-CM | POA: Diagnosis not present

## 2018-01-21 DIAGNOSIS — H34232 Retinal artery branch occlusion, left eye: Secondary | ICD-10-CM | POA: Diagnosis not present

## 2018-01-21 LAB — HM DIABETES EYE EXAM

## 2018-01-25 ENCOUNTER — Ambulatory Visit: Payer: Self-pay | Admitting: Family Medicine

## 2018-01-25 ENCOUNTER — Encounter: Payer: Self-pay | Admitting: Family Medicine

## 2018-01-25 ENCOUNTER — Ambulatory Visit (INDEPENDENT_AMBULATORY_CARE_PROVIDER_SITE_OTHER): Payer: PPO | Admitting: Family Medicine

## 2018-01-25 VITALS — BP 126/68 | HR 88 | Temp 98.5°F | Resp 16 | Ht 71.0 in | Wt 341.0 lb

## 2018-01-25 DIAGNOSIS — R197 Diarrhea, unspecified: Secondary | ICD-10-CM

## 2018-01-25 DIAGNOSIS — R918 Other nonspecific abnormal finding of lung field: Secondary | ICD-10-CM | POA: Diagnosis not present

## 2018-01-25 DIAGNOSIS — R911 Solitary pulmonary nodule: Secondary | ICD-10-CM | POA: Diagnosis not present

## 2018-01-25 DIAGNOSIS — J329 Chronic sinusitis, unspecified: Secondary | ICD-10-CM

## 2018-01-25 MED ORDER — AZITHROMYCIN 250 MG PO TABS: ORAL_TABLET | ORAL | 0 refills | Status: DC

## 2018-01-25 MED ORDER — AZITHROMYCIN 250 MG PO TABS: ORAL_TABLET | ORAL | 0 refills | Status: AC

## 2018-01-25 NOTE — Progress Notes (Signed)
Patient: Edward King Male    DOB: 1951-12-18   67 y.o.   MRN: [id number] Visit Date: 01/25/2018  Today's Provider: Lelon Huh, MD   Chief Complaint  Patient presents with  . Cough  . Diarrhea   Subjective:    Cough  This is a new problem. The current episode started 1 to 4 weeks ago (about 2 weeks). The problem has been unchanged. The cough is productive of sputum. Associated symptoms include headaches, nasal congestion and postnasal drip. Pertinent negatives include no fever. Nothing aggravates the symptoms. He has tried nothing for the symptoms. There is no history of asthma or environmental allergies.  Diarrhea   This is a new problem. The current episode started 1 to 4 weeks ago (close to 4 weeks). The problem occurs 2 to 4 times per day. The problem has been unchanged. The patient states that diarrhea does not awaken him from sleep. Associated symptoms include coughing, headaches and increased flatus. Pertinent negatives include no fever or vomiting. Nothing aggravates the symptoms. There are no known risk factors. He has tried nothing for the symptoms.   Patient reports that he has diarrhea at least 54mns after each meal. He denies any blood or mucus. He does occasionally have stomach pain.  Up to 5-6 episodes in a day with watery stool. Will last a few days then resolve for few days. Has had c. Diff exposure.     No Known Allergies   Current Outpatient Medications:  .  buPROPion (WELLBUTRIN SR) 150 MG 12 hr tablet, Take 150 mg by mouth daily., Disp: , Rfl:  .  Cholecalciferol (VITAMIN D3) 5000 units CAPS, Take 5,000 Units by mouth daily., Disp: , Rfl:  .  citalopram (CELEXA) 40 MG tablet, Take 1 tablet by mouth daily., Disp: , Rfl:  .  Coenzyme Q10 (COQ-10) 100 MG CAPS, Take 100 mg by mouth daily., Disp: , Rfl:  .  Elastic Bandages & Supports ([place] MISC, by Does not apply route., Disp: , Rfl:  .  folic acid (FOLVITE) 1 MG tablet, Take 1  tablet by mouth daily., Disp: , Rfl:  .  gabapentin (NEURONTIN) 300 MG capsule, TAKE 2 CAPSULES BY MOUTH IN THE MORNING AND 3 CAPSULES AT BEDTIME, Disp: 150 capsule, Rfl: 5 .  glipiZIDE (GLUCOTROL XL) 2.5 MG 24 hr tablet, Take 1 tablet (2.5 mg total) by mouth daily., Disp: 90 tablet, Rfl: 4 .  metFORMIN (GLUCOPHAGE-XR) 500 MG 24 hr tablet, TAKE 2 TABLETS (1,000 MG TOTAL) BY MOUTH 2 (TWO) TIMES DAILY., Disp: 360 tablet, Rfl: 4 .  methotrexate (RHEUMATREX) 2.5 MG tablet, Take 4 tablets by mouth every 7 (seven) days. , Disp: , Rfl:  .  mirtazapine (REMERON) 15 MG tablet, Take 0.5 tablets by mouth at bedtime. , Disp: , Rfl:  .  pravastatin (PRAVACHOL) 40 MG tablet, Take 1 tablet (40 mg total) by mouth daily., Disp: 30 tablet, Rfl: 12 .  predniSONE (DELTASONE) 5 MG tablet, Take 0.5 tablets by mouth every other day. , Disp: , Rfl:  .  valsartan-hydrochlorothiazide (DIOVAN-HCT) 80-12.5 MG tablet, TAKE 1 TABLET BY MOUTH EVERY DAY, Disp: 90 tablet, Rfl: 4 .  Vitamin D, Ergocalciferol, (DRISDOL) 50000 units CAPS capsule, Take 1.25 capsules by mouth once a week., Disp: , Rfl: 3  Review of Systems  Constitutional: Negative for fever.  HENT: Positive for postnasal drip.   Respiratory: Positive for cough.   Gastrointestinal: Positive for diarrhea and flatus. Negative for vomiting.  Allergic/Immunologic: Negative for environmental allergies.  Neurological: Positive for headaches.    Social History   Tobacco Use  . Smoking status: Never Smoker  . Smokeless tobacco: Never Used  Substance Use Topics  . Alcohol use: No    Alcohol/week: 0.0 oz   Objective:   BP 126/68 (BP Location: Right Arm, Patient Position: Sitting, Cuff Size: Large)   Pulse 88   Temp 98.5 F (36.9 C)   Resp 16   Ht 5' 11"  (1.803 m)   Wt (!) 341 lb (154.7 kg)   SpO2 95%   BMI 47.56 kg/m     Physical Exam  General Appearance:    Alert, cooperative, no distress  HENT:   bilateral TM normal without fluid or infection, neck  without nodes, pharynx erythematous without exudate, sinuses tender and nasal mucosa congested  Eyes:    PERRL, conjunctiva/corneas clear, EOM's intact       Lungs:     Clear to auscultation bilaterally, respirations unlabored  Heart:    Regular rate and rhythm  Abd::   Soft, BS positive           Assessment & Plan:     1. Diarrhea, unspecified type  - C difficile Toxins A+B W/Rflx - Fecal lactoferrin, quant - Ova and parasite examination - Stool culture  2. Sinusitis, unspecified chronicity, unspecified location  - azithromycin (ZITHROMAX) 250 MG tablet; 2 by mouth today, then 1 daily for 4 days  Dispense: 6 tablet; Refill: 0  3. Multiple lung nodules on CT  - CT Chest Wo Contrast; Future  4. Solitary pulmonary nodule Due for 12 month follow up in march 2019 - CT Chest Wo Contrast; Future       Lelon Huh, MD  [facility]

## 2018-01-25 NOTE — Patient Instructions (Signed)
Start taking OTC probiotic such as Electronics engineer every day.

## 2018-01-30 DIAGNOSIS — M316 Other giant cell arteritis: Secondary | ICD-10-CM | POA: Diagnosis not present

## 2018-02-01 DIAGNOSIS — R197 Diarrhea, unspecified: Secondary | ICD-10-CM | POA: Diagnosis not present

## 2018-02-04 ENCOUNTER — Ambulatory Visit
Admission: RE | Admit: 2018-02-04 | Discharge: 2018-02-04 | Disposition: A | Discharge status: Home / Self Care | Payer: PPO | Source: Ambulatory Visit | Attending: Family Medicine | Admitting: Family Medicine

## 2018-02-04 DIAGNOSIS — I700 Atherosclerosis of aorta: Secondary | ICD-10-CM | POA: Diagnosis not present

## 2018-02-04 DIAGNOSIS — R911 Solitary pulmonary nodule: Secondary | ICD-10-CM

## 2018-02-04 DIAGNOSIS — I2510 Atherosclerotic heart disease of native coronary artery without angina pectoris: Secondary | ICD-10-CM | POA: Insufficient documentation

## 2018-02-04 DIAGNOSIS — R918 Other nonspecific abnormal finding of lung field: Secondary | ICD-10-CM

## 2018-02-05 LAB — STOOL CULTURE: E COLI SHIGA TOXIN ASSAY: NEGATIVE

## 2018-02-05 LAB — FECAL LACTOFERRIN, QUANT: Lactoferrin, Fecal, Quant.: 30.8 ug/mL(g) — ABNORMAL HIGH (ref 0.00–7.24)

## 2018-02-07 ENCOUNTER — Telehealth: Payer: Self-pay | Admitting: *Deleted

## 2018-02-07 LAB — OVA AND PARASITE EXAMINATION

## 2018-02-07 NOTE — Telephone Encounter (Signed)
Received fax from [employer] [facility] [facility] stating they are unable to add requested lab. Due to stool sample no longer being available.

## 2018-02-07 NOTE — Telephone Encounter (Signed)
Patient was notified of results. Patient stated that he is not currently having any diarrhea. He also stated that he has not seen any blood in his stool. Patient also wanted to remind Dr. Caryn Section that he has a colonoscopy scheduled with Dr. Tiffany Kocher on 02/13/2018. Advised patient to let us know if diarrhea comes back.

## 2018-02-07 NOTE — Telephone Encounter (Signed)
Please advise patient that stool cultures were negative for any specific organism, but there were a large amount of white blood cells in stool consistent with infection. If he is still having any diarrhea I would recommend metronidazole 54m tablets twice daily for 14 days.

## 2018-02-07 NOTE — Telephone Encounter (Signed)
-----   Message from Birdie Sons, MD sent at 02/06/2018  7:57 AM EST ----- Please see if [facility] can do clostridium difficile PCR test for stool sample from last week.

## 2018-02-07 NOTE — Telephone Encounter (Signed)
Notes recorded by Julieta Bellini, CMA on 02/06/2018 at 2:21 PM EST [facility] will fax form letting us know if test can be added or not.

## 2018-02-08 LAB — C DIFFICILE, CYTOTOXIN B

## 2018-02-08 LAB — C DIFFICILE TOXINS A+B W/RFLX: C DIFFICILE TOXINS A+B, EIA: NEGATIVE

## 2018-02-12 ENCOUNTER — Encounter: Payer: Self-pay | Admitting: *Deleted

## 2018-02-12 ENCOUNTER — Ambulatory Visit: Payer: Self-pay | Admitting: Family Medicine

## 2018-02-13 ENCOUNTER — Encounter
Admission: RE | Disposition: A | Discharge status: Home / Self Care | Payer: Self-pay | Source: Ambulatory Visit | Attending: Unknown Physician Specialty

## 2018-02-13 ENCOUNTER — Ambulatory Visit
Admission: RE | Admit: 2018-02-13 | Discharge: 2018-02-13 | Disposition: A | Discharge status: Home / Self Care | Payer: PPO | Source: Ambulatory Visit | Attending: Unknown Physician Specialty | Admitting: Unknown Physician Specialty

## 2018-02-13 ENCOUNTER — Ambulatory Visit: Payer: PPO | Admitting: Anesthesiology

## 2018-02-13 ENCOUNTER — Other Ambulatory Visit: Payer: Self-pay

## 2018-02-13 ENCOUNTER — Encounter: Payer: Self-pay | Admitting: *Deleted

## 2018-02-13 DIAGNOSIS — E669 Obesity, unspecified: Secondary | ICD-10-CM | POA: Diagnosis not present

## 2018-02-13 DIAGNOSIS — K640 First degree hemorrhoids: Secondary | ICD-10-CM | POA: Diagnosis not present

## 2018-02-13 DIAGNOSIS — Z8719 Personal history of other diseases of the digestive system: Secondary | ICD-10-CM | POA: Diagnosis not present

## 2018-02-13 DIAGNOSIS — Z79899 Other long term (current) drug therapy: Secondary | ICD-10-CM | POA: Diagnosis not present

## 2018-02-13 DIAGNOSIS — Z8619 Personal history of other infectious and parasitic diseases: Secondary | ICD-10-CM | POA: Insufficient documentation

## 2018-02-13 DIAGNOSIS — Z1211 Encounter for screening for malignant neoplasm of colon: Secondary | ICD-10-CM | POA: Insufficient documentation

## 2018-02-13 DIAGNOSIS — Z7984 Long term (current) use of oral hypoglycemic drugs: Secondary | ICD-10-CM | POA: Insufficient documentation

## 2018-02-13 DIAGNOSIS — K5289 Other specified noninfective gastroenteritis and colitis: Secondary | ICD-10-CM | POA: Diagnosis not present

## 2018-02-13 DIAGNOSIS — Z6841 Body Mass Index (BMI) 40.0 and over, adult: Secondary | ICD-10-CM | POA: Diagnosis not present

## 2018-02-13 DIAGNOSIS — Z8673 Personal history of transient ischemic attack (TIA), and cerebral infarction without residual deficits: Secondary | ICD-10-CM | POA: Insufficient documentation

## 2018-02-13 DIAGNOSIS — E119 Type 2 diabetes mellitus without complications: Secondary | ICD-10-CM | POA: Diagnosis not present

## 2018-02-13 DIAGNOSIS — K529 Noninfective gastroenteritis and colitis, unspecified: Secondary | ICD-10-CM | POA: Insufficient documentation

## 2018-02-13 DIAGNOSIS — M1990 Unspecified osteoarthritis, unspecified site: Secondary | ICD-10-CM | POA: Diagnosis not present

## 2018-02-13 DIAGNOSIS — Z8546 Personal history of malignant neoplasm of prostate: Secondary | ICD-10-CM | POA: Insufficient documentation

## 2018-02-13 DIAGNOSIS — Z800 Family history of malignant neoplasm of digestive organs: Secondary | ICD-10-CM | POA: Diagnosis not present

## 2018-02-13 DIAGNOSIS — I10 Essential (primary) hypertension: Secondary | ICD-10-CM | POA: Insufficient documentation

## 2018-02-13 DIAGNOSIS — F329 Major depressive disorder, single episode, unspecified: Secondary | ICD-10-CM | POA: Diagnosis not present

## 2018-02-13 DIAGNOSIS — Z8249 Family history of ischemic heart disease and other diseases of the circulatory system: Secondary | ICD-10-CM | POA: Diagnosis not present

## 2018-02-13 DIAGNOSIS — Z923 Personal history of irradiation: Secondary | ICD-10-CM | POA: Insufficient documentation

## 2018-02-13 DIAGNOSIS — G4733 Obstructive sleep apnea (adult) (pediatric): Secondary | ICD-10-CM | POA: Diagnosis not present

## 2018-02-13 HISTORY — DX: Cerebral infarction, unspecified: I63.9

## 2018-02-13 HISTORY — DX: Unspecified osteoarthritis, unspecified site: M19.90

## 2018-02-13 HISTORY — PX: COLONOSCOPY WITH PROPOFOL: SHX5780

## 2018-02-13 LAB — GLUCOSE, CAPILLARY: GLUCOSE-CAPILLARY: 181 mg/dL — AB (ref 65–99)

## 2018-02-13 SURGERY — COLONOSCOPY WITH PROPOFOL
Anesthesia: General

## 2018-02-13 MED ORDER — LIDOCAINE HCL (PF) 2 % IJ SOLN
INTRAMUSCULAR | Status: AC
  Filled 2018-02-13: qty 10

## 2018-02-13 MED ORDER — FENTANYL CITRATE (PF) 100 MCG/2ML IJ SOLN
INTRAMUSCULAR | Status: AC
  Filled 2018-02-13: qty 2

## 2018-02-13 MED ORDER — PROPOFOL 500 MG/50ML IV EMUL
INTRAVENOUS | Status: AC
  Filled 2018-02-13: qty 50

## 2018-02-13 MED ORDER — PROPOFOL 10 MG/ML IV BOLUS
INTRAVENOUS | Status: AC
Start: 2018-02-13 — End: 2018-02-13
  Filled 2018-02-13: qty 20

## 2018-02-13 MED ORDER — SODIUM CHLORIDE 0.9 % IV SOLN
INTRAVENOUS | Status: DC
  Administered 2018-02-13: 08:00:00 via INTRAVENOUS

## 2018-02-13 MED ORDER — PROPOFOL 500 MG/50ML IV EMUL
INTRAVENOUS | Status: DC | PRN
  Administered 2018-02-13: 180 ug/kg/min via INTRAVENOUS

## 2018-02-13 MED ORDER — FENTANYL CITRATE (PF) 100 MCG/2ML IJ SOLN
INTRAMUSCULAR | Status: DC | PRN
  Administered 2018-02-13 (×2): 50 ug via INTRAVENOUS

## 2018-02-13 MED ORDER — SODIUM CHLORIDE 0.9 % IV SOLN: INTRAVENOUS | Status: DC

## 2018-02-13 MED ORDER — PROPOFOL 10 MG/ML IV BOLUS
INTRAVENOUS | Status: DC | PRN
  Administered 2018-02-13: 100 mg via INTRAVENOUS

## 2018-02-13 MED ORDER — LIDOCAINE 2% (20 MG/ML) 5 ML SYRINGE
INTRAMUSCULAR | Status: DC | PRN
  Administered 2018-02-13: 40 mg via INTRAVENOUS

## 2018-02-13 NOTE — Op Note (Signed)
[facility] Patient Name: Edward King Procedure Date: 02/13/2018 8:00 AM MRN: [id number] Account #: [id number] Date of Birth: September 22, 1951 Admit Type: Outpatient Age: 67 Room: [facility] ENDO ROOM 3 Gender: Male Note Status: Finalized Procedure:            Colonoscopy Indications:          Screening in patient at increased risk: Family history                        of 1st-degree relative with colorectal cancer Providers:            Manya Silvas, MD Referring MD:         Kirstie Peri. Caryn Section, MD (Referring MD) Medicines:            Propofol per Anesthesia Complications:        No immediate complications. Procedure:            Pre-Anesthesia Assessment:                       - After reviewing the risks and benefits, the patient                        was deemed in satisfactory condition to undergo the                        procedure.                       After obtaining informed consent, the colonoscope was                        passed under direct vision. Throughout the procedure,                        the patient's blood pressure, pulse, and oxygen                        saturations were monitored continuously. The                        Colonoscope was introduced through the anus and                        advanced to the the cecum, identified by appendiceal                        orifice and ileocecal valve. The colonoscopy was                        somewhat difficult. The patient tolerated the procedure                        well. The quality of the bowel preparation was good. Findings:      The right colon had scattered swollen erythematous coarse splotches       which looked like Crohn's disease but can't rule out sessile unusual       polyps. Biopsies done from cecum and ascending colon areas. A few       similar areas seen in hepatic flexure and proximal transverse colon. The       prep was  not complete and made some visualizations less  than optimal. No       obvious areas distal to splenic flexure seen.      Internal hemorrhoids were found during endoscopy. The hemorrhoids were       small and Grade I (internal hemorrhoids that do not prolapse). Impression:           - Internal hemorrhoids.                       - No specimens collected. Recommendation:       - Await pathology results. This appears to be Crohn's                        disease and will likely need treatment for this. His                        daughter also has Crohn's. Manya Silvas, MD 02/13/2018 8:44:58 AM This report has been signed electronically. Number of Addenda: 0 Note Initiated On: 02/13/2018 8:00 AM Scope Withdrawal Time: 0 hours 13 minutes 50 seconds  Total Procedure Duration: 0 hours 20 minutes 24 seconds       [facility]

## 2018-02-13 NOTE — Anesthesia Postprocedure Evaluation (Signed)
Anesthesia Post Note  Patient: Edward King  Procedure(s) Performed: COLONOSCOPY WITH PROPOFOL (N/A )  Patient location during evaluation: Endoscopy Anesthesia Type: General Level of consciousness: awake and alert and oriented Pain management: pain level controlled Vital Signs Assessment: post-procedure vital signs reviewed and stable Respiratory status: spontaneous breathing, nonlabored ventilation and respiratory function stable Cardiovascular status: blood pressure returned to baseline and stable Postop Assessment: no signs of nausea or vomiting Anesthetic complications: no     Last Vitals:  Vitals:   02/13/18 0842 02/13/18 0858  BP: (!) 122/91 138/82  Pulse: 90   Resp: 12   Temp: 36.8 C   SpO2: 95%     Last Pain:  Vitals:   02/13/18 0838  TempSrc: Tympanic                 Talana Slatten

## 2018-02-13 NOTE — Anesthesia Post-op Follow-up Note (Signed)
Anesthesia QCDR form completed.        

## 2018-02-13 NOTE — H&P (Signed)
Primary Care Physician:  Birdie Sons, MD Primary Gastroenterologist:  Dr. Vira Agar Pre-Procedure History & Physical: HPI:  Edward King is a 67 y.o. male is here for an colonoscopy for family history of colon cancer in his mother and follow up right sided colitis   Past Medical History:  Diagnosis Date  . Arthritis   . Depression   . Diabetes mellitus without complication ([place])   . 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 History of chicken pox   . History of measles   . History of mumps   . Hypertension   . Lymph edema    both legs.  mostly left  . Neuromuscular disorder ([place])   . Prostate CA ([place]) 2016   Radiation seed implants.  . Stroke ([place])   . Temporal arteritis (HCC)    Clinically dx Dr. [facility]. Negative temporal artrtery bx    Past Surgical History:  Procedure Laterality Date  . ABDOMINOPLASTY    . Carotid Doppler Ultrasound  08/22/2014   [facility]; Minimal plaque right. Minimal thisckening left Anterograde flow vertebrals  . CT of the head  08/22/2014   [facility]. Normal  . DOPPLER ECHOCARDIOGRAPHY  08/29/2014   EF=60-65%. Normal LVEF  . Myocardial Perfusion Scan  09/10/2012   Poor exercsie tolerance, but no evidence of stress induced myocardial ischemia. EF=66%. Dr. Clayborn Bigness  . NASAL SEPTOPLASTY W/ TURBINOPLASTY Bilateral 02/10/2016   Procedure: NASAL SEPTOPLASTY WITH INFERIOR TURBINATE REDUCTION;  Surgeon: Carloyn Manner, MD;  Location: [facility] ORS;  Service: ENT;  Laterality: Bilateral;  . pannectomy    . TEMPORAL ARTERY BIOPSY / LIGATION Left     Prior to Admission medications   Medication Sig Start Date End Date Taking? Authorizing Provider  buPROPion (WELLBUTRIN SR) 150 MG 12 hr tablet Take 150 mg by mouth daily.   Yes [provider]  Cholecalciferol (VITAMIN D3) 5000 units CAPS Take 5,000 Units by mouth daily.   Yes [provider]  citalopram (CELEXA) 40 MG tablet Take 1 tablet by mouth daily. 04/05/16  Yes [provider]   Coenzyme Q10 (COQ-10) 100 MG CAPS Take 100 mg by mouth daily.   Yes [provider]  Elastic Bandages & Supports ([place]) [place] by Does not apply route.   Yes [provider]  folic acid (FOLVITE) 1 MG tablet Take 1 tablet by mouth daily. 12/30/15  Yes [provider]  gabapentin (NEURONTIN) 300 MG capsule TAKE 2 CAPSULES BY MOUTH IN THE MORNING AND 3 CAPSULES AT BEDTIME 07/11/17  Yes Birdie Sons, MD  glipiZIDE (GLUCOTROL XL) 2.5 MG 24 hr tablet Take 1 tablet (2.5 mg total) by mouth daily. 11/12/17  Yes Birdie Sons, MD  metFORMIN (GLUCOPHAGE-XR) 500 MG 24 hr tablet TAKE 2 TABLETS (1,000 MG TOTAL) BY MOUTH 2 (TWO) TIMES DAILY. 11/28/17  Yes Birdie Sons, MD  methotrexate (RHEUMATREX) 2.5 MG tablet Take 4 tablets by mouth every 7 (seven) days.  09/05/16  Yes [provider]  mirtazapine (REMERON) 15 MG tablet Take 0.5 tablets by mouth at bedtime.    Yes [provider]  predniSONE (DELTASONE) 5 MG tablet Take 0.5 tablets by mouth every other day.  03/03/16  Yes [provider]  valsartan-hydrochlorothiazide (DIOVAN-HCT) 80-12.5 MG tablet TAKE 1 TABLET BY MOUTH EVERY DAY 05/25/17  Yes Birdie Sons, MD  Vitamin D, Ergocalciferol, (DRISDOL) 50000 units CAPS capsule Take 1.25 capsules by mouth once a week. 02/10/17  Yes [provider]  ALPRAZolam Duanne Moron)  0.5 MG tablet Take 0.5 mg by mouth at bedtime as needed for anxiety.    [provider]  pravastatin (PRAVACHOL) 40 MG tablet Take 1 tablet (40 mg total) by mouth daily. 02/15/17   Birdie Sons, MD    Allergies as of 12/21/2017  . (No Known Allergies)    Family History  Problem Relation Age of Onset  . Colon cancer Mother   . Coronary artery disease Mother   . Prostate cancer Father   . Kidney disease Father   . Dementia Brother   . Diabetes Neg Hx     Social History   Socioeconomic History  . Marital status: Married    Spouse  name: Not on file  . Number of children: Not on file  . Years of education: Not on file  . Highest education level: Not on file  Social Needs  . Financial resource strain: Not on file  . Food insecurity - worry: Not on file  . Food insecurity - inability: Not on file  . Transportation needs - medical: Not on file  . Transportation needs - non-medical: Not on file  Occupational History  . 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 Smoking status: Never Smoker  . Smokeless tobacco: Never Used  Substance and Sexual Activity  . Alcohol use: No    Alcohol/week: 0.0 oz  . Drug use: No  . Sexual activity: Not on file  Other Topics Concern  . Not on file  Social History Narrative  . Not on file    Review of Systems: See HPI, otherwise negative ROS  Physical Exam: BP (!) 165/85   Pulse 87   Temp (!) 96.9 F (36.1 C) (Tympanic)   Resp 17   Ht 5' 11"  (1.803 m)   Wt (!) 158.8 kg (350 lb)   SpO2 95%   BMI 48.82 kg/m  General:   Alert,  pleasant and cooperative in NAD Head:  Normocephalic and atraumatic. Neck:  Supple; no masses or thyromegaly. Lungs:  Clear throughout to auscultation.    Heart:  Regular rate and rhythm. Abdomen:  Soft, nontender and nondistended. Normal bowel sounds, without guarding, and without rebound.   Neurologic:  Alert and  oriented x4;  grossly normal neurologically.  Impression/Plan: Edward King is here for an colonoscopy to be performed for FH colon cancer in mother and right side  Colitis.  Risks, benefits, limitations, and alternatives regarding  colonoscopy have been reviewed with the patient.  Questions have been answered.  All parties agreeable.   Gaylyn Cheers, MD  02/13/2018, 8:04 AM

## 2018-02-13 NOTE — Anesthesia Preprocedure Evaluation (Signed)
Anesthesia Evaluation  Patient identified by MRN, date of birth, ID band Patient awake    Reviewed: Allergy & Precautions, NPO status , Patient's Chart, lab work & pertinent test results  History of Anesthesia Complications Negative for: history of anesthetic complications  Airway Mallampati: III  TM Distance: >3 FB Neck ROM: Full    Dental no notable dental hx.    Pulmonary Sleep apnea: pt denies. , neg COPD,    breath sounds clear to auscultation- rhonchi (-) wheezing      Cardiovascular hypertension, Pt. on medications (-) CAD, (-) Past MI, (-) Cardiac Stents and (-) CABG  Rhythm:Regular Rate:Normal - Systolic murmurs and - Diastolic murmurs    Neuro/Psych PSYCHIATRIC DISORDERS Depression    GI/Hepatic negative GI ROS, Neg liver ROS,   Endo/Other  diabetes, Oral Hypoglycemic Agents  Renal/GU negative Renal ROS     Musculoskeletal  (+) Arthritis ,   Abdominal (+) + obese,   Peds  Hematology negative hematology ROS (+)   Anesthesia Other Findings Past Medical History: No date: Arthritis No date: Depression No date: Diabetes mellitus without complication ([place]) 02/24/4075: Giant cell arteritis (HCC) No date: History of chicken pox No date: History of measles No date: History of mumps No date: Hypertension No date: Lymph edema     Comment:  both legs.  mostly left No date: Neuromuscular disorder ([place]) 2016: Prostate CA ([place])     Comment:  Radiation seed implants. No date: Stroke [facility]) No date: Temporal arteritis ([place])     Comment:  Clinically dx Dr. [facility]. Negative temporal artrtery               bx   Reproductive/Obstetrics                             Anesthesia Physical Anesthesia Plan  ASA: III  Anesthesia Plan: General   Post-op Pain Management:    Induction: Intravenous  PONV Risk Score and Plan: 1 and Propofol infusion  Airway Management Planned: Natural  Airway  Additional Equipment:   Intra-op Plan:   Post-operative Plan:   Informed Consent: I have reviewed the patients History and Physical, chart, labs and discussed the procedure including the risks, benefits and alternatives for the proposed anesthesia with the patient or authorized representative who has indicated his/her understanding and acceptance.   Dental advisory given  Plan Discussed with: CRNA and Anesthesiologist  Anesthesia Plan Comments:         Anesthesia Quick Evaluation

## 2018-02-13 NOTE — Transfer of Care (Signed)
Immediate Anesthesia Transfer of Care Note  Patient: Edward King  Procedure(s) Performed: COLONOSCOPY WITH PROPOFOL (N/A )  Patient Location: PACU and Endoscopy Unit  Anesthesia Type:General  Level of Consciousness: awake and patient cooperative  Airway & Oxygen Therapy: Patient Spontanous Breathing  Post-op Assessment: Report given to RN and Post -op Vital signs reviewed and stable  Post vital signs: Reviewed and stable  Last Vitals:  Vitals:   02/13/18 0743 02/13/18 0745  BP: (!) 165/85 (!) 165/85  Pulse: 87 87  Resp: 18 17  Temp: (!) 36.1 C (!) 36.1 C  SpO2: 95% 95%    Last Pain:  Vitals:   02/13/18 0745  TempSrc: Tympanic      Patients Stated Pain Goal: 6 (74/71/59 [id number])  Complications: No apparent anesthesia complications

## 2018-02-14 ENCOUNTER — Encounter: Payer: Self-pay | Admitting: Unknown Physician Specialty

## 2018-02-14 LAB — SURGICAL PATHOLOGY

## 2018-02-21 DIAGNOSIS — K523 Indeterminate colitis: Secondary | ICD-10-CM | POA: Diagnosis not present

## 2018-02-25 ENCOUNTER — Other Ambulatory Visit: Payer: Self-pay | Admitting: Family Medicine

## 2018-02-26 DIAGNOSIS — S90122A Contusion of left lesser toe(s) without damage to nail, initial encounter: Secondary | ICD-10-CM | POA: Diagnosis not present

## 2018-02-26 DIAGNOSIS — S9032XA Contusion of left foot, initial encounter: Secondary | ICD-10-CM | POA: Diagnosis not present

## 2018-02-26 DIAGNOSIS — B351 Tinea unguium: Secondary | ICD-10-CM | POA: Diagnosis not present

## 2018-02-26 DIAGNOSIS — E1159 Type 2 diabetes mellitus with other circulatory complications: Secondary | ICD-10-CM | POA: Diagnosis not present

## 2018-03-15 ENCOUNTER — Encounter: Payer: Self-pay | Admitting: Family Medicine

## 2018-03-15 DIAGNOSIS — K529 Noninfective gastroenteritis and colitis, unspecified: Secondary | ICD-10-CM | POA: Insufficient documentation

## 2018-04-01 ENCOUNTER — Encounter: Payer: Self-pay | Admitting: Family Medicine

## 2018-04-01 ENCOUNTER — Ambulatory Visit (INDEPENDENT_AMBULATORY_CARE_PROVIDER_SITE_OTHER): Payer: PPO | Admitting: Family Medicine

## 2018-04-01 VITALS — BP 122/82 | HR 88 | Temp 98.9°F | Resp 18 | Wt 335.0 lb

## 2018-04-01 DIAGNOSIS — R059 Cough, unspecified: Secondary | ICD-10-CM

## 2018-04-01 DIAGNOSIS — R05 Cough: Secondary | ICD-10-CM | POA: Diagnosis not present

## 2018-04-01 MED ORDER — FLUTICASONE-UMECLIDIN-VILANT 100-62.5-25 MCG/INH IN AEPB: 1.0000 | INHALATION_SPRAY | Freq: Every day | RESPIRATORY_TRACT | 0 refills | Status: DC

## 2018-04-01 MED ORDER — DOXYCYCLINE HYCLATE 100 MG PO TABS: 100.0000 mg | ORAL_TABLET | Freq: Two times a day (BID) | ORAL | 0 refills | Status: DC

## 2018-04-01 NOTE — Patient Instructions (Signed)
   Fill prescription for doxycycline if cough is not much better after using inhaler for 3 days.

## 2018-04-01 NOTE — Progress Notes (Signed)
Patient: Edward King Male    DOB: 10/25/1951   67 y.o.   MRN: [id number] Visit Date: 04/01/2018  Today's Provider: Lelon Huh, MD   Chief Complaint  Patient presents with  . Cough   Subjective:    Cough  This is a recurrent problem. Episode onset: 2 months ago. The problem has been gradually worsening. The cough is productive of sputum (clear colored sputum). Associated symptoms include ear pain (right ear), nasal congestion, postnasal drip, rhinorrhea and a sore throat (resolved). Pertinent negatives include no chest pain, chills, ear congestion, fever, headaches, shortness of breath or wheezing. Treatments tried: Mucinex. The treatment provided mild relief.  Patient was last seen 2 months ago for Sinusitis. He was treated with Azithromycin, Patient reports good compliance with treatment, good tolerance and fair symptom control. Patient states the cough returned a couple of weeks ago and has worsened. Taking OTC Mucinex for chest without any relief. Tried an allergy pill which didn't help. Is using Neti-Pot     No Known Allergies   Current Outpatient Medications:  .  buPROPion (WELLBUTRIN SR) 150 MG 12 hr tablet, Take 150 mg by mouth daily., Disp: , Rfl:  .  Cholecalciferol (VITAMIN D3) 5000 units CAPS, Take 5,000 Units by mouth daily., Disp: , Rfl:  .  citalopram (CELEXA) 40 MG tablet, Take 1 tablet by mouth daily., Disp: , Rfl:  .  Coenzyme Q10 (COQ-10) 100 MG CAPS, Take 100 mg by mouth daily., Disp: , Rfl:  .  Elastic Bandages & Supports ([place]) MISC, by Does not apply route., Disp: , Rfl:  .  folic acid (FOLVITE) 1 MG tablet, Take 1 tablet by mouth daily., Disp: , Rfl:  .  gabapentin (NEURONTIN) 300 MG capsule, TAKE 2 CAPSULES BY MOUTH IN THE MORNING AND 3 CAPSULES AT BEDTIME, Disp: 150 capsule, Rfl: 5 .  glipiZIDE (GLUCOTROL XL) 2.5 MG 24 hr tablet, Take 1 tablet (2.5 mg total) by mouth daily., Disp: 90 tablet, Rfl: 4 .  mesalamine (LIALDA)  1.2 g EC tablet, Take 4 tablets by mouth daily., Disp: , Rfl:  .  metFORMIN (GLUCOPHAGE-XR) 500 MG 24 hr tablet, TAKE 2 TABLETS (1,000 MG TOTAL) BY MOUTH 2 (TWO) TIMES DAILY., Disp: 360 tablet, Rfl: 4 .  mirtazapine (REMERON) 15 MG tablet, Take 0.5 tablets by mouth at bedtime. , Disp: , Rfl:  .  pravastatin (PRAVACHOL) 40 MG tablet, TAKE 1 TABLET (40 MG TOTAL) BY MOUTH DAILY., Disp: 30 tablet, Rfl: 12 .  predniSONE (DELTASONE) 5 MG tablet, Take 0.5 tablets by mouth every other day. , Disp: , Rfl:  .  valsartan-hydrochlorothiazide (DIOVAN-HCT) 80-12.5 MG tablet, TAKE 1 TABLET BY MOUTH EVERY DAY, Disp: 90 tablet, Rfl: 4 .  Vitamin D, Ergocalciferol, (DRISDOL) 50000 units CAPS capsule, Take 1.25 capsules by mouth once a week., Disp: , Rfl: 3 .  ALPRAZolam (XANAX) 0.5 MG tablet, Take 0.5 mg by mouth at bedtime as needed for anxiety., Disp: , Rfl:   Review of Systems  Constitutional: Negative for appetite change, chills and fever.  HENT: Positive for congestion (worse at night), ear pain (right ear), postnasal drip, rhinorrhea, sinus pressure, sinus pain (resolved) and sore throat (resolved).   Respiratory: Positive for cough. Negative for chest tightness, shortness of breath and wheezing.   Cardiovascular: Negative for chest pain and palpitations.  Gastrointestinal: Negative for abdominal pain, nausea and vomiting.  Neurological: Negative for headaches.    Social History   Tobacco Use  .  Smoking status: Never Smoker  . Smokeless tobacco: Never Used  Substance Use Topics  . Alcohol use: No    Alcohol/week: 0.0 oz   Objective:   BP 122/82 (BP Location: Left Arm, Patient Position: Sitting, Cuff Size: Large)   Pulse 88   Temp 98.9 F (37.2 C) (Oral)   Resp 18   Wt (!) 335 lb (152 kg)   SpO2 94% Comment: room air  BMI 46.72 kg/m  There were no vitals filed for this visit.   Physical Exam  General Appearance:    Alert, cooperative, no distress  HENT:   ENT exam normal, no neck nodes  or sinus tenderness, throat normal without erythema or exudate and nasal mucosa pale and congested  Eyes:    PERRL, conjunctiva/corneas clear, EOM's intact       Lungs:     Few scattered expiratory wheezes, no focal rales or rhonchi. respirations unlabored  Heart:    Regular rate and rhythm  Neurologic:   Awake, alert, oriented x 3. No apparent focal neurological           defect.           Assessment & Plan:     1. Cough Likely some RAD exacerbated by allergies. Given month supply of Trelegy samples and prescription for doxy to fill if not greatly improved within 3 days of starting Trelegy.        Lelon Huh, MD  [facility]

## 2018-04-03 DIAGNOSIS — M316 Other giant cell arteritis: Secondary | ICD-10-CM | POA: Diagnosis not present

## 2018-04-24 ENCOUNTER — Other Ambulatory Visit: Payer: Self-pay | Admitting: Family Medicine

## 2018-04-25 DIAGNOSIS — K5180 Other ulcerative colitis without complications: Secondary | ICD-10-CM | POA: Diagnosis not present

## 2018-05-03 DIAGNOSIS — M316 Other giant cell arteritis: Secondary | ICD-10-CM | POA: Diagnosis not present

## 2018-05-21 NOTE — Progress Notes (Signed)
Patient: Edward King, Male    DOB: 04-09-1951, 67 y.o.   MRN: [id number] Visit Date: 05/22/2018  Today's Provider: Lelon Huh, MD   Chief Complaint  Patient presents with  . Annual Exam  . Diabetes  . Hypertension  . Sleep Apnea   Subjective:     Complete Physical Edward King is a 67 y.o. male. He feels fairly well. He reports no regular  exercising. He reports he is sleeping fairly well.  -----------------------------------------------------------   Diabetes Mellitus Type II, Follow-up:   Lab Results  Component Value Date   HGBA1C 6.8 11/12/2017   HGBA1C 6.4 (H) 05/11/2017   HGBA1C 6.1 02/14/2017   Last seen for diabetes 7 months ago.  Management since then includes; advised to work on improving diet and increasing exercise. He reports good compliance with treatment. He is not having side effects.  Current symptoms include none and have been stable. Home blood sugar records: have not been checking blood sugars at home  Episodes of hypoglycemia? no   Current Insulin Regimen: none Most Recent Eye Exam: 1 year ago Weight trend: fluctuating a bit Prior visit with dietician: no Current diet: in general, an "unhealthy" diet Current exercise: none  ------------------------------------------------------------------------   Hypertension, follow-up:  BP Readings from Last 3 Encounters:  05/22/18 136/76  04/01/18 122/82  02/13/18 138/82    He was last seen for hypertension 7 months ago.  BP at that visit was 110/74. Management since that visit includes; no changes.He reports good compliance with treatment. He is not having side effects.  He is not exercising. He is adherent to low salt diet.   Outside blood pressures are checked occasionally. He is experiencing none.  Patient denies chest pain, chest pressure/discomfort, claudication, dyspnea, exertional chest pressure/discomfort, fatigue, irregular heart beat, lower extremity edema,  near-syncope, orthopnea, palpitations, paroxysmal nocturnal dyspnea, syncope and tachypnea.   Cardiovascular risk factors include advanced age (older than 33 for men, 3 for women), diabetes mellitus, dyslipidemia, hypertension, male gender and sedentary lifestyle.  Use of agents associated with hypertension: none.   ------------------------------------------------------------------------   Obstructive sleep apnea From 05/11/2017-recommended new sleep study if labs are normal. patient wanted to hold off on sleep study. Patient reports he is not currently using a CPAP machine.    Clinical Support from 05/22/2018 in [facility]  PHQ-2 Total Score  2       Review of Systems  Constitutional: Negative for appetite change, chills, fatigue and fever.  HENT: Negative for congestion, ear pain, hearing loss, nosebleeds and trouble swallowing.   Eyes: Negative for pain and visual disturbance.  Respiratory: Negative for cough, chest tightness and shortness of breath.   Cardiovascular: Negative for chest pain, palpitations and leg swelling.  Gastrointestinal: Negative for abdominal pain, blood in stool, constipation, diarrhea, nausea and vomiting.  Endocrine: Negative for polydipsia, polyphagia and polyuria.  Genitourinary: Negative for dysuria and flank pain.  Musculoskeletal: Negative for arthralgias, back pain, joint swelling, myalgias and neck stiffness.  Skin: Negative for color change, rash and wound.  Neurological: Negative for dizziness, tremors, seizures, speech difficulty, weakness, light-headedness and headaches.  Psychiatric/Behavioral: Negative for behavioral problems, confusion, decreased concentration, dysphoric mood and sleep disturbance. The patient is not nervous/anxious.   All other systems reviewed and are negative.   Social History   Socioeconomic History  . Marital status: Married    Spouse name: Not on file  . Number of children: 1  . Years of education:  Not  on file  . Highest education level: Some college, no degree  Occupational History  . 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 Financial resource strain: Not hard at all  . Food insecurity:    Worry: Never true    Inability: Never true  . Transportation needs:    Medical: No    Non-medical: No  Tobacco Use  . Smoking status: Never Smoker  . Smokeless tobacco: Never Used  Substance and Sexual Activity  . Alcohol use: Yes    Alcohol/week: 0.0 oz    Comment: Rare - 1 drink  . Drug use: No  . Sexual activity: Not on file  Lifestyle  . Physical activity:    Days per week: Not on file    Minutes per session: Not on file  . Stress: Not at all  Relationships  . Social connections:    Talks on phone: Not on file    Gets together: Not on file    Attends religious service: Not on file    Active member of club or organization: Not on file    Attends meetings of clubs or organizations: Not on file    Relationship status: Not on file  . Intimate partner violence:    Fear of current or ex partner: Not on file    Emotionally abused: Not on file    Physically abused: Not on file    Forced sexual activity: Not on file  Other Topics Concern  . Not on file  Social History Narrative  . Not on file    Past Medical History:  Diagnosis Date  . Arthritis   . Colitis   . Depression   . Diabetes mellitus without complication ([place])   . 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 History of chicken pox   . History of measles   . History of mumps   . Hypertension   . Lymph edema    both legs.  mostly left  . Neuromuscular disorder ([place])   . Prostate CA ([place]) 2016   Radiation seed implants.  . Stroke ([place])   . Temporal arteritis ([facility])    Clinically dx Dr. Jefm Bryant. Negative temporal artrtery bx     Patient Active Problem List   Diagnosis Date Noted  . Chronic colitis 03/15/2018  . Hypersomnia 05/11/2017  .  Fatigue 05/11/2017  . Multiple lung nodules on CT 02/29/2016  . 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 Hypertension 06/16/2015  . CA of prostate ([place]) 06/16/2015  . Obstructive sleep apnea 06/16/2015  . Diabetes mellitus with neurological manifestation ([place]) 05/06/2014  . Cough 02/28/2013  . Peripheral edema 02/28/2013    Past Surgical History:  Procedure Laterality Date  . ABDOMINOPLASTY    . Carotid Doppler Ultrasound  08/22/2014   [facility]; Minimal plaque right. Minimal thisckening left Anterograde flow vertebrals  . COLONOSCOPY WITH PROPOFOL N/A 02/13/2018   Procedure: COLONOSCOPY WITH PROPOFOL;  Surgeon: Manya Silvas, MD;  Location: [facility] ENDOSCOPY;  Service: Endoscopy;  Laterality: N/A;  . CT of the head  08/22/2014   [facility]. Normal  . DOPPLER ECHOCARDIOGRAPHY  08/29/2014   EF=60-65%. Normal LVEF  . Myocardial Perfusion Scan  09/10/2012   Poor exercsie tolerance, but no evidence of stress induced myocardial ischemia. EF=66%. Dr. Clayborn Bigness  . NASAL SEPTOPLASTY W/ TURBINOPLASTY Bilateral 02/10/2016   Procedure: NASAL SEPTOPLASTY WITH INFERIOR TURBINATE REDUCTION;  Surgeon: Carloyn Manner, MD;  Location: [facility] ORS;  Service: ENT;  Laterality: Bilateral;  . pannectomy    . TEMPORAL ARTERY BIOPSY / LIGATION Left     His family history includes Colon cancer in his mother; Coronary artery disease in his mother; Dementia in his brother; Kidney disease in his father; Prostate cancer in his father. There is no history of Diabetes.      Current Outpatient Medications:  .  ALPRAZolam (XANAX) 0.5 MG tablet, Take 0.5 mg by mouth at bedtime as needed for anxiety., Disp: , Rfl:  .  buPROPion (WELLBUTRIN SR) 150 MG 12 hr tablet, Take 150 mg by mouth daily., Disp: , Rfl:  .  Cholecalciferol (VITAMIN D3) 5000 units CAPS, Take 1,000 Units by mouth daily. , Disp: , Rfl:  .  citalopram (CELEXA) 40 MG tablet, Take 1 tablet by mouth daily., Disp: , Rfl:  .  Coenzyme Q10 (COQ-10) 100 MG CAPS, Take 100 mg  by mouth daily., Disp: , Rfl:  .  Elastic Bandages & Supports ([place]) MISC, by Does not apply route., Disp: , Rfl:  .  folic acid (FOLVITE) 1 MG tablet, Take 1 tablet by mouth daily., Disp: , Rfl:  .  gabapentin (NEURONTIN) 300 MG capsule, TAKE 2 CAPSULES BY MOUTH IN THE MORNING AND 3 CAPSULES AT BEDTIME, Disp: 150 capsule, Rfl: 5 .  glipiZIDE (GLUCOTROL XL) 2.5 MG 24 hr tablet, Take 1 tablet (2.5 mg total) by mouth daily., Disp: 90 tablet, Rfl: 4 .  mesalamine (LIALDA) 1.2 g EC tablet, Take 4 tablets by mouth daily., Disp: , Rfl:  .  metFORMIN (GLUCOPHAGE-XR) 500 MG 24 hr tablet, TAKE 2 TABLETS (1,000 MG TOTAL) BY MOUTH 2 (TWO) TIMES DAILY., Disp: 360 tablet, Rfl: 4 .  mirtazapine (REMERON) 15 MG tablet, Take 0.5 tablets by mouth at bedtime. , Disp: , Rfl:  .  pravastatin (PRAVACHOL) 40 MG tablet, TAKE 1 TABLET (40 MG TOTAL) BY MOUTH DAILY., Disp: 30 tablet, Rfl: 12 .  predniSONE (DELTASONE) 5 MG tablet, Take 5 mg by mouth every other day. , Disp: , Rfl:  .  valsartan-hydrochlorothiazide (DIOVAN-HCT) 80-12.5 MG tablet, TAKE 1 TABLET BY MOUTH EVERY DAY, Disp: 90 tablet, Rfl: 4 .  Vitamin D, Ergocalciferol, (DRISDOL) 50000 units CAPS capsule, Take 1.25 capsules by mouth once a week., Disp: , Rfl: 3  Patient Care Team: Birdie Sons, MD as PCP - General (Family Medicine) Yolonda Kida, MD as Consulting Physician (Cardiology) Manya Silvas, MD (Gastroenterology) Emmaline Kluver., MD as Consulting Physician (Rheumatology) Royston Cowper, MD as Consulting Physician (Urology) Lew Dawes, MD as Attending Physician (Psychiatry) Eulogio Bear, MD as Consulting Physician (Ophthalmology)     Objective:   Vitals:  Vitals:   BP 136/76 (BP Location: Right Arm)   Pulse 84   Temp 99.1 F (37.3 C) (Oral)   Ht 5' 11"  (1.803 m)   Wt 339 lb 3.2 oz (153.9 kg)    BMI 47.31 kg/m   BSA 2.78 m          Physical Exam   General Appearance:     Alert, cooperative, no distress, appears stated age  Head:    Normocephalic, without obvious abnormality, atraumatic  Eyes:    PERRL, conjunctiva/corneas clear, EOM's intact, fundi    benign, both eyes       Ears:    Normal TM's and external ear canals, both ears  Nose:   Nares normal, septum midline, mucosa normal, no drainage   or sinus tenderness  Throat:   Lips, mucosa, and tongue normal; teeth and gums normal  Neck:   Supple, symmetrical, trachea midline, no adenopathy;       thyroid:  No enlargement/tenderness/nodules; no carotid   bruit or JVD  Back:     Symmetric, no curvature, ROM normal, no CVA tenderness  Lungs:     Clear to auscultation bilaterally, respirations unlabored  Chest wall:    No tenderness or deformity  Heart:    Regular rate and rhythm, S1 and S2 normal, no murmur, rub   or gallop  Abdomen:     Soft, non-tender, bowel sounds active all four quadrants,    no masses, no organomegaly  Genitalia:    deferred  Rectal:    deferred  Extremities:   Extremities normal, atraumatic, no cyanosis or edema  Pulses:   2+ and symmetric all extremities  Skin:   Skin color, texture, turgor normal, no rashes or lesions  Lymph nodes:   Cervical, supraclavicular, and axillary nodes normal  Neurologic:   CNII-XII intact. Normal strength, sensation and reflexes      throughout    Activities of Daily Living In your present state of health, do you have any difficulty performing the following activities: 05/22/2018  Hearing? Y  Comment Wears bilateral hearing aids.   Vision? N  Difficulty concentrating or making decisions? N  Walking or climbing stairs? N  Dressing or bathing? N  Doing errands, shopping? N  Preparing Food and eating ? N  Using the Toilet? N  In the past six months, have you accidently leaked urine? N  Do you have problems with loss of bowel control? N  Managing your Medications? N  Managing your Finances? N  Housekeeping or managing your Housekeeping? N  Some  recent data might be hidden    Fall Risk Assessment Fall Risk  05/22/2018 05/09/2017 02/22/2017 02/01/2017 12/28/2016  Falls in the past year? No No No - No  Number falls in past yr: - - - - -  Injury with Fall? - - - - -  Risk for fall due to : - - - (No Data) -  Risk for fall due to: Comment - - - no falls since previous visit -  Follow up - - - - -     Depression Screen PHQ 2/9 Scores 05/22/2018 05/09/2017 11/27/2016 10/13/2016  PHQ - 2 Score 2 2 0 3  PHQ- 9 Score 6 7 - 9    Audit-C Alcohol Use Screening  Question Answer Points  How often do you have alcoholi c drink? Never 0  How many drinks do you typically consume in a day? Never 0  How oftey will you drink 6 or more in a total? never 0  Total Score:  0   A score of 3 or more in women, and 4 or more in men indicates increased risk for alcohol abuse, EXCEPT if all of the points are from question 1.     Assessment & Plan:    Annual Physical Reviewed patient's Family Medical History Reviewed and updated list of patient's medical providers Assessment of cognitive impairment was done Assessed patient's functional ability Established a written schedule for health screening [place] Completed and Reviewed  Exercise Activities and Dietary recommendations Goals    . Exercise 3x per week (30 min per time)     Recommend increasing exercise. Pt to start walking 3 days a week for 30 minutes.       Immunization History  Administered Date(s) Administered  . Influenza Whole 10/18/2012  . Influenza, High Dose Seasonal PF 10/13/2017  . Influenza,inj,Quad PF,6+ Mos 08/30/2015  . Pneumococcal Conjugate-13 11/17/2015  . Pneumococcal Polysaccharide-23 02/01/2006, 05/11/2017  . Tdap 05/11/2017  . Zoster 04/29/2012    Health Maintenance  Topic Date Due  . FOOT EXAM  09/16/1960  . HEMOGLOBIN A1C  05/12/2018  . INFLUENZA VACCINE  07/18/2018  . OPHTHALMOLOGY EXAM  01/21/2019  . COLONOSCOPY  02/13/2021  .  TETANUS/TDAP  05/12/2027  . Hepatitis C Screening  Completed  . PNA vac Low Risk Adult  Completed     Discussed health benefits of physical activity, and encouraged him to engage in regular exercise appropriate for his age and condition.    ------------------------------------------------------------------------------------------------------------ 1. Annual physical exam   2. Type 2 diabetes mellitus with other neurologic complication, without long-term current use of insulin (HCC)  3. CA of prostate (HCC)  - PSA  4. Hypersomnia Consider follow up sleep study if labs normal.   5. Type 2 diabetes mellitus with diabetic neuropathy, unspecified whether long term insulin use (HCC)  - Hemoglobin A1c  6. Essential hypertension Well controlled.  Continue current medications.   - Comprehensive metabolic panel - Lipid panel - EKG 12-Lead  7. Other fatigue  - CBC - TSH    Lelon Huh, MD  [facility]

## 2018-05-22 ENCOUNTER — Ambulatory Visit (INDEPENDENT_AMBULATORY_CARE_PROVIDER_SITE_OTHER): Payer: PPO | Admitting: Family Medicine

## 2018-05-22 ENCOUNTER — Ambulatory Visit (INDEPENDENT_AMBULATORY_CARE_PROVIDER_SITE_OTHER): Payer: PPO

## 2018-05-22 VITALS — BP 136/76 | HR 84 | Temp 99.1°F | Ht 71.0 in | Wt 339.2 lb

## 2018-05-22 DIAGNOSIS — Z0000 Encounter for general adult medical examination without abnormal findings: Secondary | ICD-10-CM

## 2018-05-22 DIAGNOSIS — C61 Malignant neoplasm of prostate: Secondary | ICD-10-CM

## 2018-05-22 DIAGNOSIS — E1149 Type 2 diabetes mellitus with other diabetic neurological complication: Secondary | ICD-10-CM

## 2018-05-22 DIAGNOSIS — I10 Essential (primary) hypertension: Secondary | ICD-10-CM | POA: Diagnosis not present

## 2018-05-22 DIAGNOSIS — R5383 Other fatigue: Secondary | ICD-10-CM | POA: Diagnosis not present

## 2018-05-22 DIAGNOSIS — G4710 Hypersomnia, unspecified: Secondary | ICD-10-CM | POA: Diagnosis not present

## 2018-05-22 DIAGNOSIS — E1140 Type 2 diabetes mellitus with diabetic neuropathy, unspecified: Secondary | ICD-10-CM

## 2018-05-22 NOTE — Progress Notes (Signed)
Subjective:   ROGERICK BALDWIN is a 67 y.o. male who presents for Medicare Annual/Subsequent preventive examination.  Review of Systems:  N/A  Cardiac Risk Factors include: advanced age (>56mn, >>69women);diabetes mellitus;dyslipidemia;hypertension;male gender;obesity (BMI >30kg/m2)     Objective:    Vitals: BP 136/76 (BP Location: Right Arm)   Pulse 84   Temp 99.1 F (37.3 C) (Oral)   Ht 5' 11"  (1.803 m)   Wt (!) 339 lb 3.2 oz (153.9 kg)   BMI 47.31 kg/m   Body mass index is 47.31 kg/m.  Advanced Directives 05/22/2018 02/13/2018 05/09/2017 11/27/2016 04/17/2016 02/01/2016 08/29/2015  Does Patient Have a Medical Advance Directive? No No No No No No No  Would patient like information on creating a medical advance directive? Yes (MAU/Ambulatory/Procedural Areas - Information given) No - Patient declined Yes (ED - Information included in AVS) No - Patient declined No - patient declined information - -    Tobacco Social History   Tobacco Use  Smoking Status Never Smoker  Smokeless Tobacco Never Used     Counseling given: Not Answered   Clinical Intake:  Pre-visit preparation completed: Yes  Pain : No/denies pain Pain Score: 0-No pain     Nutritional Status: BMI > 30  Obese Nutritional Risks: None Diabetes: Yes(type 2) CBG done?: No Did pt. bring in CBG monitor from home?: No  How often do you need to have someone help you when you read instructions, pamphlets, or other written materials from your doctor or pharmacy?: 1 - Never  Interpreter Needed?: No  Information entered by :: [facility]. [facility] LPN  Past Medical History:  Diagnosis Date  . Arthritis   . Colitis   . Depression   . Diabetes mellitus without complication ([place]   . 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 History of chicken pox   . History of measles   . History of mumps   . Hypertension   . Lymph edema    both legs.  mostly left  . Neuromuscular disorder ([facility]   . Prostate CA ([place] 2016   Radiation seed implants.  . Stroke ([facility]   . Temporal arteritis ([facility])    Clinically dx Dr. KJefm Bryant Negative temporal artrtery bx   Past Surgical History:  Procedure Laterality Date  . ABDOMINOPLASTY    . Carotid Doppler Ultrasound  08/22/2014   [facility]; Minimal plaque right. Minimal thisckening left Anterograde flow vertebrals  . COLONOSCOPY WITH PROPOFOL N/A 02/13/2018   Procedure: COLONOSCOPY WITH PROPOFOL;  Surgeon: EManya Silvas MD;  [facility];  Service: Endoscopy;  Laterality: N/A;  . CT of the head  08/22/2014   [facility]. Normal  . DOPPLER ECHOCARDIOGRAPHY  08/29/2014   EF=60-65%. Normal LVEF  . Myocardial Perfusion Scan  09/10/2012   Poor exercsie tolerance, but no evidence of stress induced myocardial ischemia. EF=66%. Dr. CClayborn Bigness . NASAL SEPTOPLASTY W/ TURBINOPLASTY Bilateral 02/10/2016   Procedure: NASAL SEPTOPLASTY WITH INFERIOR TURBINATE REDUCTION;  Surgeon: CCarloyn Manner MD;  Location: [facility] ORS;  Service: ENT;  Laterality: Bilateral;  . pannectomy    . TEMPORAL ARTERY BIOPSY / LIGATION Left    Family History  Problem Relation Age of Onset  . Colon cancer Mother   . Coronary artery disease Mother   . Prostate cancer Father   . Kidney disease Father   . Dementia Brother   . Diabetes Neg Hx    Social History   Socioeconomic History  . Marital status: Married    Spouse name: Not  on file  . Number of children: 1  . Years of education: Not on file  . Highest education level: Some college, no degree  Occupational History  . 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 Financial resource strain: Not hard at all  . Food insecurity:    Worry: Never true    Inability: Never true  . Transportation needs:    Medical: No    Non-medical: No  Tobacco Use  . Smoking status: Never Smoker  . Smokeless tobacco: Never Used  Substance and Sexual Activity  . Alcohol use: Yes    Alcohol/week:  0.0 oz    Comment: Rare - 1 drink  . Drug use: No  . Sexual activity: Not on file  Lifestyle  . Physical activity:    Days per week: Not on file    Minutes per session: Not on file  . Stress: Not at all  Relationships  . Social connections:    Talks on phone: Not on file    Gets together: Not on file    Attends religious service: Not on file    Active member of club or organization: Not on file    Attends meetings of clubs or organizations: Not on file    Relationship status: Not on file  Other Topics Concern  . Not on file  Social History Narrative  . Not on file    Outpatient Encounter Medications as of 05/22/2018  Medication Sig  . buPROPion (WELLBUTRIN SR) 150 MG 12 hr tablet Take 150 mg by mouth daily.  . Cholecalciferol (VITAMIN D3) 5000 units CAPS Take 1,000 Units by mouth daily.   . citalopram (CELEXA) 40 MG tablet Take 1 tablet by mouth daily.  . Coenzyme Q10 (COQ-10) 100 MG CAPS Take 100 mg by mouth daily.  Regino Schultze Bandages & Supports (MEDICAL COMPRESSION STOCKINGS) MISC by Does not apply route.  . folic acid (FOLVITE) 1 MG tablet Take 1 tablet by mouth daily.  Marland Kitchen gabapentin (NEURONTIN) 300 MG capsule TAKE 2 CAPSULES BY MOUTH IN THE MORNING AND 3 CAPSULES AT BEDTIME  . glipiZIDE (GLUCOTROL XL) 2.5 MG 24 hr tablet Take 1 tablet (2.5 mg total) by mouth daily.  . mesalamine (LIALDA) 1.2 g EC tablet Take 4 tablets by mouth daily.  . metFORMIN (GLUCOPHAGE-XR) 500 MG 24 hr tablet TAKE 2 TABLETS (1,000 MG TOTAL) BY MOUTH 2 (TWO) TIMES DAILY.  . mirtazapine (REMERON) 15 MG tablet Take 0.5 tablets by mouth at bedtime.   . pravastatin (PRAVACHOL) 40 MG tablet TAKE 1 TABLET (40 MG TOTAL) BY MOUTH DAILY.  Marland Kitchen predniSONE (DELTASONE) 5 MG tablet Take 5 mg by mouth every other day.   . valsartan-hydrochlorothiazide (DIOVAN-HCT) 80-12.5 MG tablet TAKE 1 TABLET BY MOUTH EVERY DAY  . ALPRAZolam (XANAX) 0.5 MG tablet Take 0.5 mg by mouth at bedtime as needed for anxiety.  Marland Kitchen doxycycline  (VIBRA-TABS) 100 MG tablet Take 1 tablet (100 mg total) by mouth 2 (two) times daily. (Patient not taking: Reported on 05/22/2018)  . Fluticasone-Umeclidin-Vilant (TRELEGY ELLIPTA) 100-62.5-25 MCG/INH AEPB Inhale 1 puff into the lungs daily. (Patient not taking: Reported on 05/22/2018)  . Vitamin D, Ergocalciferol, (DRISDOL) 50000 units CAPS capsule Take 1.25 capsules by mouth once a week.   No facility-administered encounter medications on file as of 05/22/2018.     Activities of Daily Living In your present state of health, do you have any difficulty  performing the following activities: 05/22/2018  Hearing? Y  Comment Wears bilateral hearing aids.   Vision? N  Difficulty concentrating or making decisions? N  Walking or climbing stairs? N  Dressing or bathing? N  Doing errands, shopping? N  Preparing Food and eating ? N  Using the Toilet? N  In the past six months, have you accidently leaked urine? N  Do you have problems with loss of bowel control? N  Managing your Medications? N  Managing your Finances? N  Housekeeping or managing your Housekeeping? N  Some recent data might be hidden    Patient Care Team: Birdie Sons, MD as PCP - General (Family Medicine) Yolonda Kida, MD as Consulting Physician (Cardiology) Manya Silvas, MD (Gastroenterology) Emmaline Kluver., MD as Consulting Physician (Rheumatology) Royston Cowper, MD as Consulting Physician (Urology) Thurmond Butts Orson Ape, MD as Attending Physician (Psychiatry) Eulogio Bear, MD as Consulting Physician (Ophthalmology)   Assessment:   This is a routine wellness examination for Lambert.  Exercise Activities and Dietary recommendations Current Exercise Habits: The patient does not participate in regular exercise at present, Exercise limited by: None identified  Goals    . Exercise 3x per week (30 min per time)     Recommend increasing exercise. Pt to start walking 3 days a week for 30 minutes.        Fall Risk Fall Risk  05/22/2018 05/09/2017 02/22/2017 02/01/2017 12/28/2016  Falls in the past year? No No No - No  Number falls in past yr: - - - - -  Injury with Fall? - - - - -  Risk for fall due to : - - - (No Data) -  Risk for fall due to: Comment - - - no falls since previous visit -  Follow up - - - - -   Is the patient's home free of loose throw rugs in walkways, pet beds, electrical cords, etc?   yes      Grab bars in the bathroom? yes      Handrails on the stairs?   no      Adequate lighting?   yes  Timed Get Up and Go Performed: N/A  Depression Screen PHQ 2/9 Scores 05/22/2018 05/09/2017 11/27/2016 10/13/2016  PHQ - 2 Score 2 2 0 3  PHQ- 9 Score 6 7 - 9    Cognitive Function     6CIT Screen 05/22/2018 05/09/2017  What Year? 0 points 0 points  What month? 0 points 0 points  What time? 0 points 0 points  Count back from 20 0 points 0 points  Months in reverse 4 points 2 points  Repeat phrase 8 points 4 points  Total Score 12 6    Immunization History  Administered Date(s) Administered  . Influenza Whole 10/18/2012  . Influenza, High Dose Seasonal PF 10/13/2017  . Influenza,inj,Quad PF,6+ Mos 08/30/2015  . Pneumococcal Conjugate-13 11/17/2015  . Pneumococcal Polysaccharide-23 02/01/2006, 05/11/2017  . Tdap 05/11/2017  . Zoster 04/29/2012    Qualifies for Shingles Vaccine? Due for Shingles vaccine. Declined my offer to administer today. Education has been provided regarding the importance of this vaccine. Pt has been advised to call her insurance company to determine her out of pocket expense. Advised she may also receive this vaccine at her local pharmacy or Health Dept. Verbalized acceptance and understanding.  Screening Tests Health Maintenance  Topic Date Due  . FOOT EXAM  09/16/1961  . HEMOGLOBIN A1C  05/12/2018  . INFLUENZA  VACCINE  07/18/2018  . OPHTHALMOLOGY EXAM  01/21/2019  . COLONOSCOPY  02/13/2021  . TETANUS/TDAP  05/12/2027  . Hepatitis C  Screening  Completed  . PNA vac Low Risk Adult  Completed   Cancer Screenings: Lung: Low Dose CT Chest recommended if Age 67-80 years, 30 pack-year currently smoking OR have quit w/in 15years. Patient does not qualify. Colorectal: Up to date  Additional Screenings:  Hepatitis C Screening: Up to date      Plan:  I have personally reviewed and addressed the Medicare Annual Wellness questionnaire and have noted the following in the patient's chart:  A. Medical and social history B. Use of alcohol, tobacco or illicit drugs  C. Current medications and supplements D. Functional ability and status E.  Nutritional status F.  Physical activity G. Advance directives H. List of other physicians I.  Hospitalizations, surgeries, and ER visits in previous 12 months J.  [place] such as hearing and vision if needed, cognitive and depression L. Referrals and appointments - none  In addition, I have reviewed and discussed with patient certain preventive protocols, quality metrics, and best practice recommendations. A written personalized care plan for preventive services as well as general preventive health recommendations were provided to patient.  See attached scanned questionnaire for additional information.   Signed,  Fabio Neighbors, LPN Nurse Health Advisor   Nurse Recommendations: Pt needs a diabetic foot exam done and a Hgb A1c checked today.

## 2018-05-22 NOTE — Patient Instructions (Signed)
   The CDC recommends two doses of Shingrix (the shingles vaccine) separated by 2 to 6 months for adults age 68 years and older. I recommend checking with your insurance plan regarding coverage for this vaccine.

## 2018-05-22 NOTE — Patient Instructions (Addendum)
[name] , Thank you for taking time to come for your Medicare Wellness Visit. I appreciate your ongoing commitment to your health goals. Please review the following plan we discussed and let me know if I can assist you in the future.   Screening recommendations/referrals: Colonoscopy: Up to date Recommended yearly ophthalmology/optometry visit for glaucoma screening and checkup Recommended yearly dental visit for hygiene and checkup  Vaccinations: Influenza vaccine: Up to date Pneumococcal vaccine: Up to date Tdap vaccine: Up to date Shingles vaccine: Pt declines today.     Advanced directives: Advance directive discussed with you today. I have provided a copy for you to complete at home and have notarized. Once this is complete please bring a copy in to our office so we can scan it into your chart.  Conditions/risks identified: Obesity- Pt to start walking 3 days a week for at least 30 minutes at at time.   Next appointment: 3:00 PM today with Dr Caryn Section.   Preventive Care 68 Years and Older, Male Preventive care refers to lifestyle choices and visits with your health care provider that can promote health and wellness. What does preventive care include?  A yearly physical exam. This is also called an annual well check.  Dental exams once or twice a year.  Routine eye exams. Ask your health care provider how often you should have your eyes checked.  Personal lifestyle choices, including:  Daily care of your teeth and gums.  Regular physical activity.  Eating a healthy diet.  Avoiding tobacco and drug use.  Limiting alcohol use.  Practicing safe sex.  Taking low doses of aspirin every day.  Taking vitamin and mineral supplements as recommended by your health care provider. What happens during an annual well check? The services and screenings done by your health care provider during your annual well check will depend on your age, overall health, lifestyle risk factors,  and family history of disease. Counseling  Your health care provider may ask you questions about your:  Alcohol use.  Tobacco use.  Drug use.  Emotional well-being.  Home and relationship well-being.  Sexual activity.  Eating habits.  History of falls.  Memory and ability to understand (cognition).  Work and work Statistician. Screening  You may have the following tests or measurements:  Height, weight, and BMI.  Blood pressure.  Lipid and cholesterol levels. These may be checked every 5 years, or more frequently if you are over 13 years old.  Skin check.  Lung cancer screening. You may have this screening every year starting at age 34 if you have a 30-pack-year history of smoking and currently smoke or have quit within the past 15 years.  Fecal occult blood test (FOBT) of the stool. You may have this test every year starting at age 7.  Flexible sigmoidoscopy or colonoscopy. You may have a sigmoidoscopy every 5 years or a colonoscopy every 10 years starting at age 51.  Prostate cancer screening. Recommendations will vary depending on your family history and other risks.  Hepatitis C blood test.  Hepatitis B blood test.  Sexually transmitted disease (STD) testing.  Diabetes screening. This is done by checking your blood sugar (glucose) after you have not eaten for a while (fasting). You may have this done every 1-3 years.  Abdominal aortic aneurysm (AAA) screening. You may need this if you are a current or former smoker.  Osteoporosis. You may be screened starting at age 27 if you are at high risk. Talk with your  health care provider about your test results, treatment options, and if necessary, the need for more tests. Vaccines  Your health care provider may recommend certain vaccines, such as:  Influenza vaccine. This is recommended every year.  Tetanus, diphtheria, and acellular pertussis (Tdap, Td) vaccine. You may need a Td booster every 10  years.  Zoster vaccine. You may need this after age 30.  Pneumococcal 13-valent conjugate (PCV13) vaccine. One dose is recommended after age 18.  Pneumococcal polysaccharide (PPSV23) vaccine. One dose is recommended after age 42. Talk to your health care provider about which screenings and vaccines you need and how often you need them. This information is not intended to replace advice given to you by your health care provider. Make sure you discuss any questions you have with your health care provider. Document Released: 12/31/2015 Document Revised: 08/23/2016 Document Reviewed: 10/05/2015 Elsevier Interactive Patient Education  2017 [place] Prevention in the Home Falls can cause injuries. They can happen to people of all ages. There are many things you can do to make your home safe and to help prevent falls. What can I do on the outside of my home?  Regularly fix the edges of walkways and driveways and fix any cracks.  Remove anything that might make you trip as you walk through a door, such as a raised step or threshold.  Trim any bushes or trees on the path to your home.  Use bright outdoor lighting.  Clear any walking paths of anything that might make someone trip, such as rocks or tools.  Regularly check to see if handrails are loose or broken. Make sure that both sides of any steps have handrails.  Any raised decks and porches should have guardrails on the edges.  Have any leaves, snow, or ice cleared regularly.  Use sand or salt on walking paths during winter.  Clean up any spills in your garage right away. This includes oil or grease spills. What can I do in the bathroom?  Use night lights.  Install grab bars by the toilet and in the tub and shower. Do not use towel bars as grab bars.  Use non-skid mats or decals in the tub or shower.  If you need to sit down in the shower, use a plastic, non-slip stool.  Keep the floor dry. Clean up any water that  spills on the floor as soon as it happens.  Remove soap buildup in the tub or shower regularly.  Attach bath mats securely with double-sided non-slip rug tape.  Do not have throw rugs and other things on the floor that can make you trip. What can I do in the bedroom?  Use night lights.  Make sure that you have a light by your bed that is easy to reach.  Do not use any sheets or blankets that are too big for your bed. They should not hang down onto the floor.  Have a firm chair that has side arms. You can use this for support while you get dressed.  Do not have throw rugs and other things on the floor that can make you trip. What can I do in the kitchen?  Clean up any spills right away.  Avoid walking on wet floors.  Keep items that you use a lot in easy-to-reach places.  If you need to reach something above you, use a strong step stool that has a grab bar.  Keep electrical cords out of the way.  Do not use  floor polish or wax that makes floors slippery. If you must use wax, use non-skid floor wax.  Do not have throw rugs and other things on the floor that can make you trip. What can I do with my stairs?  Do not leave any items on the stairs.  Make sure that there are handrails on both sides of the stairs and use them. Fix handrails that are broken or loose. Make sure that handrails are as long as the stairways.  Check any carpeting to make sure that it is firmly attached to the stairs. Fix any carpet that is loose or worn.  Avoid having throw rugs at the top or bottom of the stairs. If you do have throw rugs, attach them to the floor with carpet tape.  Make sure that you have a light switch at the top of the stairs and the bottom of the stairs. If you do not have them, ask someone to add them for you. What else can I do to help prevent falls?  Wear shoes that:  Do not have high heels.  Have rubber bottoms.  Are comfortable and fit you well.  Are closed at the  toe. Do not wear sandals.  If you use a stepladder:  Make sure that it is fully opened. Do not climb a closed stepladder.  Make sure that both sides of the stepladder are locked into place.  Ask someone to hold it for you, if possible.  Clearly mark and make sure that you can see:  Any grab bars or handrails.  First and last steps.  Where the edge of each step is.  Use tools that help you move around (mobility aids) if they are needed. These include:  Canes.  Walkers.  Scooters.  Crutches.  Turn on the lights when you go into a dark area. Replace any light bulbs as soon as they burn out.  Set up your furniture so you have a clear path. Avoid moving your furniture around.  If any of your floors are uneven, fix them.  If there are any pets around you, be aware of where they are.  Review your medicines with your doctor. Some medicines can make you feel dizzy. This can increase your chance of falling. Ask your doctor what other things that you can do to help prevent falls. This information is not intended to replace advice given to you by your health care provider. Make sure you discuss any questions you have with your health care provider. Document Released: 09/30/2009 Document Revised: 05/11/2016 Document Reviewed: 01/08/2015 Elsevier Interactive Patient Education  2017 [employer].

## 2018-05-23 ENCOUNTER — Other Ambulatory Visit: Payer: Self-pay | Admitting: Family Medicine

## 2018-05-23 DIAGNOSIS — G4710 Hypersomnia, unspecified: Secondary | ICD-10-CM

## 2018-05-23 DIAGNOSIS — G4733 Obstructive sleep apnea (adult) (pediatric): Secondary | ICD-10-CM

## 2018-05-23 LAB — COMPREHENSIVE METABOLIC PANEL
A/G RATIO: 1.5 (ref 1.2–2.2)
ALBUMIN: 3.8 g/dL (ref 3.6–4.8)
ALK PHOS: 91 IU/L (ref 39–117)
ALT: 17 IU/L (ref 0–44)
AST: 14 IU/L (ref 0–40)
BILIRUBIN TOTAL: 0.3 mg/dL (ref 0.0–1.2)
BUN / CREAT RATIO: 20 (ref 10–24)
BUN: 18 mg/dL (ref 8–27)
CHLORIDE: 101 mmol/L (ref 96–106)
CO2: 28 mmol/L (ref 20–29)
Calcium: 9.2 mg/dL (ref 8.6–10.2)
Creatinine, Ser: 0.9 mg/dL (ref 0.76–1.27)
GFR calc Af Amer: 102 mL/min/{1.73_m2} (ref >59)
GFR calc non Af Amer: 88 mL/min/{1.73_m2} (ref >59)
GLOBULIN, TOTAL: 2.5 g/dL (ref 1.5–4.5)
Glucose: 150 mg/dL — ABNORMAL HIGH (ref 65–99)
Potassium: 4.4 mmol/L (ref 3.5–5.2)
SODIUM: 143 mmol/L (ref 134–144)
TOTAL PROTEIN: 6.3 g/dL (ref 6.0–8.5)

## 2018-05-23 LAB — PSA

## 2018-05-23 LAB — LIPID PANEL
CHOL/HDL RATIO: 2.2 ratio (ref 0.0–5.0)
Cholesterol, Total: 145 mg/dL (ref 100–199)
HDL: 65 mg/dL (ref >39)
LDL CALC: 60 mg/dL (ref 0–99)
TRIGLYCERIDES: 98 mg/dL (ref 0–149)
VLDL Cholesterol Cal: 20 mg/dL (ref 5–40)

## 2018-05-23 LAB — TSH: TSH: 2.28 u[IU]/mL (ref 0.450–4.500)

## 2018-05-23 LAB — HEMOGLOBIN A1C
ESTIMATED AVERAGE GLUCOSE: 183 mg/dL
Hgb A1c MFr Bld: 8 % — ABNORMAL HIGH (ref 4.8–5.6)

## 2018-05-23 LAB — CBC
HEMATOCRIT: 41.9 % (ref 37.5–51.0)
Hemoglobin: 14 g/dL (ref 13.0–17.7)
MCH: 28.5 pg (ref 26.6–33.0)
MCHC: 33.4 g/dL (ref 31.5–35.7)
MCV: 85 fL (ref 79–97)
PLATELETS: 247 10*3/uL (ref 150–450)
RBC: 4.91 x10E6/uL (ref 4.14–5.80)
RDW: 14.9 % (ref 12.3–15.4)
WBC: 9.5 10*3/uL (ref 3.4–10.8)

## 2018-05-24 ENCOUNTER — Other Ambulatory Visit: Payer: Self-pay | Admitting: Family Medicine

## 2018-05-26 DIAGNOSIS — L03115 Cellulitis of right lower limb: Secondary | ICD-10-CM | POA: Diagnosis not present

## 2018-05-26 DIAGNOSIS — Z7984 Long term (current) use of oral hypoglycemic drugs: Secondary | ICD-10-CM | POA: Diagnosis not present

## 2018-05-26 DIAGNOSIS — L03116 Cellulitis of left lower limb: Secondary | ICD-10-CM | POA: Diagnosis not present

## 2018-05-26 DIAGNOSIS — Z23 Encounter for immunization: Secondary | ICD-10-CM | POA: Diagnosis not present

## 2018-05-26 DIAGNOSIS — E119 Type 2 diabetes mellitus without complications: Secondary | ICD-10-CM | POA: Diagnosis not present

## 2018-05-26 DIAGNOSIS — R600 Localized edema: Secondary | ICD-10-CM | POA: Diagnosis not present

## 2018-05-26 DIAGNOSIS — S81851A Open bite, right lower leg, initial encounter: Secondary | ICD-10-CM | POA: Diagnosis not present

## 2018-05-26 DIAGNOSIS — S81852A Open bite, left lower leg, initial encounter: Secondary | ICD-10-CM | POA: Diagnosis not present

## 2018-06-05 DIAGNOSIS — F331 Major depressive disorder, recurrent, moderate: Secondary | ICD-10-CM | POA: Diagnosis not present

## 2018-06-10 DIAGNOSIS — D400 Neoplasm of uncertain behavior of prostate: Secondary | ICD-10-CM | POA: Diagnosis not present

## 2018-06-10 DIAGNOSIS — N5201 Erectile dysfunction due to arterial insufficiency: Secondary | ICD-10-CM | POA: Diagnosis not present

## 2018-06-10 DIAGNOSIS — C61 Malignant neoplasm of prostate: Secondary | ICD-10-CM | POA: Diagnosis not present

## 2018-06-10 DIAGNOSIS — E291 Testicular hypofunction: Secondary | ICD-10-CM | POA: Diagnosis not present

## 2018-06-10 DIAGNOSIS — N481 Balanitis: Secondary | ICD-10-CM | POA: Diagnosis not present

## 2018-06-18 ENCOUNTER — Telehealth: Payer: Self-pay | Admitting: Family Medicine

## 2018-06-18 NOTE — Telephone Encounter (Signed)
Order for home sleep study faxed to [facility] °

## 2018-06-19 NOTE — Telephone Encounter (Signed)
Patient calling about his sleep study and that he has not heard anything. Advised patient as directed below that order for home study was faxed and someone is going to get in contact with him.  Thanks,  -Brix Brearley

## 2018-07-02 DIAGNOSIS — G4733 Obstructive sleep apnea (adult) (pediatric): Secondary | ICD-10-CM | POA: Diagnosis not present

## 2018-07-02 DIAGNOSIS — R0602 Shortness of breath: Secondary | ICD-10-CM | POA: Diagnosis not present

## 2018-07-03 DIAGNOSIS — G4733 Obstructive sleep apnea (adult) (pediatric): Secondary | ICD-10-CM | POA: Diagnosis not present

## 2018-07-03 DIAGNOSIS — R0602 Shortness of breath: Secondary | ICD-10-CM | POA: Diagnosis not present

## 2018-07-09 DIAGNOSIS — M316 Other giant cell arteritis: Secondary | ICD-10-CM | POA: Diagnosis not present

## 2018-07-10 ENCOUNTER — Telehealth: Payer: Self-pay | Admitting: Family Medicine

## 2018-07-10 DIAGNOSIS — C61 Malignant neoplasm of prostate: Secondary | ICD-10-CM | POA: Diagnosis not present

## 2018-07-10 DIAGNOSIS — N5201 Erectile dysfunction due to arterial insufficiency: Secondary | ICD-10-CM | POA: Diagnosis not present

## 2018-07-10 DIAGNOSIS — D400 Neoplasm of uncertain behavior of prostate: Secondary | ICD-10-CM | POA: Diagnosis not present

## 2018-07-10 DIAGNOSIS — E291 Testicular hypofunction: Secondary | ICD-10-CM | POA: Diagnosis not present

## 2018-07-10 NOTE — Telephone Encounter (Signed)
Please advise results? 

## 2018-07-10 NOTE — Telephone Encounter (Signed)
Please call and see if report is done. It's not in chart.

## 2018-07-10 NOTE — Telephone Encounter (Signed)
Contacted Parke Poisson. She states the home sleep study was done with [facility] (affliated with [facility]) She advised me to contact Viraat at [phone] to see if report was ready. Wyat states since sleep study was only  done last week he doubts the study has been interpreted by the provider. He will double check once he gets back to his office. He states once he receives the report he will hand deliver it to the office, so that Dr.Fisher can review results and sign the order for a CPAP machine if needed.   Patient advised as stated above.

## 2018-07-10 NOTE — Telephone Encounter (Signed)
Pt needs results from his sleep study that was done last week  CB# [phone]  Thanks teri

## 2018-07-11 NOTE — Telephone Encounter (Signed)
OK, please advise patient.

## 2018-07-11 NOTE — Telephone Encounter (Signed)
Patient was advised on 07/10/18.

## 2018-07-15 NOTE — Telephone Encounter (Signed)
Patient was notified of results. Expressed.understanding.

## 2018-07-15 NOTE — Telephone Encounter (Signed)
Sleep study shows SEVERE sleep apnea. Need to try another CPAP, have sent order to [facility]. If he doesn't tolerate CPAP then will try Bipap

## 2018-07-18 ENCOUNTER — Telehealth: Payer: Self-pay | Admitting: Family Medicine

## 2018-07-18 NOTE — Telephone Encounter (Signed)
Pt called and said Dr. Caryn Section told him he needed to start using a c-pap machine again and ask him to call us back in a few days if no one has reached out to him about setting up for a new machine.  He has not been contacted yet by anyone about getting a new c-pap machine.  Pt's CB# is [phone]  Thanks teri

## 2018-07-19 NOTE — Telephone Encounter (Signed)
Left message for Ivin from [facility] to return call to check status of c-pap machine

## 2018-07-19 NOTE — Telephone Encounter (Signed)
Please advise 

## 2018-07-22 DIAGNOSIS — H34232 Retinal artery branch occlusion, left eye: Secondary | ICD-10-CM | POA: Diagnosis not present

## 2018-07-22 DIAGNOSIS — H47011 Ischemic optic neuropathy, right eye: Secondary | ICD-10-CM | POA: Diagnosis not present

## 2018-07-26 DIAGNOSIS — F331 Major depressive disorder, recurrent, moderate: Secondary | ICD-10-CM | POA: Diagnosis not present

## 2018-07-26 NOTE — Telephone Encounter (Signed)
Pt has called back saying no one has reached out to him about getting the CPap machine.  He has been waiting a couple of weeks now  Pt's CB is [phone]  Thanks, [employer]

## 2018-07-29 DIAGNOSIS — E119 Type 2 diabetes mellitus without complications: Secondary | ICD-10-CM | POA: Diagnosis not present

## 2018-07-29 DIAGNOSIS — I209 Angina pectoris, unspecified: Secondary | ICD-10-CM | POA: Diagnosis not present

## 2018-07-29 DIAGNOSIS — F329 Major depressive disorder, single episode, unspecified: Secondary | ICD-10-CM | POA: Diagnosis not present

## 2018-07-29 DIAGNOSIS — R601 Generalized edema: Secondary | ICD-10-CM | POA: Diagnosis not present

## 2018-07-29 DIAGNOSIS — I10 Essential (primary) hypertension: Secondary | ICD-10-CM | POA: Diagnosis not present

## 2018-07-29 DIAGNOSIS — I890 Lymphedema, not elsewhere classified: Secondary | ICD-10-CM | POA: Diagnosis not present

## 2018-07-29 DIAGNOSIS — M316 Other giant cell arteritis: Secondary | ICD-10-CM | POA: Diagnosis not present

## 2018-07-29 DIAGNOSIS — Z6841 Body Mass Index (BMI) 40.0 and over, adult: Secondary | ICD-10-CM | POA: Diagnosis not present

## 2018-07-29 DIAGNOSIS — R0602 Shortness of breath: Secondary | ICD-10-CM | POA: Diagnosis not present

## 2018-07-29 DIAGNOSIS — G4733 Obstructive sleep apnea (adult) (pediatric): Secondary | ICD-10-CM | POA: Diagnosis not present

## 2018-07-30 DIAGNOSIS — H2513 Age-related nuclear cataract, bilateral: Secondary | ICD-10-CM | POA: Diagnosis not present

## 2018-07-31 ENCOUNTER — Other Ambulatory Visit: Payer: Self-pay

## 2018-07-31 ENCOUNTER — Inpatient Hospital Stay
Admission: EM | Admit: 2018-07-31 | Discharge: 2018-08-05 | DRG: 868 | Disposition: A | Discharge status: Home Health | Payer: PPO | Attending: Internal Medicine | Admitting: Internal Medicine

## 2018-07-31 ENCOUNTER — Encounter: Payer: Self-pay | Admitting: Emergency Medicine

## 2018-07-31 DIAGNOSIS — R7881 Bacteremia: Secondary | ICD-10-CM

## 2018-07-31 DIAGNOSIS — M316 Other giant cell arteritis: Secondary | ICD-10-CM | POA: Diagnosis not present

## 2018-07-31 DIAGNOSIS — Z452 Encounter for adjustment and management of vascular access device: Secondary | ICD-10-CM | POA: Diagnosis not present

## 2018-07-31 DIAGNOSIS — E785 Hyperlipidemia, unspecified: Secondary | ICD-10-CM | POA: Diagnosis not present

## 2018-07-31 DIAGNOSIS — L03115 Cellulitis of right lower limb: Secondary | ICD-10-CM | POA: Diagnosis not present

## 2018-07-31 DIAGNOSIS — Z7984 Long term (current) use of oral hypoglycemic drugs: Secondary | ICD-10-CM

## 2018-07-31 DIAGNOSIS — R6520 Severe sepsis without septic shock: Secondary | ICD-10-CM | POA: Diagnosis not present

## 2018-07-31 DIAGNOSIS — Z8249 Family history of ischemic heart disease and other diseases of the circulatory system: Secondary | ICD-10-CM

## 2018-07-31 DIAGNOSIS — E1140 Type 2 diabetes mellitus with diabetic neuropathy, unspecified: Secondary | ICD-10-CM | POA: Diagnosis present

## 2018-07-31 DIAGNOSIS — Z8619 Personal history of other infectious and parasitic diseases: Secondary | ICD-10-CM | POA: Diagnosis not present

## 2018-07-31 DIAGNOSIS — E668 Other obesity: Secondary | ICD-10-CM | POA: Diagnosis present

## 2018-07-31 DIAGNOSIS — M1711 Unilateral primary osteoarthritis, right knee: Secondary | ICD-10-CM | POA: Diagnosis not present

## 2018-07-31 DIAGNOSIS — A280 Pasteurellosis: Secondary | ICD-10-CM | POA: Diagnosis not present

## 2018-07-31 DIAGNOSIS — E872 Acidosis: Secondary | ICD-10-CM | POA: Diagnosis present

## 2018-07-31 DIAGNOSIS — Z8673 Personal history of transient ischemic attack (TIA), and cerebral infarction without residual deficits: Secondary | ICD-10-CM

## 2018-07-31 DIAGNOSIS — Z8546 Personal history of malignant neoplasm of prostate: Secondary | ICD-10-CM

## 2018-07-31 DIAGNOSIS — E119 Type 2 diabetes mellitus without complications: Secondary | ICD-10-CM | POA: Diagnosis not present

## 2018-07-31 DIAGNOSIS — M25561 Pain in right knee: Secondary | ICD-10-CM | POA: Diagnosis not present

## 2018-07-31 DIAGNOSIS — Z8042 Family history of malignant neoplasm of prostate: Secondary | ICD-10-CM | POA: Diagnosis not present

## 2018-07-31 DIAGNOSIS — Z23 Encounter for immunization: Secondary | ICD-10-CM | POA: Diagnosis not present

## 2018-07-31 DIAGNOSIS — I890 Lymphedema, not elsewhere classified: Secondary | ICD-10-CM | POA: Diagnosis not present

## 2018-07-31 DIAGNOSIS — A419 Sepsis, unspecified organism: Secondary | ICD-10-CM

## 2018-07-31 DIAGNOSIS — F329 Major depressive disorder, single episode, unspecified: Secondary | ICD-10-CM | POA: Diagnosis present

## 2018-07-31 DIAGNOSIS — Z79899 Other long term (current) drug therapy: Secondary | ICD-10-CM | POA: Diagnosis not present

## 2018-07-31 DIAGNOSIS — Z6841 Body Mass Index (BMI) 40.0 and over, adult: Secondary | ICD-10-CM

## 2018-07-31 DIAGNOSIS — F339 Major depressive disorder, recurrent, unspecified: Secondary | ICD-10-CM | POA: Diagnosis not present

## 2018-07-31 DIAGNOSIS — Z7952 Long term (current) use of systemic steroids: Secondary | ICD-10-CM

## 2018-07-31 DIAGNOSIS — R000 Tachycardia, unspecified: Secondary | ICD-10-CM | POA: Diagnosis not present

## 2018-07-31 DIAGNOSIS — W5501XA Bitten by cat, initial encounter: Secondary | ICD-10-CM | POA: Diagnosis not present

## 2018-07-31 DIAGNOSIS — G629 Polyneuropathy, unspecified: Secondary | ICD-10-CM | POA: Diagnosis not present

## 2018-07-31 DIAGNOSIS — Z5181 Encounter for therapeutic drug level monitoring: Secondary | ICD-10-CM | POA: Diagnosis not present

## 2018-07-31 DIAGNOSIS — Z923 Personal history of irradiation: Secondary | ICD-10-CM

## 2018-07-31 DIAGNOSIS — M1990 Unspecified osteoarthritis, unspecified site: Secondary | ICD-10-CM | POA: Diagnosis not present

## 2018-07-31 DIAGNOSIS — I10 Essential (primary) hypertension: Secondary | ICD-10-CM | POA: Diagnosis present

## 2018-07-31 DIAGNOSIS — K5190 Ulcerative colitis, unspecified, without complications: Secondary | ICD-10-CM | POA: Diagnosis present

## 2018-07-31 DIAGNOSIS — M7989 Other specified soft tissue disorders: Secondary | ICD-10-CM | POA: Diagnosis present

## 2018-07-31 DIAGNOSIS — S81851A Open bite, right lower leg, initial encounter: Secondary | ICD-10-CM | POA: Diagnosis not present

## 2018-07-31 DIAGNOSIS — A4159 Other Gram-negative sepsis: Secondary | ICD-10-CM | POA: Diagnosis not present

## 2018-07-31 HISTORY — DX: Bitten by cat, initial encounter: W55.01XA

## 2018-07-31 LAB — URINALYSIS, COMPLETE (UACMP) WITH MICROSCOPIC
BILIRUBIN URINE: NEGATIVE
Bacteria, UA: NONE SEEN
HGB URINE DIPSTICK: NEGATIVE
KETONES UR: 5 mg/dL — AB
LEUKOCYTES UA: NEGATIVE
NITRITE: NEGATIVE
PH: 6 (ref 5.0–8.0)
Protein, ur: NEGATIVE mg/dL
SPECIFIC GRAVITY, URINE: 1.013 (ref 1.005–1.030)

## 2018-07-31 LAB — CBC WITH DIFFERENTIAL/PLATELET
BASOS ABS: 0.1 10*3/uL (ref 0–0.1)
BASOS PCT: 1 %
EOS PCT: 0 %
Eosinophils Absolute: 0.1 10*3/uL (ref 0–0.7)
HCT: 42.6 % (ref 40.0–52.0)
Hemoglobin: 14.3 g/dL (ref 13.0–18.0)
LYMPHS PCT: 5 %
Lymphs Abs: 0.8 10*3/uL — ABNORMAL LOW (ref 1.0–3.6)
MCH: 29.2 pg (ref 26.0–34.0)
MCHC: 33.6 g/dL (ref 32.0–36.0)
MCV: 86.8 fL (ref 80.0–100.0)
MONO ABS: 0.4 10*3/uL (ref 0.2–1.0)
Monocytes Relative: 2 %
NEUTROS ABS: 14.8 10*3/uL — AB (ref 1.4–6.5)
Neutrophils Relative %: 92 %
PLATELETS: 239 10*3/uL (ref 150–440)
RBC: 4.91 MIL/uL (ref 4.40–5.90)
RDW: 15.2 % — AB (ref 11.5–14.5)
WBC: 16.1 10*3/uL — ABNORMAL HIGH (ref 3.8–10.6)

## 2018-07-31 LAB — COMPREHENSIVE METABOLIC PANEL
ALT: 19 U/L (ref 0–44)
AST: 31 U/L (ref 15–41)
Albumin: 3.6 g/dL (ref 3.5–5.0)
Alkaline Phosphatase: 78 U/L (ref 38–126)
Anion gap: 8 (ref 5–15)
BUN: 14 mg/dL (ref 8–23)
CALCIUM: 8.8 mg/dL — AB (ref 8.9–10.3)
CHLORIDE: 102 mmol/L (ref 98–111)
CO2: 27 mmol/L (ref 22–32)
CREATININE: 1.02 mg/dL (ref 0.61–1.24)
GFR calc non Af Amer: >60 mL/min (ref >60)
Glucose, Bld: 202 mg/dL — ABNORMAL HIGH (ref 70–99)
Potassium: 4.7 mmol/L (ref 3.5–5.1)
SODIUM: 137 mmol/L (ref 135–145)
Total Bilirubin: 0.8 mg/dL (ref 0.3–1.2)
Total Protein: 6.7 g/dL (ref 6.5–8.1)

## 2018-07-31 LAB — LACTIC ACID, PLASMA
LACTIC ACID, VENOUS: 3.2 mmol/L — AB (ref 0.5–1.9)
LACTIC ACID, VENOUS: 2.3 mmol/L — AB (ref 0.5–1.9)

## 2018-07-31 LAB — PROTIME-INR
INR: 1.02
Prothrombin Time: 13.3 seconds (ref 11.4–15.2)

## 2018-07-31 LAB — LIPASE, BLOOD: LIPASE: 22 U/L (ref 11–51)

## 2018-07-31 LAB — GLUCOSE, CAPILLARY: Glucose-Capillary: 179 mg/dL — ABNORMAL HIGH (ref 70–99)

## 2018-07-31 MED ORDER — GLIPIZIDE ER 2.5 MG PO TB24
2.5000 mg | ORAL_TABLET | Freq: Every day | ORAL | Status: DC
  Administered 2018-08-01 – 2018-08-04 (×4): 2.5 mg via ORAL
  Filled 2018-07-31 (×5): qty 1

## 2018-07-31 MED ORDER — COQ-10 100 MG PO CAPS: 100.0000 mg | ORAL_CAPSULE | Freq: Every day | ORAL | Status: DC

## 2018-07-31 MED ORDER — PIPERACILLIN-TAZOBACTAM 3.375 G IVPB 30 MIN
3.3750 g | Freq: Once | INTRAVENOUS | Status: AC
  Administered 2018-07-31: 3.375 g via INTRAVENOUS
  Filled 2018-07-31: qty 50

## 2018-07-31 MED ORDER — PRAVASTATIN SODIUM 20 MG PO TABS
40.0000 mg | ORAL_TABLET | Freq: Every day | ORAL | Status: DC
  Administered 2018-08-01 – 2018-08-04 (×4): 40 mg via ORAL
  Filled 2018-07-31 (×4): qty 2

## 2018-07-31 MED ORDER — MIRTAZAPINE 15 MG PO TABS
7.5000 mg | ORAL_TABLET | Freq: Every evening | ORAL | Status: DC | PRN
  Filled 2018-07-31: qty 1

## 2018-07-31 MED ORDER — MESALAMINE 1.2 G PO TBEC
2.4000 g | DELAYED_RELEASE_TABLET | Freq: Two times a day (BID) | ORAL | Status: DC
  Administered 2018-08-01 – 2018-08-04 (×8): 2.4 g via ORAL
  Filled 2018-07-31 (×11): qty 2

## 2018-07-31 MED ORDER — VANCOMYCIN HCL IN DEXTROSE 1-5 GM/200ML-% IV SOLN
1000.0000 mg | Freq: Once | INTRAVENOUS | Status: AC
  Administered 2018-07-31: 1000 mg via INTRAVENOUS
  Filled 2018-07-31: qty 200

## 2018-07-31 MED ORDER — CITALOPRAM HYDROBROMIDE 20 MG PO TABS
40.0000 mg | ORAL_TABLET | Freq: Every day | ORAL | Status: DC
  Administered 2018-08-01 – 2018-08-04 (×4): 40 mg via ORAL
  Filled 2018-07-31 (×4): qty 2

## 2018-07-31 MED ORDER — PREDNISONE 5 MG PO TABS
5.0000 mg | ORAL_TABLET | ORAL | Status: DC
  Administered 2018-08-01 – 2018-08-03 (×2): 5 mg via ORAL
  Filled 2018-07-31 (×3): qty 1

## 2018-07-31 MED ORDER — FOLIC ACID 1 MG PO TABS
1.0000 mg | ORAL_TABLET | Freq: Every day | ORAL | Status: DC
  Administered 2018-08-01 – 2018-08-04 (×4): 1 mg via ORAL
  Filled 2018-07-31 (×4): qty 1

## 2018-07-31 MED ORDER — HYDROMORPHONE HCL 1 MG/ML IJ SOLN
0.5000 mg | INTRAMUSCULAR | Status: AC
  Administered 2018-07-31: 0.5 mg via INTRAVENOUS
  Filled 2018-07-31: qty 1

## 2018-07-31 MED ORDER — GABAPENTIN 300 MG PO CAPS
900.0000 mg | ORAL_CAPSULE | Freq: Every day | ORAL | Status: DC
Start: 2018-07-31 — End: 2018-08-05
  Administered 2018-08-01 – 2018-08-04 (×4): 900 mg via ORAL
  Filled 2018-07-31 (×4): qty 3

## 2018-07-31 MED ORDER — MORPHINE SULFATE (PF) 2 MG/ML IV SOLN
2.0000 mg | INTRAVENOUS | Status: DC | PRN
  Administered 2018-07-31 – 2018-08-01 (×3): 2 mg via INTRAVENOUS
  Filled 2018-07-31 (×4): qty 1

## 2018-07-31 MED ORDER — HEPARIN SODIUM (PORCINE) 5000 UNIT/ML IJ SOLN
5000.0000 [IU] | Freq: Three times a day (TID) | INTRAMUSCULAR | Status: DC
  Administered 2018-08-01 – 2018-08-05 (×12): 5000 [IU] via SUBCUTANEOUS
  Filled 2018-07-31 (×12): qty 1

## 2018-07-31 MED ORDER — DOCUSATE SODIUM 100 MG PO CAPS: 100.0000 mg | ORAL_CAPSULE | Freq: Two times a day (BID) | ORAL | Status: DC | PRN

## 2018-07-31 MED ORDER — METFORMIN HCL ER 500 MG PO TB24
1000.0000 mg | ORAL_TABLET | Freq: Two times a day (BID) | ORAL | Status: DC
  Administered 2018-08-01 – 2018-08-04 (×8): 1000 mg via ORAL
  Filled 2018-07-31 (×11): qty 2

## 2018-07-31 MED ORDER — FENTANYL CITRATE (PF) 100 MCG/2ML IJ SOLN
50.0000 ug | INTRAMUSCULAR | Status: DC | PRN
  Administered 2018-07-31: 50 ug via INTRAVENOUS
  Filled 2018-07-31: qty 2

## 2018-07-31 MED ORDER — ONDANSETRON HCL 4 MG/2ML IJ SOLN
4.0000 mg | Freq: Once | INTRAMUSCULAR | Status: AC
  Administered 2018-07-31: 4 mg via INTRAVENOUS
  Filled 2018-07-31: qty 2

## 2018-07-31 MED ORDER — PIPERACILLIN-TAZOBACTAM 3.375 G IVPB
3.3750 g | Freq: Three times a day (TID) | INTRAVENOUS | Status: DC
  Administered 2018-07-31 – 2018-08-01 (×2): 3.375 g via INTRAVENOUS
  Filled 2018-07-31 (×2): qty 50

## 2018-07-31 MED ORDER — INSULIN ASPART 100 UNIT/ML ~~LOC~~ SOLN
0.0000 [IU] | Freq: Three times a day (TID) | SUBCUTANEOUS | Status: DC
  Administered 2018-08-01: 2 [IU] via SUBCUTANEOUS
  Administered 2018-08-01: 1 [IU] via SUBCUTANEOUS
  Administered 2018-08-01 – 2018-08-02 (×2): 2 [IU] via SUBCUTANEOUS
  Administered 2018-08-02: 1 [IU] via SUBCUTANEOUS
  Administered 2018-08-02: 2 [IU] via SUBCUTANEOUS
  Administered 2018-08-03 (×2): 1 [IU] via SUBCUTANEOUS
  Administered 2018-08-03: 3 [IU] via SUBCUTANEOUS
  Administered 2018-08-04: 1 [IU] via SUBCUTANEOUS
  Administered 2018-08-04: 3 [IU] via SUBCUTANEOUS
  Administered 2018-08-04: 2 [IU] via SUBCUTANEOUS
  Filled 2018-07-31 (×12): qty 1

## 2018-07-31 MED ORDER — ACETAMINOPHEN 325 MG PO TABS
650.0000 mg | ORAL_TABLET | Freq: Four times a day (QID) | ORAL | Status: DC | PRN
  Administered 2018-07-31 – 2018-08-02 (×3): 650 mg via ORAL
  Filled 2018-07-31 (×3): qty 2

## 2018-07-31 MED ORDER — BUPROPION HCL ER (SR) 150 MG PO TB12
150.0000 mg | ORAL_TABLET | Freq: Every day | ORAL | Status: DC
  Administered 2018-08-01 – 2018-08-04 (×4): 150 mg via ORAL
  Filled 2018-07-31 (×5): qty 1

## 2018-07-31 MED ORDER — SODIUM CHLORIDE 0.9 % IV BOLUS
1000.0000 mL | Freq: Once | INTRAVENOUS | Status: AC
  Administered 2018-07-31: 1000 mL via INTRAVENOUS

## 2018-07-31 MED ORDER — VITAMIN D3 25 MCG (1000 UNIT) PO TABS
5000.0000 [IU] | ORAL_TABLET | Freq: Every day | ORAL | Status: DC
  Administered 2018-08-01 – 2018-08-04 (×4): 5000 [IU] via ORAL
  Filled 2018-07-31 (×8): qty 5

## 2018-07-31 MED ORDER — GABAPENTIN 300 MG PO CAPS
600.0000 mg | ORAL_CAPSULE | Freq: Every morning | ORAL | Status: DC
  Administered 2018-08-01 – 2018-08-04 (×4): 600 mg via ORAL
  Filled 2018-07-31 (×4): qty 2

## 2018-07-31 MED ORDER — GABAPENTIN 300 MG PO CAPS: 600.0000 mg | ORAL_CAPSULE | ORAL | Status: DC

## 2018-07-31 NOTE — Progress Notes (Signed)
Family Meeting Note  Advance Directive:yes  Today a meeting took place with the Patient and spouse.  The following clinical team members were present during this meeting:MD  The following were discussed:Patient's diagnosis: Hypertension, hyperlipidemia, diabetes, cat bite, sepsis, Patient's progosis: Unable to determine and Goals for treatment: Full Code  Additional follow-up to be provided: PMD  Time spent during discussion:20 minutes  Vaughan Basta, MD

## 2018-07-31 NOTE — Progress Notes (Signed)
Pharmacy Antibiotic Note  Edward King is a 66 y.o. male admitted on 07/31/2018 with Cat bite/cellulitis.  Pharmacy has been consulted for Zosyn dosing.  Plan: Zosyn 3.375g IV q8h (4 hour infusion).  Height: 5' 11"  (180.3 cm) Weight: (!) 335 lb (152 kg) IBW/kg (Calculated) : 75.3  Temp (24hrs), Avg:101.4 F (38.6 C), Min:101.4 F (38.6 C), Max:101.4 F (38.6 C)  Recent Labs  Lab 07/31/18 1746 07/31/18 1930  WBC 16.1*  --   CREATININE 1.02  --   LATICACIDVEN 3.2* 2.3*    Estimated Creatinine Clearance: 105.4 mL/min (by C-G formula based on SCr of 1.02 mg/dL).    No Known Allergies  Antimicrobials this admission: Vanc x 1 in ER  8/14 >>   Zosyn 8/14 >>    Dose adjustments this admission:    Microbiology results:  8/14 BCx: P   UCx:      Sputum:      MRSA PCR:    Thank you for allowing pharmacy to be a part of this patient's care.  Clarise Chacko A 07/31/2018 9:01 PM

## 2018-07-31 NOTE — Telephone Encounter (Signed)
I spoke with Jeneen Rinks from [facility] who states that order for CPAP was delivered to office and waiting for signed order to be faxed back.He will bring another copy by office to be signed

## 2018-07-31 NOTE — ED Notes (Signed)
Dr. Jacqualine Code notified to that lactic acid is elevated, per Dr. Jacqualine Code, pt meets sepsis criteria and needs next bed.

## 2018-07-31 NOTE — ED Notes (Signed)
Pt noted to desat to 87% on RA, placed on 2L via [place] at this time, sats 93% on 2L via [place].

## 2018-07-31 NOTE — ED Triage Notes (Signed)
Patient reports that yesterday afternoon he was "nipped" by his cat on right calf. States since then he has had increased pain and redness to leg. Patient febrile upon arrival to ED. History of DM.

## 2018-07-31 NOTE — H&P (Signed)
[facility] [place] NAME: Edward King    MR#:  [id number]  DATE OF BIRTH:  [birth year]  DATE OF ADMISSION:  07/31/2018  PRIMARY CARE PHYSICIAN: Edward Sons, MD   REQUESTING/REFERRING PHYSICIAN: Quale  CHIEF COMPLAINT:   Chief Complaint  Patient presents with  . Animal Bite  . Cellulitis    HISTORY OF PRESENT ILLNESS: Edward King  is a 67 y.o. male with a known history of colitis, depression, diabetes, hypertension, prostate cancer, stroke, temporal arteritis, neuropathy-lives home with his wife and normally walks without minimal support. Had a cat bite on his right foot yesterday and since then his leg is swollen, red, painful and he is having chills and fever so came to emergency room. He is noted to be septic and given to hospitalist team for further management after starting initial antibiotics.  PAST MEDICAL HISTORY:   Past Medical History:  Diagnosis Date  . Arthritis   . Colitis   . Depression   . Diabetes mellitus without complication ([place])   . 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 History of chicken pox   . History of measles   . History of mumps   . Hypertension   . Lymph edema    both legs.  mostly left  . Neuromuscular disorder ([place])   . Prostate CA ([place]) 2016   Radiation seed implants.  . Stroke ([place])   . Temporal arteritis ([facility])    Clinically dx Dr. Jefm Bryant. Negative temporal artrtery bx    PAST SURGICAL HISTORY:  Past Surgical History:  Procedure Laterality Date  . ABDOMINOPLASTY    . Carotid Doppler Ultrasound  08/22/2014   [facility]; Minimal plaque right. Minimal thisckening left Anterograde flow vertebrals  . COLONOSCOPY WITH PROPOFOL N/A 02/13/2018   Procedure: COLONOSCOPY WITH PROPOFOL;  Surgeon: Manya Silvas, MD;  Location[facility] ENDOSCOPY;  Service: Endoscopy;  Laterality: N/A;  . CT of the head  08/22/2014   [facility]. Normal  . DOPPLER ECHOCARDIOGRAPHY  08/29/2014   EF=60-65%. Normal LVEF  .  Myocardial Perfusion Scan  09/10/2012   Poor exercsie tolerance, but no evidence of stress induced myocardial ischemia. EF=66%. Dr. Clayborn Bigness  . NASAL SEPTOPLASTY W/ TURBINOPLASTY Bilateral 02/10/2016   Procedure: NASAL SEPTOPLASTY WITH INFERIOR TURBINATE REDUCTION;  Surgeon: Carloyn Manner, MD;  Location: [facility] ORS;  Service: ENT;  Laterality: Bilateral;  . pannectomy    . TEMPORAL ARTERY BIOPSY / LIGATION Left     SOCIAL HISTORY:  Social History   Tobacco Use  . Smoking status: Never Smoker  . Smokeless tobacco: Never Used  Substance Use Topics  . Alcohol use: Yes    Alcohol/week: 0.0 standard drinks    Comment: Rare - 1 drink    FAMILY HISTORY:  Family History  Problem Relation Age of Onset  . Colon cancer Mother   . Coronary artery disease Mother   . Prostate cancer Father   . Kidney disease Father   . Dementia Brother   . Diabetes Neg Hx     DRUG ALLERGIES: No Known Allergies  REVIEW OF SYSTEMS:   CONSTITUTIONAL: Have fever, no fatigue or weakness.  EYES: No blurred or double vision.  EARS, NOSE, AND THROAT: No tinnitus or ear pain.  RESPIRATORY: No cough, shortness of breath, wheezing or hemoptysis.  CARDIOVASCULAR: No chest pain, orthopnea, edema.  GASTROINTESTINAL: No nausea, vomiting, diarrhea or abdominal pain.  GENITOURINARY: No dysuria, hematuria.  ENDOCRINE: No polyuria, nocturia,  HEMATOLOGY: No anemia, easy  bruising or bleeding SKIN: No rash or lesion. MUSCULOSKELETAL: No joint pain or arthritis.   NEUROLOGIC: No tingling, numbness, weakness.  PSYCHIATRY: No anxiety or depression.   MEDICATIONS AT HOME:  Prior to Admission medications   Medication Sig Start Date End Date Taking? Authorizing Provider  buPROPion (WELLBUTRIN SR) 150 MG 12 hr tablet Take 150 mg by mouth daily.   Yes [provider]  Cholecalciferol (VITAMIN D3) 5000 units TABS Take 5,000 Units by mouth daily.   Yes [provider]  citalopram (CELEXA) 40 MG tablet Take  40 mg by mouth daily.    Yes [provider]  Coenzyme Q10 (COQ-10) 100 MG CAPS Take 100 mg by mouth daily.   Yes [provider]  folic acid (FOLVITE) 1 MG tablet Take 1 mg by mouth daily with lunch.    Yes [provider]  gabapentin (NEURONTIN) 300 MG capsule TAKE 2 CAPSULES BY MOUTH IN THE MORNING AND 3 CAPSULES AT BEDTIME Patient taking differently: Take 600-900 mg by mouth See admin instructions. 600 mg every morning and 900 mg at bedtime 05/24/18  Yes Edward Sons, MD  glipiZIDE (GLUCOTROL XL) 2.5 MG 24 hr tablet Take 1 tablet (2.5 mg total) by mouth daily. 11/12/17  Yes Edward Sons, MD  mesalamine (LIALDA) 1.2 g EC tablet Take 2.4 g by mouth 2 (two) times daily.    Yes [provider]  metFORMIN (GLUCOPHAGE-XR) 500 MG 24 hr tablet TAKE 2 TABLETS (1,000 MG TOTAL) BY MOUTH 2 (TWO) TIMES DAILY. 11/28/17  Yes Edward Sons, MD  mirtazapine (REMERON) 15 MG tablet Take 0.5 tablets by mouth at bedtime as needed (for sleep).    Yes [provider]  pravastatin (PRAVACHOL) 40 MG tablet TAKE 1 TABLET (40 MG TOTAL) BY MOUTH DAILY. Patient taking differently: Take 40 mg by mouth daily with lunch.  02/25/18  Yes Edward Sons, MD  predniSONE (DELTASONE) 5 MG tablet Take 5 mg by mouth every other day.  03/03/16  Yes [provider]  valsartan-hydrochlorothiazide (DIOVAN-HCT) 80-12.5 MG tablet TAKE 1 TABLET BY MOUTH EVERY DAY Patient taking differently: Take 1 tablet by mouth daily.  05/25/17  Yes Edward Sons, MD      PHYSICAL EXAMINATION:   VITAL SIGNS: Blood pressure 125/72, pulse (!) 106, temperature (!) 101.4 F (38.6 C), temperature source Oral, resp. rate 20, height 5' 11"  (1.803 m), weight (!) 152 kg, SpO2 93 %.  GENERAL:  67 y.o.-year-old obese patient lying in the bed with no acute distress.  EYES: Pupils equal, round, reactive to light and accommodation. No scleral icterus. Extraocular muscles intact.  HEENT: Head  atraumatic, normocephalic. Oropharynx and nasopharynx clear.  NECK:  Supple, no jugular venous distention. No thyroid enlargement, no tenderness.  LUNGS: Normal breath sounds bilaterally, no wheezing, rales,rhonchi or crepitation. No use of accessory muscles of respiration.  CARDIOVASCULAR: S1, S2 normal. No murmurs, rubs, or gallops.  ABDOMEN: Soft, nontender, nondistended. Bowel sounds present. No organomegaly or mass.  EXTREMITIES: No pedal edema, cyanosis, or clubbing.  Right leg is red, warm, swelling, tender.  Pulses are present on dorsalis pedis on the right side, no inguinal lymphadenopathy on the right side. NEUROLOGIC: Cranial nerves II through XII are intact. Muscle strength 5/5 in all extremities. Sensation intact. Gait not checked.  PSYCHIATRIC: The patient is alert and oriented x 3.  SKIN: No obvious rash, lesion, or ulcer.   LABORATORY PANEL:   CBC Recent Labs  Lab 07/31/18 1746  WBC  16.1*  HGB 14.3  HCT 42.6  PLT 239  MCV 86.8  MCH 29.2  MCHC 33.6  RDW 15.2*  LYMPHSABS 0.8*  MONOABS 0.4  EOSABS 0.1  BASOSABS 0.1   ------------------------------------------------------------------------------------------------------------------  Chemistries  Recent Labs  Lab 07/31/18 1746  NA 137  K 4.7  CL 102  CO2 27  GLUCOSE 202*  BUN 14  CREATININE 1.02  CALCIUM 8.8*  AST 31  ALT 19  ALKPHOS 78  BILITOT 0.8   ------------------------------------------------------------------------------------------------------------------ estimated creatinine clearance is 105.4 mL/min (by C-G formula based on SCr of 1.02 mg/dL). ------------------------------------------------------------------------------------------------------------------ No results for input(s): TSH, T4TOTAL, T3FREE, THYROIDAB in the last 72 hours.  Invalid input(s): FREET3   Coagulation profile Recent Labs  Lab 07/31/18 1930  INR 1.02    ------------------------------------------------------------------------------------------------------------------- No results for input(s): DDIMER in the last 72 hours. -------------------------------------------------------------------------------------------------------------------  Cardiac Enzymes No results for input(s): CKMB, TROPONINI, MYOGLOBIN in the last 168 hours.  Invalid input(s): CK ------------------------------------------------------------------------------------------------------------------ Invalid input(s): POCBNP  ---------------------------------------------------------------------------------------------------------------  Urinalysis    Component Value Date/Time   COLORURINE YELLOW (A) 07/31/2018 1747   APPEARANCEUR CLEAR (A) 07/31/2018 1747   LABSPEC 1.013 07/31/2018 1747   PHURINE 6.0 07/31/2018 1747   GLUCOSEU >=500 (A) 07/31/2018 1747   HGBUR NEGATIVE 07/31/2018 1747   BILIRUBINUR NEGATIVE 07/31/2018 1747   BILIRUBINUR negative 03/08/2016 1701   KETONESUR 5 (A) 07/31/2018 1747   PROTEINUR NEGATIVE 07/31/2018 1747   UROBILINOGEN 0.2 03/08/2016 1701   NITRITE NEGATIVE 07/31/2018 1747   LEUKOCYTESUR NEGATIVE 07/31/2018 1747     RADIOLOGY: No results found.  EKG: Orders placed or performed during the hospital encounter of 07/31/18  . ED EKG 12-Lead  . ED EKG 12-Lead  . EKG 12-Lead  . EKG 12-Lead    IMPRESSION AND PLAN:  * Sepsis Due to cat bite and cellulitis IV vancomycin and Zosyn as started by ER and cultures are sent. Monitor the cultures. We may need to speak to infectious disease specialist about antibiotic of choice at the time of discharge.  *Lactic acidosis Due to sepsis, follow-up tomorrow morning.  *Neuropathy Continue gabapentin.  *Diabetes Continue oral medications and keep on sliding scale coverage.  *Hyperlipidemia Continue statin.  All the records are reviewed and case discussed with ED provider. Management  plans discussed with the patient, family and they are in agreement.  CODE STATUS: full. Code Status History    Date Active Date Inactive Code Status Order ID Comments User Context   08/29/2015 0722 08/30/2015 1554 Full Code [id number]  Harrie Foreman, MD Inpatient      Wife in room during my visit. TOTAL TIME TAKING CARE OF THIS PATIENT: 45 minutes.    Vaughan Basta M.D on 07/31/2018   Between 7am to 6pm - Pager - [phone]  After 6pm go to [email or web address] - password [email or web address] [place]  Office  [phone]  CC: Primary care physician; Edward Sons, MD   Note: This dictation was prepared with Dragon dictation along with smaller phrase technology. Any transcriptional errors that result from this process are unintentional.

## 2018-07-31 NOTE — Progress Notes (Signed)
CODE SEPSIS - PHARMACY COMMUNICATION  **Broad Spectrum Antibiotics should be administered within 1 hour of Sepsis diagnosis**  Time Code Sepsis Called/Page Received: 1839  Antibiotics Ordered: Vanc/Zosyn  Time of 1st antibiotic administration: 1902  Additional action taken by pharmacy:    If necessary, Name of Provider/Nurse Contacted:      Noralee Space ,PharmD Clinical Pharmacist  07/31/2018  7:25 PM

## 2018-07-31 NOTE — ED Notes (Signed)
Patient visibly in pain in triage. Rocking back and forth and calling out in pain. Patient also beginning to vomit and have dry heaves. Pain and nausea medication given.

## 2018-07-31 NOTE — ED Provider Notes (Signed)
[facility] Emergency Department Provider Note   ____________________________________________   First MD Initiated Contact with Patient 07/31/18 1846     (approximate)  I have reviewed the triage vital signs and the nursing notes.   HISTORY  Chief Complaint Animal Bite and Cellulitis    HPI Edward King is a 67 y.o. male reports that he was bit by his cat yesterday over the right lower leg.  He started having severe redness swelling and pain in the leg thereafter.  She also notes he started having a fever.  Reports she was bit a few months ago by cat as well, and he had a similar but not near as bad reaction was treated with antibiotics while at [facility]  He is up-to-date on his tetanus had that about 6 months ago, he reports his cat is fully vaccinated against rabies and the cat that bit him is his own.  Denies pain anywhere else.  He reports the right leg hurts because the swelling and pain is very red and hot.  Associated fevers.  No trouble breathing or chest pain.  Reports right leg pain is severe throbbing fullness feeling.  Past Medical History:  Diagnosis Date  . Arthritis   . Colitis   . Depression   . Diabetes mellitus without complication ([place])   . 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 History of chicken pox   . History of measles   . History of mumps   . Hypertension   . Lymph edema    both legs.  mostly left  . Neuromuscular disorder ([place])   . Prostate CA ([place]) 2016   Radiation seed implants.  . Stroke ([place])   . Temporal arteritis ([facility])    Clinically dx Dr. Jefm Bryant. Negative temporal artrtery bx    Patient Active Problem List   Diagnosis Date Noted  . Chronic colitis 03/15/2018  . Hypersomnia 05/11/2017  . Fatigue 05/11/2017  . Multiple lung nodules on CT 02/29/2016  . 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 Hypertension 06/16/2015  . CA of prostate ([place]) 06/16/2015  . Obstructive sleep apnea 06/16/2015  . 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 Cough 02/28/2013  . Peripheral edema 02/28/2013    Past Surgical History:  Procedure Laterality Date  . ABDOMINOPLASTY    . Carotid Doppler Ultrasound  08/22/2014   [facility]; Minimal plaque right. Minimal thisckening left Anterograde flow vertebrals  . COLONOSCOPY WITH PROPOFOL N/A 02/13/2018   Procedure: COLONOSCOPY WITH PROPOFOL;  Surgeon: Manya Silvas, MD;  Location: [facility] ENDOSCOPY;  Service: Endoscopy;  Laterality: N/A;  . CT of the head  08/22/2014   [facility]. Normal  . DOPPLER ECHOCARDIOGRAPHY  08/29/2014   EF=60-65%. Normal LVEF  . Myocardial Perfusion Scan  09/10/2012   Poor exercsie tolerance, but no evidence of stress induced myocardial ischemia. EF=66%. Dr. Clayborn Bigness  . NASAL SEPTOPLASTY W/ TURBINOPLASTY Bilateral 02/10/2016   Procedure: NASAL SEPTOPLASTY WITH INFERIOR TURBINATE REDUCTION;  Surgeon: Carloyn Manner, MD;  Location: [facility] ORS;  Service: ENT;  Laterality: Bilateral;  . pannectomy    . TEMPORAL ARTERY BIOPSY / LIGATION Left     Prior to Admission medications   Medication Sig Start Date End Date Taking? Authorizing Provider  ALPRAZolam Duanne Moron) 0.5 MG tablet Take 0.5 mg by mouth at bedtime as needed for anxiety.    [provider]  buPROPion (WELLBUTRIN SR) 150 MG 12 hr tablet Take 150 mg by mouth daily.    [provider]  Cholecalciferol (VITAMIN D3) 5000 units CAPS Take 1,000 Units by mouth daily.     [provider]  citalopram (CELEXA) 40 MG tablet Take 1 tablet by mouth daily. 04/05/16   [provider]  Coenzyme Q10 (COQ-10) 100 MG CAPS Take 100 mg by mouth daily.    [provider]  Elastic Bandages & Supports ([place]) MISC by Does not apply route.    [provider]  folic acid (FOLVITE) 1 MG tablet Take 1 tablet by mouth daily. 12/30/15   [provider]  gabapentin (NEURONTIN) 300 MG capsule TAKE 2 CAPSULES BY  MOUTH IN THE MORNING AND 3 CAPSULES AT BEDTIME 05/24/18   Birdie Sons, MD  glipiZIDE (GLUCOTROL XL) 2.5 MG 24 hr tablet Take 1 tablet (2.5 mg total) by mouth daily. 11/12/17   Birdie Sons, MD  mesalamine (LIALDA) 1.2 g EC tablet Take 4 tablets by mouth daily. 02/21/18 02/21/19  [provider]  metFORMIN (GLUCOPHAGE-XR) 500 MG 24 hr tablet TAKE 2 TABLETS (1,000 MG TOTAL) BY MOUTH 2 (TWO) TIMES DAILY. 11/28/17   Birdie Sons, MD  mirtazapine (REMERON) 15 MG tablet Take 0.5 tablets by mouth at bedtime.     [provider]  pravastatin (PRAVACHOL) 40 MG tablet TAKE 1 TABLET (40 MG TOTAL) BY MOUTH DAILY. 02/25/18   Birdie Sons, MD  predniSONE (DELTASONE) 5 MG tablet Take 5 mg by mouth every other day.  03/03/16   [provider]  valsartan-hydrochlorothiazide (DIOVAN-HCT) 80-12.5 MG tablet TAKE 1 TABLET BY MOUTH EVERY DAY 05/25/17   Birdie Sons, MD  Vitamin D, Ergocalciferol, (DRISDOL) 50000 units CAPS capsule Take 1.25 capsules by mouth once a week. 02/10/17   [provider]    Allergies Patient has no known allergies.  Family History  Problem Relation Age of Onset  . Colon cancer Mother   . Coronary artery disease Mother   . Prostate cancer Father   . Kidney disease Father   . Dementia Brother   . Diabetes Neg Hx     Social History Social History   Tobacco Use  . Smoking status: Never Smoker  . Smokeless tobacco: Never Used  Substance Use Topics  . Alcohol use: Yes    Alcohol/week: 0.0 standard drinks    Comment: Rare - 1 drink  . Drug use: No    Review of Systems Constitutional: Fevers and chills today eyes: No visual changes. ENT: No sore throat. Cardiovascular: Denies chest pain. Respiratory: Denies shortness of breath. Gastrointestinal: No abdominal pain.  No nausea, no vomiting.  No diarrhea.  No constipation. Genitourinary: Negative for dysuria. Musculoskeletal: See HPI skin: See HPI Neurological: Negative for  headaches, focal weakness or numbness.    ____________________________________________   PHYSICAL EXAM:  VITAL SIGNS: ED Triage Vitals  Enc Vitals Group     BP 07/31/18 1724 (!) 127/102     Pulse Rate 07/31/18 1724 97     Resp 07/31/18 1724 20     Temp 07/31/18 1724 (!) 101.4 F (38.6 C)     Temp Source 07/31/18 1724 Oral     SpO2 07/31/18 1724 98 %     Weight 07/31/18 1726 (!) 335 lb (152 kg)     Height 07/31/18 1726 5' 11"  (1.803 m)     Head Circumference --      Peak Flow --      Pain Score 07/31/18 1735 8     Pain Loc --  Pain Edu? --      Excl. in [place]? --     Constitutional: Alert and oriented.  Slightly diaphoretic, reports pain in the right calf and looks uncomfortable.  He and wife are very pleasant. Eyes: Conjunctivae are normal.  Hard of hearing, Head: Atraumatic. Nose: No congestion/rhinnorhea. Mouth/Throat: Mucous membranes are moist. Neck: No stridor.   Cardiovascular: Normal rate, regular rhythm. Grossly normal heart sounds.  Good peripheral circulation. Respiratory: Normal respiratory effort.  No retractions. Lungs CTAB. Gastrointestinal: Soft and nontender. No distention.  Orbitally obese.  Musculoskeletal: Left lower extremity appears normal without skin changes.  The right lower extremity from just below the right knee to just above the right ankle is circumferentially swollen erythematous warm and hot.  There is a very small punctate wound over the right lateral mid calf which is reportedly where he was bit by the cat yesterday.  There is no venous cords or congestion.  He has strong peripheral dorsalis pedis pulses and normal capillary refill.  Compartments are soft in the lower leg. Neurologic:  Normal speech and language. No gross focal neurologic deficits are appreciated.  Skin:  Skin is warm, dry and intact. No rash noted. Psychiatric: Mood and affect are normal. Speech and behavior are normal.  ____________________________________________    LABS (all labs ordered are listed, but only abnormal results are displayed)  Labs Reviewed  COMPREHENSIVE METABOLIC PANEL - Abnormal; Notable for the following components:      Result Value   Glucose, Bld 202 (*)    Calcium 8.8 (*)    All other components within normal limits  LACTIC ACID, PLASMA - Abnormal; Notable for the following components:   Lactic Acid, Venous 3.2 (*)    All other components within normal limits  CBC WITH DIFFERENTIAL/PLATELET - Abnormal; Notable for the following components:   WBC 16.1 (*)    RDW 15.2 (*)    Neutro Abs 14.8 (*)    Lymphs Abs 0.8 (*)    All other components within normal limits  URINALYSIS, COMPLETE (UACMP) WITH MICROSCOPIC - Abnormal; Notable for the following components:   Color, Urine YELLOW (*)    APPearance CLEAR (*)    Glucose, UA >=500 (*)    Ketones, ur 5 (*)    All other components within normal limits  CULTURE, BLOOD (ROUTINE X 2)  CULTURE, BLOOD (ROUTINE X 2)  LACTIC ACID, PLASMA  PROTIME-INR  LIPASE, BLOOD   ____________________________________________  EKG  Reviewed and interpreted by me at 1900 Heart rate 105 QRS 115 QTc 440 Sinus tachycardia, no evidence of acute ischemia.  Right bundle branch block ____________________________________________  RADIOLOGY   ____________________________________________   PROCEDURES  Procedure(s) performed: None  Procedures  Critical Care performed: Yes, see critical care note(s)  CRITICAL CARE Performed by: Delman Kitten   Total critical care time: 35 minutes  Critical care time was exclusive of separately billable procedures and treating other patients.  Critical care was necessary to treat or prevent imminent or life-threatening deterioration.  Critical care was time spent personally by me on the following activities: development of treatment plan with patient and/or surrogate as well as nursing, discussions with consultants, evaluation of patient's response to  treatment, examination of patient, obtaining history from patient or surrogate, ordering and performing treatments and interventions, ordering and review of laboratory studies, ordering and review of radiographic studies, pulse oximetry and re-evaluation of patient's condition.  ____________________________________________   INITIAL IMPRESSION / ASSESSMENT AND PLAN / ED COURSE  Pertinent labs &  imaging results that were available during my care of the patient were reviewed by me and considered in my medical decision making (see chart for details).  Patient presents for evidence of obvious right lower leg infection, cellulitis after a cat bite.  He meets criteria for severe sepsis with elevated lactate.  Initiate fluid boluses though his blood pressure is stable at this time will evaluate for lactate clearance.  His lactate has not yet grown greater than 4.  Broad-spectrum antibiotics including Zosyn and vancomycin.  Reports cat is up-to-date on its rabies and he is updated on his tetanus  Will admit to the hospitalist for further care and management, severe sepsis.  Patient agreement with plan and understanding.      ____________________________________________   FINAL CLINICAL IMPRESSION(S) / ED DIAGNOSES  Final diagnoses:  Severe sepsis ([place])  Cat bite, initial encounter  Cellulitis of right leg without foot      NEW MEDICATIONS STARTED DURING THIS VISIT:  New Prescriptions   No medications on file     Note:  This document was prepared using Dragon voice recognition software and may include unintentional dictation errors.     Delman Kitten, MD 07/31/18 [phone]

## 2018-08-01 DIAGNOSIS — S81851A Open bite, right lower leg, initial encounter: Secondary | ICD-10-CM

## 2018-08-01 DIAGNOSIS — R7881 Bacteremia: Secondary | ICD-10-CM

## 2018-08-01 DIAGNOSIS — M25561 Pain in right knee: Secondary | ICD-10-CM

## 2018-08-01 DIAGNOSIS — Z7952 Long term (current) use of systemic steroids: Secondary | ICD-10-CM

## 2018-08-01 DIAGNOSIS — Z8546 Personal history of malignant neoplasm of prostate: Secondary | ICD-10-CM

## 2018-08-01 DIAGNOSIS — W5501XA Bitten by cat, initial encounter: Secondary | ICD-10-CM

## 2018-08-01 DIAGNOSIS — F339 Major depressive disorder, recurrent, unspecified: Secondary | ICD-10-CM

## 2018-08-01 DIAGNOSIS — Z923 Personal history of irradiation: Secondary | ICD-10-CM

## 2018-08-01 DIAGNOSIS — K5190 Ulcerative colitis, unspecified, without complications: Secondary | ICD-10-CM

## 2018-08-01 DIAGNOSIS — Z79899 Other long term (current) drug therapy: Secondary | ICD-10-CM

## 2018-08-01 DIAGNOSIS — E119 Type 2 diabetes mellitus without complications: Secondary | ICD-10-CM

## 2018-08-01 DIAGNOSIS — M316 Other giant cell arteritis: Secondary | ICD-10-CM

## 2018-08-01 DIAGNOSIS — Z7984 Long term (current) use of oral hypoglycemic drugs: Secondary | ICD-10-CM

## 2018-08-01 DIAGNOSIS — Z791 Long term (current) use of non-steroidal anti-inflammatories (NSAID): Secondary | ICD-10-CM

## 2018-08-01 DIAGNOSIS — I890 Lymphedema, not elsewhere classified: Secondary | ICD-10-CM

## 2018-08-01 DIAGNOSIS — L03115 Cellulitis of right lower limb: Secondary | ICD-10-CM

## 2018-08-01 LAB — BASIC METABOLIC PANEL
Anion gap: 7 (ref 5–15)
BUN: 15 mg/dL (ref 8–23)
CALCIUM: 8 mg/dL — AB (ref 8.9–10.3)
CO2: 30 mmol/L (ref 22–32)
Chloride: 100 mmol/L (ref 98–111)
Creatinine, Ser: 1.04 mg/dL (ref 0.61–1.24)
GFR calc Af Amer: >60 mL/min (ref >60)
GLUCOSE: 185 mg/dL — AB (ref 70–99)
Potassium: 3.7 mmol/L (ref 3.5–5.1)
Sodium: 137 mmol/L (ref 135–145)

## 2018-08-01 LAB — GLUCOSE, CAPILLARY
GLUCOSE-CAPILLARY: 128 mg/dL — AB (ref 70–99)
Glucose-Capillary: 154 mg/dL — ABNORMAL HIGH (ref 70–99)
Glucose-Capillary: 190 mg/dL — ABNORMAL HIGH (ref 70–99)
Glucose-Capillary: 154 mg/dL — ABNORMAL HIGH (ref 70–99)

## 2018-08-01 LAB — CBC
HEMATOCRIT: 37.8 % — AB (ref 40.0–52.0)
Hemoglobin: 12.5 g/dL — ABNORMAL LOW (ref 13.0–18.0)
MCH: 28.6 pg (ref 26.0–34.0)
MCHC: 33.1 g/dL (ref 32.0–36.0)
MCV: 86.4 fL (ref 80.0–100.0)
Platelets: 201 10*3/uL (ref 150–440)
RBC: 4.37 MIL/uL — ABNORMAL LOW (ref 4.40–5.90)
RDW: 15.4 % — AB (ref 11.5–14.5)
WBC: 15.7 10*3/uL — ABNORMAL HIGH (ref 3.8–10.6)

## 2018-08-01 LAB — BLOOD CULTURE ID PANEL (REFLEXED)
ACINETOBACTER BAUMANNII: NOT DETECTED
CANDIDA ALBICANS: NOT DETECTED
CANDIDA TROPICALIS: NOT DETECTED
Candida glabrata: NOT DETECTED
Candida krusei: NOT DETECTED
Candida parapsilosis: NOT DETECTED
ENTEROBACTERIACEAE SPECIES: NOT DETECTED
Enterobacter cloacae complex: NOT DETECTED
Enterococcus species: NOT DETECTED
Escherichia coli: NOT DETECTED
HAEMOPHILUS INFLUENZAE: NOT DETECTED
KLEBSIELLA PNEUMONIAE: NOT DETECTED
Klebsiella oxytoca: NOT DETECTED
Listeria monocytogenes: NOT DETECTED
NEISSERIA MENINGITIDIS: NOT DETECTED
Proteus species: NOT DETECTED
Pseudomonas aeruginosa: NOT DETECTED
STAPHYLOCOCCUS SPECIES: NOT DETECTED
STREPTOCOCCUS AGALACTIAE: NOT DETECTED
Serratia marcescens: NOT DETECTED
Staphylococcus aureus (BCID): NOT DETECTED
Streptococcus pneumoniae: NOT DETECTED
Streptococcus pyogenes: NOT DETECTED
Streptococcus species: NOT DETECTED

## 2018-08-01 LAB — LACTIC ACID, PLASMA: LACTIC ACID, VENOUS: 1.3 mmol/L (ref 0.5–1.9)

## 2018-08-01 MED ORDER — AMPICILLIN-SULBACTAM SODIUM 3 (2-1) G IJ SOLR
3.0000 g | Freq: Four times a day (QID) | INTRAMUSCULAR | Status: DC
  Administered 2018-08-01 – 2018-08-05 (×16): 3 g via INTRAVENOUS
  Filled 2018-08-01 (×19): qty 3

## 2018-08-01 NOTE — Progress Notes (Signed)
Pharmacy Antibiotic Note  Edward King is a 67 y.o. male admitted on 07/31/2018 with a cat bite/cellulitis.  Pharmacy has been consulted for Unasyn dosing. He was originally started on vancomycin and Zosyn. However BCID returned 2/4 GNR this morning with no identification. Correlating with clinical picture pasteurella is suspected.  Plan: Unasyn 3 grams IV every 6 hours  Height: 5' 11"  (180.3 cm) Weight: (!) 335 lb (152 kg) IBW/kg (Calculated) : 75.3  Temp (24hrs), Avg:99.5 F (37.5 C), Min:97.9 F (36.6 C), Max:101.4 F (38.6 C)  Recent Labs  Lab 07/31/18 1746 07/31/18 1930 08/01/18 0522  WBC 16.1*  --  15.7*  CREATININE 1.02  --  1.04  LATICACIDVEN 3.2* 2.3* 1.3    Estimated Creatinine Clearance: 103.3 mL/min (by C-G formula based on SCr of 1.04 mg/dL).    No Known Allergies  Antimicrobials this admission: Vanc x 1 in ER  8/14 >>   Zosyn 8/14 >> 8/15 Unasyn 8/15>>  Microbiology results:  8/14 BCx: 2/4 GNR   Thank you for allowing pharmacy to be a part of this patient's care.  Dallie Piles, PharmD 08/01/2018 11:19 AM

## 2018-08-01 NOTE — Progress Notes (Addendum)
[facility] [place] NAME: Edward King    MR#:  [id number]  DATE OF BIRTH:  11/26/1951  SUBJECTIVE:  CHIEF COMPLAINT: Patient is feeling better right leg pain better.  Wife at bedside.  REVIEW OF SYSTEMS:  CONSTITUTIONAL: No fever, fatigue or weakness.  EYES: No blurred or double vision.  EARS, NOSE, AND THROAT: No tinnitus or ear pain.  RESPIRATORY: No cough, shortness of breath, wheezing or hemoptysis.  CARDIOVASCULAR: No chest pain, orthopnea, edema.  GASTROINTESTINAL: No nausea, vomiting, diarrhea or abdominal pain.  GENITOURINARY: No dysuria, hematuria.  ENDOCRINE: No polyuria, nocturia,  HEMATOLOGY: No anemia, easy bruising or bleeding SKIN: Right leg-red and painful  mUSCULOSKELETAL: No joint pain or arthritis.   NEUROLOGIC: No tingling, numbness, weakness.  PSYCHIATRY: No anxiety or depression.   DRUG ALLERGIES:  No Known Allergies  VITALS:  Blood pressure 117/67, pulse 83, temperature 98.3 F (36.8 C), temperature source Oral, resp. rate 17, height 5' 11"  (1.803 m), weight (!) 152 kg, SpO2 94 %.  PHYSICAL EXAMINATION:  GENERAL:  67 y.o.-year-old patient lying in the bed with no acute distress.  EYES: Pupils equal, round, reactive to light and accommodation. No scleral icterus. Extraocular muscles intact.  HEENT: Head atraumatic, normocephalic. Oropharynx and nasopharynx clear.  NECK:  Supple, no jugular venous distention. No thyroid enlargement, no tenderness.  LUNGS: Normal breath sounds bilaterally, no wheezing, rales,rhonchi or crepitation. No use of accessory muscles of respiration.  CARDIOVASCULAR: S1, S2 normal. No murmurs, rubs, or gallops.  ABDOMEN: Soft, nontender, nondistended. Bowel sounds present. No organomegaly or mass.  EXTREMITIES: No pedal edema, cyanosis, or clubbing.  NEUROLOGIC: Cranial nerves II through XII are intact. Muscle strength 5/5 in all extremities. Sensation intact. Gait not checked.   PSYCHIATRIC: The patient is alert and oriented x 3.  SKIN: Right leg is erythematous, edematous tender.  Pulses are present on dorsalis pedis on the right side, no inguinal lymphadenopathy on the right side    LABORATORY PANEL:   CBC Recent Labs  Lab 08/01/18 0522  WBC 15.7*  HGB 12.5*  HCT 37.8*  PLT 201   ------------------------------------------------------------------------------------------------------------------  Chemistries  Recent Labs  Lab 07/31/18 1746 08/01/18 0522  NA 137 137  K 4.7 3.7  CL 102 100  CO2 27 30  GLUCOSE 202* 185*  BUN 14 15  CREATININE 1.02 1.04  CALCIUM 8.8* 8.0*  AST 31  --   ALT 19  --   ALKPHOS 78  --   BILITOT 0.8  --    ------------------------------------------------------------------------------------------------------------------  Cardiac Enzymes No results for input(s): TROPONINI in the last 168 hours. ------------------------------------------------------------------------------------------------------------------  RADIOLOGY:  No results found.  EKG:   Orders placed or performed during the hospital encounter of 07/31/18  . ED EKG 12-Lead  . ED EKG 12-Lead  . EKG 12-Lead  . EKG 12-Lead    ASSESSMENT AND PLAN:   * Sepsis Due to cat bite and cellulitis IV vancomycin and Zosyn narrowed down to IV Unasyn  1 set of blood cultures with gram-negative rods Monitor the cultures. Consult infectious disease Tetanus  if not given so far  *Lactic acidosis Trending down.  2.3-1.3  *Neuropathy secondary to diabetes Continue gabapentin.  *Diabetes Continue oral medications and keep on sliding scale coverage.  *Hyperlipidemia Continue statin.     All the records are reviewed and case discussed with Care Management/Social Workerr. Management plans discussed with the patient, family and they are in agreement.  CODE STATUS:  fc  TOTAL TIME TAKING CARE OF THIS PATIENT: 36  minutes.   POSSIBLE D/C IN 2-3 DAYS,  DEPENDING ON CLINICAL CONDITION.  Note: This dictation was prepared with Dragon dictation along with smaller phrase technology. Any transcriptional errors that result from this process are unintentional.   [name] M.D on 08/01/2018 at 4:23 PM  Between 7am to 6pm - Pager - [phone] After 6pm go to [email or web address] - password [email or web address] [facility]  Office  [phone]  CC: Primary care physician; Birdie Sons, MD

## 2018-08-01 NOTE — Consult Note (Addendum)
Date of Admission:  07/31/2018           Reason for Consult:Cat bite    Referring Provider: Gouru   Principal Problem:   Sepsis [facility]) Active Problems:   Cat bite    HPI: Edward King is a 67 y.o. male history of diabetes mellitus, bilateral lymphedema of the legs, temporal arteritis, colitis presents with swelling of the right leg, pain and redness with fever following a cat bite sustained on Tuesday.  Patient states his cat who is declawed jumped on his trousers playfully when he was walking and then slid down and in the process nipped  him on his right leg.  This happened on Tuesday afternoon.  That leg was swelling that evening and the next day it was very painful to walk and was red and he was having fever.  He came to the emergency room on Wednesday evening around 5.30pm.  A few months ago patient had sustained a bite from the same cat in a playful manner.  Has bilateral lymphedema.  He also takes 5 mg of prednisone every other day for temporal arteritis.  He was recently started on mesalamine for colitis.  I am asked to see the patient as the blood culture has gram-negative rods.  Patient did not wade in any pool of water including the [facility] or fresh water.  In the ED his temperature was 101.4 blood pressure was 127/102 and heart rate was 97.  WBC was 16.1.  Blood cultures were sent and he was started on vancomycin and Zosyn. Past Medical History:  Diagnosis Date  . Arthritis   . Colitis   . Depression   . Diabetes mellitus without complication ([place])   . 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 History of chicken pox   . History of measles   . History of mumps   . Hypertension   . Lymph edema    both legs.  mostly left  . Neuromuscular disorder ([place])   . Prostate CA ([place]) 2016   Radiation seed implants.  . Stroke ([place])   . Temporal arteritis ([facility])    Clinically dx Dr. Jefm Bryant. Negative temporal artrtery bx    Past Surgical History:  Procedure Laterality Date  .  ABDOMINOPLASTY    . Carotid Doppler Ultrasound  08/22/2014   [facility]; Minimal plaque right. Minimal thisckening left Anterograde flow vertebrals  . COLONOSCOPY WITH PROPOFOL N/A 02/13/2018   Procedure: COLONOSCOPY WITH PROPOFOL;  Surgeon: Manya Silvas, MD;  Location: [facility] ENDOSCOPY;  Service: Endoscopy;  Laterality: N/A;  . CT of the head  08/22/2014   [facility]. Normal  . DOPPLER ECHOCARDIOGRAPHY  08/29/2014   EF=60-65%. Normal LVEF  . Myocardial Perfusion Scan  09/10/2012   Poor exercsie tolerance, but no evidence of stress induced myocardial ischemia. EF=66%. Dr. Clayborn Bigness  . NASAL SEPTOPLASTY W/ TURBINOPLASTY Bilateral 02/10/2016   Procedure: NASAL SEPTOPLASTY WITH INFERIOR TURBINATE REDUCTION;  Surgeon: Carloyn Manner, MD;  Location: [facility] ORS;  Service: ENT;  Laterality: Bilateral;  . pannectomy    . TEMPORAL ARTERY BIOPSY / LIGATION Left     Social History   Tobacco Use  . Smoking status: Never Smoker  . Smokeless tobacco: Never Used  Substance Use Topics  . Alcohol use: Yes    Alcohol/week: 0.0 standard drinks    Comment: Rare - 1 drink  . Drug use: No    Family History  Problem Relation Age of Onset  . Colon cancer Mother   . Coronary  artery disease Mother   . Prostate cancer Father   . Kidney disease Father   . Dementia Brother   . Diabetes Neg Hx      . buPROPion  150 mg Oral Daily  . cholecalciferol  5,000 Units Oral Daily  . citalopram  40 mg Oral Daily  . folic acid  1 mg Oral Q lunch  . gabapentin  600 mg Oral q morning - 10a   And  . gabapentin  900 mg Oral QHS  . glipiZIDE  2.5 mg Oral Daily  . heparin  5,000 Units Subcutaneous Q8H  . insulin aspart  0-9 Units Subcutaneous TID WC  . mesalamine  2.4 g Oral BID  . metFORMIN  1,000 mg Oral BID  . pravastatin  40 mg Oral Q lunch  . predniSONE  5 mg Oral QODAY      Abtx:  Anti-infectives (From admission, onward)   Start     Dose/Rate Route Frequency Ordered Stop   08/01/18 1200  Ampicillin-Sulbactam  (UNASYN) 3 g in sodium chloride 0.9 % 100 mL IVPB     3 g 200 mL/hr over 30 Minutes Intravenous Every 6 hours 08/01/18 1124     08/01/18 0000  piperacillin-tazobactam (ZOSYN) IVPB 3.375 g  Status:  Discontinued     3.375 g 12.5 mL/hr over 240 Minutes Intravenous Every 8 hours 07/31/18 2100 08/01/18 1113   07/31/18 1900  vancomycin (VANCOCIN) IVPB 1000 mg/200 mL premix     1,000 mg 200 mL/hr over 60 Minutes Intravenous  Once 07/31/18 1857 07/31/18 2021   07/31/18 1900  piperacillin-tazobactam (ZOSYN) IVPB 3.375 g     3.375 g 100 mL/hr over 30 Minutes Intravenous  Once 07/31/18 1857 07/31/18 1930       Review of Systems: Review of Systems  Constitutional: Positive for chills, diaphoresis and fever. Negative for malaise/fatigue and weight loss.  HENT: Negative for congestion, ear pain, hearing loss and sore throat.   Eyes: Negative for blurred vision and photophobia.  Respiratory: Negative for cough and shortness of breath.   Cardiovascular: Positive for leg swelling. Negative for chest pain, palpitations and orthopnea.  Gastrointestinal: Negative for abdominal pain, heartburn, nausea and vomiting.  Genitourinary: Negative for dysuria and urgency.  Musculoskeletal: Positive for joint pain (rt knee pain.) and myalgias.  Skin: Positive for rash.  Neurological: Positive for weakness. Negative for dizziness and headaches.  Endo/Heme/Allergies: Bruises/bleeds easily.  Psychiatric/Behavioral: Positive for depression (well controlled on meds.). The patient is not nervous/anxious.       OBJECTIVE: Blood pressure 117/67, pulse 83, temperature 98.3 F (36.8 C), temperature source Oral, resp. rate 17, height 5' 11"  (1.803 m), weight (!) 152 kg, SpO2 94 %.  Physical Exam  Constitutional: He is oriented to person, place, and time. He appears well-developed. No distress.  HENT:  Head: Normocephalic.  Mouth/Throat: Oropharynx is clear and moist.  Eyes: Pupils are equal, round, and reactive to  light.  Neck: Normal range of motion. Neck supple.  Cardiovascular: Normal rate, regular rhythm and normal heart sounds.  No murmur heard. Pulmonary/Chest: Effort normal and breath sounds normal. He has no rales.  Abdominal: Soft. Bowel sounds are normal. He exhibits no distension. There is no tenderness.  Musculoskeletal: Normal range of motion. He exhibits edema (rt leg more swollen than left- erythematous mid to lower third of the leg in a cicumferentilal location).  Neurological: He is alert and oriented to person, place, and time. No cranial nerve deficit.  Skin: Skin is warm.  Psychiatric: He has a normal mood and affect.  edema both legs    Lab Results CBC    Component Value Date/Time   WBC 15.7 (H) 08/01/2018 0522   RBC 4.37 (L) 08/01/2018 0522   HGB 12.5 (L) 08/01/2018 0522   HGB 14.0 05/22/2018 1547   HCT 37.8 (L) 08/01/2018 0522   HCT 41.9 05/22/2018 1547   PLT 201 08/01/2018 0522   PLT 247 05/22/2018 1547   MCV 86.4 08/01/2018 0522   MCV 85 05/22/2018 1547   MCV 87 08/30/2014 0450   MCH 28.6 08/01/2018 0522   MCHC 33.1 08/01/2018 0522   RDW 15.4 (H) 08/01/2018 0522   RDW 14.9 05/22/2018 1547   RDW 15.0 (H) 08/30/2014 0450   LYMPHSABS 0.8 (L) 07/31/2018 1746   LYMPHSABS 0.9 (L) 08/30/2014 0450   MONOABS 0.4 07/31/2018 1746   MONOABS 0.1 (L) 08/30/2014 0450   EOSABS 0.1 07/31/2018 1746   EOSABS 0.0 08/30/2014 0450   BASOSABS 0.1 07/31/2018 1746   BASOSABS 0.0 08/30/2014 0450    CMP Latest Ref Rng & Units 08/01/2018 07/31/2018 05/22/2018  Glucose 70 - 99 mg/dL 185(H) 202(H) 150(H)  BUN 8 - 23 mg/dL 15 14 18   Creatinine 0.61 - 1.24 mg/dL 1.04 1.02 0.90  Sodium 135 - 145 mmol/L 137 137 143  Potassium 3.5 - 5.1 mmol/L 3.7 4.7 4.4  Chloride 98 - 111 mmol/L 100 102 101  CO2 22 - 32 mmol/L 30 27 28   Calcium 8.9 - 10.3 mg/dL 8.0(L) 8.8(L) 9.2  Total Protein 6.5 - 8.1 g/dL - 6.7 6.3  Total Bilirubin 0.3 - 1.2 mg/dL - 0.8 0.3  Alkaline Phos 38 - 126 U/L - 78 91    AST 15 - 41 U/L - 31 14  ALT 0 - 44 U/L - 19 17      Microbiology: Recent Results (from the past 240 hour(s))  Culture, blood (Routine x 2)     Status: None (Preliminary result)   Collection Time: 07/31/18  5:46 PM  Result Value Ref Range Status   Specimen Description   Final    BLOOD BLOOD LEFT HAND Performed at [facility], [address] [place], [place] [address]    Special Requests   Final    BOTTLES DRAWN AEROBIC AND ANAEROBIC Blood Culture adequate volume Performed at [facility], [address] [place], [place] [address]    Culture  Setup Time   Final    GRAM NEGATIVE RODS IN BOTH AEROBIC AND ANAEROBIC BOTTLES CRITICAL RESULT CALLED TO, READ BACK BY AND VERIFIED WITH: KAREN HAYES AT 7408 08/01/18 SDR Performed at [facility], [place] [address] [place], [place] [address]    Culture GRAM NEGATIVE RODS  Final   Report Status PENDING  Incomplete  Blood Culture ID Panel (Reflexed)     Status: None   Collection Time: 07/31/18  5:46 PM  Result Value Ref Range Status   Enterococcus species NOT DETECTED NOT DETECTED Final   Listeria monocytogenes NOT DETECTED NOT DETECTED Final   Staphylococcus species NOT DETECTED NOT DETECTED Final   Staphylococcus aureus NOT DETECTED NOT DETECTED Final   Streptococcus species NOT DETECTED NOT DETECTED Final   Streptococcus agalactiae NOT DETECTED NOT DETECTED Final   Streptococcus pneumoniae NOT DETECTED NOT DETECTED Final   Streptococcus pyogenes NOT DETECTED NOT DETECTED Final   Acinetobacter baumannii NOT DETECTED NOT DETECTED Final   Enterobacteriaceae species NOT DETECTED NOT DETECTED Final   Enterobacter cloacae complex NOT DETECTED  NOT DETECTED Final   Escherichia coli NOT DETECTED NOT DETECTED Final   Klebsiella oxytoca NOT DETECTED NOT DETECTED Final   Klebsiella pneumoniae NOT DETECTED NOT DETECTED Final   Proteus species NOT DETECTED NOT DETECTED Final   Serratia marcescens NOT DETECTED NOT  DETECTED Final   Haemophilus influenzae NOT DETECTED NOT DETECTED Final   Neisseria meningitidis NOT DETECTED NOT DETECTED Final   Pseudomonas aeruginosa NOT DETECTED NOT DETECTED Final   Candida albicans NOT DETECTED NOT DETECTED Final   Candida glabrata NOT DETECTED NOT DETECTED Final   Candida krusei NOT DETECTED NOT DETECTED Final   Candida parapsilosis NOT DETECTED NOT DETECTED Final   Candida tropicalis NOT DETECTED NOT DETECTED Final    Comment: Performed at [facility], [address] [place], [place] [address]  Culture, blood (Routine x 2)     Status: None (Preliminary result)   Collection Time: 07/31/18  7:30 PM  Result Value Ref Range Status   Specimen Description BLOOD LEFT ANTECUBITAL  Final   Special Requests   Final    BOTTLES DRAWN AEROBIC AND ANAEROBIC Blood Culture adequate volume   Culture   Final    NO GROWTH < 12 HOURS Performed at [facility] [address] [place], [place] [address]    Report Status PENDING  Incomplete    No radiological images to review.   Assessment and Plan 67 y.o. male history of diabetes mellitus, bilateral lymphedema of the legs, temporal arteritis, colitis presents with swelling of the right leg, pain and redness with fever following a cat bite sustained on Tuesday.  Patient states his cat who is declawed jumped on his trousers playfully when he was walking and then slid down and in the process nipped  him on his right leg.  This happened on Tuesday afternoon.  That leg was swelling that evening and the next day it was very painful to walk and was red and he was having fever.  He came to the emergency room on Wednesday evening around 5.30pm.  Cat bite leading to cellulitis of the right leg and gram-negative bacteremia with sepsis.  Patient has underlying bilateral lymphedema and hence at risk for easily developing cellulitis.  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 coli, Klebsiella, Enterobacter and Proteus and Serratia. Patient is on Zosyn which can be de-escalated to Unasyn. He will need IV antibiotics for at least 14 days because of the bacteremia. We will repeat blood cultures to confirm eradication of bacteremia. Other organisms that can be responsible for the cellulitis include group A streptococcus.  Diabetes mellitus on metformin and glipizide.  Temporal arteritis on low-dose steroids.  History of prostate CA status post radiation seeds.,  Ulcerative colitis  on mesalamine. Depression on Wellbutrin, Celexa  Discussed the management with the patient and his wife in great detail.

## 2018-08-02 ENCOUNTER — Inpatient Hospital Stay: Payer: Self-pay

## 2018-08-02 LAB — BASIC METABOLIC PANEL
Anion gap: 5 (ref 5–15)
BUN: 13 mg/dL (ref 8–23)
CALCIUM: 8.1 mg/dL — AB (ref 8.9–10.3)
CO2: 31 mmol/L (ref 22–32)
Chloride: 102 mmol/L (ref 98–111)
Creatinine, Ser: 0.85 mg/dL (ref 0.61–1.24)
GFR calc Af Amer: >60 mL/min (ref >60)
GLUCOSE: 132 mg/dL — AB (ref 70–99)
Potassium: 3.6 mmol/L (ref 3.5–5.1)
Sodium: 138 mmol/L (ref 135–145)

## 2018-08-02 LAB — GLUCOSE, CAPILLARY
GLUCOSE-CAPILLARY: 159 mg/dL — AB (ref 70–99)
GLUCOSE-CAPILLARY: 167 mg/dL — AB (ref 70–99)
GLUCOSE-CAPILLARY: 143 mg/dL — AB (ref 70–99)
Glucose-Capillary: 147 mg/dL — ABNORMAL HIGH (ref 70–99)

## 2018-08-02 LAB — CULTURE, BLOOD (ROUTINE X 2): SPECIAL REQUESTS: ADEQUATE

## 2018-08-02 LAB — CBC
HCT: 37 % — ABNORMAL LOW (ref 40.0–52.0)
Hemoglobin: 12.4 g/dL — ABNORMAL LOW (ref 13.0–18.0)
MCH: 28.9 pg (ref 26.0–34.0)
MCHC: 33.6 g/dL (ref 32.0–36.0)
MCV: 86 fL (ref 80.0–100.0)
PLATELETS: 179 10*3/uL (ref 150–440)
RBC: 4.3 MIL/uL — ABNORMAL LOW (ref 4.40–5.90)
RDW: 14.8 % — AB (ref 11.5–14.5)
WBC: 10.9 10*3/uL — ABNORMAL HIGH (ref 3.8–10.6)

## 2018-08-02 LAB — HIV ANTIBODY (ROUTINE TESTING W REFLEX): HIV Screen 4th Generation wRfx: NONREACTIVE

## 2018-08-02 MED ORDER — TRAMADOL HCL 50 MG PO TABS: 50.0000 mg | ORAL_TABLET | Freq: Four times a day (QID) | ORAL | Status: DC | PRN

## 2018-08-02 MED ORDER — TETANUS-DIPHTH-ACELL PERTUSSIS 5-2.5-18.5 LF-MCG/0.5 IM SUSP
0.5000 mL | Freq: Once | INTRAMUSCULAR | Status: AC
  Administered 2018-08-02: 0.5 mL via INTRAMUSCULAR
  Filled 2018-08-02: qty 0.5

## 2018-08-02 NOTE — Progress Notes (Addendum)
[facility] at [place] NAME: Edward King    MR#:  [id number]  DATE OF BIRTH:  1951/08/20  SUBJECTIVE:  CHIEF COMPLAINT: Patient is feeling better right leg pain better.  Wife at bedside.  REVIEW OF SYSTEMS:  CONSTITUTIONAL: No fever, fatigue or weakness.  EYES: No blurred or double vision.  EARS, NOSE, AND THROAT: No tinnitus or ear pain.  RESPIRATORY: No cough, shortness of breath, wheezing or hemoptysis.  CARDIOVASCULAR: No chest pain, orthopnea, edema.  GASTROINTESTINAL: No nausea, vomiting, diarrhea or abdominal pain.  GENITOURINARY: No dysuria, hematuria.  ENDOCRINE: No polyuria, nocturia,  HEMATOLOGY: No anemia, easy bruising or bleeding SKIN: Right leg-red and painful  mUSCULOSKELETAL: No joint pain or arthritis.   NEUROLOGIC: No tingling, numbness, weakness.  PSYCHIATRY: No anxiety or depression.   DRUG ALLERGIES:  No Known Allergies  VITALS:  Blood pressure 126/66, pulse 82, temperature 98.4 F (36.9 C), temperature source Oral, resp. rate 17, height 5' 11"  (1.803 m), weight (!) 152 kg, SpO2 97 %.  PHYSICAL EXAMINATION:  GENERAL:  67 y.o.-year-old patient lying in the bed with no acute distress.  EYES: Pupils equal, round, reactive to light and accommodation. No scleral icterus. Extraocular muscles intact.  HEENT: Head atraumatic, normocephalic. Oropharynx and nasopharynx clear.  NECK:  Supple, no jugular venous distention. No thyroid enlargement, no tenderness.  LUNGS: Normal breath sounds bilaterally, no wheezing, rales,rhonchi or crepitation. No use of accessory muscles of respiration.  CARDIOVASCULAR: S1, S2 normal. No murmurs, rubs, or gallops.  ABDOMEN: Soft, nontender, nondistended. Bowel sounds present. No organomegaly or mass.  EXTREMITIES: No pedal edema, cyanosis, or clubbing.  NEUROLOGIC: Cranial nerves II through XII are intact. Muscle strength 5/5 in all extremities. Sensation intact. Gait not checked.   PSYCHIATRIC: The patient is alert and oriented x 3.  SKIN: Right leg is erythematous, edematous tender.  Pulses are present on dorsalis pedis on the right side, no inguinal lymphadenopathy on the right side    LABORATORY PANEL:   CBC Recent Labs  Lab 08/02/18 0423  WBC 10.9*  HGB 12.4*  HCT 37.0*  PLT 179   ------------------------------------------------------------------------------------------------------------------  Chemistries  Recent Labs  Lab 07/31/18 1746  08/02/18 0423  NA 137   < > 138  K 4.7   < > 3.6  CL 102   < > 102  CO2 27   < > 31  GLUCOSE 202*   < > 132*  BUN 14   < > 13  CREATININE 1.02   < > 0.85  CALCIUM 8.8*   < > 8.1*  AST 31  --   --   ALT 19  --   --   ALKPHOS 78  --   --   BILITOT 0.8  --   --    < > = values in this interval not displayed.   ------------------------------------------------------------------------------------------------------------------  Cardiac Enzymes No results for input(s): TROPONINI in the last 168 hours. ------------------------------------------------------------------------------------------------------------------  RADIOLOGY:  No results found.  EKG:   Orders placed or performed during the hospital encounter of 07/31/18  . ED EKG 12-Lead  . ED EKG 12-Lead  . EKG 12-Lead  . EKG 12-Lead    ASSESSMENT AND PLAN:   * Sepsis Due to cat bite and cellulitis IV vancomycin and Zosyn narrowed down to IV Unasyn  1 set of blood cultures from August 14 with gram-negative rods repeat blood cultures from August 16 are pending Monitor the cultures. Follow-up with infectious disease, recommending  IV antibiotics for at least 14 days given the bacteremia Tetanus  if not given so far PICC line if rpt blood cx are neg, might order tomorrow depending on cx results, d/w ID  *Lactic acidosis Trending down.  2.3-1.3  *Neuropathy secondary to diabetes Continue gabapentin.  *Diabetes Continue oral medications and  keep on sliding scale coverage.  *Hyperlipidemia Continue statin.  *Generalized weakness PT consult   All the records are reviewed and case discussed with Care Management/Social Workerr. Management plans discussed with the patient, family and they are in agreement.  CODE STATUS:  fc   TOTAL TIME TAKING CARE OF THIS PATIENT: 36  minutes.   POSSIBLE D/C IN 2-3 DAYS, DEPENDING ON CLINICAL CONDITION.  Note: This dictation was prepared with Dragon dictation along with smaller phrase technology. Any transcriptional errors that result from this process are unintentional.   [name] M.D on 08/02/2018 at 2:36 PM  Between 7am to 6pm - Pager - [phone] After 6pm go to [email or web address] - password [email or web address] [facility]  Office  [phone]  CC: Primary care physician; Birdie Sons, MD

## 2018-08-02 NOTE — Progress Notes (Signed)
PHARMACY CONSULT NOTE FOR:  OUTPATIENT  PARENTERAL ANTIBIOTIC THERAPY (OPAT)  Indication: pasteurella bacteremia Regimen: ampicillin/sulbactam 3gm IV q6h End date: 08/15/2018  IV antibiotic discharge orders are pended. To discharging provider:  please sign these orders via discharge navigator,  Select New Orders & click on the button choice - Manage This Unsigned Work.     Thank you for allowing pharmacy to be a part of this patient's care.  Doreene Eland, PharmD, BCPS.   Work Cell: [phone] 08/02/2018 4:12 PM

## 2018-08-02 NOTE — Progress Notes (Signed)
Pharmacy Antibiotic Note  Edward King is a 67 y.o. male admitted on 07/31/2018 with a cat bite/cellulitis.  Pharmacy has been consulted for Unasyn dosing. He was originally started on vancomycin and Zosyn. However BCID returned 2/4 GNR this morning with no identification. Correlating with clinical picture pasteurella is suspected.  Plan: Day 2- Unasyn 3 grams IV every 6 hours  Height: 5' 11"  (180.3 cm) Weight: (!) 335 lb (152 kg) IBW/kg (Calculated) : 75.3  Temp (24hrs), Avg:98.2 F (36.8 C), Min:98.1 F (36.7 C), Max:98.3 F (36.8 C)  Recent Labs  Lab 07/31/18 1746 07/31/18 1930 08/01/18 0522 08/02/18 0423  WBC 16.1*  --  15.7* 10.9*  CREATININE 1.02  --  1.04 0.85  LATICACIDVEN 3.2* 2.3* 1.3  --     Estimated Creatinine Clearance: 126.4 mL/min (by C-G formula based on SCr of 0.85 mg/dL).    No Known Allergies  Antimicrobials this admission: Vanc x 1 in ER  8/14 >>   Zosyn 8/14 >> 8/15 Unasyn 8/15>>  Microbiology results:  8/14 BCx: 2/4 GNR   Thank you for allowing pharmacy to be a part of this patient's care.  Kaegan Stigler A, PharmD 08/02/2018 9:39 AM

## 2018-08-02 NOTE — Progress Notes (Signed)
Cat Bite with cellulitis of left leg and gram neg bacteremia Pt feeling better Leg still hurting especially on standing or walking No fever No chills appetite good   OBJECTIVE:  BP 126/66 (BP Location: Right Arm)   Pulse 82   Temp 98.4 F (36.9 C) (Oral)   Resp 17   Ht 5' 11"  (1.803 m)   Wt (!) 152 kg   SpO2 97%   BMI 46.72 kg/m   Physical Exam  Constitutional: looks well Head: Normocephalic.  Mouth/Throat: Oropharynx is clear and moist.  Eyes: Pupils are equal, round, and reactive to light.  Neck: Normal range of motion. Neck supple.  Cardiovascular: Normal rate, regular rhythm and normal heart sounds.  No murmur heard.  Pulmonary/Chest: Effort normal and breath sounds normal. He has no rales.  Abdominal: Soft. Bowel sounds are normal. He exhibits no distension. There is no tenderness.  Musculoskeletal: Normal range of motion.  He exhibits edema (rt leg more swollen than left- erythematous mid to lower third of the leg in a cicumferentilal location).    Today erythema is much better Neurological: He is alert and oriented to person, place, and time. No cranial nerve deficit.  Skin: Skin is warm.  Psychiatric: He has a normal mood and affect.  edema both legs    Lab Results   CBC Latest Ref Rng & Units 08/02/2018 08/01/2018 07/31/2018  WBC 3.8 - 10.6 K/uL 10.9(H) 15.7(H) 16.1(H)  Hemoglobin 13.0 - 18.0 g/dL 12.4(L) 12.5(L) 14.3  Hematocrit 40.0 - 52.0 % 37.0(L) 37.8(L) 42.6  Platelets 150 - 440 K/uL 179 201 239   CMP Latest Ref Rng & Units 08/02/2018 08/01/2018 07/31/2018  Glucose 70 - 99 mg/dL 132(H) 185(H) 202(H)  BUN 8 - 23 mg/dL 13 15 14   Creatinine 0.61 - 1.24 mg/dL 0.85 1.04 1.02  Sodium 135 - 145 mmol/L 138 137 137  Potassium 3.5 - 5.1 mmol/L 3.6 3.7 4.7  Chloride 98 - 111 mmol/L 102 100 102  CO2 22 - 32 mmol/L 31 30 27   Calcium 8.9 - 10.3 mg/dL 8.1(L) 8.0(L) 8.8(L)  Total Protein 6.5 - 8.1 g/dL - - 6.7  Total Bilirubin 0.3 - 1.2 mg/dL - - 0.8  Alkaline Phos  38 - 126 U/L - - 78  AST 15 - 41 U/L - - 31  ALT 0 - 44 U/L - - 19   8/14 BC- pasteurella multocida  Assessment and Plan  67 y.o. male history of diabetes mellitus, bilateral lymphedema of the legs, temporal arteritis, colitis presents with swelling of the right leg, pain and redness with fever following a cat bite sustained on Tuesday. Patient states his cat who is declawed jumped on his trousers playfully when he was walking and then slid down and in the process nipped him on his right leg. This happened on Tuesday afternoon. That leg was swelling that evening and the next day it was very painful to walk and was red and he was having fever. He came to the emergency room on Wednesday evening around 5.30pm.    Cat bite leading to cellulitis of the right leg and pasteurella bacteremia with sepsis. Improving on unasyn 3 grams IV q6 Patient has underlying bilateral lymphedema  He will need IV antibiotics for at least 14 days because of the bacteremia. Last date of antibiotic will be on 08/15/18/ repeat blood cultures to confirm eradication of bacteremia. If negative tomorrow he can get PICC  Diabetes mellitus on metformin and glipizide.   Temporal arteritis on low-dose  steroids.   History of prostate CA status post radiation seeds.,   Ulcerative colitis on mesalamine.   Depression on Wellbutrin, Celexa  Discussed the management with the patient and his wife in great detail. Discussed with hospitalist and OPAT orders written ID will follow him peripherally this weekend-call if needed

## 2018-08-02 NOTE — Progress Notes (Signed)
Diagnosis: Pasteurella multocida bacteremia due to cat bite Baseline Creatinine 0.85  Culture Result: as above  No Known Allergies  OPAT Orders Discharge antibiotics: Unasyn 3 grams IV every 6 hours until 08/15/18   [facility] Care Per Protocol:  Labs weekly while on IV antibiotics: X__ CBC with differential __ BMP _X_ CMP __ CRP __ ESR __ Vancomycin trough   Please pull PIC at completion of IV antibiotics  Scan results and email to [email or web address] If you have any questions or have critical lab please call [phone]  Clinic Follow Up Appt: I will call patient and give appt if needed  Tsosie Billing MD

## 2018-08-02 NOTE — Care Management Important Message (Signed)
Copy of signed IM left with patient in room.  

## 2018-08-02 NOTE — Progress Notes (Signed)
Per IV team need approval from ID to place PICC line. Per Dr Margaretmary Eddy ID would like to wait until 08/03/18 to place PICC line. IV team RN aware.

## 2018-08-02 NOTE — Care Management (Signed)
PT eval pending.  Edward King with [facility] confirmed they will be able to accept the patient for home health services.

## 2018-08-02 NOTE — Care Management (Signed)
Patient admitted from home with Sepsis Due tocat bite and cellulitis. Patient lives at home with wife.  PCP [facility].  Patient denies issues obtaining medications or with transportation.  Only medical equipment in the home is a single prong cane. RNCM requested PT consult from MD.  Patient states that he has been having trouble with his knee, and not sure that he will be able to get around.  Per ID MD note patient will require home IV antibiotics at discharge. Patient and wife state they do not have a preference of home health agency.  Referral made to [facility] with [place].  They will be able to provide the antibiotics, however RNCM is a awaiting determination if they can accept the patient for home health.

## 2018-08-03 ENCOUNTER — Inpatient Hospital Stay: Payer: Self-pay

## 2018-08-03 LAB — GLUCOSE, CAPILLARY
Glucose-Capillary: 133 mg/dL — ABNORMAL HIGH (ref 70–99)
Glucose-Capillary: 209 mg/dL — ABNORMAL HIGH (ref 70–99)
Glucose-Capillary: 142 mg/dL — ABNORMAL HIGH (ref 70–99)
Glucose-Capillary: 163 mg/dL — ABNORMAL HIGH (ref 70–99)

## 2018-08-03 MED ORDER — LIDOCAINE HCL 1 % IJ SOLN
5.0000 mL | Freq: Once | INTRAMUSCULAR | Status: AC
  Administered 2018-08-03: 5 mL via INTRADERMAL
  Filled 2018-08-03: qty 5

## 2018-08-03 MED ORDER — ZOLPIDEM TARTRATE 5 MG PO TABS
5.0000 mg | ORAL_TABLET | Freq: Every evening | ORAL | Status: DC | PRN
  Administered 2018-08-03 – 2018-08-04 (×2): 5 mg via ORAL
  Filled 2018-08-03 (×2): qty 1

## 2018-08-03 NOTE — Progress Notes (Signed)
[facility] [place] NAME: Edward King    MR#:  [id number]  DATE OF BIRTH:  08-Sep-1951  SUBJECTIVE:  CHIEF COMPLAINT: Patient is feeling better right leg pain, redness and swelling or much better.  Repeat blood cultures negative  REVIEW OF SYSTEMS:  CONSTITUTIONAL: No fever, fatigue or weakness.  EYES: No blurred or double vision.  EARS, NOSE, AND THROAT: No tinnitus or ear pain.  RESPIRATORY: No cough, shortness of breath, wheezing or hemoptysis.  CARDIOVASCULAR: No chest pain, orthopnea, edema.  GASTROINTESTINAL: No nausea, vomiting, diarrhea or abdominal pain.  GENITOURINARY: No dysuria, hematuria.  ENDOCRINE: No polyuria, nocturia,  HEMATOLOGY: No anemia, easy bruising or bleeding SKIN: Right leg-red and painful  mUSCULOSKELETAL: No joint pain or arthritis.   NEUROLOGIC: No tingling, numbness, weakness.  PSYCHIATRY: No anxiety or depression.   DRUG ALLERGIES:  No Known Allergies  VITALS:  Blood pressure (!) 143/67, pulse 83, temperature 98.9 F (37.2 C), temperature source Oral, resp. rate 20, height 5' 11"  (1.803 m), weight (!) 152 kg, SpO2 97 %.  PHYSICAL EXAMINATION:  GENERAL:  67 y.o.-year-old patient lying in the bed with no acute distress.  EYES: Pupils equal, round, reactive to light and accommodation. No scleral icterus. Extraocular muscles intact.  HEENT: Head atraumatic, normocephalic. Oropharynx and nasopharynx clear.  NECK:  Supple, no jugular venous distention. No thyroid enlargement, no tenderness.  LUNGS: Normal breath sounds bilaterally, no wheezing, rales,rhonchi or crepitation. No use of accessory muscles of respiration.  CARDIOVASCULAR: S1, S2 normal. No murmurs, rubs, or gallops.  ABDOMEN: Soft, nontender, nondistended. Bowel sounds present. No organomegaly or mass.  EXTREMITIES: No pedal edema, cyanosis, or clubbing.  NEUROLOGIC: Cranial nerves II through XII are intact. Muscle strength 5/5 in all  extremities. Sensation intact. Gait not checked.  PSYCHIATRIC: The patient is alert and oriented x 3.  SKIN: Right lower leg less erythematous and minimal tenderness  Pulses are present on dorsalis pedis on the right side, no inguinal lymphadenopathy on the right side    LABORATORY PANEL:   CBC Recent Labs  Lab 08/02/18 0423  WBC 10.9*  HGB 12.4*  HCT 37.0*  PLT 179   ------------------------------------------------------------------------------------------------------------------  Chemistries  Recent Labs  Lab 07/31/18 1746  08/02/18 0423  NA 137   < > 138  K 4.7   < > 3.6  CL 102   < > 102  CO2 27   < > 31  GLUCOSE 202*   < > 132*  BUN 14   < > 13  CREATININE 1.02   < > 0.85  CALCIUM 8.8*   < > 8.1*  AST 31  --   --   ALT 19  --   --   ALKPHOS 78  --   --   BILITOT 0.8  --   --    < > = values in this interval not displayed.   ------------------------------------------------------------------------------------------------------------------  Cardiac Enzymes No results for input(s): TROPONINI in the last 168 hours. ------------------------------------------------------------------------------------------------------------------  RADIOLOGY:  [place] Ekg Site Rite  Result Date: 08/03/2018 If Site Rite image not attached, placement could not be confirmed due to current cardiac rhythm.  [place] Ekg Site Rite  Result Date: 08/02/2018 If Site Rite image not attached, placement could not be confirmed due to current cardiac rhythm.   EKG:   Orders placed or performed during the hospital encounter of 07/31/18  . ED EKG 12-Lead  . ED EKG 12-Lead  . EKG 12-Lead  .  EKG 12-Lead    ASSESSMENT AND PLAN:   * Sepsis Due to cat bite and cellulitis IV vancomycin and Zosyn narrowed down to IV Unasyn  1 set of blood cultures from August 14 with gram-negative rods Pasteurella multocida repeat blood cultures from August 16 are negative Okay to order PICC line Follow-up with  infectious disease, recommending IV antibiotics Unasyn every 6 hours for at least 14 days given the bacteremia Tetanus   Improving leukocytosis almost back to normal Appreciate ID recommendations  *Lactic acidosis Trending down.  2.3-1.3  *Neuropathy secondary to diabetes Continue gabapentin.  *Diabetes Continue oral medications and keep on sliding scale coverage.  *Hyperlipidemia Continue statin.  *Generalized weakness PT consult-Home health PT   All the records are reviewed and case discussed with Care Management/Social Workerr. Management plans discussed with the patient, family and they are in agreement.  CODE STATUS:  fc   TOTAL TIME TAKING CARE OF THIS PATIENT: 36  minutes.   POSSIBLE D/C IN 1 DAYS, DEPENDING ON CLINICAL CONDITION.  Note: This dictation was prepared with Dragon dictation along with smaller phrase technology. Any transcriptional errors that result from this process are unintentional.   Edward King M.D on 08/03/2018 at 12:37 PM  Between 7am to 6pm - Pager - [phone] After 6pm go to [email or web address] - password [email or web address] [facility]  Office  [phone]  CC: Primary care physician; Birdie Sons, MD

## 2018-08-03 NOTE — Progress Notes (Addendum)
Per Dr Margaretmary Eddy ok to place PICC today. Spoke with [facility], requested PICC line placement. Awaiting call back with ETA. Leigh CN aware.

## 2018-08-03 NOTE — Progress Notes (Signed)
Spoke with Kathlee Nations RN re PICC line.  If pt to be d/c this weekend, RN to call [facility] for PICC placement.  If pt not to leave until Monday or after, our IV Team will place PICC line.

## 2018-08-03 NOTE — Evaluation (Signed)
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  For this reason HHPT requested and is awaiting a PICC line which would involve nursing as well.  He is able to walk and will likely need a new RW, along with family to supervise him for safety and to monitor R knee on uneven ground.  Follow acutely to strengthen and work on standing balance and endurance toward the same goals.    Follow Up Recommendations Home health PT;Supervision for mobility/OOB    Equipment Recommendations  Rolling walker with 5" wheels    Recommendations for Other Services       Precautions / Restrictions Precautions Precautions: Fall Precaution Comments: unsteady R knee per pt Restrictions Weight Bearing Restrictions: No      Mobility  Bed Mobility Overal bed mobility: Modified Independent             General bed mobility comments: HOB elevated to ease transition to side of bed  Transfers Overall transfer level: Modified independent Equipment used: Rolling walker (2 wheeled)             General transfer comment: slow progression and then steadied himself on walker  Ambulation/Gait Ambulation/Gait assistance: Min guard Gait Distance (Feet): 150 Feet Assistive device: Rolling walker (2 wheeled) Gait Pattern/deviations: Step-through pattern;Decreased stride length;Decreased stance time - right;Decreased weight shift to right;Antalgic;Wide base of support Gait velocity: reduced Gait velocity interpretation: <1.31 ft/sec, indicative of household ambulator General Gait Details: careful  transfer to RLE with RW to support, cautioned pt not to let go of walker to talk during gait  Stairs            Wheelchair Mobility    Modified Rankin (Stroke Patients Only)       Balance Overall balance assessment: Needs assistance Sitting-balance support: Feet supported Sitting balance-Leahy Scale: Good     Standing balance support: Bilateral upper extremity supported;During functional activity Standing balance-Leahy Scale: Fair Standing balance comment: less than fair dynamically at times with R knee being unsteady                             Pertinent Vitals/Pain Pain Assessment: Faces Faces Pain Scale: Hurts even more Pain Location: R lower leg with mobility Pain Descriptors / Indicators: Tender Pain Intervention(s): Monitored during session;Premedicated before session;Limited activity within patient's tolerance;Repositioned    Home Living Family/patient expects to be discharged to:: Private residence Living Arrangements: Spouse/significant other Available Help at Discharge: Family(wife and daughter) Type of Home: House Home Access: Ramped entrance     Home Layout: One level Home Equipment: Cane - single point Additional Comments: has used cane in past for support of gait    Prior Function Level of Independence: Independent with assistive device(s)               Hand Dominance   Dominant Hand: Right    Extremity/Trunk Assessment   Upper Extremity Assessment Upper Extremity Assessment: Overall WFL for tasks assessed    Lower Extremity Assessment Lower Extremity Assessment: RLE deficits/detail RLE Deficits / Details: R knee  is 4+ ext and 4- flexion RLE Coordination: decreased gross motor;decreased fine motor    Cervical / Trunk Assessment Cervical / Trunk Assessment: Normal  Communication   Communication: No difficulties  Cognition Arousal/Alertness: Awake/alert Behavior During Therapy: WFL for tasks  assessed/performed Overall Cognitive Status: Within Functional Limits for tasks assessed                                        General Comments      Exercises     Assessment/Plan    PT Assessment Patient needs continued PT services  PT Problem List Decreased strength;Decreased range of motion;Decreased activity tolerance;Decreased balance;Decreased mobility;Decreased coordination;Decreased knowledge of use of DME;Decreased safety awareness;Obesity;Decreased skin integrity;Pain       PT Treatment Interventions DME instruction;Gait training;Functional mobility training;Therapeutic activities;Therapeutic exercise;Balance training;Neuromuscular re-education;Patient/family education    PT Goals (Current goals can be found in the Care Plan section)  Acute Rehab PT Goals Patient Stated Goal: to walk and get home  PT Goal Formulation: With patient Time For Goal Achievement: 08/17/18 Potential to Achieve Goals: Good    Frequency Min 2X/week   Barriers to discharge Inaccessible home environment;Decreased caregiver support wife is caring for his daughter who had recent surgery    Co-evaluation               AM-PAC PT "6 Clicks" Daily Activity  Outcome Measure Difficulty turning over in bed (including adjusting bedclothes, sheets and blankets)?: A Little Difficulty moving from lying on back to sitting on the side of the bed? : Unable Difficulty sitting down on and standing up from a chair with arms (e.g., wheelchair, bedside commode, etc,.)?: Unable Help needed moving to and from a bed to chair (including a wheelchair)?: A Little Help needed walking in hospital room?: A Little Help needed climbing 3-5 steps with a railing? : A Lot 6 Click Score: 13    End of Session Equipment Utilized During Treatment: Gait belt Activity Tolerance: Patient tolerated treatment well;Patient limited by pain Patient left: in bed;with call bell/phone within reach Nurse  Communication: Mobility status PT Visit Diagnosis: Unsteadiness on feet (R26.81);Muscle weakness (generalized) (M62.81);Other abnormalities of gait and mobility (R26.89);Pain Pain - Right/Left: Right Pain - part of body: Knee    Time: 4239-5320 PT Time Calculation (min) (ACUTE ONLY): 22 min   Charges:   PT Evaluation $PT Eval Moderate Complexity: 1 Mod          Ramond Dial 08/03/2018, 1:15 PM   Mee Hives, PT MS Acute Rehab Dept. Number: [place] [facility] [place]

## 2018-08-04 LAB — GLUCOSE, CAPILLARY
GLUCOSE-CAPILLARY: 176 mg/dL — AB (ref 70–99)
GLUCOSE-CAPILLARY: 137 mg/dL — AB (ref 70–99)
Glucose-Capillary: 134 mg/dL — ABNORMAL HIGH (ref 70–99)
Glucose-Capillary: 209 mg/dL — ABNORMAL HIGH (ref 70–99)

## 2018-08-04 MED ORDER — AMPICILLIN-SULBACTAM IV (FOR PTA / DISCHARGE USE ONLY): 3.0000 g | Freq: Four times a day (QID) | INTRAVENOUS | 0 refills | Status: AC

## 2018-08-04 NOTE — Progress Notes (Signed)
Patient ID: Edward King, male   DOB: [birth year], 67 y.o.   MRN: [id number] patient was discharged canceled since [facility] not able to provide IV antibiotic dosing tonight/same day. Given the severity of infection patient cannot skip IV antibiotic hands discharge has been canceled till tomorrow.

## 2018-08-04 NOTE — Progress Notes (Signed)
Cat Bite with cellulitis of left leg and gram neg bacteremia Pt feeling better Leg pain much better- ambulating No chills appetite good   OBJECTIVE:  BP 130/68 (BP Location: Left Arm)   Pulse 79   Temp 97.9 F (36.6 C) (Oral)   Resp 18   Ht 5' 11"  (1.803 m)   Wt (!) 152 kg   SpO2 95%   BMI 46.72 kg/m   Physical Exam  Constitutional: looks well Cardiovascular: Normal rate, regular rhythm and normal heart sounds.  No murmur heard.  Pulmonary/Chest: Effort normal and breath sounds normal. He has no rales.  edema  erythema rt leg much improved   08/01/18   08/04/18    Today erythema is much better Neurological: He is alert and oriented to person, place, and time. No cranial nerve deficit.  Skin: Skin is warm.  Psychiatric: He has a normal mood and affect.  edema both legs    Lab Results   CBC Latest Ref Rng & Units 08/02/2018 08/01/2018 07/31/2018  WBC 3.8 - 10.6 K/uL 10.9(H) 15.7(H) 16.1(H)  Hemoglobin 13.0 - 18.0 g/dL 12.4(L) 12.5(L) 14.3  Hematocrit 40.0 - 52.0 % 37.0(L) 37.8(L) 42.6  Platelets 150 - 440 K/uL 179 201 239   CMP Latest Ref Rng & Units 08/02/2018 08/01/2018 07/31/2018  Glucose 70 - 99 mg/dL 132(H) 185(H) 202(H)  BUN 8 - 23 mg/dL 13 15 14   Creatinine 0.61 - 1.24 mg/dL 0.85 1.04 1.02  Sodium 135 - 145 mmol/L 138 137 137  Potassium 3.5 - 5.1 mmol/L 3.6 3.7 4.7  Chloride 98 - 111 mmol/L 102 100 102  CO2 22 - 32 mmol/L 31 30 27   Calcium 8.9 - 10.3 mg/dL 8.1(L) 8.0(L) 8.8(L)  Total Protein 6.5 - 8.1 g/dL - - 6.7  Total Bilirubin 0.3 - 1.2 mg/dL - - 0.8  Alkaline Phos 38 - 126 U/L - - 78  AST 15 - 41 U/L - - 31  ALT 0 - 44 U/L - - 19   8/14 BC- pasteurella multocida  Assessment and Plan  67 y.o. male history of diabetes mellitus, bilateral lymphedema of the legs, temporal arteritis, colitis presents with swelling of the right leg, pain and redness with fever following a cat bite sustained on Tuesday. Patient states his cat who is declawed jumped on his  trousers playfully when he was walking and then slid down and in the process nipped him on his right leg. This happened on Tuesday afternoon. That leg was swelling that evening and the next day it was very painful to walk and was red and he was having fever. He came to the emergency room on Wednesday evening around 5.30pm.    Cat bite leading to cellulitis of the right leg and pasteurella bacteremia with sepsis. Improving on unasyn 3 grams IV q6 Patient has underlying bilateral lymphedema  He will need IV antibiotics for 14 days because of the bacteremia. Last date of antibiotic will be on 08/15/18/ [facility] Please confirm home IV antibiotic availability before discharging patient  Diabetes mellitus on metformin and glipizide.   Temporal arteritis on low-dose steroids.   History of prostate CA status post radiation seeds.,   Ulcerative colitis on mesalamine.   Depression on Wellbutrin, Celexa   Discussed the management with the patient and his wife in great detail. Discussed with hospitalist and case manager and nurse

## 2018-08-04 NOTE — Care Management Note (Signed)
Case Management Note  Patient Details  Name: Edward King MRN: [id number] Date of Birth: 1951/02/22  Subjective/Objective:  Patient to be discharged per MD order. Orders in place for home health services. Previous RNCM had began workup for home health services due to IV abx need. [facility] and Pt services. PICC line in place. [facility] has necessary [facility] for [facility] abx. Pt already has a walker and cane in home. No further needs. Spouse to be primary support person and administrator of abx. Family to provide transport.                    Action/Plan:   Expected Discharge Date:  08/04/18               Expected Discharge Plan:     In-House Referral:     Discharge planning Services  CM Consult  Post Acute Care Choice:  Home Health Choice offered to:  Patient  DME Arranged:    DME Agency:     HH Arranged:  RN, PT [place] Agency:  [facility]  Status of Service:  Completed, signed off  If discussed at [place] of Stay Meetings, dates discussed:    Additional Comments:  Latanya Maudlin, RN 08/04/2018, 12:05 PM

## 2018-08-04 NOTE — Discharge Summary (Signed)
[facility] NAME: Edward King    MR#:  [id number]  DATE OF BIRTH:  May 01, 1951  DATE OF ADMISSION:  07/31/2018 ADMITTING PHYSICIAN: Vaughan Basta, MD  DATE OF DISCHARGE: 08/04/2018  PRIMARY CARE PHYSICIAN: Birdie Sons, MD    ADMISSION DIAGNOSIS:  Cellulitis of right leg without foot [L03.115] Cat bite, initial encounter [W55.01XA] Severe sepsis ([place]) [A41.9, R65.20]  DISCHARGE DIAGNOSIS:  Sepsis--Pasturella Multocida  Right LE cellulitis due to Cat bite  SECONDARY DIAGNOSIS:   Past Medical History:  Diagnosis Date  . Arthritis   . Colitis   . Depression   . Diabetes mellitus without complication ([place])   . 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 History of chicken pox   . History of measles   . History of mumps   . Hypertension   . Lymph edema    both legs.  mostly left  . Neuromuscular disorder ([name])   . Prostate CA ([place]) 2016   Radiation seed implants.  . Stroke ([place])   . Temporal arteritis ([facility])    Clinically dx Dr. Jefm Bryant. Negative temporal artrtery bx    HOSPITAL COURSE:  67 y.o. male history of diabetes mellitus, bilateral lymphedema of the legs, temporal arteritis, colitis presents with swelling of the right leg, pain and redness with fever following a cat bite sustained on Tuesday. Patient states his cat who is declawed jumped on his trousers playfully when he was walking and then slid down and in the process nipped him on his right leg.  * Sepsis Due tocat bite and cellulitis IV vancomycin and Zosyn narrowed down to IV Unasyn  1 set of blood cultures from August 14 with gram-negative rods Pasteurella multocida repeat blood cultures from August 16 are negative s/p PICC line placed on right UE on august 17th Follow-up with infectious disease, recommending IV antibiotics Unasyn every 6 hours for at least 14 days given the bacteremia--last dose august 29th,2019 Tetanus   recieved Improving leukocytosis almost back to normal Appreciate ID recommendations  *Lactic acidosis Trending down.  2.3---1.3  *Neuropathy secondary to diabetes Continue gabapentin.  *Diabetes Continue oral medications and keep on sliding scale coverage.  *Hyperlipidemia Continue statin.  *Generalized weakness PT consult-Home health PT  Overall feels a lot better D/c home with [facility] and PT CONSULTS OBTAINED:  Treatment Team:  Tsosie Billing, MD Fritzi Mandes, MD  DRUG ALLERGIES:  No Known Allergies  DISCHARGE MEDICATIONS:   Allergies as of 08/04/2018   No Known Allergies     Medication List    TAKE these medications   ampicillin-sulbactam  IVPB Commonly known as:  UNASYN Inject 3 g into the vein every 6 (six) hours for 13 days. Indication:  Pasteurella bacteremia Last Day of Therapy:  08/15/2018 Labs - Once weekly:  CBC/D and BMP, Labs - Every other week:  ESR and CRP   buPROPion 150 MG 12 hr tablet Commonly known as:  WELLBUTRIN SR Take 150 mg by mouth daily.   citalopram 40 MG tablet Commonly known as:  CELEXA Take 40 mg by mouth daily.   CoQ-10 100 MG Caps Take 100 mg by mouth daily.   folic acid 1 MG tablet Commonly known as:  FOLVITE Take 1 mg by mouth daily with lunch.   gabapentin 300 MG capsule Commonly known as:  NEURONTIN TAKE 2 CAPSULES BY MOUTH IN THE MORNING AND 3 CAPSULES AT BEDTIME What changed:  See the new instructions.   glipiZIDE 2.5  MG 24 hr tablet Commonly known as:  GLUCOTROL XL Take 1 tablet (2.5 mg total) by mouth daily.   mesalamine 1.2 g EC tablet Commonly known as:  LIALDA Take 2.4 g by mouth 2 (two) times daily.   metFORMIN 500 MG 24 hr tablet Commonly known as:  GLUCOPHAGE-XR TAKE 2 TABLETS (1,000 MG TOTAL) BY MOUTH 2 (TWO) TIMES DAILY.   mirtazapine 15 MG tablet Commonly known as:  REMERON Take 0.5 tablets by mouth at bedtime as needed (for sleep).   pravastatin 40 MG tablet Commonly known as:   PRAVACHOL TAKE 1 TABLET (40 MG TOTAL) BY MOUTH DAILY. What changed:  when to take this   predniSONE 5 MG tablet Commonly known as:  DELTASONE Take 5 mg by mouth every other day.   valsartan-hydrochlorothiazide 80-12.5 MG tablet Commonly known as:  DIOVAN-HCT TAKE 1 TABLET BY MOUTH EVERY DAY   Vitamin D3 5000 units Tabs Take 5,000 Units by mouth daily.            Home Infusion Instuctions  (From admission, onward)         Start     Ordered   08/04/18 0000  Home infusion instructions [facility] May follow [place] Dosing Protocol; May administer Cathflo as needed to maintain patency of vascular access device.; Flushing of vascular access device: per [facility] Protocol: 0.9% NaCl pre/post medica...    Question Answer Comment  Instructions May follow [place] Dosing Protocol   Instructions May administer Cathflo as needed to maintain patency of vascular access device.   Instructions Flushing of vascular access device: per [facility] Protocol: 0.9% NaCl pre/post medication administration and prn patency; Heparin 100 u/ml, 51m for implanted ports and Heparin 10u/ml, 550mfor all other central venous catheters.   Instructions May follow [facility] Anaphylaxis Protocol for First Dose Administration in the home: 0.9% NaCl at 25-50 ml/hr to maintain IV access for protocol meds. Epinephrine 0.3 ml IV/IM PRN and Benadryl 25-50 IV/IM PRN s/s of anaphylaxis.   Instructions [facility] Infusion Coordinator (RN) to assist per patient IV care needs in the home PRN.      08/04/18 0824          If you experience worsening of your admission symptoms, develop shortness of breath, life threatening emergency, suicidal or homicidal thoughts you must seek medical attention immediately by calling 911 or calling your MD immediately  if symptoms less severe.  You Must read complete instructions/literature along with all the possible adverse reactions/side effects for all the Medicines you take and  that have been prescribed to you. Take any new Medicines after you have completely understood and accept all the possible adverse reactions/side effects.   Please note  You were cared for by a hospitalist during your hospital stay. If you have any questions about your discharge medications or the care you received while you were in the hospital after you are discharged, you can call the unit and asked to speak with the hospitalist on call if the hospitalist that took care of you is not available. Once you are discharged, your primary care physician will handle any further medical issues. Please note that NO REFILLS for any discharge medications will be authorized once you are discharged, as it is imperative that you return to your primary care physician (or establish a relationship with a primary care physician if you do not have one) for your aftercare needs so that they can reassess your need for medications and monitor your  lab values. Today   SUBJECTIVE   Doing well Wants to gohome  VITAL SIGNS:  Blood pressure 130/68, pulse 79, temperature 97.9 F (36.6 C), temperature source Oral, resp. rate 18, height 5' 11"  (1.803 m), weight (!) 152 kg, SpO2 95 %.  I/O:    Intake/Output Summary (Last 24 hours) at 08/04/2018 0827 Last data filed at 08/04/2018 0700 Gross per 24 hour  Intake 1040 ml  Output 1726 ml  Net -686 ml    PHYSICAL EXAMINATION:  GENERAL:  67 y.o.-year-old patient lying in the bed with no acute distress.  EYES: Pupils equal, round, reactive to light and accommodation. No scleral icterus. Extraocular muscles intact.  HEENT: Head atraumatic, normocephalic. Oropharynx and nasopharynx clear.  NECK:  Supple, no jugular venous distention. No thyroid enlargement, no tenderness.  LUNGS: Normal breath sounds bilaterally, no wheezing, rales,rhonchi or crepitation. No use of accessory muscles of respiration.  CARDIOVASCULAR: S1, S2 normal. No murmurs, rubs, or gallops.  ABDOMEN:  Soft, non-tender, non-distended. Bowel sounds present. No organomegaly or mass.  EXTREMITIES: No pedal edema, cyanosis, or clubbing. Right LE swelling and erythema improving NEUROLOGIC: Cranial nerves II through XII are intact. Muscle strength 5/5 in all extremities. Sensation intact. Gait not checked.  PSYCHIATRIC: The patient is alert and oriented x 3.  SKIN: No obvious rash, lesion, or ulcer.   DATA REVIEW:   CBC  Recent Labs  Lab 08/02/18 0423  WBC 10.9*  HGB 12.4*  HCT 37.0*  PLT 179    Chemistries  Recent Labs  Lab 07/31/18 1746  08/02/18 0423  NA 137   < > 138  K 4.7   < > 3.6  CL 102   < > 102  CO2 27   < > 31  GLUCOSE 202*   < > 132*  BUN 14   < > 13  CREATININE 1.02   < > 0.85  CALCIUM 8.8*   < > 8.1*  AST 31  --   --   ALT 19  --   --   ALKPHOS 78  --   --   BILITOT 0.8  --   --    < > = values in this interval not displayed.    Microbiology Results   Recent Results (from the past 240 hour(s))  Culture, blood (Routine x 2)     Status: Abnormal   Collection Time: 07/31/18  5:46 PM  Result Value Ref Range Status   Specimen Description   Final    BLOOD BLOOD LEFT HAND Performed at [facility], [address] [place], [place] [address]    Special Requests   Final    BOTTLES DRAWN AEROBIC AND ANAEROBIC Blood Culture adequate volume Performed at [facility], [address] [place], [place] [address]    Culture  Setup Time   Final    GRAM NEGATIVE RODS IN BOTH AEROBIC AND ANAEROBIC BOTTLES CRITICAL RESULT CALLED TO, READ BACK BY AND VERIFIED WITH: KAREN HAYES AT 0756 08/01/18 SDR    Culture (A)  Final    PASTEURELLA MULTOCIDA Usually susceptible to penicillin and other beta lactam agents,quinolones,macrolides and tetracyclines. Performed at [facility], [place] [address] [place], [place] [address]    Report Status 08/02/2018 FINAL  Final  Blood Culture ID Panel (Reflexed)     Status: None   Collection Time: 07/31/18   5:46 PM  Result Value Ref Range Status   Enterococcus species NOT DETECTED NOT DETECTED Final   Listeria  monocytogenes NOT DETECTED NOT DETECTED Final   Staphylococcus species NOT DETECTED NOT DETECTED Final   Staphylococcus aureus NOT DETECTED NOT DETECTED Final   Streptococcus species NOT DETECTED NOT DETECTED Final   Streptococcus agalactiae NOT DETECTED NOT DETECTED Final   Streptococcus pneumoniae NOT DETECTED NOT DETECTED Final   Streptococcus pyogenes NOT DETECTED NOT DETECTED Final   Acinetobacter baumannii NOT DETECTED NOT DETECTED Final   Enterobacteriaceae species NOT DETECTED NOT DETECTED Final   Enterobacter cloacae complex NOT DETECTED NOT DETECTED Final   Escherichia coli NOT DETECTED NOT DETECTED Final   Klebsiella oxytoca NOT DETECTED NOT DETECTED Final   Klebsiella pneumoniae NOT DETECTED NOT DETECTED Final   Proteus species NOT DETECTED NOT DETECTED Final   Serratia marcescens NOT DETECTED NOT DETECTED Final   Haemophilus influenzae NOT DETECTED NOT DETECTED Final   Neisseria meningitidis NOT DETECTED NOT DETECTED Final   Pseudomonas aeruginosa NOT DETECTED NOT DETECTED Final   Candida albicans NOT DETECTED NOT DETECTED Final   Candida glabrata NOT DETECTED NOT DETECTED Final   Candida krusei NOT DETECTED NOT DETECTED Final   Candida parapsilosis NOT DETECTED NOT DETECTED Final   Candida tropicalis NOT DETECTED NOT DETECTED Final    Comment: Performed at [facility], [address] [place], [place] [address]  Culture, blood (Routine x 2)     Status: None (Preliminary result)   Collection Time: 07/31/18  7:30 PM  Result Value Ref Range Status   Specimen Description BLOOD LEFT ANTECUBITAL  Final   Special Requests   Final    BOTTLES DRAWN AEROBIC AND ANAEROBIC Blood Culture adequate volume   Culture   Final    NO GROWTH 4 DAYS Performed at [facility], [address] [place], [place] [address]    Report Status PENDING  Incomplete   CULTURE, BLOOD (ROUTINE X 2) w Reflex to ID Panel     Status: None (Preliminary result)   Collection Time: 08/02/18  4:23 AM  Result Value Ref Range Status   Specimen Description BLOOD RIGHT ANTECUBITAL  Final   Special Requests   Final    BOTTLES DRAWN AEROBIC AND ANAEROBIC Blood Culture adequate volume   Culture   Final    NO GROWTH 2 DAYS Performed at [facility], [address] [place], [place] [address]    Report Status PENDING  Incomplete  CULTURE, BLOOD (ROUTINE X 2) w Reflex to ID Panel     Status: None (Preliminary result)   Collection Time: 08/02/18  4:29 AM  Result Value Ref Range Status   Specimen Description BLOOD LEFT ANTECUBITAL  Final   Special Requests   Final    BOTTLES DRAWN AEROBIC AND ANAEROBIC Blood Culture adequate volume   Culture   Final    NO GROWTH 2 DAYS Performed at [facility], [address], [place], [place] [address]    Report Status PENDING  Incomplete    RADIOLOGY:  [place] Ekg Site Rite  Result Date: 08/03/2018 If Site Rite image not attached, placement could not be confirmed due to current cardiac rhythm.  [place] Ekg Site Rite  Result Date: 08/02/2018 If Site Rite image not attached, placement could not be confirmed due to current cardiac rhythm.    Management plans discussed with the patient, family and they are in agreement.  CODE STATUS:     Code Status Orders  (From admission, onward)         Start     Ordered   07/31/18 2207  Full code  Continuous     07/31/18 2206        Code Status History    Date Active Date Inactive Code Status Order ID Comments User Context   08/29/2015 0722 08/30/2015 1554 Full Code [id number]  Harrie Foreman, MD Inpatient      TOTAL TIME TAKING CARE OF THIS PATIENT: *40* minutes.    Fritzi Mandes M.D on 08/04/2018 at 8:27 AM  Between 7am to 6pm - Pager - [phone] After 6pm go to [email or web address] - password [email or web address] [facility]  Office   [phone]  CC: Primary care physician; Birdie Sons, MD

## 2018-08-04 NOTE — Progress Notes (Signed)
Due to scheduling conflicts patient will stay another night and receive the 1800, 0000 and 0600 dose here in the hospital and [place] care will pick up the 1200 dose starting tomorrow. [facility] unable to service the patient tonight for the first dose and MD is adamant patient not skip dosage due to the severity of the infection. Spouse will be in the home to assist with administration tomorrow afternoon.

## 2018-08-05 ENCOUNTER — Other Ambulatory Visit: Payer: Self-pay | Admitting: Family Medicine

## 2018-08-05 ENCOUNTER — Telehealth: Payer: Self-pay | Admitting: Family Medicine

## 2018-08-05 DIAGNOSIS — E1140 Type 2 diabetes mellitus with diabetic neuropathy, unspecified: Secondary | ICD-10-CM | POA: Diagnosis not present

## 2018-08-05 DIAGNOSIS — Z8673 Personal history of transient ischemic attack (TIA), and cerebral infarction without residual deficits: Secondary | ICD-10-CM | POA: Diagnosis not present

## 2018-08-05 DIAGNOSIS — Z7984 Long term (current) use of oral hypoglycemic drugs: Secondary | ICD-10-CM | POA: Diagnosis not present

## 2018-08-05 DIAGNOSIS — I890 Lymphedema, not elsewhere classified: Secondary | ICD-10-CM | POA: Diagnosis not present

## 2018-08-05 DIAGNOSIS — I10 Essential (primary) hypertension: Secondary | ICD-10-CM | POA: Diagnosis not present

## 2018-08-05 DIAGNOSIS — Z8546 Personal history of malignant neoplasm of prostate: Secondary | ICD-10-CM | POA: Diagnosis not present

## 2018-08-05 DIAGNOSIS — M1711 Unilateral primary osteoarthritis, right knee: Secondary | ICD-10-CM | POA: Diagnosis not present

## 2018-08-05 DIAGNOSIS — L03115 Cellulitis of right lower limb: Secondary | ICD-10-CM | POA: Diagnosis not present

## 2018-08-05 DIAGNOSIS — M316 Other giant cell arteritis: Secondary | ICD-10-CM | POA: Diagnosis not present

## 2018-08-05 DIAGNOSIS — Z5181 Encounter for therapeutic drug level monitoring: Secondary | ICD-10-CM | POA: Diagnosis not present

## 2018-08-05 DIAGNOSIS — E785 Hyperlipidemia, unspecified: Secondary | ICD-10-CM | POA: Diagnosis not present

## 2018-08-05 DIAGNOSIS — A4159 Other Gram-negative sepsis: Secondary | ICD-10-CM | POA: Diagnosis not present

## 2018-08-05 DIAGNOSIS — Z452 Encounter for adjustment and management of vascular access device: Secondary | ICD-10-CM | POA: Diagnosis not present

## 2018-08-05 DIAGNOSIS — F329 Major depressive disorder, single episode, unspecified: Secondary | ICD-10-CM | POA: Diagnosis not present

## 2018-08-05 DIAGNOSIS — W5501XA Bitten by cat, initial encounter: Secondary | ICD-10-CM | POA: Diagnosis not present

## 2018-08-05 LAB — CULTURE, BLOOD (ROUTINE X 2)
Culture: NO GROWTH
Special Requests: ADEQUATE

## 2018-08-05 LAB — GLUCOSE, CAPILLARY: Glucose-Capillary: 138 mg/dL — ABNORMAL HIGH (ref 70–99)

## 2018-08-05 NOTE — Telephone Encounter (Signed)
Please advise 

## 2018-08-05 NOTE — Progress Notes (Signed)
Pt in NAD, ready to go home. Pt declines medications states he will take his home meds at home.

## 2018-08-05 NOTE — Discharge Summary (Signed)
[facility] [place] NAME: Edward King    MR#:  [id number]  DATE OF BIRTH:  [birth year]  DATE OF ADMISSION:  07/31/2018 ADMITTING PHYSICIAN: Vaughan Basta, MD  DATE OF DISCHARGE: 08/05/2018  PRIMARY CARE PHYSICIAN: Birdie Sons, MD    ADMISSION DIAGNOSIS:  Cellulitis of right leg without foot [L03.115] Cat bite, initial encounter [W55.01XA] Severe sepsis ([place]) [A41.9, R65.20]  DISCHARGE DIAGNOSIS:  Sepsis--Pasturella Multocida  Right LE cellulitis due to Cat bite  SECONDARY DIAGNOSIS:   Past Medical History:  Diagnosis Date  . Arthritis   . Colitis   . Depression   . Diabetes mellitus without complication ([place])   . 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 History of chicken pox   . History of measles   . History of mumps   . Hypertension   . Lymph edema    both legs.  mostly left  . Neuromuscular disorder ([place])   . Prostate CA ([place]) 2016   Radiation seed implants.  . Stroke ([place])   . Temporal arteritis ([facility])    Clinically dx Dr. Jefm Bryant. Negative temporal artrtery bx    HOSPITAL COURSE:  67 y.o. male history of diabetes mellitus, bilateral lymphedema of the legs, temporal arteritis, colitis presents with swelling of the right leg, pain and redness with fever following a cat bite sustained on Tuesday. Patient states his cat who is declawed jumped on his trousers playfully when he was walking and then slid down and in the process nipped him on his right leg.  * Sepsis Due tocat bite and cellulitis IV vancomycin and Zosyn narrowed down to IV Unasyn  1 set of blood cultures from August 14 with gram-negative rods Pasteurella multocida repeat blood cultures from August 16 are negative s/p PICC line placed on right UE on august 17th Follow-up with infectious disease, recommending IV antibiotics Unasyn every 6 hours for at least 14 days given the bacteremia--last dose august 29th,2019 Tetanus   recieved Improving leukocytosis almost back to normal Appreciate ID recommendations  *Lactic acidosis Trending down.  2.3---1.3  *Neuropathy secondary to diabetes Continue gabapentin.  *Diabetes Continue oral medications and keep on sliding scale coverage.  *Hyperlipidemia Continue statin.  *Generalized weakness PT consult-Home health PT  Overall feels a lot better D/c home with [facility] and PT CONSULTS OBTAINED:  Treatment Team:  Tsosie Billing, MD Fritzi Mandes, MD  DRUG ALLERGIES:  No Known Allergies  DISCHARGE MEDICATIONS:   Allergies as of 08/05/2018   No Known Allergies     Medication List    TAKE these medications   ampicillin-sulbactam  IVPB Commonly known as:  UNASYN Inject 3 g into the vein every 6 (six) hours for 13 days. Indication:  Pasteurella bacteremia Last Day of Therapy:  08/15/2018 Labs - Once weekly:  CBC/D and BMP, Labs - Every other week:  ESR and CRP   buPROPion 150 MG 12 hr tablet Commonly known as:  WELLBUTRIN SR Take 150 mg by mouth daily.   citalopram 40 MG tablet Commonly known as:  CELEXA Take 40 mg by mouth daily.   CoQ-10 100 MG Caps Take 100 mg by mouth daily.   folic acid 1 MG tablet Commonly known as:  FOLVITE Take 1 mg by mouth daily with lunch.   gabapentin 300 MG capsule Commonly known as:  NEURONTIN TAKE 2 CAPSULES BY MOUTH IN THE MORNING AND 3 CAPSULES AT BEDTIME What changed:  See the new instructions.   glipiZIDE 2.5  MG 24 hr tablet Commonly known as:  GLUCOTROL XL Take 1 tablet (2.5 mg total) by mouth daily.   mesalamine 1.2 g EC tablet Commonly known as:  LIALDA Take 2.4 g by mouth 2 (two) times daily.   metFORMIN 500 MG 24 hr tablet Commonly known as:  GLUCOPHAGE-XR TAKE 2 TABLETS (1,000 MG TOTAL) BY MOUTH 2 (TWO) TIMES DAILY.   mirtazapine 15 MG tablet Commonly known as:  REMERON Take 0.5 tablets by mouth at bedtime as needed (for sleep).   pravastatin 40 MG tablet Commonly known as:   PRAVACHOL TAKE 1 TABLET (40 MG TOTAL) BY MOUTH DAILY. What changed:  when to take this   predniSONE 5 MG tablet Commonly known as:  DELTASONE Take 5 mg by mouth every other day.   valsartan-hydrochlorothiazide 80-12.5 MG tablet Commonly known as:  DIOVAN-HCT TAKE 1 TABLET BY MOUTH EVERY DAY   Vitamin D3 5000 units Tabs Take 5,000 Units by mouth daily.            Home Infusion Instuctions  (From admission, onward)         Start     Ordered   08/04/18 0000  Home infusion instructions [facility] May follow [place] Dosing Protocol; May administer Cathflo as needed to maintain patency of vascular access device.; Flushing of vascular access device: per [facility] Protocol: 0.9% NaCl pre/post medica...    Question Answer Comment  Instructions May follow [place] Dosing Protocol   Instructions May administer Cathflo as needed to maintain patency of vascular access device.   Instructions Flushing of vascular access device: per [facility] Protocol: 0.9% NaCl pre/post medication administration and prn patency; Heparin 100 u/ml, 47m for implanted ports and Heparin 10u/ml, 598mfor all other central venous catheters.   Instructions May follow [facility] Anaphylaxis Protocol for First Dose Administration in the home: 0.9% NaCl at 25-50 ml/hr to maintain IV access for protocol meds. Epinephrine 0.3 ml IV/IM PRN and Benadryl 25-50 IV/IM PRN s/s of anaphylaxis.   Instructions [facility] Infusion Coordinator (RN) to assist per patient IV care needs in the home PRN.      08/04/18 0824           Durable Medical Equipment  (From admission, onward)         Start     Ordered   08/04/18 0833  For home use only DME Walker rolling  Once    Question:  Patient needs a walker to treat with the following condition  Answer:  Weakness   08/04/18 0832          If you experience worsening of your admission symptoms, develop shortness of breath, life threatening emergency, suicidal or homicidal  thoughts you must seek medical attention immediately by calling 911 or calling your MD immediately  if symptoms less severe.  You Must read complete instructions/literature along with all the possible adverse reactions/side effects for all the Medicines you take and that have been prescribed to you. Take any new Medicines after you have completely understood and accept all the possible adverse reactions/side effects.   Please note  You were cared for by a hospitalist during your hospital stay. If you have any questions about your discharge medications or the care you received while you were in the hospital after you are discharged, you can call the unit and asked to speak with the hospitalist on call if the hospitalist that took care of you is not available. Once you are discharged, your  primary care physician will handle any further medical issues. Please note that NO REFILLS for any discharge medications will be authorized once you are discharged, as it is imperative that you return to your primary care physician (or establish a relationship with a primary care physician if you do not have one) for your aftercare needs so that they can reassess your need for medications and monitor your lab values. Today   SUBJECTIVE   Doing well  VITAL SIGNS:  Blood pressure 128/71, pulse 77, temperature 97.9 F (36.6 C), temperature source Oral, resp. rate 18, height 5' 11" (1.803 m), weight (!) 152 kg, SpO2 95 %.  I/O:    Intake/Output Summary (Last 24 hours) at 08/05/2018 0801 Last data filed at 08/05/2018 0615 Gross per 24 hour  Intake 360 ml  Output 1600 ml  Net -1240 ml    PHYSICAL EXAMINATION:  GENERAL:  67 y.o.-year-old patient lying in the bed with no acute distress.  EYES: Pupils equal, round, reactive to light and accommodation. No scleral icterus. Extraocular muscles intact.  HEENT: Head atraumatic, normocephalic. Oropharynx and nasopharynx clear.  NECK:  Supple, no jugular venous  distention. No thyroid enlargement, no tenderness.  LUNGS: Normal breath sounds bilaterally, no wheezing, rales,rhonchi or crepitation. No use of accessory muscles of respiration.  CARDIOVASCULAR: S1, S2 normal. No murmurs, rubs, or gallops.  ABDOMEN: Soft, non-tender, non-distended. Bowel sounds present. No organomegaly or mass.  EXTREMITIES: No pedal edema, cyanosis, or clubbing. Right LE swelling and erythema improving NEUROLOGIC: Cranial nerves II through XII are intact. Muscle strength 5/5 in all extremities. Sensation intact. Gait not checked.  PSYCHIATRIC: The patient is alert and oriented x 3.  SKIN: No obvious rash, lesion, or ulcer.   DATA REVIEW:   CBC  Recent Labs  Lab 08/02/18 0423  WBC 10.9*  HGB 12.4*  HCT 37.0*  PLT 179    Chemistries  Recent Labs  Lab 07/31/18 1746  08/02/18 0423  NA 137   < > 138  K 4.7   < > 3.6  CL 102   < > 102  CO2 27   < > 31  GLUCOSE 202*   < > 132*  BUN 14   < > 13  CREATININE 1.02   < > 0.85  CALCIUM 8.8*   < > 8.1*  AST 31  --   --   ALT 19  --   --   ALKPHOS 78  --   --   BILITOT 0.8  --   --    < > = values in this interval not displayed.    Microbiology Results   Recent Results (from the past 240 hour(s))  Culture, blood (Routine x 2)     Status: Abnormal   Collection Time: 07/31/18  5:46 PM  Result Value Ref Range Status   Specimen Description   Final    BLOOD BLOOD LEFT HAND Performed at [facility], [address], [place], [place] [address]    Special Requests   Final    BOTTLES DRAWN AEROBIC AND ANAEROBIC Blood Culture adequate volume Performed at [facility], [address] [place], [place] [address]    Culture  Setup Time   Final    GRAM NEGATIVE RODS IN BOTH AEROBIC AND ANAEROBIC BOTTLES CRITICAL RESULT CALLED TO, READ BACK BY AND VERIFIED WITH: KAREN HAYES AT 0756 08/01/18 SDR    Culture (A)  Final    PASTEURELLA MULTOCIDA Usually susceptible to penicillin and other beta  lactam  agents,quinolones,macrolides and tetracyclines. Performed at [facility], [place] [address] [place], [place] [address]    Report Status 08/02/2018 FINAL  Final  Blood Culture ID Panel (Reflexed)     Status: None   Collection Time: 07/31/18  5:46 PM  Result Value Ref Range Status   Enterococcus species NOT DETECTED NOT DETECTED Final   Listeria monocytogenes NOT DETECTED NOT DETECTED Final   Staphylococcus species NOT DETECTED NOT DETECTED Final   Staphylococcus aureus NOT DETECTED NOT DETECTED Final   Streptococcus species NOT DETECTED NOT DETECTED Final   Streptococcus agalactiae NOT DETECTED NOT DETECTED Final   Streptococcus pneumoniae NOT DETECTED NOT DETECTED Final   Streptococcus pyogenes NOT DETECTED NOT DETECTED Final   Acinetobacter baumannii NOT DETECTED NOT DETECTED Final   Enterobacteriaceae species NOT DETECTED NOT DETECTED Final   Enterobacter cloacae complex NOT DETECTED NOT DETECTED Final   Escherichia coli NOT DETECTED NOT DETECTED Final   Klebsiella oxytoca NOT DETECTED NOT DETECTED Final   Klebsiella pneumoniae NOT DETECTED NOT DETECTED Final   Proteus species NOT DETECTED NOT DETECTED Final   Serratia marcescens NOT DETECTED NOT DETECTED Final   Haemophilus influenzae NOT DETECTED NOT DETECTED Final   Neisseria meningitidis NOT DETECTED NOT DETECTED Final   Pseudomonas aeruginosa NOT DETECTED NOT DETECTED Final   Candida albicans NOT DETECTED NOT DETECTED Final   Candida glabrata NOT DETECTED NOT DETECTED Final   Candida krusei NOT DETECTED NOT DETECTED Final   Candida parapsilosis NOT DETECTED NOT DETECTED Final   Candida tropicalis NOT DETECTED NOT DETECTED Final    Comment: Performed at [facility], [address] [place], [place] [address]  Culture, blood (Routine x 2)     Status: None (Preliminary result)   Collection Time: 07/31/18  7:30 PM  Result Value Ref Range Status   Specimen Description BLOOD LEFT ANTECUBITAL  Final   Special  Requests   Final    BOTTLES DRAWN AEROBIC AND ANAEROBIC Blood Culture adequate volume   Culture   Final    NO GROWTH 4 DAYS Performed at [facility], [address] [place], [place] [address]    Report Status PENDING  Incomplete  CULTURE, BLOOD (ROUTINE X 2) w Reflex to ID Panel     Status: None (Preliminary result)   Collection Time: 08/02/18  4:23 AM  Result Value Ref Range Status   Specimen Description BLOOD RIGHT ANTECUBITAL  Final   Special Requests   Final    BOTTLES DRAWN AEROBIC AND ANAEROBIC Blood Culture adequate volume   Culture   Final    NO GROWTH 2 DAYS Performed at [facility], [address] [place], [place] [address]    Report Status PENDING  Incomplete  CULTURE, BLOOD (ROUTINE X 2) w Reflex to ID Panel     Status: None (Preliminary result)   Collection Time: 08/02/18  4:29 AM  Result Value Ref Range Status   Specimen Description BLOOD LEFT ANTECUBITAL  Final   Special Requests   Final    BOTTLES DRAWN AEROBIC AND ANAEROBIC Blood Culture adequate volume   Culture   Final    NO GROWTH 2 DAYS Performed at [facility], [address] [place], [place] [address]    Report Status PENDING  Incomplete    RADIOLOGY:  [place] Ekg Site Rite  Result Date: 08/03/2018 If Site Rite image not attached, placement could not be confirmed due to current cardiac rhythm.    Management plans discussed with  the patient, family and they are in agreement.  CODE STATUS:     Code Status Orders  (From admission, onward)         Start     Ordered   07/31/18 2207  Full code  Continuous     07/31/18 2206        Code Status History    Date Active Date Inactive Code Status Order ID Comments User Context   08/29/2015 0722 08/30/2015 1554 Full Code [id number]  Harrie Foreman, MD Inpatient      TOTAL TIME TAKING CARE OF THIS PATIENT: *40* minutes.    Fritzi Mandes M.D on 08/05/2018 at 8:01 AM  Between 7am to 6pm - Pager - [phone] After  6pm go to [email or web address] - password [email or web address] [place] [facility]  Office  [phone]  CC: Primary care physician; Birdie Sons, MD

## 2018-08-05 NOTE — Telephone Encounter (Signed)
Edward King was advised.

## 2018-08-05 NOTE — Progress Notes (Signed)
Nsg Discharge Note  Admit Date:  07/31/2018 Discharge date: 08/05/2018   Edward King to be D/C'd Home per MD order.  AVS completed.  Copy for chart, and copy for patient signed, and dated. Patient/caregiver able to verbalize understanding.  Discharge Medication: Allergies as of 08/05/2018   No Known Allergies     Medication List    TAKE these medications   ampicillin-sulbactam  IVPB Commonly known as:  UNASYN Inject 3 g into the vein every 6 (six) hours for 13 days. Indication:  Pasteurella bacteremia Last Day of Therapy:  08/15/2018 Labs - Once weekly:  CBC/D and BMP, Labs - Every other week:  ESR and CRP   buPROPion 150 MG 12 hr tablet Commonly known as:  WELLBUTRIN SR Take 150 mg by mouth daily.   citalopram 40 MG tablet Commonly known as:  CELEXA Take 40 mg by mouth daily.   CoQ-10 100 MG Caps Take 100 mg by mouth daily.   folic acid 1 MG tablet Commonly known as:  FOLVITE Take 1 mg by mouth daily with lunch.   gabapentin 300 MG capsule Commonly known as:  NEURONTIN TAKE 2 CAPSULES BY MOUTH IN THE MORNING AND 3 CAPSULES AT BEDTIME What changed:  See the new instructions.   glipiZIDE 2.5 MG 24 hr tablet Commonly known as:  GLUCOTROL XL Take 1 tablet (2.5 mg total) by mouth daily.   mesalamine 1.2 g EC tablet Commonly known as:  LIALDA Take 2.4 g by mouth 2 (two) times daily.   metFORMIN 500 MG 24 hr tablet Commonly known as:  GLUCOPHAGE-XR TAKE 2 TABLETS (1,000 MG TOTAL) BY MOUTH 2 (TWO) TIMES DAILY.   mirtazapine 15 MG tablet Commonly known as:  REMERON Take 0.5 tablets by mouth at bedtime as needed (for sleep).   pravastatin 40 MG tablet Commonly known as:  PRAVACHOL TAKE 1 TABLET (40 MG TOTAL) BY MOUTH DAILY. What changed:  when to take this   predniSONE 5 MG tablet Commonly known as:  DELTASONE Take 5 mg by mouth every other day.   valsartan-hydrochlorothiazide 80-12.5 MG tablet Commonly known as:  DIOVAN-HCT TAKE 1 TABLET BY MOUTH EVERY  DAY   Vitamin D3 5000 units Tabs Take 5,000 Units by mouth daily.            Home Infusion Instuctions  (From admission, onward)         Start     Ordered   08/04/18 0000  Home infusion instructions [facility] May follow [place] Dosing Protocol; May administer Cathflo as needed to maintain patency of vascular access device.; Flushing of vascular access device: per [facility] Protocol: 0.9% NaCl pre/post medica...    Question Answer Comment  Instructions May follow [facility] Dosing Protocol   Instructions May administer Cathflo as needed to maintain patency of vascular access device.   Instructions Flushing of vascular access device: per [facility] Protocol: 0.9% NaCl pre/post medication administration and prn patency; Heparin 100 u/ml, 31m for implanted ports and Heparin 10u/ml, 589mfor all other central venous catheters.   Instructions May follow [facility] Anaphylaxis Protocol for First Dose Administration in the home: 0.9% NaCl at 25-50 ml/hr to maintain IV access for protocol meds. Epinephrine 0.3 ml IV/IM PRN and Benadryl 25-50 IV/IM PRN s/s of anaphylaxis.   Instructions [facility] Infusion Coordinator (RN) to assist per patient IV care needs in the home PRN.      08/04/18 [id number]  Durable Medical Equipment  (From admission, onward)         Start     Ordered   08/04/18 [phone]  For home use only DME Walker rolling  Once    Question:  Patient needs a walker to treat with the following condition  Answer:  Weakness   08/04/18 0832          Discharge Assessment: Vitals:   08/05/18 0613 08/05/18 0615  BP: (!) 130/100 128/71  Pulse: 81 77  Resp: 18   Temp: 97.9 F (36.6 C)   SpO2: 95%    Skin clean, dry and intact without evidence of skin break down, no evidence of skin tears noted. IV catheter discontinued intact. Site without signs and symptoms of complications - no redness or edema noted at insertion site, patient denies c/o pain - only slight tenderness  at site.  Dressing with slight pressure applied.  D/c Instructions-Education: Discharge instructions given to patient/family with verbalized understanding. D/c education completed with patient/family including follow up instructions, medication list, d/c activities limitations if indicated, with other d/c instructions as indicated by MD - patient able to verbalize understanding, all questions fully answered. Patient instructed to return to ED, call 911, or call MD for any changes in condition.  Patient escorted via [place], and D/C home via private auto.  Eda Keys, RN 08/05/2018 8:17 AM

## 2018-08-05 NOTE — Telephone Encounter (Signed)
[name] with [facility] needs verbal plan of care of nurse care 1 time a week for next three weeks.  Also needs clarification on how to take his wellbutrin 116m.  You had prescribed one time a day and the hospital changed it to twice a day.  Which should he take..Marland Kitchen CB#   [phone] Thanks tCon Memos

## 2018-08-05 NOTE — Care Management Note (Signed)
Case Management Note  Patient Details  Name: Edward King MRN: [id number] Date of Birth: [birth year]   Patient discharged today. Corene Cornea with [facility] notified.  [facility] to start IV antibiotics at 12pm  Subjective/Objective:                    Action/Plan:   Expected Discharge Date:  08/05/18               Expected Discharge Plan:  [place]  In-House Referral:     Discharge planning Services  CM Consult  Post Acute Care Choice:  Home Health Choice offered to:  Patient  DME Arranged:    DME Agency:     HH Arranged:  RN, PT [place] Agency:  [place]  Status of Service:  Completed, signed off  If discussed at [place] of Stay Meetings, dates discussed:    Additional Comments:  Beverly Sessions, RN 08/05/2018, 10:54 AM

## 2018-08-05 NOTE — Care Management Important Message (Signed)
Patient discharged prior to my arrival to floor to deliver concurrent Medicare IM.

## 2018-08-05 NOTE — Telephone Encounter (Signed)
OK for plan of care. wellbutrin should be one tablet daily.

## 2018-08-06 ENCOUNTER — Telehealth: Payer: Self-pay

## 2018-08-06 NOTE — Telephone Encounter (Signed)
Transition Care Management Follow-up Telephone Call  Date of discharge and from where: [facility] on 08/05/18  How have you been since you were released from the hospital? Doing well, but currently having diarrhea. Pt thinks it is due to the IV abx. Right leg is still swollen and red but pt states he has not been able to where his compression stockings. Pain is better, but still tender to touch.   Any questions or concerns? What to do about diarrhea.   Items Reviewed:  Did the pt receive and understand the discharge instructions provided? Yes   Medications obtained and verified? Per pt- only change was adding the abx.  Any new allergies since your discharge? No   Dietary orders reviewed? Yes  Do you have support at home? Yes   Other (ie: DME, Home Health, etc) [place] and a walker.  Functional Questionnaire: (I = Independent and D = Dependent) ADL's: Cane and walker  Bathing/Dressing- I   Meal Prep- I  Eating- I  Maintaining continence- I  Transferring/Ambulation- Uses ADLs listed above PRN.  Managing Meds- I   Follow up appointments reviewed:    PCP Hospital f/u appt confirmed? Yes  Scheduled to see Dr Caryn Section on 08/12/18 @ 4:20 PM.  [facility] f/u appt confirmed? N/A  Are transportation arrangements needed? No   If their condition worsens, is the pt aware to call  their PCP or go to the ED? Yes  Was the patient provided with contact information for the PCP's office or ED? Yes  Was the pt encouraged to call back with questions or concerns? Yes

## 2018-08-07 LAB — CULTURE, BLOOD (ROUTINE X 2)
CULTURE: NO GROWTH
Culture: NO GROWTH
Special Requests: ADEQUATE
Special Requests: ADEQUATE

## 2018-08-09 ENCOUNTER — Other Ambulatory Visit: Payer: Self-pay

## 2018-08-09 NOTE — Patient Outreach (Signed)
[facility]) Care Management  08/09/2018  Edward King [birth year] [id number]  67 year old male outreached by [place] services requested for 30 day post discharge medication review.  PMHx includes, but not limited to, hypertension, chronic colitis, Type 2 diabetes mellitus.  Unsuccessful outreach attempt #1 to [name].   Left HIPAA compliant voice message requesting a return call.  Plan: Outreach attempt #2 in 3-4 business days.  Joetta Manners, PharmD Clinical Pharmacist [place] [phone]

## 2018-08-09 NOTE — Patient Outreach (Signed)
[facility] [place] [facility]) Care Management  [place]  08/09/2018  Edward King 10-09-1951 [id number]  67 year old male outreached by [facility] services requested for 30 day post discharge medication review.  PMHx includes, but not limited to, hypertension, chronic colitis, Type 2 diabetes mellitus.  Unsuccessful outreach attempt #1 to Edward King.   Left HIPAA compliant voice message requesting a return call.  Plan: Outreach attempt #2 in 3-4 business days.  Joetta Manners, PharmD Clinical Pharmacist [place] [phone]   Addendum:  Incoming call received from Edward King.  HIPAA identifiers verified.   Subjective: Edward King reports that he is doing well after his recent hospital admission.  He states he is getting IV antibiotics every 6 hours for an infection from a cat bite.   He reports that he has lymphedema, but is able to get around independently.  He reports that he has not checked his glucose since he has been home from the hospital.   Edward King has agreed for [facility] diabetes health coach to outreach to him.  Objective:  Hg A1c 8% on 05/22/18 up from 6.8% in November. SCr 0.85 mg/dL on 08/02/18  Current Medications: Current Outpatient Medications  Medication Sig Dispense Refill  . ampicillin-sulbactam (UNASYN) IVPB Inject 3 g into the vein every 6 (six) hours for 13 days. Indication:  Pasteurella bacteremia Last Day of Therapy:  08/15/2018 Labs - Once weekly:  CBC/D and BMP, Labs - Every other week:  ESR and CRP 52 Units 0  . buPROPion (WELLBUTRIN SR) 150 MG 12 hr tablet Take 150 mg by mouth daily.    . citalopram (CELEXA) 40 MG tablet Take 40 mg by mouth daily.     . Coenzyme Q10 (COQ-10) 100 MG CAPS Take 100 mg by mouth daily.    Marland Kitchen gabapentin (NEURONTIN) 300 MG capsule TAKE 2 CAPSULES BY MOUTH IN THE MORNING AND 3 CAPSULES AT BEDTIME (Patient taking differently: Take 600-900 mg by mouth See admin instructions. 600 mg every  morning and 900 mg at bedtime) 450 capsule 2  . glipiZIDE (GLUCOTROL XL) 2.5 MG 24 hr tablet Take 1 tablet (2.5 mg total) by mouth daily. 90 tablet 4  . mesalamine (LIALDA) 1.2 g EC tablet Take 2.4 g by mouth 2 (two) times daily.     . metFORMIN (GLUCOPHAGE-XR) 500 MG 24 hr tablet TAKE 2 TABLETS (1,000 MG TOTAL) BY MOUTH 2 (TWO) TIMES DAILY. 360 tablet 4  . pravastatin (PRAVACHOL) 40 MG tablet TAKE 1 TABLET (40 MG TOTAL) BY MOUTH DAILY. (Patient taking differently: Take 40 mg by mouth daily with lunch. ) 30 tablet 12  . predniSONE (DELTASONE) 5 MG tablet Take 5 mg by mouth every other day.     . valsartan-hydrochlorothiazide (DIOVAN-HCT) 80-12.5 MG tablet Take 1 tablet by mouth daily. 90 tablet 4  . Vitamin D, Cholecalciferol, 1000 units TABS Take 1,000 Units by mouth daily.    . folic acid (FOLVITE) 1 MG tablet Take 1 mg by mouth daily with lunch.      No current facility-administered medications for this visit.     Functional Status: In your present state of health, do you have any difficulty performing the following activities: 08/01/2018 07/31/2018  Hearing? N -  [place]? N -  Difficulty concentrating or making decisions? N -  Walking or climbing stairs? N -  Dressing or bathing? N -  Doing errands, shopping? N N  Preparing Food and eating ? - -  Using the Toilet? - -  In the past six months, have you accidently leaked urine? - -  Do you have problems with loss of bowel control? - -  Managing your Medications? - -  Managing your Finances? - -  Housekeeping or managing your Housekeeping? - -  Some recent data might be hidden    Fall/Depression Screening: Fall Risk  05/22/2018 05/09/2017 02/22/2017  Falls in the past year? No No No  Number falls in past yr: - - -  Injury with Fall? - - -  Risk for fall due to : - - -  Risk for fall due to: Comment - - -  Follow up - - -   PHQ 2/9 Scores 05/22/2018 05/09/2017 11/27/2016 10/13/2016  PHQ - 2 Score 2 2 0 3  PHQ- 9 Score 6 7  - 9   ASSESSMENT: Date Discharged from Hospital: 08/05/18 Date Medication Reconciliation Performed: 08/09/2018  New Medications at Discharge:  Unasyn  Patient was recently discharged from hospital and all medications have been reviewed  Drugs sorted by system:  Neurologic/Psychologic: bupropion, citalopram, gabapentin  Cardiovascular: pravastatin, valsartan/HCTZ  Gastrointestinal: mesalamine  Endocrine: glipizide, metformin, prednisione  Vitamins/Minerals: coenzyme T90, folic acid, vitamin D  Infectious Diseases: ampicillin/sulbactam     Other issues noted:  Patient is out of folic acid and need new prescription.  He will follow up at PCP visit on 08/12/18  Discussed that his HgA1c has increased to 8%.  Encouraged him to start checking his glucose everyday and to make better food choices.  He states he is taking his medications as prescribed.  I have ordered a [facility] health coach to outreach to him for diabetes management.  Plan: Route note to PCP, Dr. Caryn Section.   Joetta Manners, PharmD Clinical Pharmacist [place] [phone]

## 2018-08-12 ENCOUNTER — Encounter: Payer: Self-pay | Admitting: Family Medicine

## 2018-08-12 ENCOUNTER — Ambulatory Visit (INDEPENDENT_AMBULATORY_CARE_PROVIDER_SITE_OTHER): Payer: PPO | Admitting: Family Medicine

## 2018-08-12 VITALS — BP 127/80 | HR 67 | Temp 98.5°F | Resp 18 | Ht 71.0 in | Wt 327.0 lb

## 2018-08-12 DIAGNOSIS — R0982 Postnasal drip: Secondary | ICD-10-CM

## 2018-08-12 DIAGNOSIS — B370 Candidal stomatitis: Secondary | ICD-10-CM | POA: Diagnosis not present

## 2018-08-12 DIAGNOSIS — A280 Pasteurellosis: Secondary | ICD-10-CM | POA: Diagnosis not present

## 2018-08-12 DIAGNOSIS — M25561 Pain in right knee: Secondary | ICD-10-CM | POA: Diagnosis not present

## 2018-08-12 DIAGNOSIS — L03115 Cellulitis of right lower limb: Secondary | ICD-10-CM

## 2018-08-12 DIAGNOSIS — I890 Lymphedema, not elsewhere classified: Secondary | ICD-10-CM | POA: Diagnosis not present

## 2018-08-12 MED ORDER — FLUCONAZOLE 100 MG PO TABS: 100.0000 mg | ORAL_TABLET | Freq: Every day | ORAL | 0 refills | Status: AC

## 2018-08-12 MED ORDER — AZELASTINE HCL 0.1 % NA SOLN: 2.0000 | Freq: Two times a day (BID) | NASAL | 1 refills | Status: DC

## 2018-08-12 MED ORDER — MELOXICAM 15 MG PO TABS: 15.0000 mg | ORAL_TABLET | Freq: Every day | ORAL | 0 refills | Status: DC

## 2018-08-12 NOTE — Progress Notes (Signed)
Patient: Edward King Male    DOB: 25-Feb-1951   67 y.o.   MRN: [id number] Visit Date: 08/12/2018  Today's Provider: Lelon Huh, MD   Chief Complaint  Patient presents with  . Hospitalization Follow-up   Subjective:    HPI  Follow up Hospitalization  Patient was admitted to [facility] on 07/31/2018 and discharged on 08/05/2018. He was treated for severe sepsis and right LE Cellulitis of right lowe leg due to cat bite Treatment for this included giving patient IV Vancomycin and Zosyn narrowed down to IV Unasyn. Patient was discharged home with home health RN and PT. Telephone follow up was done on 08/06/2018 He reports good compliance with treatment. He reports this condition is Improved. Continues on home IV unasyn which he is tolerating well. No fevers chills or sweats ------------------------------------------------------------------------------------ He is also having some pain along joint line of outer right knee. No swelling or tenderness.   He also report some burning in his mouth the last few days.   He also has had quite a bit of drainage in the back of his throat lately.       No Known Allergies   Current Outpatient Medications:  .  ampicillin-sulbactam (UNASYN) IVPB, Inject 3 g into the vein every 6 (six) hours for 13 days. Indication:  Pasteurella bacteremia Last Day of Therapy:  08/15/2018 Labs - Once weekly:  CBC/D and BMP, Labs - Every other week:  ESR and CRP, Disp: 52 Units, Rfl: 0 .  buPROPion (WELLBUTRIN SR) 150 MG 12 hr tablet, Take 150 mg by mouth daily., Disp: , Rfl:  .  citalopram (CELEXA) 40 MG tablet, Take 40 mg by mouth daily. , Disp: , Rfl:  .  Coenzyme Q10 (COQ-10) 100 MG CAPS, Take 100 mg by mouth daily., Disp: , Rfl:  .  folic acid (FOLVITE) 1 MG tablet, Take 1 mg by mouth daily with lunch. , Disp: , Rfl:  .  gabapentin (NEURONTIN) 300 MG capsule, TAKE 2 CAPSULES BY MOUTH IN THE MORNING AND 3 CAPSULES AT BEDTIME (Patient taking differently:  Take 600-900 mg by mouth See admin instructions. 600 mg every morning and 900 mg at bedtime), Disp: 450 capsule, Rfl: 2 .  glipiZIDE (GLUCOTROL XL) 2.5 MG 24 hr tablet, Take 1 tablet (2.5 mg total) by mouth daily., Disp: 90 tablet, Rfl: 4 .  mesalamine (LIALDA) 1.2 g EC tablet, Take 2.4 g by mouth 2 (two) times daily. , Disp: , Rfl:  .  metFORMIN (GLUCOPHAGE-XR) 500 MG 24 hr tablet, TAKE 2 TABLETS (1,000 MG TOTAL) BY MOUTH 2 (TWO) TIMES DAILY., Disp: 360 tablet, Rfl: 4 .  pravastatin (PRAVACHOL) 40 MG tablet, TAKE 1 TABLET (40 MG TOTAL) BY MOUTH DAILY. (Patient taking differently: Take 40 mg by mouth daily with lunch. ), Disp: 30 tablet, Rfl: 12 .  predniSONE (DELTASONE) 5 MG tablet, Take 5 mg by mouth every other day. , Disp: , Rfl:  .  valsartan-hydrochlorothiazide (DIOVAN-HCT) 80-12.5 MG tablet, Take 1 tablet by mouth daily., Disp: 90 tablet, Rfl: 4 .  Vitamin D, Cholecalciferol, 1000 units TABS, Take 1,000 Units by mouth daily., Disp: , Rfl:   Review of Systems  Constitutional: Negative for appetite change, chills and fever.  Respiratory: Negative for chest tightness, shortness of breath and wheezing.   Cardiovascular: Negative for chest pain and palpitations.  Gastrointestinal: Negative for abdominal pain, nausea and vomiting.  Musculoskeletal: Positive for arthralgias (right knee pain).  Skin: Positive for wound.  Social History   Tobacco Use  . Smoking status: Never Smoker  . Smokeless tobacco: Never Used  Substance Use Topics  . Alcohol use: Yes    Alcohol/week: 0.0 standard drinks    Comment: Rare - 1 drink   Objective:   BP 127/80 (BP Location: Left Arm, Patient Position: Sitting, Cuff Size: Large)   Pulse 67   Temp 98.5 F (36.9 C) (Oral)   Resp 18   Ht 5' 11"  (1.803 m)   Wt (!) 327 lb (148.3 kg)   SpO2 98% Comment: room air  BMI 45.61 kg/m  Vitals:   08/12/18 1634  BP: 127/80  Pulse: 67  Resp: 18  Temp: 98.5 F (36.9 C)  TempSrc: Oral  SpO2: 98%  Weight:  (!) 327 lb (148.3 kg)  Height: 5' 11"  (1.803 m)     Physical Exam   General Appearance:    Alert, cooperative, no distress  Eyes:    PERRL, conjunctiva/corneas clear, EOM's intact       ENT:   Mild thrush noted. Mild post nasal drainage   Lungs:     Clear to auscultation bilaterally, respirations unlabored  Heart:    Regular rate and rhythm  Ext::   3+ bipedal edema with dull redness of both LEs which is chronic diabetic changes. Right LE redness is slightly brighter, but not tender at all.           Assessment & Plan:     1. Acute pain of right knee C/w OA knee.  - meloxicam (MOBIC) 15 MG tablet; Take 1 tablet (15 mg total) by mouth daily.  Dispense: 30 tablet; Refill: 0  2. Thrush  - fluconazole (DIFLUCAN) 100 MG tablet; Take 1 tablet (100 mg total) by mouth daily for 7 days.  Dispense: 7 tablet; Refill: 0  3. Cellulitis of right leg without foot Greatly improved. Is to finish home IV antibiotic.   4. Post-nasal drip  - azelastine (ASTELIN) 0.1 % nasal spray; Place 2 sprays into both nostrils 2 (two) times daily. Use in each nostril as directed  Dispense: 30 mL; Refill: 1       Lelon Huh, MD  [place]

## 2018-08-14 ENCOUNTER — Telehealth: Payer: Self-pay | Admitting: Family Medicine

## 2018-08-14 ENCOUNTER — Ambulatory Visit: Payer: PPO

## 2018-08-14 ENCOUNTER — Encounter: Payer: Self-pay | Admitting: *Deleted

## 2018-08-14 NOTE — Telephone Encounter (Signed)
Please advise that white blood cell count is normal. Infection is clearing up as anticipated. Can stop iv antibiotic august 29th as originally planned.

## 2018-08-15 NOTE — Telephone Encounter (Signed)
Patient advised and verbally voiced understanding.  

## 2018-08-16 DIAGNOSIS — R0602 Shortness of breath: Secondary | ICD-10-CM | POA: Diagnosis not present

## 2018-08-16 DIAGNOSIS — G4733 Obstructive sleep apnea (adult) (pediatric): Secondary | ICD-10-CM | POA: Diagnosis not present

## 2018-08-27 ENCOUNTER — Encounter: Payer: Self-pay | Admitting: Family Medicine

## 2018-08-27 DIAGNOSIS — M170 Bilateral primary osteoarthritis of knee: Secondary | ICD-10-CM | POA: Diagnosis not present

## 2018-08-27 DIAGNOSIS — M1711 Unilateral primary osteoarthritis, right knee: Secondary | ICD-10-CM | POA: Diagnosis not present

## 2018-08-28 ENCOUNTER — Other Ambulatory Visit: Payer: Self-pay | Admitting: Family Medicine

## 2018-08-28 ENCOUNTER — Other Ambulatory Visit: Payer: Self-pay | Admitting: *Deleted

## 2018-08-28 NOTE — Patient Outreach (Signed)
[place] [facility]) Care Management  08/28/2018  CORRY IHNEN 06-07-1951 [id number]   RN Health Coach attempted #1 follow up outreach call to patient.  Patient was unavailable. HIPPA compliance voicemail message left with return callback number.  Plan: RN will call patient again within 10 business  days.  [place] Care Management [phone]

## 2018-08-28 NOTE — Patient Outreach (Signed)
[place] [facility]) Care Management  08/28/2018   Edward King 08-01-1951 [id number]   [place] received return telephone call from patient.  Hipaa compliance verified. Per patient he has not checked his blood sugar today. Per patient it has been a few weeks since he checked it. Patient had not eaten breakfast today and it is close to noon. Per patient he has cut back a lot on sweets and starches. Patient does not have any symptoms of high or low blood sugar.  Patient has lost some weight. He is still having some discomfort in calf from cat bite. Per patient he is using a cane for now but normally does not use one. 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 Patient  recently started on a CPAP. Patient has a lot of questions regarding the usage. Patient agreed to follow up outreach calls.  Current Medications:  Current Outpatient Medications  Medication Sig Dispense Refill  . azelastine (ASTELIN) 0.1 % nasal spray Place 2 sprays into both nostrils 2 (two) times daily. Use in each nostril as directed 30 mL 1  . buPROPion (WELLBUTRIN SR) 150 MG 12 hr tablet Take 150 mg by mouth daily.    . citalopram (CELEXA) 40 MG tablet Take 40 mg by mouth daily.     . Coenzyme Q10 (COQ-10) 100 MG CAPS Take 100 mg by mouth daily.    . folic acid (FOLVITE) 1 MG tablet Take 1 mg by mouth daily with lunch.     . gabapentin (NEURONTIN) 300 MG capsule TAKE 2 CAPSULES BY MOUTH IN THE MORNING AND 3 CAPSULES AT BEDTIME 450 capsule 2  . glipiZIDE (GLUCOTROL XL) 2.5 MG 24 hr tablet Take 1 tablet (2.5 mg total) by mouth daily. 90 tablet 4  . meloxicam (MOBIC) 15 MG tablet Take 1 tablet (15 mg total) by mouth daily. 30 tablet 0  . mesalamine (LIALDA) 1.2 g EC tablet Take 2.4 g by mouth 2 (two) times daily.     . metFORMIN (GLUCOPHAGE-XR) 500 MG 24 hr tablet TAKE 2 TABLETS (1,000 MG TOTAL) BY MOUTH 2 (TWO) TIMES DAILY. 360 tablet 4  . pravastatin (PRAVACHOL) 40 MG  tablet TAKE 1 TABLET (40 MG TOTAL) BY MOUTH DAILY. (Patient taking differently: Take 40 mg by mouth daily with lunch. ) 30 tablet 12  . predniSONE (DELTASONE) 5 MG tablet Take 5 mg by mouth every other day.     . valsartan-hydrochlorothiazide (DIOVAN-HCT) 80-12.5 MG tablet Take 1 tablet by mouth daily. 90 tablet 4  . Vitamin D, Cholecalciferol, 1000 units TABS Take 1,000 Units by mouth daily.     No current facility-administered medications for this visit.     Functional Status:  In your present state of health, do you have any difficulty performing the following activities: 08/28/2018 08/01/2018  Hearing? N N  Comment - -  Vision? Y N  Difficulty concentrating or making decisions? - N  Walking or climbing stairs? Y N  Dressing or bathing? N N  Doing errands, shopping? N N  Preparing Food and eating ? N -  Using the Toilet? N -  In the past six months, have you accidently leaked urine? N -  Do you have problems with loss of bowel control? N -  Managing your Medications? N -  Managing your Finances? N -  Housekeeping or managing your Housekeeping? Y -  Some recent data might be hidden    Fall/Depression Screening: Fall Risk  08/28/2018 05/22/2018 05/09/2017  Falls in the past year? No No No  Number falls in past yr: - - -  Injury with Fall? - - -  Risk for fall due to : - - -  Risk for fall due to: Comment - - -  Follow up - - -   PHQ 2/9 Scores 08/28/2018 05/22/2018 05/09/2017 11/27/2016 10/13/2016  PHQ - 2 Score 2 2 2  0 3  PHQ- 9 Score 6 6 7  - 9   THN CM Care Plan Problem One     Most Recent Value  Care Plan Problem One  Knowl, edge Deficit in Self Management of Diabetes  Role Documenting the Problem One  [place] for Problem One  Active  THN Long Term Goal   Patient will verbalize HGB A1C reduced from 8.0 with the next blood draw  THN Long Term Goal Start Date  08/28/18  Interventions for Problem One Long Term Goal  RN discussed how the fasting blood sugar  affects the A1C. RN discussed the range of fbs from 80-130 to help reach the target range of A1C less than 7.0. RN sent Living well with diabetes booklet. RN sent EMMI education  on A1C. RN willfollow up with further discussion  and teach back  THN CM Short Term Goal #1   Patient will monitor blood glucose and keep provided [facility] journal within the nest 30 days  THN CM Short Term Goal #1 Start Date  08/28/18  Interventions for Short Term Goal #1  RN discussed checking blood sugar as physician ordered. RN discussed writing it down in Calendar book provided by [facility]. RN sent EMMI educational material on Why check your blood sugar. RN will follow up with discussion and teach back   THN CM Short Term Goal #2   Patient will verbalize symptoms and treatment of hypo and hyperglycemia within the next 30 days  THN CM Short Term Goal #2 Start Date  08/28/18  Interventions for Short Term Goal #2  RN discussed symptoms of hypo and hyperglycemia. RN sent a facial sheet of hyper and hypoglycemia. RN will follow up with discussion and teach back  THN CM Short Term Goal #3  Patient will be able to identify foods that are healthy snacks within the next 30 days  THN CM Short Term Goal #3 Start Date  08/28/18  Interventions for Short Tern Goal #3  RN discussed making healthy snack choices. RN sent a list of healthy snacks. RN will follow upwith further discussion and teachback  THN CM Short Term Goal #4  Patient will verbalize making healthy menu choices from fast food takeout restaurants within the next 30 days  THN CM Short Term Goal #4 Start Date  08/28/18  Interventions for Short Term Goal #4  RN disucessed patinet eating out. RN sent patient a list of healthy dine out menu choices. RN will follow up with further discussion and teach back  THN CM Short Term Goal #5   Ptient will verbalize having a better understanding of using his CPAP within the next 30 days  THN CM Short Term Goal #5 Start Date  08/28/18   Interventions for Short Term Goal #5  Patient discussed problems with CPAP and lack of knowledge. RN called and spoke with [employer]. The company will  set a visit with the respiratory therapist to troubleshoot and assist patient. RN sent patient some educational material on the benefits of the CPAP.      Assessment:  Patient has not checked blood sugar today Patient A1C is 8.0 Patient does not eat regular meals Patient has questions regarding CPAP Patient will benefit from [place] telephonic outreach for education and support for diabetes self management.  Plan:  RN discussed checking blood sugars daily  RN sent 2019 Calendar book RN sent EMMI educational material Why check your blood sugar RN sent educational material on Why get your A1C checked RN sent picture sheet of hypo and hyperglycemia RN sent dine out menu RN sent sheet on low Carb snacks RN sent living well with diabetes RN sent assessment and barriers letter to PCP RN sent educational material on CPAP RN contacted [employer] to follow up with patient on CPAP needs and questions RN will follow up within the month of October  Laryn Venning [place] Management [phone]

## 2018-08-30 DIAGNOSIS — L03115 Cellulitis of right lower limb: Secondary | ICD-10-CM | POA: Diagnosis not present

## 2018-08-30 DIAGNOSIS — E1140 Type 2 diabetes mellitus with diabetic neuropathy, unspecified: Secondary | ICD-10-CM | POA: Diagnosis not present

## 2018-08-30 DIAGNOSIS — I10 Essential (primary) hypertension: Secondary | ICD-10-CM | POA: Diagnosis not present

## 2018-08-30 DIAGNOSIS — Z5181 Encounter for therapeutic drug level monitoring: Secondary | ICD-10-CM | POA: Diagnosis not present

## 2018-08-30 DIAGNOSIS — I890 Lymphedema, not elsewhere classified: Secondary | ICD-10-CM

## 2018-08-30 DIAGNOSIS — M1711 Unilateral primary osteoarthritis, right knee: Secondary | ICD-10-CM

## 2018-08-30 DIAGNOSIS — Z452 Encounter for adjustment and management of vascular access device: Secondary | ICD-10-CM | POA: Diagnosis not present

## 2018-08-30 DIAGNOSIS — M316 Other giant cell arteritis: Secondary | ICD-10-CM | POA: Diagnosis not present

## 2018-09-04 DIAGNOSIS — K5010 Crohn's disease of large intestine without complications: Secondary | ICD-10-CM | POA: Diagnosis not present

## 2018-09-09 ENCOUNTER — Other Ambulatory Visit: Payer: Self-pay | Admitting: Family Medicine

## 2018-09-09 DIAGNOSIS — M25561 Pain in right knee: Secondary | ICD-10-CM

## 2018-09-11 ENCOUNTER — Other Ambulatory Visit: Payer: Self-pay | Admitting: Family Medicine

## 2018-09-16 DIAGNOSIS — G4733 Obstructive sleep apnea (adult) (pediatric): Secondary | ICD-10-CM | POA: Diagnosis not present

## 2018-09-16 DIAGNOSIS — R0602 Shortness of breath: Secondary | ICD-10-CM | POA: Diagnosis not present

## 2018-09-27 DIAGNOSIS — F330 Major depressive disorder, recurrent, mild: Secondary | ICD-10-CM | POA: Diagnosis not present

## 2018-09-29 ENCOUNTER — Other Ambulatory Visit: Payer: Self-pay | Admitting: Family Medicine

## 2018-09-29 DIAGNOSIS — R0982 Postnasal drip: Secondary | ICD-10-CM

## 2018-10-01 ENCOUNTER — Encounter: Payer: Self-pay | Admitting: *Deleted

## 2018-10-01 ENCOUNTER — Other Ambulatory Visit: Payer: Self-pay | Admitting: *Deleted

## 2018-10-01 NOTE — Patient Outreach (Signed)
[place] [facility]) Care Management  10/01/2018   Edward King 11-29-1951 [id number]   RN Health Coach telephone call to patient.  Hipaa compliance verified. Per patient he has not received the educational material that was sent by [employer]. RN will send to patient again. Per patient he received a new mask from [place] that does not fit. Patient has not been using his CPAP. RN discussed with patient the risks of not using his CPAP. Patient has agreed to further outreach calls.  Current Medications:  Current Outpatient Medications  Medication Sig Dispense Refill  . azelastine (ASTELIN) 0.1 % nasal spray PLACE 2 SPRAYS INTO BOTH NOSTRILS 2 (TWO) TIMES DAILY. USE IN EACH NOSTRIL AS DIRECTED 30 mL 4  . buPROPion (WELLBUTRIN SR) 150 MG 12 hr tablet Take 150 mg by mouth daily.    . citalopram (CELEXA) 40 MG tablet Take 40 mg by mouth daily.     . Coenzyme Q10 (COQ-10) 100 MG CAPS Take 100 mg by mouth daily.    . folic acid (FOLVITE) 1 MG tablet Take 1 mg by mouth daily with lunch.     . gabapentin (NEURONTIN) 300 MG capsule TAKE 2 CAPSULES BY MOUTH IN THE MORNING AND 3 CAPSULES AT BEDTIME 450 capsule 12  . glipiZIDE (GLUCOTROL XL) 2.5 MG 24 hr tablet Take 1 tablet (2.5 mg total) by mouth daily. 90 tablet 4  . meloxicam (MOBIC) 15 MG tablet TAKE 1 TABLET BY MOUTH EVERY DAY 30 tablet 3  . mesalamine (LIALDA) 1.2 g EC tablet Take 2.4 g by mouth 2 (two) times daily.     . metFORMIN (GLUCOPHAGE-XR) 500 MG 24 hr tablet TAKE 2 TABLETS (1,000 MG TOTAL) BY MOUTH 2 (TWO) TIMES DAILY. 360 tablet 4  . pravastatin (PRAVACHOL) 40 MG tablet TAKE 1 TABLET (40 MG TOTAL) BY MOUTH DAILY. (Patient taking differently: Take 40 mg by mouth daily with lunch. ) 30 tablet 12  . predniSONE (DELTASONE) 5 MG tablet Take 5 mg by mouth every other day.     . valsartan-hydrochlorothiazide (DIOVAN-HCT) 80-12.5 MG tablet Take 1 tablet by mouth daily. 90 tablet 4  . Vitamin D, Cholecalciferol, 1000 units TABS Take  1,000 Units by mouth daily.     No current facility-administered medications for this visit.     Functional Status:  In your present state of health, do you have any difficulty performing the following activities: 08/28/2018 08/01/2018  Hearing? N N  Comment - -  Vision? Y N  Difficulty concentrating or making decisions? - N  Walking or climbing stairs? Y N  Dressing or bathing? N N  Doing errands, shopping? N N  Preparing Food and eating ? N -  Using the Toilet? N -  In the past six months, have you accidently leaked urine? N -  Do you have problems with loss of bowel control? N -  Managing your Medications? N -  Managing your Finances? N -  Housekeeping or managing your Housekeeping? Y -  Some recent data might be hidden    Fall/Depression Screening: Fall Risk  08/28/2018 05/22/2018 05/09/2017  Falls in the past year? No No No  Number falls in past yr: - - -  Injury with Fall? - - -  Risk for fall due to : - - -  Risk for fall due to: Comment - - -  Follow up - - -   PHQ 2/9 Scores 08/28/2018 05/22/2018 05/09/2017 11/27/2016 10/13/2016  PHQ - 2 Score 2 2  2 0 3  PHQ- 9 Score 6 6 7  - 9    Assessment:  Per patient his CPA mask does not fit Patient has not been wearing CPAP Per patient he has not received the educational material or calendar book   Plan: RN resent educational information RN resent Calendar book RN notified [employer] and requested Respiratory therapist to contact patient RN will follow up within the month of November  Lynsi Dooner [place] Management [phone]

## 2018-10-14 ENCOUNTER — Ambulatory Visit: Payer: Self-pay | Admitting: Family Medicine

## 2018-10-16 DIAGNOSIS — R0602 Shortness of breath: Secondary | ICD-10-CM | POA: Diagnosis not present

## 2018-10-16 DIAGNOSIS — G4733 Obstructive sleep apnea (adult) (pediatric): Secondary | ICD-10-CM | POA: Diagnosis not present

## 2018-10-22 ENCOUNTER — Ambulatory Visit (INDEPENDENT_AMBULATORY_CARE_PROVIDER_SITE_OTHER): Payer: PPO | Admitting: Family Medicine

## 2018-10-22 ENCOUNTER — Encounter: Payer: Self-pay | Admitting: Family Medicine

## 2018-10-22 VITALS — BP 124/66 | HR 84 | Temp 98.2°F | Resp 16 | Wt 324.0 lb

## 2018-10-22 DIAGNOSIS — E1149 Type 2 diabetes mellitus with other diabetic neurological complication: Secondary | ICD-10-CM

## 2018-10-22 DIAGNOSIS — I10 Essential (primary) hypertension: Secondary | ICD-10-CM

## 2018-10-22 DIAGNOSIS — Z9989 Dependence on other enabling machines and devices: Secondary | ICD-10-CM | POA: Diagnosis not present

## 2018-10-22 DIAGNOSIS — G4733 Obstructive sleep apnea (adult) (pediatric): Secondary | ICD-10-CM

## 2018-10-22 DIAGNOSIS — Z23 Encounter for immunization: Secondary | ICD-10-CM

## 2018-10-22 LAB — POCT GLYCOSYLATED HEMOGLOBIN (HGB A1C): Hemoglobin A1C: 7.7 % — AB (ref 4.0–5.6)

## 2018-10-22 MED ORDER — DULAGLUTIDE 0.75 MG/0.5ML ~~LOC~~ SOAJ: 0.5000 mL | SUBCUTANEOUS | 2 refills | Status: DC

## 2018-10-22 MED ORDER — GLUCOSE BLOOD VI STRP: ORAL_STRIP | 4 refills | Status: DC

## 2018-10-22 NOTE — Progress Notes (Signed)
Patient: Edward King Male    DOB: 04-12-1951   67 y.o.   MRN: [id number] Visit Date: 10/22/2018  Today's Provider: Lelon Huh, MD   Chief Complaint  Patient presents with  . Hypertension  . Diabetes   Subjective:    HPI   Diabetes Mellitus Type II, Follow-up:   Lab Results  Component Value Date   HGBA1C 8.0 (H) 05/22/2018   HGBA1C 6.8 11/12/2017   HGBA1C 6.4 (H) 05/11/2017   Last seen for diabetes 5 months ago.  Management since then includes None. He reports excellent compliance with treatment. He is not having side effects.  Current symptoms include none and have been stable. Home blood sugar records: fasting range: low-mid 100's  Episodes of hypoglycemia? no   Current Insulin Regimen: None Most Recent Eye Exam: UTD Weight trend: stable Prior visit with dietician: no Current diet: in general, a "healthy" diet  , diabetic Current exercise: none  ------------------------------------------------------------------------   Hypertension, follow-up:  BP Readings from Last 3 Encounters:  10/22/18 124/66  08/12/18 127/80  08/05/18 128/71    He was last seen for hypertension 5 months ago.  BP at that visit was 136/76. Management since that visit includes No changes He reports excellent compliance with treatment. He is not having side effects.  He is not exercising. He is adherent to low salt diet.   Outside blood pressures are . He is experiencing none.  Patient denies chest pain, exertional chest pressure/discomfort, fatigue, lower extremity edema and palpitations.   Cardiovascular risk factors include advanced age (older than 51 for men, 11 for women), diabetes mellitus, dyslipidemia, hypertension, male gender and obesity (BMI >= 30 kg/m2).  Use of agents associated with hypertension: none.   ------------------------------------------------------------------------   Wt Readings from Last 5 Encounters:  10/22/18 (!) 324 lb (147 kg)  08/12/18  (!) 327 lb (148.3 kg)  07/31/18 (!) 335 lb (152 kg)  05/22/18 (!) 339 lb 3.2 oz (153.9 kg)  04/01/18 (!) 335 lb (152 kg)    ------------------------------------------------------------------------  Follow up OSA He reports he has gotten new CPAP. Had to try a few masks, but is now able to use it consistently every night and able to keep on all night most nights. Feels a little more energetic. Is noted to have last about 10 pounds since the summer. Is trying to eat healthier.     No Known Allergies   Current Outpatient Medications:  .  azelastine (ASTELIN) 0.1 % nasal spray, PLACE 2 SPRAYS INTO BOTH NOSTRILS 2 (TWO) TIMES DAILY. USE IN EACH NOSTRIL AS DIRECTED, Disp: 30 mL, Rfl: 4 .  buPROPion (WELLBUTRIN SR) 150 MG 12 hr tablet, Take 150 mg by mouth daily., Disp: , Rfl:  .  citalopram (CELEXA) 40 MG tablet, Take 40 mg by mouth daily. , Disp: , Rfl:  .  Coenzyme Q10 (COQ-10) 100 MG CAPS, Take 100 mg by mouth daily., Disp: , Rfl:  .  folic acid (FOLVITE) 1 MG tablet, Take 1 mg by mouth daily with lunch. , Disp: , Rfl:  .  gabapentin (NEURONTIN) 300 MG capsule, TAKE 2 CAPSULES BY MOUTH IN THE MORNING AND 3 CAPSULES AT BEDTIME, Disp: 450 capsule, Rfl: 12 .  glipiZIDE (GLUCOTROL XL) 2.5 MG 24 hr tablet, Take 1 tablet (2.5 mg total) by mouth daily., Disp: 90 tablet, Rfl: 4 .  meloxicam (MOBIC) 15 MG tablet, TAKE 1 TABLET BY MOUTH EVERY DAY, Disp: 30 tablet, Rfl: 3 .  mesalamine (LIALDA)  1.2 g EC tablet, Take 2.4 g by mouth 2 (two) times daily. , Disp: , Rfl:  .  metFORMIN (GLUCOPHAGE-XR) 500 MG 24 hr tablet, TAKE 2 TABLETS (1,000 MG TOTAL) BY MOUTH 2 (TWO) TIMES DAILY., Disp: 360 tablet, Rfl: 4 .  pravastatin (PRAVACHOL) 40 MG tablet, TAKE 1 TABLET (40 MG TOTAL) BY MOUTH DAILY. (Patient taking differently: Take 40 mg by mouth daily with lunch. ), Disp: 30 tablet, Rfl: 12 .  predniSONE (DELTASONE) 5 MG tablet, Take 5 mg by mouth every other day. , Disp: , Rfl:  .  valsartan-hydrochlorothiazide  (DIOVAN-HCT) 80-12.5 MG tablet, Take 1 tablet by mouth daily., Disp: 90 tablet, Rfl: 4 .  Vitamin D, Cholecalciferol, 1000 units TABS, Take 1,000 Units by mouth daily., Disp: , Rfl:   Review of Systems  Constitutional: Negative.   Respiratory: Negative.   Cardiovascular: Negative.   Gastrointestinal: Negative.   Musculoskeletal: Negative.   Neurological: Negative for dizziness, light-headedness and headaches.    Social History   Tobacco Use  . Smoking status: Never Smoker  . Smokeless tobacco: Never Used  Substance Use Topics  . Alcohol use: Yes    Alcohol/week: 0.0 standard drinks    Comment: Rare - 1 drink   Objective:   BP 124/66 (BP Location: Right Arm, Patient Position: Sitting, Cuff Size: Large)   Pulse 84   Temp 98.2 F (36.8 C) (Oral)   Resp 16   Wt (!) 324 lb (147 kg)   BMI 45.19 kg/m  Vitals:   10/22/18 1615  BP: 124/66  Pulse: 84  Resp: 16  Temp: 98.2 F (36.8 C)  TempSrc: Oral  Weight: (!) 324 lb (147 kg)     Physical Exam  General Appearance:    Alert, cooperative, no distress, obese  Eyes:    PERRL, conjunctiva/corneas clear, EOM's intact       Lungs:     Clear to auscultation bilaterally, respirations unlabored  Heart:    Regular rate and rhythm  Neurologic:   Awake, alert, oriented x 3. No apparent focal neurological           defect.            Assessment & Plan:     1. Type 2 diabetes mellitus with other neurologic complication, without long-term current use of insulin (HCC) Stable. Discussed benefits of adding glp1 RA including weight loss. Will try- Dulaglutide (TRULICITY) 2.95 FM/7.3UY SOPN; Inject 0.5 mLs as directed once a week.  Dispense: 2 mL; Refill: 2  Anticipate increasing to 1.5 at follow up if tolerating.  Given new contour next glucometer today since his is no longer functioning.  - glucose blood (CONTOUR NEXT TEST) test strip; Use as instructed to check blood sugar daily  Dispense: 100 each; Refill: 4 - POCT glycosylated  hemoglobin (Hb A1C)  2. Hypertension, unspecified type Well controlled.  Continue current medications.    3. Morbid obesity ([place]) Continue diet and increase physical activity as tolerating. Expect some weight loss with Trulicity.   4. OSA on CPAP Using cpap consistently and medically benefiting from use of device.        Lelon Huh, MD  [facility]

## 2018-10-31 ENCOUNTER — Other Ambulatory Visit: Payer: Self-pay | Admitting: *Deleted

## 2018-11-15 NOTE — Patient Outreach (Signed)
[facility]) Care Management  [id number]  Edward King 11-07-1951 [id number]  RN Health Coach telephone call to patient.  Hipaa compliance verified. Per patient he is doing better. Patient is not checking his blood sugar very often. Patient A1C has decreased to 7.7. RN  Has discussed with patient about checking blood sugar and the documenting. RN has provided the patient with a book to document in. RN is encouraging patient to start a routine exercise. Patient has agreed to follow up outreach calls.   Current Medications:  Current Outpatient Medications  Medication Sig Dispense Refill  . azelastine (ASTELIN) 0.1 % nasal spray PLACE 2 SPRAYS INTO BOTH NOSTRILS 2 (TWO) TIMES DAILY. USE IN EACH NOSTRIL AS DIRECTED 30 mL 4  . buPROPion (WELLBUTRIN SR) 150 MG 12 hr tablet Take 150 mg by mouth daily.    . citalopram (CELEXA) 40 MG tablet Take 40 mg by mouth daily.     . Coenzyme Q10 (COQ-10) 100 MG CAPS Take 100 mg by mouth daily.    . Dulaglutide (TRULICITY) 3.15 VV/6.1YW SOPN Inject 0.5 mLs as directed once a week. 2 mL 2  . folic acid (FOLVITE) 1 MG tablet Take 1 mg by mouth daily with lunch.     . gabapentin (NEURONTIN) 300 MG capsule TAKE 2 CAPSULES BY MOUTH IN THE MORNING AND 3 CAPSULES AT BEDTIME 450 capsule 12  . glipiZIDE (GLUCOTROL XL) 2.5 MG 24 hr tablet Take 1 tablet (2.5 mg total) by mouth daily. 90 tablet 4  . glucose blood (CONTOUR NEXT TEST) test strip Use as instructed to check blood sugar daily 100 each 4  . meloxicam (MOBIC) 15 MG tablet TAKE 1 TABLET BY MOUTH EVERY DAY 30 tablet 3  . mesalamine (LIALDA) 1.2 g EC tablet Take 2.4 g by mouth 2 (two) times daily.     . metFORMIN (GLUCOPHAGE-XR) 500 MG 24 hr tablet TAKE 2 TABLETS (1,000 MG TOTAL) BY MOUTH 2 (TWO) TIMES DAILY. 360 tablet 4  . pravastatin (PRAVACHOL) 40 MG tablet TAKE 1 TABLET (40 MG TOTAL) BY MOUTH DAILY. (Patient taking differently: Take 40 mg by mouth daily with lunch. ) 30 tablet 12  . predniSONE  (DELTASONE) 5 MG tablet Take 5 mg by mouth every other day.     . valsartan-hydrochlorothiazide (DIOVAN-HCT) 80-12.5 MG tablet Take 1 tablet by mouth daily. 90 tablet 4  . Vitamin D, Cholecalciferol, 1000 units TABS Take 1,000 Units by mouth daily.     No current facility-administered medications for this visit.     Functional Status:  In your present state of health, do you have any difficulty performing the following activities: 08/28/2018 08/01/2018  Hearing? N N  Comment - -  Vision? Y N  Difficulty concentrating or making decisions? - N  Walking or climbing stairs? Y N  Dressing or bathing? N N  Doing errands, shopping? N N  Preparing Food and eating ? N -  Using the Toilet? N -  In the past six months, have you accidently leaked urine? N -  Do you have problems with loss of bowel control? N -  Managing your Medications? N -  Managing your Finances? N -  Housekeeping or managing your Housekeeping? Y -  Some recent data might be hidden    Fall/Depression Screening: Fall Risk  08/28/2018 05/22/2018 05/09/2017  Falls in the past year? No No No  Number falls in past yr: - - -  Injury with Fall? - - -  Risk for  fall due to : - - -  Risk for fall due to: Comment - - -  Follow up - - -   PHQ 2/9 Scores 08/28/2018 05/22/2018 05/09/2017 11/27/2016 10/13/2016  PHQ - 2 Score _0 0 3  PHQ- 9 Score _1 - 9   THN CM Care Plan Problem One     Most Recent Value  Care Plan Problem One  Knowledge Deficit in Self Management of Diabetes  Role Documenting the Problem One  [place] for Problem One  Active  THN Long Term Goal   Patient will verbalize HGB A1C reduced from 7.7 with the next blood draw  THN Long Term Goal Start Date  10/31/18  Interventions for Problem One Long Term Goal  RN discussed decrease in A1C from 8.0 to 7.7. RN reiterated what the Fasting blood sugar needs to be for the A1C to be less that 7.0. RN will follow up with further discussion.   THN CM Short Term  Goal #1   Patient will monitor blood glucose and keep provided [facility] journal within the nest 30 days  THN CM Short Term Goal #1 Start Date  10/31/18  Interventions for Short Term Goal #1  Patient has received. RN has to reiterate checking his blood sugars. RN willfollow up for compliance  THN CM Short Term Goal #2   Patient will verbalize symptoms and treatment of hypo and hyperglycemia within the next 30 days  THN CM Short Term Goal #2 Met Date  10/31/18  [facility] CM Short Term Goal #3  Patient will be able to identify foods that are healthy snacks within the next 30 days  THN CM Short Term Goal #3 Start Date  10/31/18  Interventions for Short Tern Goal #3  Patient has received information. RN willcontinue to work with patient to help identify healthy snacks  THN CM Short Term Goal #4  Patient will verbalize making healthy menu choices from fast food takeout restaurants within the next 30 days  THN CM Short Term Goal #4 Start Date  10/31/18  Interventions for Short Term Goal #4  RN willfollow up wtih patient to see if he is using the menu list to choose healthier food  THN CM Short Term Goal #5   Ptient will verbalize having a better understanding of using his CPAP within the next 30 days  THN CM Short Term Goal #5 Start Date  10/31/18  [facility] CM Short Term Goal #5 Met Date  10/31/18  Interventions for Short Term Goal #5  Patient has received new mask from [place]. Per patient he has not spoken with anyone. The mask is fitting better.      Assessment:  A1C 7.7 from 8.0 Patient does not exercise Patient does not check blood sugars daily Per patient he is trying to loose some weight Patient has lost 10 pounds  Plan:  RN encouraging patient to exercise RN encouraging patient to check blood sugars RN has sent a calendar book for documentation RN will follow up within the month of January  [name] [phone]

## 2018-11-16 DIAGNOSIS — R0602 Shortness of breath: Secondary | ICD-10-CM | POA: Diagnosis not present

## 2018-11-16 DIAGNOSIS — G4733 Obstructive sleep apnea (adult) (pediatric): Secondary | ICD-10-CM | POA: Diagnosis not present

## 2018-11-18 DIAGNOSIS — G4733 Obstructive sleep apnea (adult) (pediatric): Secondary | ICD-10-CM | POA: Diagnosis not present

## 2018-11-18 DIAGNOSIS — R0602 Shortness of breath: Secondary | ICD-10-CM | POA: Diagnosis not present

## 2018-11-25 ENCOUNTER — Ambulatory Visit (INDEPENDENT_AMBULATORY_CARE_PROVIDER_SITE_OTHER): Payer: PPO | Admitting: Family Medicine

## 2018-11-25 ENCOUNTER — Encounter: Payer: Self-pay | Admitting: Family Medicine

## 2018-11-25 VITALS — BP 110/80 | HR 88 | Temp 98.2°F | Resp 16 | Wt 311.0 lb

## 2018-11-25 DIAGNOSIS — J0100 Acute maxillary sinusitis, unspecified: Secondary | ICD-10-CM | POA: Diagnosis not present

## 2018-11-25 MED ORDER — DM-GUAIFENESIN ER 30-600 MG PO TB12
1.0000 | ORAL_TABLET | Freq: Two times a day (BID) | ORAL | 0 refills | Status: DC
Start: 2018-11-25 — End: 2019-01-24

## 2018-11-25 MED ORDER — AMOXICILLIN 875 MG PO TABS
875.0000 mg | ORAL_TABLET | Freq: Two times a day (BID) | ORAL | 0 refills | Status: DC
Start: 2018-11-25 — End: 2019-01-24

## 2018-11-25 NOTE — Progress Notes (Signed)
Patient: Edward King Male    DOB: Feb 27, 1951   67 y.o.   MRN: [id number] Visit Date: 11/25/2018  Today's Provider: Vernie Murders, PA   Chief Complaint  Patient presents with  . Cough   Subjective:    Cough  This is a new problem. The current episode started in the past 7 days. The problem has been unchanged. The cough is productive of sputum. Associated symptoms include ear pain, nasal congestion, rhinorrhea, a sore throat and wheezing. Pertinent negatives include no chest pain, chills, ear congestion, fever, heartburn, hemoptysis, myalgias, postnasal drip, rash, shortness of breath, sweats or weight loss. Associated symptoms comments: Right ear pain. The symptoms are aggravated by lying down. Treatments tried: Astelin. The treatment provided no relief. His past medical history is significant for bronchitis and pneumonia.    Patient has had cough and congestion for about 4 days. Patient states before that about 1 week ago he had nasal congestion. Patient states cough is slightly productive. Other symptoms include: right ear pain, runny nose, sore throat, wheezing, and chest tightness. Patient has been using Astelin nasal spray.  Past Medical History:  Diagnosis Date  . Arthritis   . Colitis   . Depression   . Diabetes mellitus without complication ([place])   . 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 History of chicken pox   . History of measles   . History of mumps   . Hypertension   . Lymph edema    both legs.  mostly left  . Neuromuscular disorder ([place])   . Prostate CA ([place]) 2016   Radiation seed implants.  . Stroke ([place])   . Temporal arteritis (HCC)    Clinically dx Dr. Jefm Bryant. Negative temporal artrtery bx   Past Surgical History:  Procedure Laterality Date  . ABDOMINOPLASTY    . Carotid Doppler Ultrasound  08/22/2014   [facility]; Minimal plaque right. Minimal thisckening left Anterograde flow vertebrals  . COLONOSCOPY WITH PROPOFOL N/A 02/13/2018   Procedure:  COLONOSCOPY WITH PROPOFOL;  Surgeon: Manya Silvas, MD;  [facility] END[facility]COPY;  Service: Endoscopy;  Laterality: N/A;  . CT of the head  08/22/2014   [facility]. Normal  . DOPPLER ECHOCARDIOGRAPHY  08/29/2014   EF=60-65%. Normal LVEF  . Myocardial Perfusion Scan  09/10/2012   Poor exercsie tolerance, but no evidence of stress induced myocardial ischemia. EF=66%. Dr. Clayborn Bigness  . NASAL SEPTOPLASTY W/ TURBINOPLASTY Bilateral 02/10/2016   Procedure: NASAL SEPTOPLASTY WITH INFERIOR TURBINATE REDUCTION;  Surgeon: Carloyn Manner, MD;  Location: [facility] ORS;  Service: ENT;  Laterality: Bilateral;  . pannectomy    . TEMPORAL ARTERY BIOPSY / LIGATION Left    Family History  Problem Relation Age of Onset  . Colon cancer Mother   . Coronary artery disease Mother   . Prostate cancer Father   . Kidney disease Father   . Dementia Brother   . Diabetes Neg Hx    No Known Allergies  Current Outpatient Medications:  .  azelastine (ASTELIN) 0.1 % nasal spray, PLACE 2 SPRAYS INTO BOTH NOSTRILS 2 (TWO) TIMES DAILY. USE IN EACH NOSTRIL AS DIRECTED, Disp: 30 mL, Rfl: 4 .  buPROPion (WELLBUTRIN SR) 150 MG 12 hr tablet, Take 150 mg by mouth daily., Disp: , Rfl:  .  citalopram (CELEXA) 40 MG tablet, Take 40 mg by mouth daily. , Disp: , Rfl:  .  Coenzyme Q10 (COQ-10) 100 MG CAPS, Take 100 mg by mouth daily., Disp: , Rfl:  .  Dulaglutide (TRULICITY) 2.13 YQ/6.5HQ SOPN, Inject 0.5 mLs as directed once a week., Disp: 2 mL, Rfl: 2 .  folic acid (FOLVITE) 1 MG tablet, Take 1 mg by mouth daily with lunch. , Disp: , Rfl:  .  gabapentin (NEURONTIN) 300 MG capsule, TAKE 2 CAPSULES BY MOUTH IN THE MORNING AND 3 CAPSULES AT BEDTIME, Disp: 450 capsule, Rfl: 12 .  glipiZIDE (GLUCOTROL XL) 2.5 MG 24 hr tablet, Take 1 tablet (2.5 mg total) by mouth daily., Disp: 90 tablet, Rfl: 4 .  glucose blood (CONTOUR NEXT TEST) test strip, Use as instructed to check blood sugar daily, Disp: 100 each, Rfl: 4 .  meloxicam (MOBIC) 15 MG  tablet, TAKE 1 TABLET BY MOUTH EVERY DAY, Disp: 30 tablet, Rfl: 3 .  metFORMIN (GLUCOPHAGE-XR) 500 MG 24 hr tablet, TAKE 2 TABLETS (1,000 MG TOTAL) BY MOUTH 2 (TWO) TIMES DAILY., Disp: 360 tablet, Rfl: 4 .  pravastatin (PRAVACHOL) 40 MG tablet, TAKE 1 TABLET (40 MG TOTAL) BY MOUTH DAILY. (Patient taking differently: Take 40 mg by mouth daily with lunch. ), Disp: 30 tablet, Rfl: 12 .  predniSONE (DELTASONE) 5 MG tablet, Take 5 mg by mouth every other day. , Disp: , Rfl:  .  valsartan-hydrochlorothiazide (DIOVAN-HCT) 80-12.5 MG tablet, Take 1 tablet by mouth daily., Disp: 90 tablet, Rfl: 4 .  Vitamin D, Cholecalciferol, 1000 units TABS, Take 1,000 Units by mouth daily., Disp: , Rfl:  .  mesalamine (LIALDA) 1.2 g EC tablet, Take 2.4 g by mouth 2 (two) times daily. , Disp: , Rfl:   Review of Systems  Constitutional: Negative for appetite change, chills, fever and weight loss.  HENT: Positive for congestion, ear pain, rhinorrhea and sore throat. Negative for postnasal drip, sinus pressure and sinus pain.   Respiratory: Positive for cough, chest tightness and wheezing. Negative for hemoptysis and shortness of breath.   Cardiovascular: Negative for chest pain and palpitations.  Gastrointestinal: Negative for abdominal pain, heartburn, nausea and vomiting.  Musculoskeletal: Negative for myalgias.  Skin: Negative for rash.   Social History   Tobacco Use  . Smoking status: Never Smoker  . Smokeless tobacco: Never Used  Substance Use Topics  . Alcohol use: Yes    Alcohol/week: 0.0 standard drinks    Comment: Rare - 1 drink   Objective:   BP 110/80 (BP Location: Right Arm, Patient Position: Sitting, Cuff Size: Large)   Pulse 88   Temp 98.2 F (36.8 C) (Oral)   Resp 16   Wt (!) 311 lb (141.1 kg)   SpO2 95%   BMI 43.38 kg/m  Vitals:   11/25/18 1429  BP: 110/80  Pulse: 88  Resp: 16  Temp: 98.2 F (36.8 C)  TempSrc: Oral  SpO2: 95%  Weight: (!) 311 lb (141.1 kg)   Physical Exam    Constitutional: He is oriented to person, place, and time. He appears well-developed and well-nourished. No distress.  HENT:  Head: Normocephalic and atraumatic.  Right Ear: Hearing and external ear normal.  Left Ear: Hearing and external ear normal.  Nose: Nose normal.  Mouth/Throat: Oropharynx is clear and moist.  No transillumination of maxillary sinuses. No significant tenderness.  Eyes: Conjunctivae and lids are normal. Right eye exhibits no discharge. Left eye exhibits no discharge. No scleral icterus.  Neck: Neck supple.  Cardiovascular: Normal rate and regular rhythm.  Pulmonary/Chest: Effort normal and breath sounds normal. No respiratory distress.  Abdominal: Soft.  Musculoskeletal: Normal range of motion.  Lymphadenopathy:    He has  no cervical adenopathy.  Neurological: He is alert and oriented to person, place, and time.  Skin: Skin is intact. No lesion and no rash noted.  Psychiatric: He has a normal mood and affect. His speech is normal and behavior is normal. Thought content normal.      Assessment & Plan:     1. Subacute maxillary sinusitis Onset with PND, stuffy head and cough over the past 4 days. No fever. Some purulent sputum production in the mornings - better by lunch - recurs in the evening. - amoxicillin (AMOXIL) 875 MG tablet; Take 1 tablet (875 mg total) by mouth 2 (two) times daily.  Dispense: 20 tablet; Refill: 0 - dextromethorphan-guaiFENesin (MUCINEX DM) 30-600 MG 12hr tablet; Take 1 tablet by mouth 2 (two) times daily.  Dispense: 20 tablet; Refill: Wolfe, PA  [facility]

## 2018-11-30 ENCOUNTER — Other Ambulatory Visit: Payer: Self-pay | Admitting: Family Medicine

## 2018-11-30 DIAGNOSIS — E1149 Type 2 diabetes mellitus with other diabetic neurological complication: Secondary | ICD-10-CM

## 2018-12-06 DIAGNOSIS — M316 Other giant cell arteritis: Secondary | ICD-10-CM | POA: Diagnosis not present

## 2018-12-25 ENCOUNTER — Other Ambulatory Visit: Payer: Self-pay | Admitting: Family Medicine

## 2018-12-27 DIAGNOSIS — F330 Major depressive disorder, recurrent, mild: Secondary | ICD-10-CM | POA: Diagnosis not present

## 2019-01-01 ENCOUNTER — Other Ambulatory Visit: Payer: Self-pay | Admitting: *Deleted

## 2019-01-01 NOTE — Patient Outreach (Signed)
[facility]) Care Management  01/01/2019   Edward King Jun 17, 1951 [id number]  RN Health Coach telephone call to patient.  Hipaa compliance verified. Per patient he is doing good. Patient did not check his fasting blood sugar today but yesterday was 100. Per patient he has lost 13 know pounds since [id number]. Patient has next follow up appointment in February. Patient stated that he is eating healthier. He is monitoring his carbohydrates and eating less sweet snacks. Patient is walking 1/2 - 1 mile every other day. Per patient he has sent his CPAP back due to the service he received.Patient stated he is not sure that he needs another one.  RN discussed with patient about checking with physician to see if he needs another sleep study or need to order another CPAP machine from another company. Patient has agreed to follow up outreach calls.   Current Medications:  Current Outpatient Medications  Medication Sig Dispense Refill  . amoxicillin (AMOXIL) 875 MG tablet Take 1 tablet (875 mg total) by mouth 2 (two) times daily. 20 tablet 0  . azelastine (ASTELIN) 0.1 % nasal spray PLACE 2 SPRAYS INTO BOTH NOSTRILS 2 (TWO) TIMES DAILY. USE IN EACH NOSTRIL AS DIRECTED 30 mL 4  . buPROPion (WELLBUTRIN SR) 150 MG 12 hr tablet Take 150 mg by mouth daily.    . citalopram (CELEXA) 40 MG tablet Take 40 mg by mouth daily.     . Coenzyme Q10 (COQ-10) 100 MG CAPS Take 100 mg by mouth daily.    Marland Kitchen dextromethorphan-guaiFENesin (MUCINEX DM) 30-600 MG 12hr tablet Take 1 tablet by mouth 2 (two) times daily. 20 tablet 0  . Dulaglutide (TRULICITY) 0.35 WS/5.6CL SOPN Inject 0.5 mLs as directed once a week. 2 mL 2  . folic acid (FOLVITE) 1 MG tablet Take 1 mg by mouth daily with lunch.     . gabapentin (NEURONTIN) 300 MG capsule TAKE 2 CAPSULES BY MOUTH IN THE MORNING AND 3 CAPSULES AT BEDTIME 450 capsule 12  . glipiZIDE (GLUCOTROL XL) 2.5 MG 24 hr tablet TAKE 1 TABLET BY MOUTH EVERY DAY 90 tablet 4  .  glucose blood (CONTOUR NEXT TEST) test strip Use as instructed to check blood sugar daily 100 each 4  . meloxicam (MOBIC) 15 MG tablet TAKE 1 TABLET BY MOUTH EVERY DAY 30 tablet 3  . mesalamine (LIALDA) 1.2 g EC tablet Take 2.4 g by mouth 2 (two) times daily.     . metFORMIN (GLUCOPHAGE-XR) 500 MG 24 hr tablet TAKE 2 TABLETS BY MOUTH TWICE A DAY 360 tablet 4  . pravastatin (PRAVACHOL) 40 MG tablet TAKE 1 TABLET (40 MG TOTAL) BY MOUTH DAILY. (Patient taking differently: Take 40 mg by mouth daily with lunch. ) 30 tablet 12  . predniSONE (DELTASONE) 5 MG tablet Take 5 mg by mouth every other day.     . valsartan-hydrochlorothiazide (DIOVAN-HCT) 80-12.5 MG tablet Take 1 tablet by mouth daily. 90 tablet 4  . Vitamin D, Cholecalciferol, 1000 units TABS Take 1,000 Units by mouth daily.     No current facility-administered medications for this visit.     Functional Status:  In your present state of health, do you have any difficulty performing the following activities: 01/01/2019 08/28/2018  Hearing? - N  Comment - -  Vision? Y Y  Difficulty concentrating or making decisions? N -  Walking or climbing stairs? Y Y  Dressing or bathing? N N  Doing errands, shopping? N N  Preparing Food and eating ?  N N  Using the Toilet? N N  In the past six months, have you accidently leaked urine? N N  Do you have problems with loss of bowel control? N N  Managing your Medications? N N  Managing your Finances? N N  Housekeeping or managing your Housekeeping? Y Y  Some recent data might be hidden    Fall/Depression Screening: Fall Risk  01/01/2019 08/28/2018 05/22/2018  Falls in the past year? 0 No No  Number falls in past yr: - - -  Injury with Fall? - - -  Risk for fall due to : - - -  Risk for fall due to: Comment - - -  Follow up - - -   PHQ 2/9 Scores 01/01/2019 08/28/2018 05/22/2018 05/09/2017 11/27/2016 10/13/2016  PHQ - 2 Score 1 2 2 2  0 3  PHQ- 9 Score 4 6 6 7  - 9    Assessment:  Fasting blood sugar  from yesterday 100 Patient is walking 1/2-1 mile every other day Patient does not have a CPAP machine   Plan:  Patient has been following up with maintenance care Teeth cleaned [id number] Eye exam set [phone] PCP appointment set for February Patient will follow up with PCP regarding CPAP Patient will maintain trying to eat a healthy diet RN will follow up within the month of April  Edward King [place] [facility] [phone]

## 2019-01-03 DIAGNOSIS — M545 Low back pain: Secondary | ICD-10-CM | POA: Diagnosis not present

## 2019-01-09 DIAGNOSIS — M316 Other giant cell arteritis: Secondary | ICD-10-CM | POA: Diagnosis not present

## 2019-01-24 ENCOUNTER — Encounter: Payer: Self-pay | Admitting: Family Medicine

## 2019-01-24 ENCOUNTER — Ambulatory Visit (INDEPENDENT_AMBULATORY_CARE_PROVIDER_SITE_OTHER): Payer: PPO | Admitting: Family Medicine

## 2019-01-24 VITALS — BP 136/84 | HR 82 | Temp 98.5°F | Resp 18 | Wt 314.0 lb

## 2019-01-24 DIAGNOSIS — I10 Essential (primary) hypertension: Secondary | ICD-10-CM

## 2019-01-24 DIAGNOSIS — D649 Anemia, unspecified: Secondary | ICD-10-CM

## 2019-01-24 DIAGNOSIS — I2510 Atherosclerotic heart disease of native coronary artery without angina pectoris: Secondary | ICD-10-CM | POA: Insufficient documentation

## 2019-01-24 DIAGNOSIS — E1149 Type 2 diabetes mellitus with other diabetic neurological complication: Secondary | ICD-10-CM

## 2019-01-24 DIAGNOSIS — G4733 Obstructive sleep apnea (adult) (pediatric): Secondary | ICD-10-CM

## 2019-01-24 LAB — POCT UA - MICROALBUMIN: Microalbumin Ur, POC: 20 mg/L

## 2019-01-24 NOTE — Patient Instructions (Addendum)
.   Please review the attached list of medications and notify my office if there are any errors.   . Please bring all of your medications to every appointment so we can make sure that our medication list is the same as yours.   . I recommend that you take 74m enteric coated aspirin every other day to reduce risk of vascular events such as heart attacks and strokes.    Try fluticasone nasal spray for persistent sinus congestion (Flonase)

## 2019-01-24 NOTE — Progress Notes (Signed)
Patient: Edward King Male    DOB: 14-Jun-1951   68 y.o.   MRN: [id number] Visit Date: 01/24/2019  Today's Provider: Lelon Huh, MD   Chief Complaint  Patient presents with  . Diabetes  . Hypertension  . Hyperlipidemia   Subjective:     HPI  Diabetes Mellitus Type II, Follow-up:   Lab Results  Component Value Date   HGBA1C 7.7 (A) 10/22/2018   HGBA1C 8.0 (H) 05/22/2018   HGBA1C 6.8 11/12/2017   Last seen for diabetes 3 months ago.  Management since then includes adding Trulicity to help with weight loss. He reports good compliance with treatment. He is not having side effects.  Current symptoms include none and have been stable. Home blood sugar records: fasting range: 130-170  Episodes of hypoglycemia? no   Current Insulin Regimen: none Most Recent Eye Exam: 01/21/2018 Weight trend: fluctuating a bit Prior visit with dietician: no Current diet: in general, an "unhealthy" diet Current exercise: none  ------------------------------------------------------------------------   Hypertension, follow-up:  BP Readings from Last 3 Encounters:  01/24/19 136/84  11/25/18 110/80  10/22/18 124/66    He was last seen for hypertension 3 months ago.  BP at that visit was 126/66. Management since that visit includes no changes.He reports good compliance with treatment. He is not having side effects.  He is not exercising. He is adherent to low salt diet.   Outside blood pressures are checked occasional. He is experiencing none.  Patient denies chest pain, chest pressure/discomfort, claudication, dyspnea, exertional chest pressure/discomfort, fatigue, irregular heart beat, lower extremity edema, near-syncope, orthopnea, palpitations, paroxysmal nocturnal dyspnea, syncope and tachypnea.   Cardiovascular risk factors include advanced age (older than 10 for men, 67 for women), diabetes mellitus, dyslipidemia, hypertension and male gender.  Use of agents associated  with hypertension: none.   ------------------------------------------------------------------------    Lipid/Cholesterol, Follow-up:   Last seen for this 8 months ago.  Management since that visit includes no changes.   Lipid Panel     Component Value Date/Time   CHOL 145 05/22/2018 1547   TRIG 98 05/22/2018 1547   HDL 65 05/22/2018 1547   CHOLHDL 2.2 05/22/2018 1547   LDLCALC 60 05/22/2018 1547     He reports good compliance with treatment. He is not having side effects. \  Wt Readings from Last 3 Encounters:  01/24/19 (!) 314 lb (142.4 kg)  11/25/18 (!) 311 lb (141.1 kg)  10/22/18 (!) 324 lb (147 kg)    ------------------------------------------------------------------------  No Known Allergies   Current Outpatient Medications:  .  azelastine (ASTELIN) 0.1 % nasal spray, PLACE 2 SPRAYS INTO BOTH NOSTRILS 2 (TWO) TIMES DAILY. USE IN EACH NOSTRIL AS DIRECTED, Disp: 30 mL, Rfl: 4 .  buPROPion (WELLBUTRIN SR) 150 MG 12 hr tablet, Take 150 mg by mouth daily., Disp: , Rfl:  .  citalopram (CELEXA) 40 MG tablet, Take 40 mg by mouth daily. , Disp: , Rfl:  .  Coenzyme Q10 (COQ-10) 100 MG CAPS, Take 100 mg by mouth daily., Disp: , Rfl:  .  Dulaglutide (TRULICITY) 5.37 SM/2.7MB SOPN, Inject 0.5 mLs as directed once a week., Disp: 2 mL, Rfl: 2 .  folic acid (FOLVITE) 1 MG tablet, Take 1 mg by mouth daily with lunch. , Disp: , Rfl:  .  gabapentin (NEURONTIN) 300 MG capsule, TAKE 2 CAPSULES BY MOUTH IN THE MORNING AND 3 CAPSULES AT BEDTIME, Disp: 450 capsule, Rfl: 12 .  glipiZIDE (GLUCOTROL XL) 2.5 MG 24  hr tablet, TAKE 1 TABLET BY MOUTH EVERY DAY, Disp: 90 tablet, Rfl: 4 .  glucose blood (CONTOUR NEXT TEST) test strip, Use as instructed to check blood sugar daily, Disp: 100 each, Rfl: 4 .  meloxicam (MOBIC) 15 MG tablet, TAKE 1 TABLET BY MOUTH EVERY DAY, Disp: 30 tablet, Rfl: 3 .  mesalamine (LIALDA) 1.2 g EC tablet, Take 2.4 g by mouth 2 (two) times daily. , Disp: , Rfl:  .   metFORMIN (GLUCOPHAGE-XR) 500 MG 24 hr tablet, TAKE 2 TABLETS BY MOUTH TWICE A DAY, Disp: 360 tablet, Rfl: 4 .  pravastatin (PRAVACHOL) 40 MG tablet, TAKE 1 TABLET (40 MG TOTAL) BY MOUTH DAILY. (Patient taking differently: Take 40 mg by mouth daily with lunch. ), Disp: 30 tablet, Rfl: 12 .  predniSONE (DELTASONE) 5 MG tablet, Take 5 mg by mouth every other day. , Disp: , Rfl:  .  valsartan-hydrochlorothiazide (DIOVAN-HCT) 80-12.5 MG tablet, Take 1 tablet by mouth daily., Disp: 90 tablet, Rfl: 4 .  Vitamin D, Cholecalciferol, 1000 units TABS, Take 1,000 Units by mouth daily., Disp: , Rfl:   Review of Systems  Constitutional: Negative for appetite change, chills and fever.  HENT: Positive for congestion (head congestion).   Respiratory: Negative for chest tightness, shortness of breath and wheezing.   Cardiovascular: Negative for chest pain and palpitations.  Gastrointestinal: Negative for abdominal pain, nausea and vomiting.    Social History   Tobacco Use  . Smoking status: Never Smoker  . Smokeless tobacco: Never Used  Substance Use Topics  . Alcohol use: Yes    Alcohol/week: 0.0 standard drinks    Comment: Rare - 1 drink      Objective:   BP 136/84   Pulse 82   Temp 98.5 F (36.9 C) (Oral)   Resp 18   Wt (!) 314 lb (142.4 kg)   SpO2 97% Comment: room air  BMI 43.79 kg/m  Vitals:   01/24/19 1345  BP: 136/84  Pulse: 82  Resp: 18  Temp: 98.5 F (36.9 C)  TempSrc: Oral  SpO2: 97%  Weight: (!) 314 lb (142.4 kg)     Physical Exam  General Appearance:    Alert, cooperative, no distress, obese  Eyes:    PERRL, conjunctiva/corneas clear, EOM's intact       Lungs:     Clear to auscultation bilaterally, respirations unlabored  Heart:    Regular rate and rhythm  Neurologic:   Awake, alert, oriented x 3. No apparent focal neurological           defect.          Assessment & Plan    1. Type 2 diabetes mellitus with other neurologic complication, without long-term  current use of insulin (HCC) Doing well after initiation of Trulicity. Anticipate increasing to 1.26m  - Comprehensive metabolic panel - Lipid panel - Hemoglobin A1c - CBC  2. Hypertension, unspecified type Well controlled.  Continue current medications.   - Comprehensive metabolic panel - Lipid panel  3. OSA (obstructive sleep apnea) Did not tolerate CPAP trial last year. He denies hypersomnia at this time. Will hopefully lose some weight on Trulicity.   4. Anemia, unspecified type  - CBC     DLelon Huh MD  [facility]

## 2019-01-25 LAB — COMPREHENSIVE METABOLIC PANEL
A/G RATIO: 1.3 (ref 1.2–2.2)
ALK PHOS: 97 IU/L (ref 39–117)
ALT: 11 IU/L (ref 0–44)
AST: 12 IU/L (ref 0–40)
Albumin: 3.8 g/dL (ref 3.8–4.8)
BILIRUBIN TOTAL: 0.3 mg/dL (ref 0.0–1.2)
BUN/Creatinine Ratio: 20 (ref 10–24)
BUN: 20 mg/dL (ref 8–27)
CHLORIDE: 99 mmol/L (ref 96–106)
CO2: 25 mmol/L (ref 20–29)
Calcium: 9.5 mg/dL (ref 8.6–10.2)
Creatinine, Ser: 1.02 mg/dL (ref 0.76–1.27)
GFR calc non Af Amer: 75 mL/min/{1.73_m2} (ref >59)
GFR, EST AFRICAN AMERICAN: 87 mL/min/{1.73_m2} (ref >59)
Globulin, Total: 2.9 g/dL (ref 1.5–4.5)
Glucose: 123 mg/dL — ABNORMAL HIGH (ref 65–99)
POTASSIUM: 4.3 mmol/L (ref 3.5–5.2)
Sodium: 140 mmol/L (ref 134–144)
Total Protein: 6.7 g/dL (ref 6.0–8.5)

## 2019-01-25 LAB — CBC
Hematocrit: 42 % (ref 37.5–51.0)
Hemoglobin: 13.9 g/dL (ref 13.0–17.7)
MCH: 27.5 pg (ref 26.6–33.0)
MCHC: 33.1 g/dL (ref 31.5–35.7)
MCV: 83 fL (ref 79–97)
Platelets: 306 10*3/uL (ref 150–450)
RBC: 5.06 x10E6/uL (ref 4.14–5.80)
RDW: 14.2 % (ref 11.6–15.4)
WBC: 11.7 10*3/uL — AB (ref 3.4–10.8)

## 2019-01-25 LAB — LIPID PANEL
Chol/HDL Ratio: 2.6 ratio (ref 0.0–5.0)
Cholesterol, Total: 148 mg/dL (ref 100–199)
HDL: 58 mg/dL (ref >39)
LDL Calculated: 69 mg/dL (ref 0–99)
Triglycerides: 107 mg/dL (ref 0–149)
VLDL Cholesterol Cal: 21 mg/dL (ref 5–40)

## 2019-01-25 LAB — HEMOGLOBIN A1C
Est. average glucose Bld gHb Est-mCnc: 163 mg/dL
Hgb A1c MFr Bld: 7.3 % — ABNORMAL HIGH (ref 4.8–5.6)

## 2019-01-27 ENCOUNTER — Other Ambulatory Visit: Payer: Self-pay | Admitting: Family Medicine

## 2019-01-27 ENCOUNTER — Telehealth: Payer: Self-pay

## 2019-01-27 DIAGNOSIS — E1149 Type 2 diabetes mellitus with other diabetic neurological complication: Secondary | ICD-10-CM

## 2019-01-27 MED ORDER — DULAGLUTIDE 1.5 MG/0.5ML ~~LOC~~ SOAJ: 1.5000 mg | SUBCUTANEOUS | 2 refills | Status: DC

## 2019-01-27 MED ORDER — DULAGLUTIDE 0.75 MG/0.5ML ~~LOC~~ SOAJ: 1.5000 mL | SUBCUTANEOUS | 2 refills | Status: DC

## 2019-01-27 NOTE — Telephone Encounter (Signed)
Patient advised as below. Patient verbalizes understanding and is in agreement with treatment plan.  

## 2019-01-27 NOTE — Addendum Note (Signed)
Addended by: Birdie Sons on: 01/27/2019 12:18 PM   Modules accepted: Orders

## 2019-01-27 NOTE — Telephone Encounter (Signed)
-----   Message from Birdie Sons, MD sent at 01/27/2019  7:52 AM EST ----- a1c is a little better at 7.3%. cholesterol is good at 148.  Increase Trulicity to 8.5ID weekly. Continue other medications. Please send prescription to pharmacy of his choice.  Schedule follow up in 4 months.

## 2019-01-29 DIAGNOSIS — H2513 Age-related nuclear cataract, bilateral: Secondary | ICD-10-CM | POA: Diagnosis not present

## 2019-01-29 LAB — HM DIABETES EYE EXAM

## 2019-02-03 ENCOUNTER — Encounter: Payer: Self-pay | Admitting: Family Medicine

## 2019-02-06 DIAGNOSIS — M7061 Trochanteric bursitis, right hip: Secondary | ICD-10-CM | POA: Diagnosis not present

## 2019-02-06 DIAGNOSIS — M545 Low back pain: Secondary | ICD-10-CM | POA: Diagnosis not present

## 2019-03-05 DIAGNOSIS — K5010 Crohn's disease of large intestine without complications: Secondary | ICD-10-CM | POA: Diagnosis not present

## 2019-03-11 ENCOUNTER — Other Ambulatory Visit: Payer: Self-pay | Admitting: Family Medicine

## 2019-03-13 DIAGNOSIS — M316 Other giant cell arteritis: Secondary | ICD-10-CM | POA: Diagnosis not present

## 2019-04-01 ENCOUNTER — Other Ambulatory Visit: Payer: Self-pay | Admitting: *Deleted

## 2019-04-01 NOTE — Patient Outreach (Signed)
[facility]) Care Management  04/01/2019   Edward King [birth year] [id number]  RN Health Coach telephone call to patient.  [employer]aa compliance verified. Per patient he is doing well. A1C is 7.3. FBS 120. Patient stated that he is getting out and using safety precautions wearing a mask and washing hands. Per patient he is in a donut hole and it is costly affording his medications. Patient has been keeping his medical appointments. Patient has agreed to further outreach calls.   Current Medications:  Current Outpatient Medications  Medication Sig Dispense Refill  . azelastine (ASTELIN) 0.1 % nasal spray PLACE 2 SPRAYS INTO BOTH NOSTRILS 2 (TWO) TIMES DAILY. USE IN EACH NOSTRIL AS DIRECTED 30 mL 4  . buPROPion (WELLBUTRIN SR) 150 MG 12 hr tablet Take 150 mg by mouth daily.    . citalopram (CELEXA) 40 MG tablet Take 40 mg by mouth daily.     . Coenzyme Q10 (COQ-10) 100 MG CAPS Take 100 mg by mouth daily.    . Dulaglutide (TRULICITY) 1.5 RW/4.3XV SOPN Inject 1.5 mg into the skin once a week. 4 pen 2  . folic acid (FOLVITE) 1 MG tablet Take 1 mg by mouth daily with lunch.     . gabapentin (NEURONTIN) 300 MG capsule TAKE 2 CAPSULES BY MOUTH IN THE MORNING AND 3 CAPSULES AT BEDTIME 450 capsule 12  . glipiZIDE (GLUCOTROL XL) 2.5 MG 24 hr tablet TAKE 1 TABLET BY MOUTH EVERY DAY 90 tablet 4  . glucose blood (CONTOUR NEXT TEST) test strip Use as instructed to check blood sugar daily 100 each 4  . meloxicam (MOBIC) 15 MG tablet TAKE 1 TABLET BY MOUTH EVERY DAY 30 tablet 3  . mesalamine (LIALDA) 1.2 g EC tablet Take 2.4 g by mouth 2 (two) times daily.     . metFORMIN (GLUCOPHAGE-XR) 500 MG 24 hr tablet TAKE 2 TABLETS BY MOUTH TWICE A DAY 360 tablet 4  . pravastatin (PRAVACHOL) 40 MG tablet Take 1 tablet (40 mg total) by mouth daily with lunch. 90 tablet 4  . predniSONE (DELTASONE) 5 MG tablet Take 5 mg by mouth every other day.     . valsartan-hydrochlorothiazide (DIOVAN-HCT) 80-12.5 MG  tablet Take 1 tablet by mouth daily. 90 tablet 4  . Vitamin D, Cholecalciferol, 1000 units TABS Take 1,000 Units by mouth daily.     No current facility-administered medications for this visit.     Functional Status:  In your present state of health, do you have any difficulty performing the following activities: 01/01/2019 08/28/2018  Hearing? - N  Comment - -  Vision? Y Y  Difficulty concentrating or making decisions? N -  Walking or climbing stairs? Y Y  Dressing or bathing? N N  Doing errands, shopping? N N  Preparing Food and eating ? N N  Using the Toilet? N N  In the past six months, have you accidently leaked urine? N N  Do you have problems with loss of bowel control? N N  Managing your Medications? N N  Managing your Finances? N N  Housekeeping or managing your Housekeeping? Y Y  Some recent data might be hidden    Fall/Depression Screening: Fall Risk  04/01/2019 01/01/2019 08/28/2018  Falls in the past year? 0 0 No  Number falls in past yr: - - -  Injury with Fall? - - -  Risk for fall due to : - - -  Risk for fall due to: Comment - - -  Follow  up - - -   PHQ 2/9 Scores 04/01/2019 01/01/2019 08/28/2018 05/22/2018 05/09/2017 11/27/2016 10/13/2016  PHQ - 2 Score 1 1 2 2 2  0 3  PHQ- 9 Score - 4 6 6 7  - 9   THN CM Care Plan Problem One     Most Recent Value  Care Plan Problem One  Knowledge Deficit in Self Management of Diabetes  Role Documenting the Problem One  [place] for Problem One  Active  THN Long Term Goal   Patient will verbalize HGB A1C reduced from 7.3 with the next blood draw  THN Long Term Goal Start Date  04/01/19  Interventions for Problem One Long Term Goal  RN congratulated patient on current decrease in blood sugar. RN encouraged further reduction. RN will follow up with further discussion  THN CM Short Term Goal #1   Patient will monitor blood glucose and keep provided [facility] journal within the nest 30 days  Interventions for Short Term Goal  #1  Patient staates he is checking blood sugars. RN will follow up for compliance     Assessment:  A1C 7.3 down from 7.3 FBS 120 Patient is in the donut hole Patient is using safety measures of mask and washing hands during plandemic Patient has adequate food during pandemic Plan:  Referred to [place] discussed A1C progress RN discussed medication adherence RN reiterated checking blood sugars as per Dr order RN will follow up within the month of July  Edward King [name] Management [phone]

## 2019-04-02 ENCOUNTER — Other Ambulatory Visit: Payer: Self-pay | Admitting: Pharmacist

## 2019-04-02 NOTE — Patient Outreach (Signed)
[facility]) Care Management  [place]   04/02/2019  Edward King Mar 16, 1951 [id number]  Reason for referral: Medication Assistance  Referral source: [place] Current insurance: [employer]  PMHx includes but not limited to:  Crohn's Disease, depression, T2DM, HTN, prostate cancer, temporal arteritis, sleep apnea  Outreach:  Successful telephone call with Edward King.  HIPAA identifiers verified.    Subjective:  Patient reports keeping medications in the bottle and does not use pillbox or compliance packs.  He reports managing medications by himself and not having issues with transportation to pharmacy.  He states he is about to go into the coverage gap and cannot afford Trulicity and Apriso.  He is completely out of both medications.  He states copay for Apriso is ~$200 / month.   Objective: Lab Results  Component Value Date   CREATININE 1.02 01/24/2019   CREATININE 0.85 08/02/2018   CREATININE 1.04 08/01/2018    Lab Results  Component Value Date   HGBA1C 7.3 (H) 01/24/2019    Lipid Panel     Component Value Date/Time   CHOL 148 01/24/2019 1420   CHOL 174 08/30/2014 0450   TRIG 107 01/24/2019 1420   TRIG 73 08/30/2014 0450   HDL 58 01/24/2019 1420   HDL 40 08/30/2014 0450   CHOLHDL 2.6 01/24/2019 1420   VLDL 15 08/30/2014 0450   LDLCALC 69 01/24/2019 1420   LDLCALC 119 (H) 08/30/2014 0450    BP Readings from Last 3 Encounters:  01/24/19 136/84  11/25/18 110/80  10/22/18 124/66    No Known Allergies  Medications Reviewed Today    Reviewed by Verlin Grills, RN (Case Manager) on 04/01/19 at 1700  Med List Status: <None>  Medication Order Taking? Sig Documenting Provider Last Dose Status Informant  azelastine (ASTELIN) 0.1 % nasal spray [id number] No PLACE 2 SPRAYS INTO BOTH NOSTRILS 2 (TWO) TIMES DAILY. USE IN EACH NOSTRIL AS DIRECTED Birdie Sons, MD Taking Active   buPROPion [facility] SR) 150 MG 12  hr tablet [id number] No Take 150 mg by mouth daily. [provider] Taking Active Spouse/Significant Other  citalopram (CELEXA) 40 MG tablet [id number] No Take 40 mg by mouth daily.  [provider] Taking Active Spouse/Significant Other           Med Note Kathlen Mody, ASHLEY M   Wed Jul 31, 2018  7:09 PM)    Coenzyme Q10 (COQ-10) 100 MG CAPS [id number] No Take 100 mg by mouth daily. [provider] Taking Active Spouse/Significant Other  Dulaglutide (TRULICITY) 1.5 UV/2.5DG SOPN [id number]  Inject 1.5 mg into the skin once a week. Birdie Sons, MD  Active   folic acid (FOLVITE) 1 MG tablet [id number] No Take 1 mg by mouth daily with lunch.  [provider] Taking Active Spouse/Significant Other           Med Note Kathlen Mody, Renato Gails   Wed Jul 31, 2018  7:10 PM)    gabapentin (NEURONTIN) 300 MG capsule [id number] No TAKE 2 CAPSULES BY MOUTH IN THE MORNING AND 3 CAPSULES AT BEDTIME Birdie Sons, MD Taking Active   glipiZIDE (GLUCOTROL XL) 2.5 MG 24 hr tablet [id number] No TAKE 1 TABLET BY MOUTH EVERY DAY Birdie Sons, MD Taking Active   glucose blood (CONTOUR NEXT TEST) test strip [id number] No Use as instructed to check blood sugar daily Birdie Sons, MD Taking Active   meloxicam (MOBIC) 15 MG tablet [id number] No TAKE  1 TABLET BY MOUTH EVERY DAY Birdie Sons, MD Taking Active   mesalamine (LIALDA) 1.2 g EC tablet [id number] No Take 2.4 g by mouth 2 (two) times daily.  [provider] Taking Active Spouse/Significant Other  metFORMIN (GLUCOPHAGE-XR) 500 MG 24 hr tablet [id number] No TAKE 2 TABLETS BY MOUTH TWICE A DAY [place], Kirstie Peri, MD Taking Active   pravastatin (PRAVACHOL) 40 MG tablet [id number]  Take 1 tablet (40 mg total) by mouth daily with lunch. Birdie Sons, MD  Active   predniSONE (DELTASONE) 5 MG tablet [id number] No Take 5 mg by mouth every other day.  [provider] Taking Active Spouse/Significant Other           Med Note  Isabella Bowens Apr 17, 2016 11:20 AM)    valsartan-hydrochlorothiazide (DIOVAN-HCT) 80-12.5 MG tablet [id number] No Take 1 tablet by mouth daily. Birdie Sons, MD Taking Active   Vitamin D, Cholecalciferol, 1000 units TABS [id number] No Take 1,000 Units by mouth daily. [provider] Taking Active Self          Assessment:  Drugs sorted by system:  Neurologic/Psychologic:gabapentin, citalopram, bupropion  Cardiovascular: pravastatin, valsartan-HCTZ  Gastrointestinal: mesalamine (Apriso)  Endocrinology: Trulicity, glipizide XL, metformin XR, prednisone  Vitamins/Minerals/Supplements: coenzyme-Q10, vitamin D  Medication Review Findings:  -No medication interactions / issues identified  Medication Assistance Findings:  Medication assistance needs identified. Trulicity + Apriso  Extra Help:  Not eligible for Extra Help Low Income Subsidy based on reported income and assets  Patient Assistance Programs: Apriso made by [place] requirement met: Yes o Out-of-pocket prescription expenditure met:   Not Applicable - Patient has met application requirements to apply for this patient assistance program.   - Reviewed program requirements with patients.    Trulicity made by [place] requirement met: Yes o Out-of-pocket prescription expenditure met:   Not Applicable - Patient has met application requirements to apply for this patient assistance program.   - Reviewed program requirements with patients.    Plan: . I will route patient assistance letter to [place] technician who will coordinate patient assistance program application process for medications listed above.  [facility] pharmacy technician will assist with obtaining all required documents from both patient and provider(s) and submit application(s) once completed.   Ralene Bathe, PharmD, [place] [phone]

## 2019-04-04 ENCOUNTER — Other Ambulatory Visit: Payer: Self-pay | Admitting: Pharmacy Technician

## 2019-04-04 NOTE — Patient Outreach (Signed)
[place] [facility]) Care Management  04/04/2019  Edward King 10/20/1951 [id number]                          Medication Assistance Referral  Referral From: [place] / [place] Patient application portion:  Mailed Provider application portion: Faxed  to Dr. Vira Agar  Medication/Company: Danelle Berry / [employer] Patient application portion:  Mailed Provider application portion: Faxed  to [place]   Follow up:  Will follow up with patient in 7-10 business days to confirm application(s) have been received.  Maud Deed Chana Bode [place] Certified Pharmacy Technician [place] Management Direct Dial:[phone]

## 2019-04-30 ENCOUNTER — Other Ambulatory Visit: Payer: Self-pay | Admitting: Pharmacy Technician

## 2019-04-30 NOTE — Patient Outreach (Signed)
[facility]) Care Management  04/30/2019  Edward King 1951-06-13 [id number]    Successful call placed to Edward King regarding patient assistance application(s) for Trulicity and Apriso , HIPAA identifiers verified. Edward King states that he nor his wife have received patient assistance applications that were mailed out to them. Will prepare new set of applications to be mailed out.  Follow up:  Will follow up with Edward King in 7-10 business days to confirm applications have been received.  Maud Deed Chana Bode [place] Certified Pharmacy Technician [place] Management Direct Dial:[phone]

## 2019-05-06 ENCOUNTER — Other Ambulatory Visit: Payer: Self-pay | Admitting: *Deleted

## 2019-05-06 NOTE — Patient Outreach (Signed)
[place] [facility]) Care Management  05/06/2019  Edward King 1951-03-05 [id number]   RN Health Coach case closure due to patient enrolled in external [employer] program.  Plan: Closure letter sent to PCP and patient  [place] Management [phone]

## 2019-05-10 ENCOUNTER — Other Ambulatory Visit: Payer: Self-pay | Admitting: Family Medicine

## 2019-05-28 ENCOUNTER — Encounter: Payer: PPO | Admitting: Family Medicine

## 2019-05-28 ENCOUNTER — Ambulatory Visit: Payer: PPO

## 2019-05-30 ENCOUNTER — Other Ambulatory Visit: Payer: Self-pay | Admitting: Pharmacist

## 2019-05-30 NOTE — Patient Outreach (Signed)
[place] [facility]) Care Management  [place] 05/30/2019  Edward King [birth year] [id number]  Reason for call: f/u on patient assistance program applications mailed to patient in April and May.  Application forms have not yet been returned.   Successful call with Edward King.  Patient reports he has the applications for Trulicity and Apriso but thinks that he and his spouse have income over the required amount for programs.  We reviewed his income and patient does still meet requirements for Trulicity but not for Apriso.  Patient voiced understanding and states he will complete application this weekend.    Plan: I will call patient on Monday to see if he has any questions on applications and to verify he will mail back to [facility].   Ralene Bathe, PharmD, [place] [phone]

## 2019-06-02 ENCOUNTER — Ambulatory Visit: Payer: Self-pay | Admitting: Pharmacist

## 2019-06-02 ENCOUNTER — Other Ambulatory Visit: Payer: Self-pay | Admitting: Pharmacist

## 2019-06-02 NOTE — Patient Outreach (Signed)
[place] [facility]) Care Management  [place] 06/02/2019  URBAN NAVAL May 09, 1951 [id number]  Reason for call: f/u on patient assistance application for Trulicity  Outreach:  Unsuccessful telephone call attempt #1 to patient. HIPAA compliant voicemail left requesting a return call  Plan:  -I will make another outreach attempt to patient within 3-4 business days.    Ralene Bathe, PharmD, [place] [phone]

## 2019-06-04 ENCOUNTER — Ambulatory Visit: Payer: Self-pay | Admitting: Pharmacist

## 2019-06-04 ENCOUNTER — Other Ambulatory Visit: Payer: Self-pay | Admitting: Pharmacist

## 2019-06-04 NOTE — Patient Outreach (Signed)
[place] [facility]) Care Management  [place] 06/04/2019  Edward King 02-01-1951 [id number]  Reason for call: f/u call on patient assistance program (PAP) application for Trulicity.  Patient reports he has the applications at home for the Trulicity PAP and will be able to return both his and his spouses applications to [facility].  He is aware to include proof of income.  THN will await return of documents from patient and spouse.    Ralene Bathe, PharmD, [place] [phone]

## 2019-06-17 DIAGNOSIS — M316 Other giant cell arteritis: Secondary | ICD-10-CM | POA: Diagnosis not present

## 2019-06-25 ENCOUNTER — Other Ambulatory Visit: Payer: Self-pay | Admitting: Pharmacist

## 2019-06-25 NOTE — Patient Outreach (Signed)
[place] [facility]) Care Management [place]  06/25/2019  Edward King 12/15/1951 [id number]  Patient assistance application forms for Trulicity have not been returned to [facility]. Unsuccessful call to patient today to discuss.  HIPAA compliant voicemail left.   [facility] pharmacy case is being closed due to the following reasons:  -Patient has not returned patient assistance programs despite multiple discussions and remailing applications to household.  -Am happy to assist in the future if patient willing to engage with [place], PharmD, [place] [phone]

## 2019-06-26 ENCOUNTER — Ambulatory Visit: Payer: Self-pay | Admitting: Pharmacist

## 2019-07-01 ENCOUNTER — Ambulatory Visit: Payer: PPO | Admitting: *Deleted

## 2019-07-22 DIAGNOSIS — G4733 Obstructive sleep apnea (adult) (pediatric): Secondary | ICD-10-CM | POA: Diagnosis not present

## 2019-07-22 DIAGNOSIS — Z6841 Body Mass Index (BMI) 40.0 and over, adult: Secondary | ICD-10-CM | POA: Diagnosis not present

## 2019-07-22 DIAGNOSIS — E119 Type 2 diabetes mellitus without complications: Secondary | ICD-10-CM | POA: Diagnosis not present

## 2019-07-22 DIAGNOSIS — I10 Essential (primary) hypertension: Secondary | ICD-10-CM | POA: Diagnosis not present

## 2019-07-22 DIAGNOSIS — I209 Angina pectoris, unspecified: Secondary | ICD-10-CM | POA: Diagnosis not present

## 2019-07-22 DIAGNOSIS — I890 Lymphedema, not elsewhere classified: Secondary | ICD-10-CM | POA: Diagnosis not present

## 2019-07-22 DIAGNOSIS — F329 Major depressive disorder, single episode, unspecified: Secondary | ICD-10-CM | POA: Diagnosis not present

## 2019-07-22 DIAGNOSIS — M316 Other giant cell arteritis: Secondary | ICD-10-CM | POA: Diagnosis not present

## 2019-07-22 DIAGNOSIS — R601 Generalized edema: Secondary | ICD-10-CM | POA: Diagnosis not present

## 2019-07-22 DIAGNOSIS — R0602 Shortness of breath: Secondary | ICD-10-CM | POA: Diagnosis not present

## 2019-07-30 DIAGNOSIS — E119 Type 2 diabetes mellitus without complications: Secondary | ICD-10-CM | POA: Diagnosis not present

## 2019-08-06 DIAGNOSIS — F330 Major depressive disorder, recurrent, mild: Secondary | ICD-10-CM | POA: Diagnosis not present

## 2019-08-11 ENCOUNTER — Encounter: Payer: PPO | Admitting: Family Medicine

## 2019-08-18 DIAGNOSIS — M545 Low back pain: Secondary | ICD-10-CM | POA: Diagnosis not present

## 2019-08-20 ENCOUNTER — Other Ambulatory Visit: Payer: Self-pay

## 2019-08-20 ENCOUNTER — Ambulatory Visit: Payer: PPO

## 2019-08-20 DIAGNOSIS — Z0000 Encounter for general adult medical examination without abnormal findings: Secondary | ICD-10-CM

## 2019-08-20 NOTE — Progress Notes (Signed)
Subjective:   Edward King is a 68 y.o. male who presents for Medicare Annual/Subsequent preventive examination.    This visit is being conducted through telemedicine due to the COVID-19 pandemic. This patient has given me verbal consent via doximity to conduct this visit, patient states they are participating from their home address. Some vital signs may be absent or patient reported.    Patient identification: identified by name, DOB, and current address  Review of Systems:  N/A  Cardiac Risk Factors include: male gender;advanced age (>36mn, >>33women);obesity (BMI >30kg/m2);diabetes mellitus;hypertension;dyslipidemia     Objective:    Vitals: There were no vitals taken for this visit.  There is no height or weight on file to calculate BMI. Unable to obtain vitals due to visit being conducted via telephonically.   Advanced Directives 08/20/2019 07/31/2018 07/31/2018 05/22/2018 02/13/2018 05/09/2017 11/27/2016  Does Patient Have a Medical Advance Directive? No No No No No No No  Would patient like information on creating a medical advance directive? - No - Patient declined No - Patient declined Yes (MAU/Ambulatory/Procedural Areas - Information given) No - Patient declined Yes (ED - Information included in AVS) No - Patient declined    Tobacco Social History   Tobacco Use  Smoking Status Never Smoker  Smokeless Tobacco Never Used     Counseling given: Not Answered   Clinical Intake:  Pre-visit preparation completed: Yes  Pain : 0-10 Pain Score: 8  Pain Type: Acute pain Pain Location: Back Pain Orientation: Lower Pain Descriptors / Indicators: Aching Pain Onset: 1 to 4 weeks ago Pain Frequency: Intermittent     Nutritional Status: BMI > 30  Obese Nutritional Risks: None Diabetes: Yes  How often do you need to have someone help you when you read instructions, pamphlets, or other written materials from your doctor or pharmacy?: 1 - Never   Diabetes:  Is the  patient diabetic?  Yes type 2 If diabetic, was a CBG obtained today?  No  Did the patient bring in their glucometer from home?  No  How often do you monitor your CBG's? A couple times weekly.   Financial Strains and Diabetes Management:  Are you having any financial strains with the device, your supplies or your medication? No .  Does the patient want to be seen by Chronic Care Management for management of their diabetes?  No  Would the patient like to be referred to a Nutritionist or for Diabetic Management?  No   Diabetic Exams:  Diabetic Eye Exam: Completed 01/29/19. Repeat yearly.   Diabetic Foot Exam: Currently due. Pt has been advised about the importance in completing this exam. Note made to follow up on this at next in office visit.    Interpreter Needed?: No  [facility]  Past Medical History:  Diagnosis Date  . Arthritis   . Cat bite 07/31/2018  . Cellulitis of right leg without foot   . Colitis   . Colitis   . Depression   . Diabetes mellitus without complication ([place]   . Giant cell arteritis ([place] 06/15/2015  . History of chicken pox   . History of measles   . History of mumps   . Hypertension   . Lymph edema    both legs.  mostly left  . [place]romuscular disorder ([place]   . Prostate CA ([facility] 2016   Radiation seed implants.  . [place]roke ([place]   . Temporal arteritis ([facility])    Clinically dx Dr. KJefm Bryant  Negative temporal artrtery bx   Past Surgical History:  Procedure Laterality Date  . ABDOMINOPLASTY    . Carotid Doppler Ultrasound  08/22/2014   [facility]; Minimal plaque right. Minimal thisckening left Anterograde flow vertebrals  . COLONOSCOPY WITH PROPOFOL N/A 02/13/2018   Procedure: COLONOSCOPY WITH PROPOFOL;  Surgeon: Manya Silvas, MD;  Location: [facility] ENDOSCOPY;  Service: Endoscopy;  Laterality: N/A;  . CT of the head  08/22/2014   [facility]. Normal  . DOPPLER ECHOCARDIOGRAPHY  08/29/2014   EF=60-65%. Normal LVEF  . Myocardial  Perfusion Scan  09/10/2012   Poor exercsie tolerance, but no evidence of stress induced myocardial ischemia. EF=66%. Dr. Clayborn Bigness  . NASAL SEPTOPLASTY W/ TURBINOPLASTY Bilateral 02/10/2016   Procedure: NASAL SEPTOPLASTY WITH INFERIOR TURBINATE REDUCTION;  Surgeon: Carloyn Manner, MD;  Location: [facility] ORS;  Service: ENT;  Laterality: Bilateral;  . pannectomy    . TEMPORAL ARTERY BIOPSY / LIGATION Left    Family History  Problem Relation Age of Onset  . Colon cancer Mother   . Coronary artery disease Mother   . Prostate cancer Father   . Kidney disease Father   . Dementia Brother   . Diabetes Neg Hx    Social History   Socioeconomic History  . Marital status: Married    Spouse name: Not on file  . Number of children: 1  . Years of education: Not on file  . Highest education level: Some college, no degree  Occupational History  . 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 Financial resource strain: Not hard at all  . Food insecurity    Worry: Never true    Inability: Never true  . Transportation needs    Medical: No    Non-medical: No  Tobacco Use  . Smoking status: Never Smoker  . Smokeless tobacco: Never Used  Substance and Sexual Activity  . Alcohol use: Yes    Alcohol/week: 0.0 standard drinks    Comment: Rare - 1 drink  . Drug use: No  . Sexual activity: Not on file  Lifestyle  . Physical activity    Days per week: 0 days    Minutes per session: 0 min  . Stress: To some extent  Relationships  . Social Herbalist on phone: Patient refused    Gets together: Patient refused    Attends religious service: Patient refused    Active member of club or organization: Patient refused    Attends meetings of clubs or organizations: Patient refused    Relationship status: Patient refused  Other Topics Concern  . Not on file  Social History Narrative  . Not on file    Outpatient Encounter  Medications as of 08/20/2019  Medication Sig  . buPROPion (WELLBUTRIN SR) 150 MG 12 hr tablet Take 150 mg by mouth daily.  . citalopram (CELEXA) 40 MG tablet Take 40 mg by mouth daily.   . Coenzyme Q10 (COQ-10) 100 MG CAPS Take 100 mg by mouth daily.  Marland Kitchen gabapentin (NEURONTIN) 300 MG capsule TAKE 2 CAPSULES BY MOUTH IN THE MORNING AND 3 CAPSULES AT BEDTIME  . glipiZIDE (GLUCOTROL XL) 2.5 MG 24 hr tablet TAKE 1 TABLET BY MOUTH EVERY DAY  . glucose blood (CONTOUR NEXT TEST) test strip Use as instructed to check blood sugar daily  . mesalamine (APRISO) 0.375 g 24 hr capsule Take 750 mg by mouth 2 (two) times daily.  Marland Kitchen  metFORMIN (GLUCOPHAGE-XR) 500 MG 24 hr tablet TAKE 2 TABLETS BY MOUTH TWICE A DAY  . pravastatin (PRAVACHOL) 40 MG tablet Take 1 tablet (40 mg total) by mouth daily with lunch.  . predniSONE (DELTASONE) 5 MG tablet Take 5 mg by mouth daily with breakfast.   . TRULICITY 1.5 SH/7.69YO SOPN INJECT 1.5 MG INTO THE SKIN ONCE A WEEK.  . valsartan-hydrochlorothiazide (DIOVAN-HCT) 80-12.5 MG tablet Take 1 tablet by mouth daily.  . Vitamin D, Cholecalciferol, 1000 units TABS Take 1,000 Units by mouth daily.   No facility-administered encounter medications on file as of 08/20/2019.     Activities of Daily Living In your present state of health, do you have any difficulty performing the following activities: 08/20/2019 01/01/2019  Hearing? Y -  Comment Wears bilateral hearing aids. -  Vision? N Y  Difficulty concentrating or making decisions? N N  Walking or climbing stairs? N Y  Dressing or bathing? N N  Doing errands, shopping? N N  Preparing Food and eating ? N N  Using the Toilet? N N  In the past six months, have you accidently leaked urine? N N  Do you have problems with loss of bowel control? N N  Managing your Medications? N N  Managing your Finances? N N  Housekeeping or managing your Housekeeping? N Y  Some recent data might be hidden    Patient Care Team: Birdie Sons, MD  as PCP - General (Family Medicine) Yolonda Kida, MD as Consulting Physician (Cardiology) Manya Silvas, MD (Gastroenterology) Emmaline Kluver., MD as Consulting Physician (Rheumatology) Royston Cowper, MD as Consulting Physician (Urology) Thurmond Butts Orson Ape, MD as Attending Physician (Psychiatry) Eulogio Bear, MD as Consulting Physician (Ophthalmology)   Assessment:   This is a routine wellness examination for Edward King.  Exercise Activities and Dietary recommendations Current Exercise Habits: The patient does not participate in regular exercise at present, Exercise limited by: orthopedic condition(s)  Goals    . Exercise 3x per week (30 min per time)     Recommend increasing exercise. Pt to start walking 3 days a week for 30 minutes.       Fall Risk Fall Risk  08/20/2019 04/01/2019 01/01/2019 08/28/2018 05/22/2018  Falls in the past year? 1 0 0 No No  Number falls in past yr: 0 - - - -  Injury with Fall? 0 - - - -  Risk for fall due to : - - - - -  Risk for fall due to: Comment - - - - -  Follow up Falls prevention discussed - - - -   FALL RISK PREVENTION PERTAINING TO THE HOME:  Any stairs in or around the home? Yes  If so, are there any without handrails? Yes however rails are being put up today.   Home free of loose throw rugs in walkways, pet beds, electrical cords, etc? Yes  Adequate lighting in your home to reduce risk of falls? Yes   ASSISTIVE DEVICES UTILIZED TO PREVENT FALLS:  Life alert? No  Use of a cane, walker or w/c? Yes  Grab bars in the bathroom? Yes  Shower chair or bench in shower? No  Elevated toilet seat or a handicapped toilet? No    TIMED UP AND GO:  Was the test performed? No .    Depression Screen PHQ 2/9 Scores 08/20/2019 04/01/2019 01/01/2019 08/28/2018  PHQ - 2 Score 1 1 1 2   PHQ- 9 Score - - 4 6  Cognitive Function     6CIT Screen 08/20/2019 05/22/2018 05/09/2017  What Year? 0 points 0 points 0 points  What month? 0 points 0  points 0 points  What time? 0 points 0 points 0 points  Count back from 20 2 points 0 points 0 points  Months in reverse 0 points 4 points 2 points  Repeat phrase 2 points 8 points 4 points  Total Score 4 12 6     Immunization History  Administered Date(s) Administered  . Influenza Whole 10/18/2012  . Influenza, High Dose Seasonal PF 10/13/2017, 10/22/2018  . Influenza,inj,Quad PF,6+ Mos 08/30/2015  . Influenza,inj,quad, With Preservative 09/17/2018  . Pneumococcal Conjugate-13 11/17/2015  . Pneumococcal Polysaccharide-23 02/01/2006, 05/11/2017  . Tdap 05/11/2017, 08/02/2018  . Zoster 04/29/2012    Qualifies for Shingles Vaccine? Yes  Zostavax completed 04/29/12. Due for Shingrix. Education has been provided regarding the importance of this vaccine. Pt has been advised to call insurance company to determine out of pocket expense. Advised may also receive vaccine at local pharmacy or Health Dept. Verbalized acceptance and understanding.  Tdap: Up to date  Flu Vaccine: Due for Flu vaccine. Does the patient want to receive this vaccine today?  No .   Pneumococcal Vaccine: Up to date  Screening Tests Health Maintenance  Topic Date Due  . FOOT EXAM  09/16/1961  . INFLUENZA VACCINE  07/19/2019  . HEMOGLOBIN A1C  07/25/2019  . OPHTHALMOLOGY EXAM  01/30/2020  . COLONOSCOPY  02/13/2021  . TETANUS/TDAP  08/02/2028  . Hepatitis C Screening  Completed  . PNA vac Low Risk Adult  Completed   Cancer Screenings:  Colorectal Screening: Completed 02/13/18. Repeat every 3 years.  Lung Cancer Screening: (Low Dose CT Chest recommended if Age 4-80 years, 30 pack-year currently smoking OR have quit w/in 15years.) does not qualify.   Additional Screening:  Hepatitis C Screening: Up to date  Dental Screening: Recommended annual dental exams for proper oral hygiene  Community Resource Referral:  CRR required this visit?  No        Plan:  I have personally reviewed and addressed the  Medicare Annual Wellness questionnaire and have noted the following in the patient's chart:  A. Medical and social history B. Use of alcohol, tobacco or illicit drugs  C. Current medications and supplements D. Functional ability and status E.  Nutritional status F.  Physical activity G. Advance directives H. List of other physicians I.  Hospitalizations, surgeries, and ER visits in previous 12 months J.  [place] such as hearing and vision if needed, cognitive and depression L. Referrals and appointments   In addition, I have reviewed and discussed with patient certain preventive protocols, quality metrics, and best practice recommendations. A written personalized care plan for preventive services as well as general preventive health recommendations were provided to patient.   Glendora Score, [place]  12/23/1094 Nurse Health Advisor  Nurse Notes: Pt needs a diabetic foot exam, Hgb A1c checked and influenza vaccine at next in office visit.

## 2019-08-20 NOTE — Patient Instructions (Signed)
Edward King , Thank you for taking time to come for your Medicare Wellness Visit. I appreciate your ongoing commitment to your health goals. Please review the following plan we discussed and let me know if I can assist you in the future.   Screening recommendations/referrals: Colonoscopy: Up to date, due 01/2021 Recommended yearly ophthalmology/optometry visit for glaucoma screening and checkup Recommended yearly dental visit for hygiene and checkup  Vaccinations: Influenza vaccine: Currently due Pneumococcal vaccine: Completed series Tdap vaccine: Up to date, due 07/2028 Shingles vaccine: Pt declines today.     Advanced directives: Advance directive discussed with you today. Advised to print forms off the Internet or pick up from the office or hospital.   Conditions/risks identified: Obesity- Continue to work towards exercising (walking) 3 days a week for 20-30 minutes at a time.   Next appointment: 08/29/19 with Dr Caryn Section.   Preventive Care 28 Years and Older, Male Preventive care refers to lifestyle choices and visits with your health care provider that can promote health and wellness. What does preventive care include?  A yearly physical exam. This is also called an annual well check.  Dental exams once or twice a year.  Routine eye exams. Ask your health care provider how often you should have your eyes checked.  Personal lifestyle choices, including:  Daily care of your teeth and gums.  Regular physical activity.  Eating a healthy diet.  Avoiding tobacco and drug use.  Limiting alcohol use.  Practicing safe sex.  Taking low doses of aspirin every day.  Taking vitamin and mineral supplements as recommended by your health care provider. What happens during an annual well check? The services and screenings done by your health care provider during your annual well check will depend on your age, overall health, lifestyle risk factors, and family history of disease.  Counseling  Your health care provider may ask you questions about your:  Alcohol use.  Tobacco use.  Drug use.  Emotional well-being.  Home and relationship well-being.  Sexual activity.  Eating habits.  History of falls.  Memory and ability to understand (cognition).  Work and work Statistician. Screening  You may have the following tests or measurements:  Height, weight, and BMI.  Blood pressure.  Lipid and cholesterol levels. These may be checked every 5 years, or more frequently if you are over 54 years old.  Skin check.  Lung cancer screening. You may have this screening every year starting at age 37 if you have a 30-pack-year history of smoking and currently smoke or have quit within the past 15 years.  Fecal occult blood test (FOBT) of the stool. You may have this test every year starting at age 68.  Flexible sigmoidoscopy or colonoscopy. You may have a sigmoidoscopy every 5 years or a colonoscopy every 10 years starting at age 72.  Prostate cancer screening. Recommendations will vary depending on your family history and other risks.  Hepatitis C blood test.  Hepatitis B blood test.  Sexually transmitted disease (STD) testing.  Diabetes screening. This is done by checking your blood sugar (glucose) after you have not eaten for a while (fasting). You may have this done every 1-3 years.  Abdominal aortic aneurysm (AAA) screening. You may need this if you are a current or former smoker.  Osteoporosis. You may be screened starting at age 33 if you are at high risk. Talk with your health care provider about your test results, treatment options, and if necessary, the need for more tests. Vaccines  Your health care provider may recommend certain vaccines, such as:  Influenza vaccine. This is recommended every year.  Tetanus, diphtheria, and acellular pertussis (Tdap, Td) vaccine. You may need a Td booster every 10 years.  Zoster vaccine. You may need this  after age 65.  Pneumococcal 13-valent conjugate (PCV13) vaccine. One dose is recommended after age 42.  Pneumococcal polysaccharide (PPSV23) vaccine. One dose is recommended after age 93. Talk to your health care provider about which screenings and vaccines you need and how often you need them. This information is not intended to replace advice given to you by your health care provider. Make sure you discuss any questions you have with your health care provider. Document Released: 12/31/2015 Document Revised: 08/23/2016 Document Reviewed: 10/05/2015 Elsevier Interactive Patient Education  2017 [place] Prevention in the Home Falls can cause injuries. They can happen to people of all ages. There are many things you can do to make your home safe and to help prevent falls. What can I do on the outside of my home?  Regularly fix the edges of walkways and driveways and fix any cracks.  Remove anything that might make you trip as you walk through a door, such as a raised step or threshold.  Trim any bushes or trees on the path to your home.  Use bright outdoor lighting.  Clear any walking paths of anything that might make someone trip, such as rocks or tools.  Regularly check to see if handrails are loose or broken. Make sure that both sides of any steps have handrails.  Any raised decks and porches should have guardrails on the edges.  Have any leaves, snow, or ice cleared regularly.  Use sand or salt on walking paths during winter.  Clean up any spills in your garage right away. This includes oil or grease spills. What can I do in the bathroom?  Use night lights.  Install grab bars by the toilet and in the tub and shower. Do not use towel bars as grab bars.  Use non-skid mats or decals in the tub or shower.  If you need to sit down in the shower, use a plastic, non-slip stool.  Keep the floor dry. Clean up any water that spills on the floor as soon as it happens.   Remove soap buildup in the tub or shower regularly.  Attach bath mats securely with double-sided non-slip rug tape.  Do not have throw rugs and other things on the floor that can make you trip. What can I do in the bedroom?  Use night lights.  Make sure that you have a light by your bed that is easy to reach.  Do not use any sheets or blankets that are too big for your bed. They should not hang down onto the floor.  Have a firm chair that has side arms. You can use this for support while you get dressed.  Do not have throw rugs and other things on the floor that can make you trip. What can I do in the kitchen?  Clean up any spills right away.  Avoid walking on wet floors.  Keep items that you use a lot in easy-to-reach places.  If you need to reach something above you, use a strong step stool that has a grab bar.  Keep electrical cords out of the way.  Do not use floor polish or wax that makes floors slippery. If you must use wax, use non-skid floor wax.  Do  not have throw rugs and other things on the floor that can make you trip. What can I do with my stairs?  Do not leave any items on the stairs.  Make sure that there are handrails on both sides of the stairs and use them. Fix handrails that are broken or loose. Make sure that handrails are as long as the stairways.  Check any carpeting to make sure that it is firmly attached to the stairs. Fix any carpet that is loose or worn.  Avoid having throw rugs at the top or bottom of the stairs. If you do have throw rugs, attach them to the floor with carpet tape.  Make sure that you have a light switch at the top of the stairs and the bottom of the stairs. If you do not have them, ask someone to add them for you. What else can I do to help prevent falls?  Wear shoes that:  Do not have high heels.  Have rubber bottoms.  Are comfortable and fit you well.  Are closed at the toe. Do not wear sandals.  If you use a  stepladder:  Make sure that it is fully opened. Do not climb a closed stepladder.  Make sure that both sides of the stepladder are locked into place.  Ask someone to hold it for you, if possible.  Clearly mark and make sure that you can see:  Any grab bars or handrails.  First and last steps.  Where the edge of each step is.  Use tools that help you move around (mobility aids) if they are needed. These include:  Canes.  Walkers.  Scooters.  Crutches.  Turn on the lights when you go into a dark area. Replace any light bulbs as soon as they burn out.  Set up your furniture so you have a clear path. Avoid moving your furniture around.  If any of your floors are uneven, fix them.  If there are any pets around you, be aware of where they are.  Review your medicines with your doctor. Some medicines can make you feel dizzy. This can increase your chance of falling. Ask your doctor what other things that you can do to help prevent falls. This information is not intended to replace advice given to you by your health care provider. Make sure you discuss any questions you have with your health care provider. Document Released: 09/30/2009 Document Revised: 05/11/2016 Document Reviewed: 01/08/2015 Elsevier Interactive Patient Education  2017 [employer].

## 2019-08-28 DIAGNOSIS — M545 Low back pain: Secondary | ICD-10-CM | POA: Diagnosis not present

## 2019-08-29 ENCOUNTER — Ambulatory Visit: Payer: PPO | Admitting: Family Medicine

## 2019-08-29 NOTE — Progress Notes (Deleted)
Patient: Edward King Male    DOB: [birth year]   68 y.o.   MRN: [id number] Visit Date: 08/29/2019  Today's Provider: Lelon Huh, MD   No chief complaint on file.  Subjective:     HPI  Diabetes Mellitus Type II, Follow-up:   Lab Results  Component Value Date   HGBA1C 7.3 (H) 01/24/2019   HGBA1C 7.7 (A) 10/22/2018   HGBA1C 8.0 (H) 05/22/2018    Last seen for diabetes 7 months ago.  Management since then includes increased Trulicity to 2.2QM weekly. He reports {excellent/good/fair/poor:19665} compliance with treatment. He {ACTION; IS/IS GNO:[id number]} having side effects. *** Current symptoms include {Symptoms; diabetes:14075} and have been {Desc; course:15616}. Home blood sugar records: {diabetes glucometry results:16657}  Episodes of hypoglycemia? {yes***/no:17258}   {Current insulin regiment:22600::"***":1} Most Recent Eye Exam: *** Weight trend: {trend:16658} {Prior visit with dietician:20300:::1} {Current exercise:16438:::1} {Current diet habits:16563:::1}  Pertinent Labs:    Component Value Date/Time   CHOL 148 01/24/2019 1420   CHOL 174 08/30/2014 0450   TRIG 107 01/24/2019 1420   TRIG 73 08/30/2014 0450   HDL 58 01/24/2019 1420   HDL 40 08/30/2014 0450   LDLCALC 69 01/24/2019 1420   LDLCALC 119 (H) 08/30/2014 0450   CREATININE 1.02 01/24/2019 1420   CREATININE 1.10 08/30/2014 0450    Wt Readings from Last 3 Encounters:  01/24/19 (!) 314 lb (142.4 kg)  11/25/18 (!) 311 lb (141.1 kg)  10/22/18 (!) 324 lb (147 kg)    ------------------------------------------------------------------------  Hypertension, follow-up:  BP Readings from Last 3 Encounters:  01/24/19 136/84  11/25/18 110/80  10/22/18 124/66    He was last seen for hypertension 7 months ago.  BP at that visit was 136/84. Management changes since that visit include no changes. He reports {excellent/good/fair/poor:19665} compliance with treatment. He {ACTION; IS/IS  BVQ:[id number]} having side effects. *** He {is/is not:9024} exercising. He {is/is not:9024} adherent to low salt diet.   Outside blood pressures are ***. He is experiencing {Symptoms; cardiac:12860}.  Patient denies {Symptoms; cardiac:12860}.   Cardiovascular risk factors include {cv risk factors:510}.  Use of agents associated with hypertension: {bp agents assoc with hypertension:511::"none"}.     Weight trend: {trend:16658} Wt Readings from Last 3 Encounters:  01/24/19 (!) 314 lb (142.4 kg)  11/25/18 (!) 311 lb (141.1 kg)  10/22/18 (!) 324 lb (147 kg)    Current diet: {diet habits:16563}  ------------------------------------------------------------------------  No Known Allergies   Current Outpatient Medications:  .  buPROPion (WELLBUTRIN SR) 150 MG 12 hr tablet, Take 150 mg by mouth daily., Disp: , Rfl:  .  citalopram (CELEXA) 40 MG tablet, Take 40 mg by mouth daily. , Disp: , Rfl:  .  Coenzyme Q10 (COQ-10) 100 MG CAPS, Take 100 mg by mouth daily., Disp: , Rfl:  .  gabapentin (NEURONTIN) 300 MG capsule, TAKE 2 CAPSULES BY MOUTH IN THE MORNING AND 3 CAPSULES AT BEDTIME, Disp: 450 capsule, Rfl: 12 .  glipiZIDE (GLUCOTROL XL) 2.5 MG 24 hr tablet, TAKE 1 TABLET BY MOUTH EVERY DAY, Disp: 90 tablet, Rfl: 4 .  glucose blood (CONTOUR NEXT TEST) test strip, Use as instructed to check blood sugar daily, Disp: 100 each, Rfl: 4 .  mesalamine (APRISO) 0.375 g 24 hr capsule, Take 750 mg by mouth 2 (two) times daily., Disp: , Rfl:  .  metFORMIN (GLUCOPHAGE-XR) 500 MG 24 hr tablet, TAKE 2 TABLETS BY MOUTH TWICE A DAY, Disp: 360 tablet, Rfl: 4 .  pravastatin (PRAVACHOL) 40 MG tablet,  Take 1 tablet (40 mg total) by mouth daily with lunch., Disp: 90 tablet, Rfl: 4 .  predniSONE (DELTASONE) 5 MG tablet, Take 5 mg by mouth daily with breakfast. , Disp: , Rfl:  .  TRULICITY 1.5 VG/8.6OY SOPN, INJECT 1.5 MG INTO THE SKIN ONCE A WEEK., Disp: 4 pen, Rfl: 12 .  valsartan-hydrochlorothiazide (DIOVAN-HCT)  80-12.5 MG tablet, Take 1 tablet by mouth daily., Disp: 90 tablet, Rfl: 4 .  Vitamin D, Cholecalciferol, 1000 units TABS, Take 1,000 Units by mouth daily., Disp: , Rfl:   Review of Systems  Social History   Tobacco Use  . Smoking status: Never Smoker  . Smokeless tobacco: Never Used  Substance Use Topics  . Alcohol use: Yes    Alcohol/week: 0.0 standard drinks    Comment: Rare - 1 drink      Objective:   There were no vitals taken for this visit. There were no vitals filed for this visit.There is no height or weight on file to calculate BMI.   Physical Exam   No results found for any visits on 08/29/19.     Assessment & Plan        Lelon Huh, MD  [facility] Clint Bolder as a scribe for Lelon Huh, MD.,have documented all relevant documentation on the behalf of Lelon Huh, MD,as directed by  Lelon Huh, MD while in the presence of Lelon Huh, MD.

## 2019-09-03 DIAGNOSIS — M5136 Other intervertebral disc degeneration, lumbar region: Secondary | ICD-10-CM | POA: Diagnosis not present

## 2019-09-03 DIAGNOSIS — G894 Chronic pain syndrome: Secondary | ICD-10-CM | POA: Diagnosis not present

## 2019-09-03 DIAGNOSIS — Z5181 Encounter for therapeutic drug level monitoring: Secondary | ICD-10-CM | POA: Diagnosis not present

## 2019-09-03 DIAGNOSIS — M47896 Other spondylosis, lumbar region: Secondary | ICD-10-CM | POA: Diagnosis not present

## 2019-09-03 DIAGNOSIS — Z79899 Other long term (current) drug therapy: Secondary | ICD-10-CM | POA: Diagnosis not present

## 2019-09-05 ENCOUNTER — Other Ambulatory Visit: Payer: Self-pay

## 2019-09-05 ENCOUNTER — Ambulatory Visit (INDEPENDENT_AMBULATORY_CARE_PROVIDER_SITE_OTHER): Payer: PPO | Admitting: Family Medicine

## 2019-09-05 ENCOUNTER — Encounter: Payer: Self-pay | Admitting: Family Medicine

## 2019-09-05 VITALS — BP 125/82 | HR 74 | Temp 96.8°F | Wt 320.4 lb

## 2019-09-05 DIAGNOSIS — R238 Other skin changes: Secondary | ICD-10-CM

## 2019-09-05 DIAGNOSIS — E1149 Type 2 diabetes mellitus with other diabetic neurological complication: Secondary | ICD-10-CM | POA: Diagnosis not present

## 2019-09-05 DIAGNOSIS — Z8546 Personal history of malignant neoplasm of prostate: Secondary | ICD-10-CM | POA: Diagnosis not present

## 2019-09-05 DIAGNOSIS — R233 Spontaneous ecchymoses: Secondary | ICD-10-CM

## 2019-09-05 DIAGNOSIS — Z23 Encounter for immunization: Secondary | ICD-10-CM

## 2019-09-05 LAB — POCT GLYCOSYLATED HEMOGLOBIN (HGB A1C): Hemoglobin A1C: 6.5 % — AB (ref 4.0–5.6)

## 2019-09-05 NOTE — Progress Notes (Signed)
Patient: Edward King Male    DOB: 02-06-1951   68 y.o.   MRN: [id number] Visit Date: 09/05/2019  Today's Provider: Lelon Huh, MD   No chief complaint on file.  Subjective:     HPI  Diabetes Mellitus Type II, Follow-up:   Lab Results  Component Value Date   HGBA1C 7.3 (H) 01/24/2019   HGBA1C 7.7 (A) 10/22/2018   HGBA1C 8.0 (H) 05/22/2018    Last seen for diabetes 7 months ago.  Management since then includes increased Trulicity to 5.6LS. He reports excellent compliance with treatment. He is not having side effects.  Current symptoms include visual disturbances and have been stable. Home blood sugar records: fasting range: 120-125  Episodes of hypoglycemia? no   Current insulin regiment: Is not on insulin Most Recent Eye Exam: 01/29/2019 Weight trend: increasing steadily Prior visit with dietician: No Current exercise: none Current diet habits: in general, an "unhealthy" diet  Pertinent Labs:   Wt Readings from Last 3 Encounters:  01/24/19 (!) 314 lb (142.4 kg)  11/25/18 (!) 311 lb (141.1 kg)  10/22/18 (!) 324 lb (147 kg)    ------------------------------------------------------------------------  Hypertension, follow-up:  BP Readings from Last 3 Encounters:  01/24/19 136/84  11/25/18 110/80  10/22/18 124/66    He was last seen for hypertension 7 months ago.  BP at that visit was 136/84. Management changes since that visit include none. He reports excellent compliance with treatment. He is not having side effects.  He is not exercising. He is adherent to low salt diet.   Outside blood pressures are not regularly checked. Marland Kitchen He is experiencing lower extremity edema.  Patient denies chest pain, chest pressure/discomfort, claudication, dyspnea, exertional chest pressure/discomfort, fatigue, irregular heart beat, near-syncope, orthopnea, palpitations, paroxysmal nocturnal dyspnea, syncope and tachypnea.   Cardiovascular risk factors include  advanced age (older than 41 for men, 88 for women), diabetes mellitus, hypertension, male gender and obesity (BMI >= 30 kg/m2).  Use of agents associated with hypertension: none.     Weight trend: increasing steadily Wt Readings from Last 3 Encounters:  01/24/19 (!) 314 lb (142.4 kg)  11/25/18 (!) 311 lb (141.1 kg)  10/22/18 (!) 324 lb (147 kg)    Current diet: in general, an "unhealthy" diet  ------------------------------------------------------------------------  No Known Allergies   Current Outpatient Medications:  .  buPROPion (WELLBUTRIN SR) 150 MG 12 hr tablet, Take 150 mg by mouth daily., Disp: , Rfl:  .  citalopram (CELEXA) 40 MG tablet, Take 40 mg by mouth daily. , Disp: , Rfl:  .  Coenzyme Q10 (COQ-10) 100 MG CAPS, Take 100 mg by mouth daily., Disp: , Rfl:  .  gabapentin (NEURONTIN) 300 MG capsule, TAKE 2 CAPSULES BY MOUTH IN THE MORNING AND 3 CAPSULES AT BEDTIME, Disp: 450 capsule, Rfl: 12 .  glipiZIDE (GLUCOTROL XL) 2.5 MG 24 hr tablet, TAKE 1 TABLET BY MOUTH EVERY DAY, Disp: 90 tablet, Rfl: 4 .  glucose blood (CONTOUR NEXT TEST) test strip, Use as instructed to check blood sugar daily, Disp: 100 each, Rfl: 4 .  mesalamine (APRISO) 0.375 g 24 hr capsule, Take 750 mg by mouth 2 (two) times daily., Disp: , Rfl:  .  metFORMIN (GLUCOPHAGE-XR) 500 MG 24 hr tablet, TAKE 2 TABLETS BY MOUTH TWICE A DAY, Disp: 360 tablet, Rfl: 4 .  pravastatin (PRAVACHOL) 40 MG tablet, Take 1 tablet (40 mg total) by mouth daily with lunch., Disp: 90 tablet, Rfl: 4 .  predniSONE (DELTASONE)  5 MG tablet, Take 5 mg by mouth daily with breakfast. , Disp: , Rfl:  .  TRULICITY 1.5 CB/7.6EG SOPN, INJECT 1.5 MG INTO THE SKIN ONCE A WEEK., Disp: 4 pen, Rfl: 12 .  valsartan-hydrochlorothiazide (DIOVAN-HCT) 80-12.5 MG tablet, Take 1 tablet by mouth daily., Disp: 90 tablet, Rfl: 4 .  Vitamin D, Cholecalciferol, 1000 units TABS, Take 1,000 Units by mouth daily., Disp: , Rfl:   Review of Systems   Hematological: Bruises/bleeds easily.    Social History   Tobacco Use  . Smoking status: Never Smoker  . Smokeless tobacco: Never Used  Substance Use Topics  . Alcohol use: Yes    Alcohol/week: 0.0 standard drinks    Comment: Rare - 1 drink      Objective:    Vitals:   09/05/19 1623  BP: 125/82  Pulse: 74  Temp: (!) 96.8 F (36 C)  TempSrc: Oral  Weight: (!) 320 lb 6.4 oz (145.3 kg)  Body mass index is 44.69 kg/m.   Physical Exam  General Appearance:    Alert, cooperative, no distress, obese  Eyes:    PERRL, conjunctiva/corneas clear, EOM's intact       Lungs:     Clear to auscultation bilaterally, respirations unlabored  Heart:    Normal heart rate. Normal rhythm. No murmurs, rubs, or gallops.   MS:   All extremities are intact.   Skin:   Scattered echomycosis across both upper extremities.        Results for orders placed or performed in visit on 09/05/19  POCT glycosylated hemoglobin (Hb A1C)  Result Value Ref Range   Hemoglobin A1C 6.5 (A) 4.0 - 5.6 %   HbA1c POC (<> result, manual entry)     HbA1c, POC (prediabetic range)     HbA1c, POC (controlled diabetic range)         Assessment & Plan    1. Type 2 diabetes mellitus with other neurologic complication, without long-term current use of insulin (HCC) Well controlled.  Continue current medications.   - Comprehensive metabolic panel - Lipid panel  2. Morbid obesity ([place]) Counseled regarding prudent diet and regular exercise.    3. History of prostate cancer Continue regular follow up Dr. Eliberto Ivory   4. Need for influenza vaccination  - Flu Vaccine QUAD High Dose(Fluad)  5. Abnormal bruising  - PT and PTT - CBC  The entirety of the information documented in the History of Present Illness, Review of Systems and Physical Exam were personally obtained by me. Portions of this information were initially documented by Minette Headland, CMA and reviewed by me for thoroughness and accuracy.       Lelon Huh, MD  [facility]

## 2019-09-05 NOTE — Patient Instructions (Addendum)
.   Please review the attached list of medications and notify my office if there are any errors.    Please bring all of your medications to every appointment so we can make sure that our medication list is the same as yours.   . Please go to the lab draw station in [address] on the second floor of [facility]  when you are fasting for 8 hours. Normal hours are 8:00am to 12:30pm and 1:30pm to 4:00pm Monday through Friday

## 2019-09-09 DIAGNOSIS — E1149 Type 2 diabetes mellitus with other diabetic neurological complication: Secondary | ICD-10-CM | POA: Diagnosis not present

## 2019-09-09 DIAGNOSIS — R238 Other skin changes: Secondary | ICD-10-CM | POA: Diagnosis not present

## 2019-09-10 LAB — CBC
Hematocrit: 40.9 % (ref 37.5–51.0)
Hemoglobin: 13.6 g/dL (ref 13.0–17.7)
MCH: 27.1 pg (ref 26.6–33.0)
MCHC: 33.3 g/dL (ref 31.5–35.7)
MCV: 82 fL (ref 79–97)
Platelets: 253 10*3/uL (ref 150–450)
RBC: 5.02 x10E6/uL (ref 4.14–5.80)
RDW: 14 % (ref 11.6–15.4)
WBC: 9.9 10*3/uL (ref 3.4–10.8)

## 2019-09-10 LAB — LIPID PANEL
Chol/HDL Ratio: 2.2 ratio (ref 0.0–5.0)
Cholesterol, Total: 138 mg/dL (ref 100–199)
HDL: 63 mg/dL (ref >39)
LDL Chol Calc (NIH): 56 mg/dL (ref 0–99)
Triglycerides: 104 mg/dL (ref 0–149)
VLDL Cholesterol Cal: 19 mg/dL (ref 5–40)

## 2019-09-10 LAB — COMPREHENSIVE METABOLIC PANEL
ALT: 13 IU/L (ref 0–44)
AST: 13 IU/L (ref 0–40)
Albumin/Globulin Ratio: 1.4 (ref 1.2–2.2)
Albumin: 3.7 g/dL — ABNORMAL LOW (ref 3.8–4.8)
Alkaline Phosphatase: 86 IU/L (ref 39–117)
BUN/Creatinine Ratio: 17 (ref 10–24)
BUN: 18 mg/dL (ref 8–27)
Bilirubin Total: 0.4 mg/dL (ref 0.0–1.2)
CO2: 26 mmol/L (ref 20–29)
Calcium: 9.1 mg/dL (ref 8.6–10.2)
Chloride: 100 mmol/L (ref 96–106)
Creatinine, Ser: 1.08 mg/dL (ref 0.76–1.27)
GFR calc Af Amer: 81 mL/min/{1.73_m2} (ref >59)
GFR calc non Af Amer: 70 mL/min/{1.73_m2} (ref >59)
Globulin, Total: 2.7 g/dL (ref 1.5–4.5)
Glucose: 111 mg/dL — ABNORMAL HIGH (ref 65–99)
Potassium: 4.3 mmol/L (ref 3.5–5.2)
Sodium: 139 mmol/L (ref 134–144)
Total Protein: 6.4 g/dL (ref 6.0–8.5)

## 2019-09-10 LAB — PT AND PTT
INR: 1 (ref 0.8–1.2)
Prothrombin Time: 10.9 s (ref 9.1–12.0)
aPTT: 28 s (ref 24–33)

## 2019-09-11 ENCOUNTER — Other Ambulatory Visit: Payer: Self-pay

## 2019-09-11 MED ORDER — METFORMIN HCL ER 500 MG PO TB24: 1000.0000 mg | ORAL_TABLET | Freq: Two times a day (BID) | ORAL | 4 refills | Status: DC

## 2019-09-14 ENCOUNTER — Other Ambulatory Visit: Payer: Self-pay | Admitting: Family Medicine

## 2019-10-02 DIAGNOSIS — M316 Other giant cell arteritis: Secondary | ICD-10-CM | POA: Diagnosis not present

## 2019-10-08 ENCOUNTER — Telehealth: Payer: Self-pay | Admitting: Family Medicine

## 2019-10-08 NOTE — Chronic Care Management (AMB) (Signed)
Chronic Care Management   Note  10/08/2019 Name: BRYSAN MCEVOY MRN: [id number] DOB: Oct 31, 1951  NATASHA PAULSON is a 68 y.o. year old male who is a primary care patient of Caryn Section, Kirstie Peri, MD. I reached out to Janalee Dane by phone today in response to a referral sent by Mr. Aiken Withem Meng's health plan.     Mr. Nishi was given information about Chronic Care Management services today including:  1. CCM service includes personalized support from designated clinical staff supervised by his physician, including individualized plan of care and coordination with other care providers 2. 24/7 contact phone numbers for assistance for urgent and routine care needs. 3. Service will only be billed when office clinical staff spend 20 minutes or more in a month to coordinate care. 4. Only one practitioner may furnish and bill the service in a calendar month. 5. The patient may stop CCM services at any time (effective at the end of the month) by phone call to the office staff. 6. The patient will be responsible for cost sharing (co-pay) of up to 20% of the service fee (after annual deductible is met).  Patient agreed to services and verbal consent obtained.   Follow up plan: Telephone appointment with CCM team member scheduled for: 10/28/2019  [place]  ??[email or web address]   ??[phone]

## 2019-10-24 DIAGNOSIS — F330 Major depressive disorder, recurrent, mild: Secondary | ICD-10-CM | POA: Diagnosis not present

## 2019-10-28 ENCOUNTER — Telehealth: Payer: PPO

## 2019-11-12 DIAGNOSIS — M316 Other giant cell arteritis: Secondary | ICD-10-CM | POA: Diagnosis not present

## 2019-11-18 ENCOUNTER — Telehealth: Payer: Self-pay

## 2019-12-02 ENCOUNTER — Ambulatory Visit: Payer: Self-pay

## 2019-12-02 ENCOUNTER — Telehealth: Payer: Self-pay

## 2019-12-02 NOTE — Chronic Care Management (AMB) (Signed)
  Chronic Care Management   Outreach Note  12/02/2019 Name: HEMAN QUE MRN: [id number] DOB: 06/07/1951  Primary Care Provider : Birdie Sons, MD Reason for referral : Chronic Care Management   A unsuccessful telephone outreach was attempted today. Mr. Math was referred to the case management team for assistance with care management and care coordination.    Follow Up Plan: The care management team will reach out to Mr. Troy again within the next two to three weeks.   Horris Latino [facility] Care Management [phone]

## 2019-12-23 ENCOUNTER — Ambulatory Visit: Payer: Self-pay

## 2019-12-23 ENCOUNTER — Telehealth: Payer: Self-pay

## 2019-12-23 NOTE — Chronic Care Management (AMB) (Signed)
  Chronic Care Management   Outreach Note  12/23/2019 Name: Edward King MRN: [id number] DOB: Jul 06, 1951  Primary Care Provider: Birdie Sons, MD Reason for referral : Chronic Care Management   Third unsuccessful telephone outreach was attempted today. Edward King was referred to the care management team for assistance with chronic care management and care coordination. His primary care provider will be notified of our unsuccessful attempts to establish and maintain contact. The care management team will gladly outreach at any time in the future if he is interested in receiving assistance.   Follow Up Plan:  The care management team will gladly follow up with Edward King after the primary care provider has a conversation with him regarding recommendation for care management engagement and subsequent re-referral for care management services.   Horris Latino [facility] Care Management [phone]

## 2020-01-05 ENCOUNTER — Ambulatory Visit (INDEPENDENT_AMBULATORY_CARE_PROVIDER_SITE_OTHER): Payer: PPO | Admitting: Family Medicine

## 2020-01-05 ENCOUNTER — Other Ambulatory Visit: Payer: Self-pay

## 2020-01-05 ENCOUNTER — Encounter: Payer: Self-pay | Admitting: Family Medicine

## 2020-01-05 VITALS — BP 130/82 | HR 76 | Temp 96.6°F | Resp 18 | Ht 70.0 in | Wt 316.4 lb

## 2020-01-05 DIAGNOSIS — I10 Essential (primary) hypertension: Secondary | ICD-10-CM

## 2020-01-05 DIAGNOSIS — B351 Tinea unguium: Secondary | ICD-10-CM | POA: Diagnosis not present

## 2020-01-05 DIAGNOSIS — E1149 Type 2 diabetes mellitus with other diabetic neurological complication: Secondary | ICD-10-CM | POA: Diagnosis not present

## 2020-01-05 LAB — POCT GLYCOSYLATED HEMOGLOBIN (HGB A1C)
Est. average glucose Bld gHb Est-mCnc: 146
Hemoglobin A1C: 6.7 % — AB (ref 4.0–5.6)

## 2020-01-05 MED ORDER — TERBINAFINE HCL 250 MG PO TABS: 500.0000 mg | ORAL_TABLET | Freq: Every day | ORAL | 5 refills | Status: DC

## 2020-01-05 NOTE — Progress Notes (Signed)
Patient: Edward King Male    DOB: 09/20/1951   69 y.o.   MRN: [id number] Visit Date: 01/05/2020  Today's Provider: Lelon Huh, MD   Chief Complaint  Patient presents with  . Diabetes  . Hypertension   Subjective:     HPI  Diabetes Mellitus Type II, Follow-up:   Lab Results  Component Value Date   HGBA1C 6.7 (A) 01/05/2020   HGBA1C 6.5 (A) 09/05/2019   HGBA1C 7.3 (H) 01/24/2019    Last seen for diabetes 4 months ago.  Management since then includes continue the same medication regiment. He reports good compliance with treatment. He is not having side effects.  Current symptoms include none and have been stable. Home blood sugar records: fasting range: 100 - 140  Episodes of hypoglycemia? no   Current insulin regiment: Is not on insulin Most Recent Eye Exam: Weight trend: stable Prior visit with dietician: Yes  Current exercise: no regular exercise Current diet habits: in general, a "healthy" diet    Pertinent Labs:    Component Value Date/Time   CHOL 138 09/09/2019 1340   CHOL 174 08/30/2014 0450   TRIG 104 09/09/2019 1340   TRIG 73 08/30/2014 0450   HDL 63 09/09/2019 1340   HDL 40 08/30/2014 0450   LDLCALC 56 09/09/2019 1340   LDLCALC 119 (H) 08/30/2014 0450   CREATININE 1.08 09/09/2019 1340   CREATININE 1.10 08/30/2014 0450    Wt Readings from Last 3 Encounters:  01/05/20 (!) 316 lb 6.4 oz (143.5 kg)  09/05/19 (!) 320 lb 6.4 oz (145.3 kg)  01/24/19 (!) 314 lb (142.4 kg)    ------------------------------------------------------------------------  Follow up hypertension.  Continues on valsartan-hctz which he is tolerating well with  No adverse effects. Home blood pressures are normal.   He also complains of yellow thickened discoloration of left great toenail for a few years which continues to worsen.   No Known Allergies   Current Outpatient Medications:  .  buPROPion (WELLBUTRIN SR) 150 MG 12 hr tablet, Take 150 mg by mouth  daily., Disp: , Rfl:  .  citalopram (CELEXA) 40 MG tablet, Take 40 mg by mouth daily. , Disp: , Rfl:  .  gabapentin (NEURONTIN) 300 MG capsule, TAKE 2 CAPSULES BY MOUTH IN THE MORNING AND 3 CAPSULES AT BEDTIME, Disp: 450 capsule, Rfl: 12 .  glipiZIDE (GLUCOTROL XL) 2.5 MG 24 hr tablet, TAKE 1 TABLET BY MOUTH EVERY DAY, Disp: 90 tablet, Rfl: 4 .  glucose blood (CONTOUR NEXT TEST) test strip, Use as instructed to check blood sugar daily, Disp: 100 each, Rfl: 4 .  metFORMIN (GLUCOPHAGE-XR) 500 MG 24 hr tablet, Take 2 tablets (1,000 mg total) by mouth 2 (two) times daily., Disp: 360 tablet, Rfl: 4 .  pravastatin (PRAVACHOL) 40 MG tablet, Take 1 tablet (40 mg total) by mouth daily with lunch., Disp: 90 tablet, Rfl: 4 .  predniSONE (DELTASONE) 2.5 MG tablet, Take 2.5 mg by mouth daily., Disp: , Rfl:  .  TRULICITY 1.5 XF/8.1WE SOPN, INJECT 1.5 MG INTO THE SKIN ONCE A WEEK., Disp: 4 pen, Rfl: 12 .  valsartan-hydrochlorothiazide (DIOVAN-HCT) 80-12.5 MG tablet, TAKE 1 TABLET BY MOUTH EVERY DAY, Disp: 90 tablet, Rfl: 4 .  Vitamin D, Cholecalciferol, 1000 units TABS, Take 1,000 Units by mouth daily., Disp: , Rfl:  .  buPROPion (ZYBAN) 150 MG 12 hr tablet, Take 150 mg by mouth daily., Disp: , Rfl:  .  Coenzyme Q10 (COQ-10) 100 MG CAPS, Take  100 mg by mouth daily., Disp: , Rfl:  .  HYDROcodone-acetaminophen (NORCO/VICODIN) 5-325 MG tablet, TAKE 1 TABLET BY MOUTH EVERY 4 HOURS, Disp: , Rfl:  .  meloxicam (MOBIC) 15 MG tablet, Take 15 mg by mouth daily., Disp: , Rfl:   Review of Systems  Constitutional: Negative.   HENT: Negative.   Eyes: Negative.   Respiratory: Negative.   Cardiovascular: Negative.   Gastrointestinal: Negative.   Endocrine: Negative.   Genitourinary: Negative.   Musculoskeletal: Negative.   Skin: Negative.   Allergic/Immunologic: Negative.   Neurological: Negative.   Hematological: Negative.   Psychiatric/Behavioral: Negative.     Social History   Tobacco Use  . Smoking status:  Never Smoker  . Smokeless tobacco: Never Used  Substance Use Topics  . Alcohol use: Yes    Alcohol/week: 0.0 standard drinks    Comment: Rare - 1 drink      Objective:   BP 130/82   Pulse 76   Temp (!) 96.6 F (35.9 C) (Temporal)   Resp 18   Ht 5' 10"  (1.778 m)   Wt (!) 316 lb 6.4 oz (143.5 kg)   SpO2 94%   BMI 45.40 kg/m  Vitals:   01/05/20 1414  BP: 130/82  Pulse: 76  Resp: 18  Temp: (!) 96.6 F (35.9 C)  TempSrc: Temporal  SpO2: 94%  Weight: (!) 316 lb 6.4 oz (143.5 kg)  Height: 5' 10"  (1.778 m)  Body mass index is 45.4 kg/m.   Physical Exam   General Appearance:    Severely obese male in no acute distress  Eyes:    PERRL, conjunctiva/corneas clear, EOM's intact       Lungs:     Clear to auscultation bilaterally, respirations unlabored  Heart:    Normal heart rate. Normal rhythm. No murmurs, rubs, or gallops.   MS:   All extremities are intact.   Integ:   Thickened, yellow dystrophic left great toe.       Results for orders placed or performed in visit on 01/05/20  POCT glycosylated hemoglobin (Hb A1C)  Result Value Ref Range   Hemoglobin A1C 6.7 (A) 4.0 - 5.6 %   Est. average glucose Bld gHb Est-mCnc 146        Assessment & Plan    1. Type 2 diabetes mellitus with other neurologic complication, without long-term current use of insulin (HCC) Well controlled.  Continue current medications.    2. Hypertension, unspecified type Well controlled.  Continue current medications.    3. Onychomycosis of left great toe - terbinafine (LAMISIL) 250 MG tablet; Take 2 tablets (500 mg total) by mouth daily. Take for 1 week out of every 4 weeks  Dispense: 14 tablet; Refill: 5  Follow up 4-5 months.      Lelon Huh, MD  [facility]

## 2020-01-05 NOTE — Patient Instructions (Signed)
.   Please review the attached list of medications and notify my office if there are any errors.   . Please bring all of your medications to every appointment so we can make sure that our medication list is the same as yours.   

## 2020-01-21 DIAGNOSIS — M316 Other giant cell arteritis: Secondary | ICD-10-CM | POA: Diagnosis not present

## 2020-01-26 DIAGNOSIS — H34232 Retinal artery branch occlusion, left eye: Secondary | ICD-10-CM | POA: Diagnosis not present

## 2020-02-03 DIAGNOSIS — E559 Vitamin D deficiency, unspecified: Secondary | ICD-10-CM | POA: Diagnosis not present

## 2020-02-03 DIAGNOSIS — K5010 Crohn's disease of large intestine without complications: Secondary | ICD-10-CM | POA: Diagnosis not present

## 2020-02-04 DIAGNOSIS — E119 Type 2 diabetes mellitus without complications: Secondary | ICD-10-CM | POA: Diagnosis not present

## 2020-02-04 LAB — HM DIABETES EYE EXAM

## 2020-02-08 ENCOUNTER — Ambulatory Visit: Payer: PPO | Attending: Internal Medicine

## 2020-02-08 DIAGNOSIS — Z23 Encounter for immunization: Secondary | ICD-10-CM

## 2020-02-08 NOTE — Progress Notes (Signed)
   Covid-19 Vaccination Clinic  Name:  Edward King    MRN: [id number] DOB: 1951-02-21  02/08/2020  [name] was observed post Covid-19 immunization for 15 minutes without incidence. He was provided with Vaccine Information Sheet and instruction to access the V-Safe system.   [name] was instructed to call 911 with any severe reactions post vaccine: Marland Kitchen Difficulty breathing  . Swelling of your face and throat  . A fast heartbeat  . A bad rash all over your body  . Dizziness and weakness    Immunizations Administered    Name Date Dose VIS Date Route   Pfizer COVID-19 Vaccine 02/08/2020 12:38 PM 0.3 mL 11/28/2019 Intramuscular   Manufacturer: [place]   Lot: [id number]   [place]: [id number]

## 2020-03-01 ENCOUNTER — Other Ambulatory Visit: Payer: Self-pay | Admitting: Family Medicine

## 2020-03-01 DIAGNOSIS — E1149 Type 2 diabetes mellitus with other diabetic neurological complication: Secondary | ICD-10-CM

## 2020-03-01 NOTE — Telephone Encounter (Signed)
Requested medication (s) are due for refill today:   Yes  Requested medication (s) are on the active medication list:   Yes  Future visit scheduled:   Yes   Last ordered: 11/30/2018  #90  4 refills.  Clinic note:   Returned because Rx from 2019.   Requested Prescriptions  Pending Prescriptions Disp Refills   glipiZIDE (GLUCOTROL XL) 2.5 MG 24 hr tablet [Pharmacy Med Name: GLIPIZIDE ER 2.5 MG TABLET] 90 tablet 4    Sig: TAKE 1 TABLET BY MOUTH EVERY DAY      Endocrinology:  Diabetes - Sulfonylureas Passed - 03/01/2020  1:31 AM      Passed - HBA1C is between 0 and 7.9 and within 180 days    Hemoglobin A1C  Date Value Ref Range Status  01/05/2020 6.7 (A) 4.0 - 5.6 % Final  08/30/2014 6.0 4.2 - 6.3 % Final    Comment:    The American Diabetes Association recommends that a primary goal of therapy should be <7% and that physicians should reevaluate the treatment regimen in patients with HbA1c values consistently >8%.    Hgb A1c MFr Bld  Date Value Ref Range Status  01/24/2019 7.3 (H) 4.8 - 5.6 % Final    Comment:             Prediabetes: 5.7 - 6.4          Diabetes: >6.4          Glycemic control for adults with diabetes: <7.0           Passed - Valid encounter within last 6 months    Recent Outpatient Visits           1 month ago Type 2 diabetes mellitus with other neurologic complication, without long-term current use of insulin ([place])   [facility] Birdie Sons, MD   5 months ago Type 2 diabetes mellitus with other neurologic complication, without long-term current use of insulin [facility])   [facility] Birdie Sons, MD   6 months ago Encounter for [employer] annual wellness exam   [facility], [place], LPN   1 year ago Type 2 diabetes mellitus with other neurologic complication, without long-term current use of insulin [facility])   [facility] Birdie Sons, MD   1 year ago Subacute maxillary  sinusitis   [place], Vickki Muff, [place]       Future Appointments             In 2 months Fisher, Kirstie Peri, MD [facility], [place]

## 2020-03-03 ENCOUNTER — Ambulatory Visit: Payer: PPO | Attending: Internal Medicine

## 2020-03-03 DIAGNOSIS — Z23 Encounter for immunization: Secondary | ICD-10-CM

## 2020-03-03 NOTE — Progress Notes (Signed)
   Covid-19 Vaccination Clinic  Name:  Edward King    MRN: [id number] DOB: 02/26/1951  03/03/2020  Mr. Lipsky was observed post Covid-19 immunization for 15 minutes without incident. He was provided with Vaccine Information Sheet and instruction to access the V-Safe system.   Mr. Mehringer was instructed to call 911 with any severe reactions post vaccine: Marland Kitchen Difficulty breathing  . Swelling of face and throat  . A fast heartbeat  . A bad rash all over body  . Dizziness and weakness   Immunizations Administered    Name Date Dose VIS Date Route   Pfizer COVID-19 Vaccine 03/03/2020  8:16 AM 0.3 mL 11/28/2019 Intramuscular   Manufacturer: [place]   Lot: [id number]   [place]: [id number]

## 2020-04-08 DIAGNOSIS — M316 Other giant cell arteritis: Secondary | ICD-10-CM | POA: Diagnosis not present

## 2020-04-08 DIAGNOSIS — E559 Vitamin D deficiency, unspecified: Secondary | ICD-10-CM | POA: Diagnosis not present

## 2020-04-08 DIAGNOSIS — K5010 Crohn's disease of large intestine without complications: Secondary | ICD-10-CM | POA: Diagnosis not present

## 2020-04-09 DIAGNOSIS — M545 Low back pain: Secondary | ICD-10-CM | POA: Diagnosis not present

## 2020-04-14 DIAGNOSIS — M5416 Radiculopathy, lumbar region: Secondary | ICD-10-CM | POA: Diagnosis not present

## 2020-04-19 DIAGNOSIS — F330 Major depressive disorder, recurrent, mild: Secondary | ICD-10-CM | POA: Diagnosis not present

## 2020-05-03 NOTE — Progress Notes (Signed)
I,Roshena L Chambers,acting as a scribe for Lelon Huh, MD.,have documented all relevant documentation on the behalf of Lelon Huh, MD,as directed by  Lelon Huh, MD while in the presence of Lelon Huh, MD.  Established patient visit   Patient: Edward King   DOB: 1951/09/06   69 y.o. Male  MRN: [id number] Visit Date: 05/04/2020  Today's healthcare provider: Lelon Huh, MD   Chief Complaint  Patient presents with  . Diabetes  . Hypertension   Subjective    HPI Hypertension, follow-up  BP Readings from Last 3 Encounters:  05/04/20 132/80  01/05/20 130/82  09/05/19 125/82   Wt Readings from Last 3 Encounters:  05/04/20 (!) 315 lb (142.9 kg)  01/05/20 (!) 316 lb 6.4 oz (143.5 kg)  09/05/19 (!) 320 lb 6.4 oz (145.3 kg)     He was last seen for hypertension 4 months ago.  BP at that visit was 130/82. Management since that visit includes no change.  He reports good compliance with treatment. He is not having side effects.  He is following a Regular diet. He is exercising. He does not smoke.  Use of agents associated with hypertension: none.   Outside blood pressures are checked occasionally. Symptoms: No chest pain No chest pressure  No palpitations No syncope  No dyspnea No orthopnea  No paroxysmal nocturnal dyspnea No lower extremity edema   Pertinent labs: Lab Results  Component Value Date   CHOL 138 09/09/2019   HDL 63 09/09/2019   LDLCALC 56 09/09/2019   TRIG 104 09/09/2019   CHOLHDL 2.2 09/09/2019   Lab Results  Component Value Date   NA 139 09/09/2019   K 4.3 09/09/2019   CREATININE 1.08 09/09/2019   GFRNONAA 70 09/09/2019   GFRAA 81 09/09/2019   GLUCOSE 111 (H) 09/09/2019     The 10-year ASCVD risk score Mikey Bussing DC Jr., et al., 2013) is: 27.3%   --------------------------------------------------------------------------------------------------- Diabetes Mellitus Type II, Follow-up  Lab Results  Component Value Date   HGBA1C 6.5 (A) 05/04/2020   HGBA1C 6.7 (A) 01/05/2020   HGBA1C 6.5 (A) 09/05/2019   Wt Readings from Last 3 Encounters:  05/04/20 (!) 315 lb (142.9 kg)  01/05/20 (!) 316 lb 6.4 oz (143.5 kg)  09/05/19 (!) 320 lb 6.4 oz (145.3 kg)   Last seen for diabetes 4 months ago.  Management since then includes no change. He reports good compliance with treatment. He is not having side effects.  Symptoms: No fatigue No foot ulcerations  No appetite changes No nausea  No paresthesia of the feet  No polydipsia  No polyuria No visual disturbances   No vomiting     Home blood sugar records: fasting range: 120's  Episodes of hypoglycemia? No    Current insulin regiment: none- using Trulicity, Glipizide and Metformin Most Recent Eye Exam: 02/04/2020 Current exercise: walking Current diet habits: well balanced  ---------------------------------------------------------------------------------------------------      Medications: Outpatient Medications Prior to Visit  Medication Sig  . buPROPion (WELLBUTRIN SR) 150 MG 12 hr tablet Take 150 mg by mouth daily.  . citalopram (CELEXA) 40 MG tablet Take 40 mg by mouth daily.   . Coenzyme Q10 (COQ-10) 100 MG CAPS Take 100 mg by mouth daily.  Marland Kitchen gabapentin (NEURONTIN) 300 MG capsule TAKE 2 CAPSULES BY MOUTH IN THE MORNING AND 3 CAPSULES AT BEDTIME  . glipiZIDE (GLUCOTROL XL) 2.5 MG 24 hr tablet TAKE 1 TABLET BY MOUTH EVERY DAY  . glucose blood (CONTOUR NEXT TEST)  test strip Use as instructed to check blood sugar daily  . meloxicam (MOBIC) 15 MG tablet Take 15 mg by mouth daily.  . metFORMIN (GLUCOPHAGE-XR) 500 MG 24 hr tablet Take 2 tablets (1,000 mg total) by mouth 2 (two) times daily.  . pravastatin (PRAVACHOL) 40 MG tablet Take 1 tablet (40 mg total) by mouth daily with lunch.  . predniSONE (DELTASONE) 2.5 MG tablet Take 2.5 mg by mouth daily.  Marland Kitchen terbinafine (LAMISIL) 250 MG tablet Take 2 tablets (500 mg total) by mouth daily. Take for 1 week  out of every 4 weeks  . valsartan-hydrochlorothiazide (DIOVAN-HCT) 80-12.5 MG tablet TAKE 1 TABLET BY MOUTH EVERY DAY  . Vitamin D, Cholecalciferol, 1000 units TABS Take 1,000 Units by mouth daily.  . [DISCONTINUED] TRULICITY 1.5 WK/0.8UP SOPN INJECT 1.5 MG INTO THE SKIN ONCE A WEEK.  . [DISCONTINUED] buPROPion (ZYBAN) 150 MG 12 hr tablet Take 150 mg by mouth daily.  . [DISCONTINUED] HYDROcodone-acetaminophen (NORCO/VICODIN) 5-325 MG tablet TAKE 1 TABLET BY MOUTH EVERY 4 HOURS   No facility-administered medications prior to visit.    Review of Systems  Constitutional: Negative for appetite change, chills and fever.  Respiratory: Negative for chest tightness, shortness of breath and wheezing.   Cardiovascular: Negative for chest pain and palpitations.  Gastrointestinal: Negative for abdominal pain, nausea and vomiting.  Musculoskeletal: Positive for back pain.      Objective    BP 132/80 (BP Location: Left Arm, Patient Position: Sitting, Cuff Size: Large)   Pulse 71   Temp (!) 96.8 F (36 C) (Temporal)   Resp 18   Wt (!) 315 lb (142.9 kg)   SpO2 95% Comment: room air  BMI 45.20 kg/m    Physical Exam   General: Appearance:    Severely obese male in no acute distress  Eyes:    PERRL, conjunctiva/corneas clear, EOM's intact       Lungs:     Clear to auscultation bilaterally, respirations unlabored  Heart:    Normal heart rate. Normal rhythm. No murmurs, rubs, or gallops.   MS:   All extremities are intact.   Neurologic:   Awake, alert, oriented x 3. No apparent focal neurological           defect.        Results for orders placed or performed in visit on 05/04/20  POCT HgB A1C  Result Value Ref Range   Hemoglobin A1C 6.5 (A) 4.0 - 5.6 %   Est. average glucose Bld gHb Est-mCnc 140     Assessment & Plan     1. Type 2 diabetes mellitus with other neurologic complication, without long-term current use of insulin (HCC) Controlled. Will have patient increase Trulicity from  1.0RP to 83m weekly with next refill. Will discontinue Glipizide after starting the higher dose of Trulicity.  Follow up in 3 months. - POCT HgB A1C - Dulaglutide (TRULICITY) 3 MRX/4.5OPSOPN; Inject 3 mg as directed once a week.  Dispense: 4 pen; Refill: 3 - Comprehensive Metabolic Panel (CMET)  2. Hypertension, unspecified type Stable. Continue current medication. - EKG 12-Lead - Comprehensive Metabolic Panel (CMET)  4. Morbid obesity Is losing weight with Trulicity. Will d/c glipizide and increase Trulicity to 374mweekly.   5. Prostate cancer with I-125 radioactive seed implant He is to schedule follow up with Dr. WoEliberto Ivory - PSA  Return in about 3 months (around 08/04/2020).         DoLelon HuhMD  [phone]  phone) [phone] (fax)  [place]

## 2020-05-04 ENCOUNTER — Ambulatory Visit: Payer: PPO

## 2020-05-04 ENCOUNTER — Other Ambulatory Visit: Payer: Self-pay

## 2020-05-04 ENCOUNTER — Ambulatory Visit (INDEPENDENT_AMBULATORY_CARE_PROVIDER_SITE_OTHER): Payer: PPO | Admitting: Family Medicine

## 2020-05-04 ENCOUNTER — Encounter: Payer: Self-pay | Admitting: Family Medicine

## 2020-05-04 VITALS — BP 132/80 | HR 71 | Temp 96.8°F | Resp 18 | Wt 315.0 lb

## 2020-05-04 DIAGNOSIS — C61 Malignant neoplasm of prostate: Secondary | ICD-10-CM

## 2020-05-04 DIAGNOSIS — Z125 Encounter for screening for malignant neoplasm of prostate: Secondary | ICD-10-CM

## 2020-05-04 DIAGNOSIS — I10 Essential (primary) hypertension: Secondary | ICD-10-CM

## 2020-05-04 DIAGNOSIS — E1149 Type 2 diabetes mellitus with other diabetic neurological complication: Secondary | ICD-10-CM | POA: Diagnosis not present

## 2020-05-04 LAB — POCT GLYCOSYLATED HEMOGLOBIN (HGB A1C)
Est. average glucose Bld gHb Est-mCnc: 140
Hemoglobin A1C: 6.5 % — AB (ref 4.0–5.6)

## 2020-05-04 MED ORDER — TRULICITY 3 MG/0.5ML ~~LOC~~ SOAJ: 3.0000 mg | SUBCUTANEOUS | 3 refills | Status: DC

## 2020-05-04 NOTE — Patient Instructions (Addendum)
.   Please review the attached list of medications and notify my office if there are any errors.   . Please bring all of your medications to every appointment so we can make sure that our medication list is the same as yours.   

## 2020-05-04 NOTE — Progress Notes (Signed)
Established patient visit   Patient: Edward King   DOB: 12/28/1950   69 y.o. Male  MRN: [id number] Visit Date: 08/17/2020  Today's healthcare provider: Lelon Huh, MD   Chief Complaint  Patient presents with   Diabetes   Subjective    HPI Diabetes Mellitus Type II, Follow-up  Lab Results  Component Value Date   HGBA1C 6.5 (A) 05/04/2020   HGBA1C 6.7 (A) 01/05/2020   HGBA1C 6.5 (A) 09/05/2019   Wt Readings from Last 3 Encounters:  08/17/20 (!) 311 lb (141.1 kg)  05/04/20 (!) 315 lb (142.9 kg)  01/05/20 (!) 316 lb 6.4 oz (143.5 kg)   Last seen for diabetes on 05/04/2020.  Management since then includes stopping glipizide which was considered to no longer be needed to control his diabetes, and increase Trulicity to 14m weekly to maintain glucose control and help with weight loss.  .Marland KitchenHe reports good compliance with treatment. He is not having side effects.   Symptoms: Yes fatigue No foot ulcerations  No appetite changes No nausea  No paresthesia of the feet  Yes polydipsia  Yes polyuria (at night) No visual disturbances   No vomiting     Home blood sugar records: fasting range: 120-130  Episodes of hypoglycemia? No    Current insulin regiment: none; takes Trulicity Most Recent Eye Exam: 02/04/2020 Current exercise: none Current diet habits: well balanced  Pertinent Labs: Lab Results  Component Value Date   CHOL 138 09/09/2019   HDL 63 09/09/2019   LDLCALC 56 09/09/2019   TRIG 104 09/09/2019   CHOLHDL 2.2 09/09/2019   Lab Results  Component Value Date   NA 141 05/04/2020   K 4.4 05/04/2020   CREATININE 1.29 (H) 05/04/2020   GFRNONAA 56 (L) 05/04/2020   GFRAA 65 05/04/2020   GLUCOSE 99 05/04/2020     ---------------------------------------------------------------------------------------------------      Medications: Outpatient Medications Prior to Visit  Medication Sig   buPROPion (WELLBUTRIN SR) 150 MG 12 hr tablet Take 150 mg by  mouth daily.   citalopram (CELEXA) 40 MG tablet Take 40 mg by mouth daily.    Coenzyme Q10 (COQ-10) 100 MG CAPS Take 100 mg by mouth daily.   Dulaglutide (TRULICITY) 3 MFY/1.0FBSOPN Inject 3 mg as directed once a week.   gabapentin (NEURONTIN) 300 MG capsule TAKE 2 CAPSULES BY MOUTH IN THE MORNING AND 3 CAPSULES AT BEDTIME   glucose blood (CONTOUR NEXT TEST) test strip Use as instructed to check blood sugar daily   meloxicam (MOBIC) 15 MG tablet Take 15 mg by mouth daily.   metFORMIN (GLUCOPHAGE-XR) 500 MG 24 hr tablet Take 2 tablets (1,000 mg total) by mouth 2 (two) times daily.   pravastatin (PRAVACHOL) 40 MG tablet TAKE 1 TABLET (40 MG TOTAL) BY MOUTH DAILY WITH LUNCH.   valsartan-hydrochlorothiazide (DIOVAN-HCT) 80-12.5 MG tablet TAKE 1 TABLET BY MOUTH EVERY DAY   Vitamin D, Cholecalciferol, 1000 units TABS Take 1,000 Units by mouth daily.   [DISCONTINUED] predniSONE (DELTASONE) 2.5 MG tablet Take 2.5 mg by mouth daily. (Patient not taking: Reported on 08/17/2020)   [DISCONTINUED] terbinafine (LAMISIL) 250 MG tablet Take 2 tablets (500 mg total) by mouth daily. Take for 1 week out of every 4 weeks (Patient not taking: Reported on 08/17/2020)   No facility-administered medications prior to visit.    Review of Systems  Constitutional: Positive for fatigue. Negative for appetite change, chills and fever.  Respiratory: Negative for chest tightness, shortness of breath and  wheezing.   Cardiovascular: Negative for chest pain and palpitations.  Gastrointestinal: Negative for abdominal pain, nausea and vomiting.  Endocrine: Positive for polydipsia and polyuria (at night).      Objective    BP 124/81 (BP Location: Left Arm, Patient Position: Sitting, Cuff Size: Large)    Pulse 76    Temp 98.5 F (36.9 C) (Oral)    Resp 16    Wt (!) 311 lb (141.1 kg)    BMI 44.62 kg/m    Physical Exam  General Appearance:    Alert, cooperative, no distress, obese  Eyes:    PERRL,  conjunctiva/corneas clear, EOM's intact       Lungs:     Clear to auscultation bilaterally, respirations unlabored  Heart:    Normal heart rate. Normal rhythm. No murmurs, rubs, or gallops.   MS:   All extremities are intact.   Neurologic:   Awake, alert, oriented x 3. No apparent focal neurological           defect.        Results for orders placed or performed in visit on 08/17/20  POCT HgB A1C  Result Value Ref Range   Hemoglobin A1C 6.7 (A) 4.0 - 5.6 %   Est. average glucose Bld gHb Est-mCnc 146     Assessment & Plan     1. Type 2 diabetes mellitus with other neurologic complication, without long-term current use of insulin (HCC) Doing well with increase dose of Trulicity. Continue current medications.  Consider increase to 4.5 if not continuing to improve at follow up in 4-5 months. He was noted to have reduction in his eGFR with last labs.  - Renal function panel        The entirety of the information documented in the History of Present Illness, Review of Systems and Physical Exam were personally obtained by me. Portions of this information were initially documented by the CMA and reviewed by me for thoroughness and accuracy.      Lelon Huh, MD  [facility] [phone] (phone) [phone] (fax)  [place]

## 2020-05-05 LAB — COMPREHENSIVE METABOLIC PANEL
ALT: 16 IU/L (ref 0–44)
AST: 14 IU/L (ref 0–40)
Albumin/Globulin Ratio: 1.5 (ref 1.2–2.2)
Albumin: 3.7 g/dL — ABNORMAL LOW (ref 3.8–4.8)
Alkaline Phosphatase: 92 IU/L (ref 48–121)
BUN/Creatinine Ratio: 19 (ref 10–24)
BUN: 24 mg/dL (ref 8–27)
Bilirubin Total: 0.2 mg/dL (ref 0.0–1.2)
CO2: 25 mmol/L (ref 20–29)
Calcium: 8.8 mg/dL (ref 8.6–10.2)
Chloride: 102 mmol/L (ref 96–106)
Creatinine, Ser: 1.29 mg/dL — ABNORMAL HIGH (ref 0.76–1.27)
GFR calc Af Amer: 65 mL/min/{1.73_m2} (ref >59)
GFR calc non Af Amer: 56 mL/min/{1.73_m2} — ABNORMAL LOW (ref >59)
Globulin, Total: 2.5 g/dL (ref 1.5–4.5)
Glucose: 99 mg/dL (ref 65–99)
Potassium: 4.4 mmol/L (ref 3.5–5.2)
Sodium: 141 mmol/L (ref 134–144)
Total Protein: 6.2 g/dL (ref 6.0–8.5)

## 2020-05-05 LAB — PSA: Prostate Specific Ag, Serum: <0.1 ng/mL (ref 0.0–4.0)

## 2020-05-14 ENCOUNTER — Other Ambulatory Visit: Payer: Self-pay | Admitting: Family Medicine

## 2020-05-26 ENCOUNTER — Telehealth: Payer: Self-pay

## 2020-05-26 DIAGNOSIS — M47896 Other spondylosis, lumbar region: Secondary | ICD-10-CM | POA: Diagnosis not present

## 2020-05-26 DIAGNOSIS — G894 Chronic pain syndrome: Secondary | ICD-10-CM | POA: Diagnosis not present

## 2020-05-26 DIAGNOSIS — Z79899 Other long term (current) drug therapy: Secondary | ICD-10-CM | POA: Diagnosis not present

## 2020-05-26 DIAGNOSIS — M25559 Pain in unspecified hip: Secondary | ICD-10-CM | POA: Diagnosis not present

## 2020-05-26 NOTE — Telephone Encounter (Signed)
Copied from [place] [id number]. Topic: General - Other >> May 26, 2020  3:25 PM Keene Breath wrote: Reason for CRM: Patient called to ask the nurse or doctor to send a copy of his EKG to Dr. Thurmond Butts in [place].  He stated that the doctor said he would send it last week but they still have not received it.  Patient also stated he could come by tomorrow and pick up a hard copy if he needed too.  Please advise and call patient to let him know the status at [phone]

## 2020-05-26 NOTE — Telephone Encounter (Signed)
Patient would like a copy printed so that he can pick up to take to  Dr. Thurmond Butts. Copy of EKG printed and left up front.

## 2020-05-27 ENCOUNTER — Other Ambulatory Visit: Payer: Self-pay | Admitting: Family Medicine

## 2020-05-28 ENCOUNTER — Other Ambulatory Visit: Payer: Self-pay | Admitting: Family Medicine

## 2020-07-15 DIAGNOSIS — M316 Other giant cell arteritis: Secondary | ICD-10-CM | POA: Diagnosis not present

## 2020-07-19 DIAGNOSIS — F330 Major depressive disorder, recurrent, mild: Secondary | ICD-10-CM | POA: Diagnosis not present

## 2020-07-21 DIAGNOSIS — I10 Essential (primary) hypertension: Secondary | ICD-10-CM | POA: Diagnosis not present

## 2020-07-21 DIAGNOSIS — R601 Generalized edema: Secondary | ICD-10-CM | POA: Diagnosis not present

## 2020-07-21 DIAGNOSIS — I209 Angina pectoris, unspecified: Secondary | ICD-10-CM | POA: Diagnosis not present

## 2020-07-21 DIAGNOSIS — G4733 Obstructive sleep apnea (adult) (pediatric): Secondary | ICD-10-CM | POA: Diagnosis not present

## 2020-07-21 DIAGNOSIS — E119 Type 2 diabetes mellitus without complications: Secondary | ICD-10-CM | POA: Diagnosis not present

## 2020-07-21 DIAGNOSIS — R0602 Shortness of breath: Secondary | ICD-10-CM | POA: Diagnosis not present

## 2020-07-21 DIAGNOSIS — I890 Lymphedema, not elsewhere classified: Secondary | ICD-10-CM | POA: Diagnosis not present

## 2020-07-21 DIAGNOSIS — F329 Major depressive disorder, single episode, unspecified: Secondary | ICD-10-CM | POA: Diagnosis not present

## 2020-07-21 DIAGNOSIS — M316 Other giant cell arteritis: Secondary | ICD-10-CM | POA: Diagnosis not present

## 2020-07-21 DIAGNOSIS — Z6841 Body Mass Index (BMI) 40.0 and over, adult: Secondary | ICD-10-CM | POA: Diagnosis not present

## 2020-08-02 DIAGNOSIS — R142 Eructation: Secondary | ICD-10-CM | POA: Diagnosis not present

## 2020-08-02 DIAGNOSIS — E538 Deficiency of other specified B group vitamins: Secondary | ICD-10-CM | POA: Diagnosis not present

## 2020-08-02 DIAGNOSIS — K5010 Crohn's disease of large intestine without complications: Secondary | ICD-10-CM | POA: Diagnosis not present

## 2020-08-04 ENCOUNTER — Telehealth: Payer: Self-pay | Admitting: *Deleted

## 2020-08-04 NOTE — Chronic Care Management (AMB) (Signed)
  Chronic Care Management   Note  08/04/2020 Name: ODES LOLLI MRN: [id number] DOB: 09/12/1951  FABRICE DYAL is a 69 y.o. year old male who is a primary care patient of Caryn Section, Kirstie Peri, MD. I reached out to Janalee Dane by phone today in response to a referral sent by Mr. Shoaib Siefker Gassert's health plan.     Mr. Harm was given information about Chronic Care Management services today including:  1. CCM service includes personalized support from designated clinical staff supervised by his physician, including individualized plan of care and coordination with other care providers 2. 24/7 contact phone numbers for assistance for urgent and routine care needs. 3. Service will only be billed when office clinical staff spend 20 minutes or more in a month to coordinate care. 4. Only one practitioner may furnish and bill the service in a calendar month. 5. The patient may stop CCM services at any time (effective at the end of the month) by phone call to the office staff. 6. The patient will be responsible for cost sharing (co-pay) of up to 20% of the service fee (after annual deductible is met).  Patient agreed to services and verbal consent obtained.   Follow up plan: Telephone appointment with care management team member scheduled for: 08/27/2020  [place] [facility]  [place],  [address] Direct Dial: [email or web address] Website: [email or web address]

## 2020-08-05 DIAGNOSIS — H353132 Nonexudative age-related macular degeneration, bilateral, intermediate dry stage: Secondary | ICD-10-CM | POA: Diagnosis not present

## 2020-08-17 ENCOUNTER — Ambulatory Visit (INDEPENDENT_AMBULATORY_CARE_PROVIDER_SITE_OTHER): Payer: PPO | Admitting: Family Medicine

## 2020-08-17 ENCOUNTER — Encounter: Payer: Self-pay | Admitting: Family Medicine

## 2020-08-17 ENCOUNTER — Other Ambulatory Visit: Payer: Self-pay

## 2020-08-17 VITALS — BP 124/81 | HR 76 | Temp 98.5°F | Resp 16 | Ht 70.0 in | Wt 311.0 lb

## 2020-08-17 DIAGNOSIS — E1149 Type 2 diabetes mellitus with other diabetic neurological complication: Secondary | ICD-10-CM

## 2020-08-17 DIAGNOSIS — M316 Other giant cell arteritis: Secondary | ICD-10-CM | POA: Diagnosis not present

## 2020-08-17 LAB — POCT GLYCOSYLATED HEMOGLOBIN (HGB A1C)
Est. average glucose Bld gHb Est-mCnc: 146
Hemoglobin A1C: 6.7 % — AB (ref 4.0–5.6)

## 2020-08-17 NOTE — Patient Instructions (Addendum)
.   Please review the attached list of medications and notify my office if there are any errors.   . Please bring all of your medications to every appointment so we can make sure that our medication list is the same as yours.  I recommend that you get a flu vaccine this year. Please call our office at [phone] at your earliest convenience to schedule a flu shot.

## 2020-08-18 LAB — RENAL FUNCTION PANEL
Albumin: 3.8 g/dL (ref 3.8–4.8)
BUN/Creatinine Ratio: 12 (ref 10–24)
BUN: 12 mg/dL (ref 8–27)
CO2: 26 mmol/L (ref 20–29)
Calcium: 9 mg/dL (ref 8.6–10.2)
Chloride: 101 mmol/L (ref 96–106)
Creatinine, Ser: 1.04 mg/dL (ref 0.76–1.27)
GFR calc Af Amer: 84 mL/min/{1.73_m2} (ref >59)
GFR calc non Af Amer: 73 mL/min/{1.73_m2} (ref >59)
Glucose: 104 mg/dL — ABNORMAL HIGH (ref 65–99)
Phosphorus: 3 mg/dL (ref 2.8–4.1)
Potassium: 4.5 mmol/L (ref 3.5–5.2)
Sodium: 140 mmol/L (ref 134–144)

## 2020-08-27 ENCOUNTER — Telehealth: Payer: Self-pay

## 2020-08-27 ENCOUNTER — Telehealth: Payer: Self-pay | Admitting: *Deleted

## 2020-08-27 ENCOUNTER — Telehealth: Payer: PPO

## 2020-08-27 NOTE — Chronic Care Management (AMB) (Signed)
..  Have you seen any other providers since your last visit? **no Any changes in your medications or health? no Any side effects from any medications? no Do you have an symptoms or problems not managed by your medications? no Any concerns about your health right now? no Has your provider asked that you check blood pressure, blood sugar, or follow special diet at home? yes Do you get any type of exercise on a regular basis? no Can you think of a goal you would like to reach for your health? Yes exercise more  Do you have any problems getting your medications? yes Is there anything that you would like to discuss during the appointment? yes   Please bring medications and supplements to appointment

## 2020-08-27 NOTE — Progress Notes (Signed)
     Established patient visit   Patient: Edward King   DOB: 03-Nov-1951   69 y.o. Male  MRN: [id number] Visit Date: 08/30/2020  Today's healthcare provider: Lelon Huh, MD   Chief Complaint  Patient presents with  . Skin Problem   Subjective    Arm Pain   Patient presents in office today with complaints of sore under his right underarm that has been present for the past week and half. Patient states that area under his arm started off as a small bump and gradually became larger in size, in the past 24hrs patient reports drainage from site and states that it is tender to the touch. Patient denies any changes scratch/cutt or injury to underarm he denies any changes in soap or deodorant products..     Social History   Tobacco Use  . Smoking status: Never Smoker  . Smokeless tobacco: Never Used  Vaping Use  . Vaping Use: Never used  Substance Use Topics  . Alcohol use: Yes    Alcohol/week: 0.0 standard drinks    Comment: Rare - 1 drink  . Drug use: No   No Known Allergies     Medications: Outpatient Medications Prior to Visit  Medication Sig  . buPROPion (WELLBUTRIN SR) 150 MG 12 hr tablet Take 150 mg by mouth daily.  . citalopram (CELEXA) 40 MG tablet Take 40 mg by mouth daily.   . Coenzyme Q10 (COQ-10) 100 MG CAPS Take 100 mg by mouth daily.  . Cyanocobalamin (VITAMIN B-12 PO) Take by mouth.  . Dulaglutide (TRULICITY) 3 IR/4.8NI SOPN Inject 3 mg as directed once a week.  . gabapentin (NEURONTIN) 300 MG capsule TAKE 2 CAPSULES BY MOUTH IN THE MORNING AND 3 CAPSULES AT BEDTIME  . glucose blood (CONTOUR NEXT TEST) test strip Use as instructed to check blood sugar daily  . meloxicam (MOBIC) 15 MG tablet Take 15 mg by mouth daily.  . mesalamine (APRISO) 0.375 g 24 hr capsule TAKE 4 CAPSULES BY MOUTH ONCE DAILY  . metFORMIN (GLUCOPHAGE-XR) 500 MG 24 hr tablet Take 2 tablets (1,000 mg total) by mouth 2 (two) times daily.  . pravastatin (PRAVACHOL) 40 MG tablet TAKE 1  TABLET (40 MG TOTAL) BY MOUTH DAILY WITH LUNCH.  . valsartan-hydrochlorothiazide (DIOVAN-HCT) 80-12.5 MG tablet TAKE 1 TABLET BY MOUTH EVERY DAY  . Vitamin D, Cholecalciferol, 1000 units TABS Take 1,000 Units by mouth daily.   No facility-administered medications prior to visit.    Review of Systems No fevers, chills, sweats, nausea or vomiting.     Objective    BP 128/80   Pulse 81   Temp 97.6 F (36.4 C) (Oral)   Resp 16   Wt (!) 309 lb (140.2 kg)   SpO2 97%   BMI 44.34 kg/m    Physical Exam  About 12m x 8 mm oval erythematous very tender lesion draining scan purulent material right axillae.     Assessment & Plan     1. Need for influenza vaccination  - Flu Vaccine QUAD High Dose IM (Fluad)  2. Hidradenitis suppurativa of right axilla  - Aerobic culture  - sulfamethoxazole-trimethoprim (BACTRIM DS) 800-160 MG tablet; Take 1 tablet by mouth 2 (two) times daily for 7 days.  Dispense: 14 tablet; Refill: 0   No follow-ups on file.         DLelon Huh MD  [phone](phone) [phone](fax)  [place]

## 2020-08-27 NOTE — Chronic Care Management (AMB) (Signed)
  Chronic Care Management   Note  08/27/2020 Name: SHANN MERRICK MRN: [id number] DOB: 03-Mar-1951  Janalee Dane is a 69 y.o. year old male who is a primary care patient of Caryn Section, Kirstie Peri, MD and is actively engaged with the care management team. I reached out to Janalee Dane by phone today to assist with re-scheduling an initial visit with the Pharmacist  Follow up plan: Telephone appointment with care management team member scheduled for: 09/13/2020  [place] [facility]

## 2020-08-30 ENCOUNTER — Encounter: Payer: Self-pay | Admitting: Family Medicine

## 2020-08-30 ENCOUNTER — Ambulatory Visit (INDEPENDENT_AMBULATORY_CARE_PROVIDER_SITE_OTHER): Payer: PPO | Admitting: Family Medicine

## 2020-08-30 ENCOUNTER — Telehealth: Payer: PPO

## 2020-08-30 ENCOUNTER — Other Ambulatory Visit: Payer: Self-pay

## 2020-08-30 VITALS — BP 128/80 | HR 81 | Temp 97.6°F | Resp 16 | Wt 309.0 lb

## 2020-08-30 DIAGNOSIS — L732 Hidradenitis suppurativa: Secondary | ICD-10-CM

## 2020-08-30 DIAGNOSIS — Z23 Encounter for immunization: Secondary | ICD-10-CM

## 2020-08-30 MED ORDER — SULFAMETHOXAZOLE-TRIMETHOPRIM 800-160 MG PO TABS: 1.0000 | ORAL_TABLET | Freq: Two times a day (BID) | ORAL | 0 refills | Status: DC

## 2020-08-30 NOTE — Patient Instructions (Addendum)
.   Please review the attached list of medications and notify my office if there are any errors.   . Please bring all of your medications to every appointment so we can make sure that our medication list is the same as yours.    You can apply neosporin plus which includes an anesthetic that helps with pain

## 2020-09-01 ENCOUNTER — Telehealth: Payer: Self-pay | Admitting: Family Medicine

## 2020-09-01 NOTE — Telephone Encounter (Signed)
Copied from [place] [id number]. Topic: Medicare AWV >> Sep 01, 2020  9:47 AM Cher Nakai R wrote: Reason for CRM: Left message for patient to call back and schedule Medicare Annual Wellness Visit (AWV) either virtually or in office.  Last AWV 05/22/2018  Please schedule at anytime with [facility] Health Advisor.  If any questions, please contact me at [phone]

## 2020-09-02 ENCOUNTER — Telehealth: Payer: Self-pay

## 2020-09-02 LAB — AEROBIC CULTURE

## 2020-09-02 NOTE — Telephone Encounter (Signed)
Copied from [place] [id number]. Topic: General - Other >> Sep 02, 2020  4:07 PM Rainey Pines A wrote: Patient would like a callback from Dr. Sabino Snipes nurse to go over his most recent lab results. Please advise

## 2020-09-03 ENCOUNTER — Encounter: Payer: Self-pay | Admitting: Family Medicine

## 2020-09-03 ENCOUNTER — Telehealth: Payer: Self-pay | Admitting: *Deleted

## 2020-09-03 DIAGNOSIS — L732 Hidradenitis suppurativa: Secondary | ICD-10-CM

## 2020-09-03 DIAGNOSIS — Z8614 Personal history of Methicillin resistant Staphylococcus aureus infection: Secondary | ICD-10-CM | POA: Insufficient documentation

## 2020-09-03 MED ORDER — SULFAMETHOXAZOLE-TRIMETHOPRIM 800-160 MG PO TABS: 1.0000 | ORAL_TABLET | Freq: Two times a day (BID) | ORAL | 0 refills | Status: DC

## 2020-09-03 NOTE — Telephone Encounter (Signed)
-----   Message from Birdie Sons, MD sent at 09/03/2020  7:56 AM EDT ----- Culture grew MRSA which is sensitive to the sulfa antibiotic that was prescribed. He needs to take this for an additional week. Please send refill to his pharmacy for 14 more tablets.

## 2020-09-03 NOTE — Addendum Note (Signed)
Addended by: Julieta Bellini on: 09/03/2020 03:22 PM   Modules accepted: Orders

## 2020-09-03 NOTE — Telephone Encounter (Signed)
Attempted to contact patient to see if he has received 14 additional tablets from his pharmacy for antibiotic.

## 2020-09-03 NOTE — Telephone Encounter (Signed)
LMOVM to return call. Also went ahead and sent in refill.

## 2020-09-06 ENCOUNTER — Other Ambulatory Visit: Payer: Self-pay | Admitting: Family Medicine

## 2020-09-06 DIAGNOSIS — E1149 Type 2 diabetes mellitus with other diabetic neurological complication: Secondary | ICD-10-CM

## 2020-09-06 NOTE — Telephone Encounter (Signed)
Pt has already been advised of lab results per 9/17 message.

## 2020-09-13 ENCOUNTER — Telehealth: Payer: PPO

## 2020-09-13 NOTE — Chronic Care Management (AMB) (Unsigned)
   Chronic Care Management Pharmacy  Name: TAVIOUS GRIESINGER  MRN: [id number] DOB: [birth year]  Chief Complaint/ HPI  Elyn Aquas Dewan,  69 y.o. , male presents for their Initial CCM visit with the clinical pharmacist via telephone due to COVID-19 Pandemic.  PCP : Birdie Sons, MD  Their chronic conditions include: ***  Office Visits: 9/13 MRSA, [place], BP 128/80 P 81 Wt 309 BMI 44.3, bump on arm, Bactrim DS bid x 14d total 8/31 DM, [place], BP 124/81 P 76 Wt 311 BMI 44.6, A1c 1.6%, Consider Trulicity 9.6VE qwk at next visit  Consult Visit: 8/16 Crohn's, Mondamin, BP 128/84 P 86 Wt 309.5 BMI 44.4, Gaviscon helps, recent d/c prednisone for temporal arteritis  Medications: Outpatient Encounter Medications as of 09/13/2020  Medication Sig  . buPROPion (WELLBUTRIN SR) 150 MG 12 hr tablet Take 150 mg by mouth daily.  . citalopram (CELEXA) 40 MG tablet Take 40 mg by mouth daily.   . Coenzyme Q10 (COQ-10) 100 MG CAPS Take 100 mg by mouth daily.  . Cyanocobalamin (VITAMIN B-12 PO) Take by mouth.  . gabapentin (NEURONTIN) 300 MG capsule TAKE 2 CAPSULES BY MOUTH IN THE MORNING AND 3 CAPSULES AT BEDTIME  . glucose blood (CONTOUR NEXT TEST) test strip Use as instructed to check blood sugar daily  . meloxicam (MOBIC) 15 MG tablet Take 15 mg by mouth daily.  . mesalamine (APRISO) 0.375 g 24 hr capsule TAKE 4 CAPSULES BY MOUTH ONCE DAILY  . metFORMIN (GLUCOPHAGE-XR) 500 MG 24 hr tablet Take 2 tablets (1,000 mg total) by mouth 2 (two) times daily.  . pravastatin (PRAVACHOL) 40 MG tablet TAKE 1 TABLET (40 MG TOTAL) BY MOUTH DAILY WITH LUNCH.  . TRULICITY 3 LF/8.1OF SOPN INJECT 3 MG AS DIRECTED ONCE A WEEK.  . valsartan-hydrochlorothiazide (DIOVAN-HCT) 80-12.5 MG tablet TAKE 1 TABLET BY MOUTH EVERY DAY  . Vitamin D, Cholecalciferol, 1000 units TABS Take 1,000 Units by mouth daily.   No facility-administered encounter medications on file as of 09/13/2020.     Current Diagnosis/Assessment:  Goals  Addressed   None    Diabetes   Recent Relevant Labs: Lab Results  Component Value Date/Time   HGBA1C 6.7 (A) 08/17/2020 02:04 PM   HGBA1C 6.5 (A) 05/04/2020 01:44 PM   HGBA1C 7.3 (H) 01/24/2019 02:20 PM   HGBA1C 8.0 (H) 05/22/2018 03:47 PM   HGBA1C 6.0 08/30/2014 04:50 AM   MICROALBUR 20 01/24/2019 02:26 PM   MICROALBUR negative 11/12/2017 02:14 PM     Checking BG: {CHL HP Blood Glucose Monitoring [phone]}  Recent FBG Readings: Recent pre-meal BG readings: *** Recent 2hr PP BG readings:  *** Recent HS BG readings: *** Patient has failed these meds in past: *** Patient is currently {CHL Controlled/Uncontrolled:[phone]} on the following medications: ***  Last diabetic Foot exam:  Lab Results  Component Value Date/Time   HMDIABEYEEXA No Retinopathy 02/04/2020 12:00 AM    Last diabetic Eye exam: No results found for: HMDIABFOOTEX   We discussed: {CHL HP Upstream Pharmacy discussion:[phone]}  Plan  Continue {CHL HP Upstream Pharmacy [phone]}   Medication Management   Pt uses [facility] for all medications Uses pill box? {Yes or If no, why not?:20788} Pt endorses ***% compliance  We discussed: {Pharmacy options:24294}  Plan  {US Pharmacy BPZW:25852}    Follow up: *** month phone visit  ***

## 2020-09-20 ENCOUNTER — Telehealth: Payer: PPO

## 2020-09-20 NOTE — Chronic Care Management (AMB) (Deleted)
Chronic Care Management Pharmacy  Name: Edward King  MRN: [id number] DOB: 1951/03/06  Chief Complaint/ HPI  Edward King,  69 y.o. , male presents for their Initial CCM visit with the clinical pharmacist via telephone due to COVID-19 Pandemic.  PCP : Birdie Sons, MD  Their chronic conditions include: ***  Office Visits:***  Consult Visit:***  Medications: Outpatient Encounter Medications as of 09/20/2020  Medication Sig  . buPROPion (WELLBUTRIN SR) 150 MG 12 hr tablet Take 150 mg by mouth daily.  . citalopram (CELEXA) 40 MG tablet Take 40 mg by mouth daily.   . Coenzyme Q10 (COQ-10) 100 MG CAPS Take 100 mg by mouth daily.  . Cyanocobalamin (VITAMIN B-12 PO) Take by mouth.  . gabapentin (NEURONTIN) 300 MG capsule TAKE 2 CAPSULES BY MOUTH IN THE MORNING AND 3 CAPSULES AT BEDTIME  . glucose blood (CONTOUR NEXT TEST) test strip Use as instructed to check blood sugar daily  . meloxicam (MOBIC) 15 MG tablet Take 15 mg by mouth daily.  . mesalamine (APRISO) 0.375 g 24 hr capsule TAKE 4 CAPSULES BY MOUTH ONCE DAILY  . metFORMIN (GLUCOPHAGE-XR) 500 MG 24 hr tablet Take 2 tablets (1,000 mg total) by mouth 2 (two) times daily.  . pravastatin (PRAVACHOL) 40 MG tablet TAKE 1 TABLET (40 MG TOTAL) BY MOUTH DAILY WITH LUNCH.  . TRULICITY 3 JJ/0.0XF SOPN INJECT 3 MG AS DIRECTED ONCE A WEEK.  . valsartan-hydrochlorothiazide (DIOVAN-HCT) 80-12.5 MG tablet TAKE 1 TABLET BY MOUTH EVERY DAY  . Vitamin D, Cholecalciferol, 1000 units TABS Take 1,000 Units by mouth daily.   No facility-administered encounter medications on file as of 09/20/2020.     Current Diagnosis/Assessment:  Goals Addressed   None    Diabetes   Recent Relevant Labs: Lab Results  Component Value Date/Time   HGBA1C 6.7 (A) 08/17/2020 02:04 PM   HGBA1C 6.5 (A) 05/04/2020 01:44 PM   HGBA1C 7.3 (H) 01/24/2019 02:20 PM   HGBA1C 8.0 (H) 05/22/2018 03:47 PM   HGBA1C 6.0 08/30/2014 04:50 AM   MICROALBUR 20  01/24/2019 02:26 PM   MICROALBUR negative 11/12/2017 02:14 PM    Kidney Function Lab Results  Component Value Date/Time   CREATININE 1.04 08/17/2020 02:34 PM   CREATININE 1.29 (H) 05/04/2020 02:28 PM   CREATININE 1.10 08/30/2014 04:50 AM   CREATININE 1.16 08/29/2014 04:46 PM   GFRNONAA 73 08/17/2020 02:34 PM   GFRNONAA >60 08/30/2014 04:50 AM   GFRAA 84 08/17/2020 02:34 PM   GFRAA >60 08/30/2014 04:50 AM   K 4.5 08/17/2020 02:34 PM   K 4.4 05/04/2020 02:28 PM   K 4.5 08/30/2014 04:50 AM   K 3.5 08/29/2014 04:46 PM    Checking BG: {CHL HP Blood Glucose Monitoring Frequency:[phone]}  Recent FBG Readings: Recent pre-meal BG readings: *** Recent 2hr PP BG readings:  *** Recent HS BG readings: *** Patient has failed these meds in past: *** Patient is currently {CHL Controlled/[phone]} on the following medications: ***  Last diabetic Foot exam:  Lab Results  Component Value Date/Time   HMDIABEYEEXA No Retinopathy 02/04/2020 12:00 AM    Last diabetic Eye exam: No results found for: HMDIABFOOTEX   We discussed:  Increased Trulicity?  Plan  Continue {CHL HP Upstream Pharmacy Plans:[phone]}   Hyperlipidemia   LDL goal < ***  Lipid Panel     Component Value Date/Time   CHOL 138 09/09/2019 1340   CHOL 174 08/30/2014 0450   TRIG 104 09/09/2019 1340   TRIG  73 08/30/2014 0450   HDL 63 09/09/2019 1340   HDL 40 08/30/2014 0450   LDLCALC 56 09/09/2019 1340   LDLCALC 119 (H) 08/30/2014 0450    Hepatic Function Latest Ref Rng & Units 08/17/2020 05/04/2020 09/09/2019  Total Protein 6.0 - 8.5 g/dL - 6.2 6.4  Albumin 3.8 - 4.8 g/dL 3.8 3.7(L) 3.7(L)  AST 0 - 40 IU/L - 14 13  ALT 0 - 44 IU/L - 16 13  Alk Phosphatase 48 - 121 IU/L - 92 86  Total Bilirubin 0.0 - 1.2 mg/dL - 0.2 0.4     The 10-year ASCVD risk score Mikey Bussing DC Jr., et al., 2013) is: 28.3%   Values used to calculate the score:     Age: 27 years     Sex: Male     Is Non-Hispanic African  American: No     Diabetic: Yes     Tobacco smoker: No     Systolic Blood Pressure: 211 mmHg     Is BP treated: Yes     HDL Cholesterol: 63 mg/dL     Total Cholesterol: 138 mg/dL   Patient has failed these meds in past: *** Patient is currently {CHL Controlled/[phone]} on the following medications:  . ***  We discussed:  {CHL HP Upstream Pharmacy discussion:[phone]}  Plan  Continue {CHL HP Upstream Pharmacy Plans:[phone]}  Medication Management   [facility] Uses pill box? {Yes or If no, why not?:20788} Pt endorses ***% compliance  We discussed: {Pharmacy options:24294}  Plan  {[facility] Pharm[facility] HERD:40814}    Follow up: *** month phone visit  ***

## 2020-11-13 ENCOUNTER — Other Ambulatory Visit: Payer: Self-pay | Admitting: Family Medicine

## 2020-11-15 ENCOUNTER — Telehealth: Payer: Self-pay

## 2020-11-15 NOTE — Telephone Encounter (Signed)
Copied from [place] [id number]. Topic: General - Other >> Nov 10, 2020  5:01 PM Edward King, [place] C wrote: Reason for CRM: pt would like a call back to schedule a covid booster.   CB: [phone]

## 2020-11-17 ENCOUNTER — Other Ambulatory Visit: Payer: Self-pay

## 2020-11-17 ENCOUNTER — Ambulatory Visit (INDEPENDENT_AMBULATORY_CARE_PROVIDER_SITE_OTHER): Payer: PPO

## 2020-11-17 DIAGNOSIS — Z23 Encounter for immunization: Secondary | ICD-10-CM | POA: Diagnosis not present

## 2020-11-23 ENCOUNTER — Telehealth: Payer: Self-pay

## 2020-11-23 ENCOUNTER — Ambulatory Visit: Payer: Self-pay

## 2020-11-23 NOTE — Telephone Encounter (Signed)
Patient's wife called and the patient was given the phone to discuss his symptoms. He says his symptoms started on Saturday-headache, body aches, runny nose, sinus drainage and congestion, cough, sore throat. He says the headache and sore throat have gone away, no fever. He says he feels the same as yesterday, the cough is worse at night and in the morning due to the sinus drainage. He denies SOB. Virtual appointment scheduled for tomorrow at 1140 with Dr. Caryn Section after speaking to Arbie Cookey, [facility] at the office. Patient advised to quarantine x 14 days, care advice given, he verbalized understanding.  Reason for Disposition . HIGH RISK for severe COVID complications (e.g., age > 28 years, obesity with BMI > 25, pregnant, chronic lung disease or other chronic medical condition)  (Exception: Already seen by PCP and no new or worsening symptoms.)  Answer Assessment - Initial Assessment Questions 1. COVID-19 DIAGNOSIS: "Who made your Coronavirus (COVID-19) diagnosis?" "Was it confirmed by a positive lab test?" If not diagnosed by a HCP, ask "Are there lots of cases (community spread) where you live?" (See public health department website, if unsure)     [facility] 2. COVID-19 EXPOSURE: "Was there any known exposure to [place] before the symptoms began?" CDC Definition of close contact: within 6 feet (2 meters) for a total of 15 minutes or more over a 24-hour period.      Yes 3. ONSET: "When did the COVID-19 symptoms start?"      Saturday 4. WORST SYMPTOM: "What is your worst symptom?" (e.g., cough, fever, shortness of breath, muscle aches)     Sinus drainage and congestion 5. COUGH: "Do you have a cough?" If Yes, ask: "How bad is the cough?"       Yes, worse at night and early morning 6. FEVER: "Do you have a fever?" If Yes, ask: "What is your temperature, how was it measured, and when did it start?"     No 7. RESPIRATORY STATUS: "Describe your breathing?" (e.g., shortness of breath, wheezing,  unable to speak)      No 8. BETTER-SAME-WORSE: "Are you getting better, staying the same or getting worse compared to yesterday?"  If getting worse, ask, "In what way?"     Same 9. HIGH RISK DISEASE: "Do you have any chronic medical problems?" (e.g., asthma, heart or lung disease, weak immune system, obesity, etc.)     Diabetes 10. PREGNANCY: "Is there any chance you are pregnant?" "When was your last menstrual period?"      N/A 11. OTHER SYMPTOMS: "Do you have any other symptoms?"  (e.g., chills, fatigue, headache, loss of smell or taste, muscle pain, sore throat; new loss of smell or taste especially support the diagnosis of COVID-19)       Sinus congestions, runny nose, fatigue, loss of smell and taste, neck and shoulder ache  Protocols used: CORONAVIRUS (COVID-19) DIAGNOSED OR SUSPECTED-A-AH

## 2020-11-23 NOTE — Telephone Encounter (Signed)
Copied from [place] [id number]. Topic: General - Other >> Nov 23, 2020 10:26 AM Edward King wrote: Reason for CRM: Patient called in to inquire of Dr Caryn Section or his nurse on what is his next step since he was just diagnosed with King positive Covid test result. Please call Ph# [phone]

## 2020-11-23 NOTE — Telephone Encounter (Signed)
Patient's wife called, patient will be triaged by [facility], see triage encounter.

## 2020-11-23 NOTE — Telephone Encounter (Signed)
Virtual office visit scheduled for 11/24/2020.

## 2020-11-24 ENCOUNTER — Other Ambulatory Visit: Payer: Self-pay

## 2020-11-24 ENCOUNTER — Encounter: Payer: Self-pay | Admitting: Family Medicine

## 2020-11-24 ENCOUNTER — Ambulatory Visit (INDEPENDENT_AMBULATORY_CARE_PROVIDER_SITE_OTHER): Payer: PPO | Admitting: Family Medicine

## 2020-11-24 DIAGNOSIS — R059 Cough, unspecified: Secondary | ICD-10-CM | POA: Diagnosis not present

## 2020-11-24 DIAGNOSIS — U071 COVID-19: Secondary | ICD-10-CM | POA: Diagnosis not present

## 2020-11-24 MED ORDER — HYDROCODONE-HOMATROPINE 5-1.5 MG/5ML PO SYRP: 5.0000 mL | ORAL_SOLUTION | Freq: Three times a day (TID) | ORAL | 0 refills | Status: DC | PRN

## 2020-11-24 NOTE — Progress Notes (Signed)
MyChart Video Visit    Virtual Visit via Video Note   This visit type was conducted due to national recommendations for restrictions regarding the COVID-19 Pandemic (e.g. social distancing) in an effort to limit this patient's exposure and mitigate transmission in our community. This patient is at least at moderate risk for complications without adequate follow up. This format is felt to be most appropriate for this patient at this time. Physical exam was limited by quality of the video and audio technology used for the visit.   Patient location: home Provider location: [facility]  I discussed the limitations of evaluation and management by telemedicine and the availability of in person appointments. The patient expressed understanding and agreed to proceed.  Patient: Edward King   DOB: 05/28/1951   69 y.o. Male  MRN: [id number] Visit Date: 11/24/2020  Today's healthcare provider: Lelon Huh, MD   Chief Complaint  Patient presents with  . Cough   Subjective    Cough This is a new problem. Episode onset: 5 days ago. The problem has been unchanged. The cough is productive of sputum (brown-yellow colored sputum). Associated symptoms include eye redness (left eye), myalgias and a sore throat. Pertinent negatives include no chest pain, chills, fever, shortness of breath or wheezing.    Patient reports [facility]. Developed sx about 2 days his wife developed same symptoms, and was also covid positive. He did get a Covid booster about 3 days before onset of sx.     Medications: Outpatient Medications Prior to Visit  Medication Sig  . buPROPion (WELLBUTRIN SR) 150 MG 12 hr tablet Take 150 mg by mouth daily.  . citalopram (CELEXA) 40 MG tablet Take 40 mg by mouth daily.   . Coenzyme Q10 (COQ-10) 100 MG CAPS Take 100 mg by mouth daily.  . Cyanocobalamin (VITAMIN B-12 PO) Take by mouth.  . gabapentin (NEURONTIN) 300 MG capsule TAKE 2  CAPSULES BY MOUTH IN THE MORNING AND 3 CAPSULES AT BEDTIME  . glucose blood (CONTOUR NEXT TEST) test strip Use as instructed to check blood sugar daily  . metFORMIN (GLUCOPHAGE-XR) 500 MG 24 hr tablet TAKE 2 TABLETS BY MOUTH TWICE A DAY  . pravastatin (PRAVACHOL) 40 MG tablet TAKE 1 TABLET (40 MG TOTAL) BY MOUTH DAILY WITH LUNCH.  . TRULICITY 3 ZL/9.3TT SOPN INJECT 3 MG AS DIRECTED ONCE A WEEK.  . valsartan-hydrochlorothiazide (DIOVAN-HCT) 80-12.5 MG tablet TAKE 1 TABLET BY MOUTH EVERY DAY  . Vitamin D, Cholecalciferol, 1000 units TABS Take 1,000 Units by mouth daily.  . meloxicam (MOBIC) 15 MG tablet Take 15 mg by mouth daily. (Patient not taking: Reported on 11/24/2020)  . mesalamine (APRISO) 0.375 g 24 hr capsule TAKE 4 CAPSULES BY MOUTH ONCE DAILY (Patient not taking: Reported on 11/24/2020)   No facility-administered medications prior to visit.    Review of Systems  Constitutional: Positive for fatigue. Negative for appetite change, chills and fever.       Loss of taste  HENT: Positive for congestion (head and chest congestion), sinus pressure, sinus pain and sore throat.   Eyes: Positive for redness (left eye).  Respiratory: Positive for cough. Negative for chest tightness, shortness of breath and wheezing.   Cardiovascular: Negative for chest pain and palpitations.  Gastrointestinal: Negative for abdominal pain, nausea and vomiting.  Musculoskeletal: Positive for myalgias.      Objective    There were no vitals taken for this visit.  Physical Exam   Awake, alert, oriented x 3. In no apparent distress   Assessment & Plan     1. Cough  - HYDROcodone-homatropine (HYCODAN) 5-1.5 MG/5ML syrup; Take 5 mLs by mouth every 8 (eight) hours as needed for cough.  Dispense: 120 mL; Refill: 0  2. COVID-19  Moderate symptoms less than 7 days. Has been 10 months since initially vaccinated. Has booster on 11-17-2020, but Covid exposure was likely prior to booster immune response.  Multiple comorbidities. Secure chat message sent to [place] [facility]    Video connection was lost when less than 50% of the duration of the visit was complete, at which time the remainder of the visit was completed via audio only.      I discussed the assessment and treatment plan with the patient. The patient was provided an opportunity to ask questions and all were answered. The patient agreed with the plan and demonstrated an understanding of the instructions.   The patient was advised to call back or seek an in-person evaluation if the symptoms worsen or if the condition fails to improve as anticipated.  I provided 8 minutes of non-face-to-face time during this encounter.  The entirety of the information documented in the History of Present Illness, Review of Systems and Physical Exam were personally obtained by me. Portions of this information were initially documented by the CMA and reviewed by me for thoroughness and accuracy.     Lelon Huh, MD [facility] [phone] (phone) [phone] (fax)  [place]

## 2020-11-25 ENCOUNTER — Other Ambulatory Visit: Payer: Self-pay | Admitting: Nurse Practitioner

## 2020-11-25 ENCOUNTER — Ambulatory Visit: Payer: Self-pay | Admitting: *Deleted

## 2020-11-25 DIAGNOSIS — U071 COVID-19: Secondary | ICD-10-CM

## 2020-11-25 NOTE — Progress Notes (Signed)
I connected by phone with Edward King on 11/25/2020 at 4:54 PM to discuss the potential use of a treatment for mild to moderate COVID-19 viral infection in non-hospitalized patients.  This patient is a 69 y.o. male that meets the FDA criteria for Emergency Use Authorization of bamlanivimab/etesevimab, casirivimab\imdevimab, or sotrovimab  Has a (+) direct SARS-CoV-2 viral test result  Has mild or moderate COVID-19   Is ? 70 years of age and weighs ? 40 kg  Is NOT hospitalized due to COVID-19  Is NOT requiring oxygen therapy or requiring an increase in baseline oxygen flow rate due to COVID-19  Is within 10 days of symptom onset  Has at least one of the high risk factor(s) for progression to severe COVID-19 and/or hospitalization as defined in EUA.  Specific high risk criteria : Older age (>/= 70 yo), BMI > 25, Diabetes and Cardiovascular disease or hypertension   I have spoken and communicated the following to the patient or parent/caregiver:  1. FDA has authorized the emergency use of bamlanivimab/etesevimab, casirivimab\imdevimab, or sotrovimab for the treatment of mild to moderate COVID-19 in adults and pediatric patients with positive results of direct SARS-CoV-2 viral testing who are 82 years of age and older weighing at least 40 kg, and who are at high risk for progressing to severe COVID-19 and/or hospitalization.  2. The significant known and potential risks and benefits of bamlanivimab/etesevimab, casirivimab\imdevimab, or sotrovimab, and the extent to which such potential risks and benefits are unknown.  3. Information on available alternative treatments and the risks and benefits of those alternatives, including clinical trials.  4. Patients treated with bamlanivimab/etesevimab, casirivimab\imdevimab, or sotrovimab should continue to self-isolate and use infection control measures (e.g., wear mask, isolate, social distance, avoid sharing personal items, clean and disinfect  "high touch" surfaces, and frequent handwashing) according to CDC guidelines.   5. The patient or parent/caregiver has the option to accept or refuse bamlanivimab/etesevimab, casirivimab\imdevimab, or sotrovimab.  After reviewing this information with the patient, the patient has agreed to receive one of the available covid 19 monoclonal antibodies and will be provided an appropriate fact sheet prior to infusion.Beckey Rutter, [place], AGNP-C [phone] ([place])

## 2020-11-25 NOTE — Telephone Encounter (Signed)
Patient called with wife and requesting when MAB infusion clinic will contact him if he is eligible for MAB infusion. Reviewed team will review his medical information and if he meets criteria they will contact him. Patient verbalized understanding. Will send email and submit patient information to MAB infusion clinic.   Reason for Disposition . [1] Caller is not with the adult (patient) AND [2] probable NON-URGENT symptoms  Answer Assessment - Initial Assessment Questions 1. REASON FOR CALL or QUESTION: "What is your reason for calling today?" or "How can I best help you?" or "What question do you have that I can help answer?"     When will the MAB infusion clinic call back?  Protocols used: INFORMATION ONLY CALL - NO TRIAGE-A-AH

## 2020-11-26 ENCOUNTER — Ambulatory Visit (HOSPITAL_COMMUNITY)
Admission: RE | Admit: 2020-11-26 | Discharge: 2020-11-26 | Disposition: A | Discharge status: Home / Self Care | Payer: Medicare Other | Source: Ambulatory Visit | Attending: Pulmonary Disease | Admitting: Pulmonary Disease

## 2020-11-26 ENCOUNTER — Other Ambulatory Visit: Payer: Self-pay

## 2020-11-26 DIAGNOSIS — U071 COVID-19: Secondary | ICD-10-CM | POA: Diagnosis present

## 2020-11-26 DIAGNOSIS — Z23 Encounter for immunization: Secondary | ICD-10-CM | POA: Insufficient documentation

## 2020-11-26 MED ORDER — FAMOTIDINE IN NACL 20-0.9 MG/50ML-% IV SOLN: 20.0000 mg | Freq: Once | INTRAVENOUS | Status: DC | PRN

## 2020-11-26 MED ORDER — SODIUM CHLORIDE 0.9 % IV SOLN
1200.0000 mg | Freq: Once | INTRAVENOUS | Status: AC
  Administered 2020-11-26: 1200 mg via INTRAVENOUS

## 2020-11-26 MED ORDER — SODIUM CHLORIDE 0.9 % IV SOLN: INTRAVENOUS | Status: DC | PRN

## 2020-11-26 MED ORDER — EPINEPHRINE 0.3 MG/0.3ML IJ SOAJ: 0.3000 mg | Freq: Once | INTRAMUSCULAR | Status: DC | PRN

## 2020-11-26 MED ORDER — ALBUTEROL SULFATE HFA 108 (90 BASE) MCG/ACT IN AERS: 2.0000 | INHALATION_SPRAY | Freq: Once | RESPIRATORY_TRACT | Status: DC | PRN

## 2020-11-26 MED ORDER — DIPHENHYDRAMINE HCL 50 MG/ML IJ SOLN: 50.0000 mg | Freq: Once | INTRAMUSCULAR | Status: DC | PRN

## 2020-11-26 MED ORDER — METHYLPREDNISOLONE SODIUM SUCC 125 MG IJ SOLR: 125.0000 mg | Freq: Once | INTRAMUSCULAR | Status: DC | PRN

## 2020-11-26 NOTE — Progress Notes (Signed)
  Diagnosis: COVID-19  Physician: Asencion Noble, MD  Procedure: Covid Infusion Clinic Med: casirivimab\imdevimab infusion - Provided patient with casirivimab\imdevimab fact sheet for patients, parents and caregivers prior to infusion.  Complications: No immediate complications noted.  Discharge: Discharged home   Edward King 11/26/2020

## 2020-11-26 NOTE — Discharge Instructions (Signed)
10 Things You Can Do to Manage Your COVID-19 Symptoms at Home If you have possible or confirmed COVID-19: 1. Stay home from work and school. And stay away from other public places. If you must go out, avoid using any kind of public transportation, ridesharing, or taxis. 2. Monitor your symptoms carefully. If your symptoms get worse, call your healthcare provider immediately. 3. Get rest and stay hydrated. 4. If you have a medical appointment, call the healthcare provider ahead of time and tell them that you have or may have COVID-19. 5. For medical emergencies, call 911 and notify the dispatch personnel that you have or may have COVID-19. 6. Cover your cough and sneezes with a tissue or use the inside of your elbow. 7. Wash your hands often with soap and water for at least 20 seconds or clean your hands with an alcohol-based hand sanitizer that contains at least 60% alcohol. 8. As much as possible, stay in a specific room and away from other people in your home. Also, you should use a separate bathroom, if available. If you need to be around other people in or outside of the home, wear a mask. 9. Avoid sharing personal items with other people in your household, like dishes, towels, and bedding. 10. Clean all surfaces that are touched often, like counters, tabletops, and doorknobs. Use household cleaning sprays or wipes according to the label instructions. cdc.gov/coronavirus 06/18/2019 This information is not intended to replace advice given to you by your health care provider. Make sure you discuss any questions you have with your health care provider. Document Revised: 11/20/2019 Document Reviewed: 11/20/2019 Elsevier Patient Education  2020 Elsevier Inc. What types of side effects do monoclonal antibody drugs cause?  Common side effects  In general, the more common side effects caused by monoclonal antibody drugs include: . Allergic reactions, such as hives or itching . Flu-like signs and  symptoms, including chills, fatigue, fever, and muscle aches and pains . Nausea, vomiting . Diarrhea . Skin rashes . Low blood pressure   The CDC is recommending patients who receive monoclonal antibody treatments wait at least 90 days before being vaccinated.  Currently, there are no data on the safety and efficacy of mRNA COVID-19 vaccines in persons who received monoclonal antibodies or convalescent plasma as part of COVID-19 treatment. Based on the estimated half-life of such therapies as well as evidence suggesting that reinfection is uncommon in the 90 days after initial infection, vaccination should be deferred for at least 90 days, as a precautionary measure until additional information becomes available, to avoid interference of the antibody treatment with vaccine-induced immune responses. If you have any questions or concerns after the infusion please call the Advanced Practice Provider on call at [phone]. This number is ONLY intended for your use regarding questions or concerns about the infusion post-treatment side-effects.  Please do not provide this number to others for use. For return to work notes please contact your primary care provider.   If someone you know is interested in receiving treatment please have them call the COVID hotline at [phone].   

## 2020-11-26 NOTE — Progress Notes (Signed)
Patient reviewed Fact Sheet for Patients, Parents, and Caregivers for Emergency Use Authorization (EUA) of Casi/Regen for the Treatment of Coronavirus. Patient also reviewed and is agreeable to the estimated cost of treatment. Patient is agreeable to proceed.    

## 2020-12-20 ENCOUNTER — Ambulatory Visit: Payer: Self-pay | Admitting: Family Medicine

## 2020-12-22 ENCOUNTER — Encounter: Payer: Self-pay | Admitting: Family Medicine

## 2020-12-22 ENCOUNTER — Ambulatory Visit (INDEPENDENT_AMBULATORY_CARE_PROVIDER_SITE_OTHER): Payer: PPO | Admitting: Family Medicine

## 2020-12-22 ENCOUNTER — Other Ambulatory Visit: Payer: Self-pay

## 2020-12-22 VITALS — BP 126/86 | HR 67 | Temp 97.9°F | Resp 18 | Wt 302.0 lb

## 2020-12-22 DIAGNOSIS — E1149 Type 2 diabetes mellitus with other diabetic neurological complication: Secondary | ICD-10-CM | POA: Diagnosis not present

## 2020-12-22 DIAGNOSIS — L03811 Cellulitis of head [any part, except face]: Secondary | ICD-10-CM | POA: Diagnosis not present

## 2020-12-22 DIAGNOSIS — L732 Hidradenitis suppurativa: Secondary | ICD-10-CM | POA: Diagnosis not present

## 2020-12-22 LAB — POCT GLYCOSYLATED HEMOGLOBIN (HGB A1C)
Est. average glucose Bld gHb Est-mCnc: 146
Hemoglobin A1C: 6.7 % — AB (ref 4.0–5.6)

## 2020-12-22 MED ORDER — SULFAMETHOXAZOLE-TRIMETHOPRIM 800-160 MG PO TABS: 1.0000 | ORAL_TABLET | Freq: Two times a day (BID) | ORAL | 0 refills | Status: AC

## 2020-12-22 MED ORDER — BACTROBAN NASAL 2 % NA OINT: 1.0000 "application " | TOPICAL_OINTMENT | Freq: Two times a day (BID) | NASAL | 0 refills | Status: DC

## 2020-12-22 NOTE — Progress Notes (Signed)
Established patient visit   Patient: Edward King   DOB: May 30, 1951   70 y.o. Male  MRN: [id number] Visit Date: 12/22/2020  Today's healthcare provider: Lelon Huh, MD   Chief Complaint  Patient presents with  . Diabetes  . Hypertension   Subjective    HPI  Diabetes Mellitus Type II, Follow-up  Lab Results  Component Value Date   HGBA1C 6.7 (A) 12/22/2020   HGBA1C 6.7 (A) 08/17/2020   HGBA1C 6.5 (A) 05/04/2020   Wt Readings from Last 3 Encounters:  12/22/20 (!) 302 lb (137 kg)  08/30/20 (!) 309 lb (140.2 kg)  08/17/20 (!) 311 lb (141.1 kg)   Last seen for diabetes 5 months ago.  Management since then includes continuing same medications.  Consider increasing Trulicity to 4.5 if not continuing to improve at follow up in 4-5 months. He reports good compliance with treatment. He is not having side effects.  Symptoms: Yes fatigue No foot ulcerations  No appetite changes No nausea  No paresthesia of the feet  No polydipsia  No polyuria No visual disturbances   No vomiting     Home blood sugar records: fasting range: 120's  Episodes of hypoglycemia? No    Current insulin regiment: none Most Recent Eye Exam: 02/04/2020 Current exercise: none Current diet habits: well balanced  Pertinent Labs: Lab Results  Component Value Date   CHOL 138 09/09/2019   HDL 63 09/09/2019   LDLCALC 56 09/09/2019   TRIG 104 09/09/2019   CHOLHDL 2.2 09/09/2019   Lab Results  Component Value Date   NA 140 08/17/2020   K 4.5 08/17/2020   CREATININE 1.04 08/17/2020   GFRNONAA 73 08/17/2020   GFRAA 84 08/17/2020   GLUCOSE 104 (H) 08/17/2020     ---------------------------------------------------------------------------------------------------  Hypertension, follow-up  BP Readings from Last 3 Encounters:  12/22/20 126/86  11/26/20 109/67  08/30/20 128/80   Wt Readings from Last 3 Encounters:  12/22/20 (!) 302 lb (137 kg)  08/30/20 (!) 309 lb (140.2 kg)  08/17/20  (!) 311 lb (141.1 kg)     He was last seen for hypertension 8 months ago.  BP at that visit was 132/80. Management since that visit includes continue same medications.  He reports good compliance with treatment. He is not having side effects.  He is following a Regular diet. He is not exercising. He does not smoke.  Use of agents associated with hypertension: none.   Outside blood pressures are not checked. Symptoms: No chest pain No chest pressure  No palpitations No syncope  No dyspnea No orthopnea  No paroxysmal nocturnal dyspnea No lower extremity edema   Pertinent labs: Lab Results  Component Value Date   CHOL 138 09/09/2019   HDL 63 09/09/2019   LDLCALC 56 09/09/2019   TRIG 104 09/09/2019   CHOLHDL 2.2 09/09/2019   Lab Results  Component Value Date   NA 140 08/17/2020   K 4.5 08/17/2020   CREATININE 1.04 08/17/2020   GFRNONAA 73 08/17/2020   GFRAA 84 08/17/2020   GLUCOSE 104 (H) 08/17/2020     The 10-year ASCVD risk score Mikey Bussing DC Jr., et al., 2013) is: 27.6%   ---------------------------------------------------------------------------------------------------  He also has has had red painful nose for the last few weeks that started when he had Covid and with very runny nose. Covid sx have resolved, but nose is very painful.      Medications: Outpatient Medications Prior to Visit  Medication Sig  . buPROPion (  WELLBUTRIN SR) 150 MG 12 hr tablet Take 150 mg by mouth daily.  . citalopram (CELEXA) 40 MG tablet Take 40 mg by mouth daily.   . Coenzyme Q10 (COQ-10) 100 MG CAPS Take 100 mg by mouth daily.  . Cyanocobalamin (VITAMIN B-12 PO) Take by mouth.  . gabapentin (NEURONTIN) 300 MG capsule TAKE 2 CAPSULES BY MOUTH IN THE MORNING AND 3 CAPSULES AT BEDTIME  . glucose blood (CONTOUR NEXT TEST) test strip Use as instructed to check blood sugar daily  . meloxicam (MOBIC) 15 MG tablet Take 15 mg by mouth daily.  . mesalamine (APRISO) 0.375 g 24 hr capsule TAKE 4  CAPSULES BY MOUTH ONCE DAILY  . metFORMIN (GLUCOPHAGE-XR) 500 MG 24 hr tablet TAKE 2 TABLETS BY MOUTH TWICE A DAY  . pravastatin (PRAVACHOL) 40 MG tablet TAKE 1 TABLET (40 MG TOTAL) BY MOUTH DAILY WITH LUNCH.  . TRULICITY 3 HW/3.8UE SOPN INJECT 3 MG AS DIRECTED ONCE A WEEK.  . valsartan-hydrochlorothiazide (DIOVAN-HCT) 80-12.5 MG tablet TAKE 1 TABLET BY MOUTH EVERY DAY  . Vitamin D, Cholecalciferol, 1000 units TABS Take 1,000 Units by mouth daily.  . [DISCONTINUED] HYDROcodone-homatropine (HYCODAN) 5-1.5 MG/5ML syrup Take 5 mLs by mouth every 8 (eight) hours as needed for cough. (Patient not taking: Reported on 12/22/2020)   No facility-administered medications prior to visit.    Review of Systems  Constitutional: Negative for appetite change, chills and fever.  HENT: Positive for rhinorrhea.        Nasal pain  Respiratory: Negative for chest tightness, shortness of breath and wheezing.   Cardiovascular: Positive for leg swelling (unchanged from baseline). Negative for chest pain and palpitations.  Gastrointestinal: Negative for abdominal pain, nausea and vomiting.      Objective    BP 126/86 (BP Location: Right Arm, Patient Position: Sitting, Cuff Size: Large)   Pulse 67   Temp 97.9 F (36.6 C) (Temporal)   Resp 18   Wt (!) 302 lb (137 kg)   SpO2 94% Comment: room air  BMI 43.33 kg/m    Physical Exam  General Appearance:    Alert, cooperative, no distress, obese  Eyes:    PERRL, conjunctiva/corneas clear, EOM's intact       ENT:     Small pustular lesion that is not draining on tip of right nares. Skin around nose is diffusely inflamed and slightly swollen.   Heart:    Normal heart rate. Normal rhythm. No murmurs, rubs, or gallops.       Results for orders placed or performed in visit on 12/22/20  POCT HgB A1C  Result Value Ref Range   Hemoglobin A1C 6.7 (A) 4.0 - 5.6 %   Est. average glucose Bld gHb Est-mCnc 146     Assessment & Plan     1. Type 2 diabetes mellitus  with other neurologic complication, without long-term current use of insulin (HCC) Stable, doing well on Trulicity. Discussed potentially increasing to 4.5 to get A1c closer to 6, which would also likely help him lose more weight. He just had 3.63m prescription filled. Will consider dose change at follow up in 4 months.   2. Cellulitis of head except face (nose)  - sulfamethoxazole-trimethoprim (BACTRIM DS) 800-160 MG tablet; Take 1 tablet by mouth 2 (two) times daily for 7 days.  Dispense: 14 tablet; Refill: 0   - mupirocin nasal ointment (BACTROBAN NASAL) 2 %; Place 1 application into the nose 2 (two) times daily. Use one-half of tube in each nostril twice  daily for five (5) days. After application, press sides of nose together and gently massage.  Dispense: 10 g; Refill: 0  3. Hidradenitis suppurativa of right axilla Resolve with sulfa antibiotic. Culture was positive for MRSA  Follow up 4 months.      The entirety of the information documented in the History of Present Illness, Review of Systems and Physical Exam were personally obtained by me. Portions of this information were initially documented by the CMA and reviewed by me for thoroughness and accuracy.      Lelon Huh, MD  [facility] [phone] (phone) [phone] (fax)  [place]

## 2020-12-24 DIAGNOSIS — M5416 Radiculopathy, lumbar region: Secondary | ICD-10-CM | POA: Diagnosis not present

## 2020-12-28 ENCOUNTER — Telehealth: Payer: Self-pay

## 2020-12-28 DIAGNOSIS — L03811 Cellulitis of head [any part, except face]: Secondary | ICD-10-CM

## 2020-12-28 MED ORDER — AMOXICILLIN-POT CLAVULANATE 875-125 MG PO TABS
1.0000 | ORAL_TABLET | Freq: Two times a day (BID) | ORAL | 0 refills | Status: AC
Start: 2020-12-28 — End: 2021-01-04

## 2020-12-28 NOTE — Telephone Encounter (Signed)
Spoke with the patient, and he reports that he is still having pain inside of his nose. He has one more dose left of the abx, and he feels that his symptoms have not gotten any better. He reports that the sore has scabbed over, but it is very painful to touch. Please advise. Thanks!

## 2020-12-28 NOTE — Telephone Encounter (Signed)
Copied from [place] [id number]. Topic: General - Inquiry >> Dec 27, 2020 12:22 PM Gillis Ends D wrote: Reason for CRM: Patient states that he is horrendous  pain and the mediation prescribe hasn't given any change. It appears to be getting worse instead of better. He would like a call back from Dr. Caryn Section or his nurse. He can be reached at [phone]. Please advise >> Dec 27, 2020  3:18 PM Tessa Lerner A wrote: Patient called back stressing the desire to be called back today, if possible. Patient is in pain.

## 2020-12-28 NOTE — Telephone Encounter (Signed)
Advised patient as below.  

## 2020-12-28 NOTE — Telephone Encounter (Signed)
Will change antibiotic to Augmentin, have sent prescription to [facility].

## 2020-12-29 DIAGNOSIS — M5416 Radiculopathy, lumbar region: Secondary | ICD-10-CM | POA: Diagnosis not present

## 2021-01-06 ENCOUNTER — Telehealth: Payer: Self-pay | Admitting: Family Medicine

## 2021-01-06 NOTE — Telephone Encounter (Signed)
Copied from [place] [id number]. Topic: Medicare AWV >> Jan 06, 2021  1:58 PM Cher Nakai R wrote: Reason for CRM:   Left message for patient to call back and schedule Medicare Annual Wellness Visit (AWV) in office.   If not able to come in office, please offer to do virtually or by telephone.   Last AWV 05/22/2018  Please schedule at anytime with [facility] Health Advisor.  If any questions, please contact me at [phone]

## 2021-01-17 DIAGNOSIS — F330 Major depressive disorder, recurrent, mild: Secondary | ICD-10-CM | POA: Diagnosis not present

## 2021-02-08 ENCOUNTER — Other Ambulatory Visit: Payer: Self-pay | Admitting: Family Medicine

## 2021-02-16 ENCOUNTER — Other Ambulatory Visit: Payer: Self-pay | Admitting: Family Medicine

## 2021-02-21 DIAGNOSIS — M5416 Radiculopathy, lumbar region: Secondary | ICD-10-CM | POA: Diagnosis not present

## 2021-02-24 ENCOUNTER — Other Ambulatory Visit: Payer: Self-pay | Admitting: Family Medicine

## 2021-02-24 DIAGNOSIS — E1149 Type 2 diabetes mellitus with other diabetic neurological complication: Secondary | ICD-10-CM

## 2021-03-10 NOTE — Progress Notes (Signed)
Subjective:   Edward King is a 70 y.o. male who presents for Medicare Annual/Subsequent preventive examination.  I connected with Edward King today by telephone and verified that I am speaking with the correct person using two identifiers. Location patient: home Location provider: work Persons participating in the virtual visit: patient, provider.   I discussed the limitations, risks, security and privacy concerns of performing an evaluation and management service by telephone and the availability of in person appointments. I also discussed with the patient that there may be a patient responsible charge related to this service. The patient expressed understanding and verbally consented to this telephonic visit.    Interactive audio and video telecommunications were attempted between this provider and patient, however failed, due to patient having technical difficulties OR patient did not have access to video capability.  We continued and completed visit with audio only.   Review of Systems    N/A  Cardiac Risk Factors include: advanced age (>2mn, >>63women);diabetes mellitus;male gender;dyslipidemia;hypertension;obesity (BMI >30kg/m2)     Objective:    There were no vitals filed for this visit. There is no height or weight on file to calculate BMI.  Advanced Directives 03/14/2021 08/20/2019 07/31/2018 07/31/2018 05/22/2018 02/13/2018 05/09/2017  Does Patient Have a Medical Advance Directive? No No No No No No No  Would patient like information on creating a medical advance directive? No - Patient declined - No - Patient declined No - Patient declined Yes (MAU/Ambulatory/Procedural Areas - Information given) No - Patient declined Yes (ED - Information included in AVS)    Current Medications (verified) Outpatient Encounter Medications as of 03/14/2021  Medication Sig  . buPROPion (WELLBUTRIN SR) 150 MG 12 hr tablet Take 150 mg by mouth daily.  . citalopram (CELEXA) 40 MG tablet Take 40  mg by mouth daily.   . Coenzyme Q10 (COQ-10) 100 MG CAPS Take 2,000 mg by mouth daily.  . Cyanocobalamin (VITAMIN B-12 PO) Take 1,000 mcg by mouth daily at 6 (six) AM.  . gabapentin (NEURONTIN) 300 MG capsule TAKE 2 CAPSULES BY MOUTH IN THE MORNING AND 3 CAPSULES AT BEDTIME  . glucose blood (CONTOUR NEXT TEST) test strip Use as instructed to check blood sugar daily  . mesalamine (APRISO) 0.375 g 24 hr capsule Take 375 mg by mouth daily.  . metFORMIN (GLUCOPHAGE-XR) 500 MG 24 hr tablet Take 2 tablets (1,000 mg total) by mouth 2 (two) times daily.  . pravastatin (PRAVACHOL) 40 MG tablet TAKE 1 TABLET (40 MG TOTAL) BY MOUTH DAILY WITH LUNCH.  . TRULICITY 3 MMA/2.6JFSOPN INJECT 3 MG AS DIRECTED ONCE A WEEK.  . valsartan-hydrochlorothiazide (DIOVAN-HCT) 80-12.5 MG tablet TAKE 1 TABLET BY MOUTH EVERY DAY  . Vitamin D, Cholecalciferol, 1000 units TABS Take 2,000 Units by mouth daily.  . meloxicam (MOBIC) 15 MG tablet Take 15 mg by mouth daily. (Patient not taking: Reported on 03/14/2021)  . mupirocin nasal ointment (BACTROBAN NASAL) 2 % Place 1 application into the nose 2 (two) times daily. Use one-half of tube in each nostril twice daily for five (5) days. After application, press sides of nose together and gently massage. (Patient not taking: Reported on 03/14/2021)   No facility-administered encounter medications on file as of 03/14/2021.    Allergies (verified) Patient has no known allergies.   History: Past Medical History:  Diagnosis Date  . Arthritis   . Cat bite 07/31/2018  . Cellulitis of right leg without foot   . Colitis   . Depression   .  Diabetes mellitus without complication ([place])   . Giant cell arteritis ([place]) 06/15/2015  . History of chicken pox   . History of measles   . History of mumps   . Hypertension   . Lymph edema    both legs.  mostly left  . Neuromuscular disorder ([place])   . Prostate CA ([place]) 2016   Radiation seed implants.  . Stroke ([place])   . Temporal arteritis  (HCC)    Clinically dx Dr. Jefm Bryant. Negative temporal artrtery bx   Past Surgical History:  Procedure Laterality Date  . ABDOMINOPLASTY    . Carotid Doppler Ultrasound  08/22/2014   [facility]; Minimal plaque right. Minimal thisckening left Anterograde flow vertebrals  . COLONOSCOPY WITH PROPOFOL N/A 02/13/2018   Procedure: COLONOSCOPY WITH PROPOFOL;  Surgeon: Manya Silvas, MD;  Location: [facility] ENDOSCOPY;  Service: Endoscopy;  Laterality: N/A;  . CT of the head  08/22/2014   [facility]. Normal  . DOPPLER ECHOCARDIOGRAPHY  08/29/2014   EF=60-65%. Normal LVEF  . Myocardial Perfusion Scan  09/10/2012   Poor exercsie tolerance, but no evidence of stress induced myocardial ischemia. EF=66%. Dr. Clayborn Bigness  . NASAL SEPTOPLASTY W/ TURBINOPLASTY Bilateral 02/10/2016   Procedure: NASAL SEPTOPLASTY WITH INFERIOR TURBINATE REDUCTION;  Surgeon: Carloyn Manner, MD;  Location: [facility] ORS;  Service: ENT;  Laterality: Bilateral;  . pannectomy    . TEMPORAL ARTERY BIOPSY / LIGATION Left    Family History  Problem Relation Age of Onset  . Colon cancer Mother   . Coronary artery disease Mother   . Prostate cancer Father   . Kidney disease Father   . Dementia Brother   . Diabetes Neg Hx    Social History   Socioeconomic History  . Marital status: Married    Spouse name: Not on file  . Number of children: 1  . Years of education: Not on file  . Highest education level: Some college, no degree  Occupational History  . 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 Smoking status: Never Smoker  . Smokeless tobacco: Never Used  Vaping Use  . Vaping Use: Never used  Substance and Sexual Activity  . Alcohol use: Yes    Alcohol/week: 0.0 standard drinks    Comment: Rare - 1 drink  . Drug use: No  . Sexual activity: Not on file  Other Topics Concern  . Not on file  Social History Narrative  . Not on file   Social Determinants of Health    Financial Resource Strain: Low Risk   . Difficulty of Paying Living Expenses: Not hard at all  Food Insecurity: No Food Insecurity  . Worried About Charity fundraiser in the Last Year: Never true  . Ran Out of Food in the Last Year: Never true  Transportation Needs: No Transportation Needs  . Lack of Transportation (Medical): No  . Lack of Transportation (Non-Medical): No  Physical Activity: Insufficiently Active  . Days of Exercise per Week: 1 day  . Minutes of Exercise per Session: 10 min  Stress: Stress Concern Present  . Feeling of Stress : To some extent  Social Connections: Moderately Integrated  . Frequency of Communication with Friends and Family: More than three times a week  . Frequency of Social Gatherings with Friends and Family: Three times a week  . Attends Religious Services: More than 4 times per year  . Active Member of Clubs or Organizations:  No  . Attends Club or Organization Meetings: Never  . Marital Status: Married    Tobacco Counseling Counseling given: Not Answered   Clinical Intake:  Pre-visit preparation completed: Yes  Pain : No/denies pain     Nutritional Risks: None Diabetes: Yes  How often do you need to have someone help you when you read instructions, pamphlets, or other written materials from your doctor or pharmacy?: 1 - Never  Diabetic? Yes  Nutrition Risk Assessment:  Has the patient had any N/V/D within the last 2 months?  No  Does the patient have any non-healing wounds?  No  Has the patient had any unintentional weight loss or weight gain?  No   Diabetes:  Is the patient diabetic?  Yes  If diabetic, was a CBG obtained today?  No  Did the patient bring in their glucometer from home?  No  How often do you monitor your CBG's? Once every couple days.   Financial Strains and Diabetes Management:  Are you having any financial strains with the device, your supplies or your medication? No .  Does the patient want to be seen  by Chronic Care Management for management of their diabetes?  No  Would the patient like to be referred to a Nutritionist or for Diabetic Management?  No   Diabetic Exams:  Diabetic Eye Exam: Overdue for diabetic eye exam. Pt has been advised about the importance in completing this exam.  Diabetic Foot Exam: Overdue, Pt has been advised about the importance in completing this exam.    Interpreter Needed?: No  Information entered by :: [facility], LPN   Activities of Daily Living In your present state of health, do you have any difficulty performing the following activities: 03/14/2021 12/22/2020  Hearing? N Y  Vision? N Y  Difficulty concentrating or making decisions? N N  Walking or climbing stairs? Y N  Comment Due to arthritis pains in both knees. -  Dressing or bathing? N N  Doing errands, shopping? N N  Preparing Food and eating ? N -  Using the Toilet? N -  In the past six months, have you accidently leaked urine? N -  Do you have problems with loss of bowel control? N -  Managing your Medications? N -  Managing your Finances? N -  Housekeeping or managing your Housekeeping? N -  Some recent data might be hidden    Patient Care Team: Birdie Sons, MD as PCP - General (Family Medicine) Yolonda Kida, MD as Consulting Physician (Cardiology) Emmaline Kluver., MD as Consulting Physician (Rheumatology) Royston Cowper, MD as Consulting Physician (Urology) Thurmond Butts Orson Ape, MD as Attending Physician (Psychiatry) Eulogio Bear, MD as Consulting Physician (Ophthalmology) Pilar Jarvis, MD as Referring Physician (Pain Medicine)  Indicate any recent Medical Services you may have received from other than [facility] providers in the past year (date may be approximate).     Assessment:   This is a routine wellness examination for Edward King.  Hearing/Vision screen No exam data present  Dietary issues and exercise activities discussed: Current Exercise  Habits: Home exercise routine, Type of exercise: walking, Time (Minutes): 10, Intensity: Mild, Exercise limited by: None identified  Goals    . Exercise 3x per week (30 min per time)     Recommend increasing exercise. Pt to start walking 3 days a week for 30 minutes.      Depression Screen PHQ 2/9 Scores 03/14/2021 08/17/2020 08/20/2019 04/01/2019 01/01/2019 08/28/2018 05/22/2018  PHQ - 2 Score 1 0 1 1 1 2 2   PHQ- 9 Score - - - - 4 6 6     Fall Risk Fall Risk  03/14/2021 01/05/2020 08/20/2019 04/01/2019 01/01/2019  Falls in the past year? 0 0 1 0 0  Number falls in past yr: 0 0 0 - -  Injury with Fall? 0 0 0 - -  Risk for fall due to : - - - - -  Risk for fall due to: Comment - - - - -  Follow up - Falls evaluation completed Falls prevention discussed - -    FALL RISK PREVENTION PERTAINING TO THE HOME:  Any stairs in or around the home? Yes  If so, are there any without handrails? No  Home free of loose throw rugs in walkways, pet beds, electrical cords, etc? No  Adequate lighting in your home to reduce risk of falls? No   ASSISTIVE DEVICES UTILIZED TO PREVENT FALLS:  Life alert? No  Use of a cane, walker or w/c? No  Grab bars in the bathroom? Yes  Shower chair or bench in shower? No  Elevated toilet seat or a handicapped toilet? Yes    Cognitive Function: Normal cognitive status assessed by observation by this Nurse Health Advisor. No abnormalities found.       6CIT Screen 08/20/2019 05/22/2018 05/09/2017  What Year? 0 points 0 points 0 points  What month? 0 points 0 points 0 points  What time? 0 points 0 points 0 points  Count back from 20 2 points 0 points 0 points  Months in reverse 0 points 4 points 2 points  Repeat phrase 2 points 8 points 4 points  Total Score 4 12 6     Immunizations Immunization History  Administered Date(s) Administered  . Fluad Quad(high Dose 65+) 09/05/2019, 08/30/2020  . Influenza Whole 10/18/2012  . Influenza, High Dose Seasonal PF 10/13/2017,  10/22/2018  . Influenza,inj,Quad PF,6+ Mos 08/30/2015  . Influenza,inj,quad, With Preservative 09/17/2018  . Influenza-Unspecified 08/30/2015  . PFIZER(Purple Top)SARS-COV-2 Vaccination 02/08/2020, 03/03/2020, 11/17/2020  . Pneumococcal Conjugate-13 11/17/2015  . Pneumococcal Polysaccharide-23 02/01/2006, 05/11/2017  . Tdap 05/11/2017, 08/02/2018  . Zoster 04/29/2012    TDAP status: Up to date  Flu Vaccine status: Up to date  Pneumococcal vaccine status: Up to date  Covid-19 vaccine status: Completed vaccines  Qualifies for Shingles Vaccine? Yes   Zostavax completed Yes   Shingrix Completed?: No.    Education has been provided regarding the importance of this vaccine. Patient has been advised to call insurance company to determine out of pocket expense if they have not yet received this vaccine. Advised may also receive vaccine at local pharmacy or Health Dept. Verbalized acceptance and understanding.  Screening Tests Health Maintenance  Topic Date Due  . FOOT EXAM  Never done  . OPHTHALMOLOGY EXAM  02/03/2021  . COLONOSCOPY (Pts 45-56yr Insurance coverage will need to be confirmed)  02/13/2021  . COVID-19 Vaccine (4 - Booster for Pfizer series) 05/18/2021  . HEMOGLOBIN A1C  06/21/2021  . TETANUS/TDAP  08/02/2028  . INFLUENZA VACCINE  Completed  . Hepatitis C Screening  Completed  . PNA vac Low Risk Adult  Completed  . HPV VACCINES  Aged Out    Health Maintenance  Health Maintenance Due  Topic Date Due  . FOOT EXAM  Never done  . OPHTHALMOLOGY EXAM  02/03/2021  . COLONOSCOPY (Pts 45-476yrInsurance coverage will need to be confirmed)  02/13/2021    Colorectal cancer screening: Type  of screening: Colonoscopy. Currently due, pt plans to call and schedule apt.  Lung Cancer Screening: (Low Dose CT Chest recommended if Age 47-80 years, 30 pack-year currently smoking OR have quit w/in 15years.) does not qualify.   Additional Screening:  Hepatitis C Screening: Up to  date  Vision Screening: Recommended annual ophthalmology exams for early detection of glaucoma and other disorders of the eye. Is the patient up to date with their annual eye exam?  Yes  Who is the provider or what is the name of the office in which the patient attends annual eye exams? Dr Edison Pace @ [facility] If pt is not established with a provider, would they like to be referred to a provider to establish care? No .   Dental Screening: Recommended annual dental exams for proper oral hygiene  Community Resource Referral / Chronic Care Management: CRR required this visit?  No   CCM required this visit?  No      Plan:     I have personally reviewed and noted the following in the patient's chart:   . Medical and social history . Use of alcohol, tobacco or illicit drugs  . Current medications and supplements . Functional ability and status . Nutritional status . Physical activity . Advanced directives . List of other physicians . Hospitalizations, surgeries, and ER visits in previous 12 months . Vitals . Screenings to include cognitive, depression, and falls . Referrals and appointments  In addition, I have reviewed and discussed with patient certain preventive protocols, quality metrics, and best practice recommendations. A written personalized care plan for preventive services as well as general preventive health recommendations were provided to patient.     Edward King, [place]   7/50/5183   Nurse Notes: Pt needs a diabetic foot exam at next in office apt. Pt has an eye exam scheduled for 07/2021. Requested previous eye exam notes from 11/2020.

## 2021-03-14 ENCOUNTER — Other Ambulatory Visit: Payer: Self-pay

## 2021-03-14 ENCOUNTER — Ambulatory Visit (INDEPENDENT_AMBULATORY_CARE_PROVIDER_SITE_OTHER): Payer: PPO

## 2021-03-14 DIAGNOSIS — Z1211 Encounter for screening for malignant neoplasm of colon: Secondary | ICD-10-CM

## 2021-03-14 DIAGNOSIS — Z800 Family history of malignant neoplasm of digestive organs: Secondary | ICD-10-CM

## 2021-03-14 DIAGNOSIS — Z0000 Encounter for general adult medical examination without abnormal findings: Secondary | ICD-10-CM

## 2021-03-14 NOTE — Patient Instructions (Addendum)
Edward King , Thank you for taking time to come for your Medicare Wellness Visit. I appreciate your ongoing commitment to your health goals. Please review the following plan we discussed and let me know if I can assist you in the future.   Screening recommendations/referrals: Colonoscopy: Ordered today. Pt aware office will contact him to schedule apt.  Recommended yearly ophthalmology/optometry visit for glaucoma screening and checkup Recommended yearly dental visit for hygiene and checkup  Vaccinations: Influenza vaccine: Done 08/30/21 Pneumococcal vaccine: Completed series Tdap vaccine: Up to date, due 07/2028 Shingles vaccine: Shingrix discussed. Please contact your pharmacy for coverage information.     Advanced directives: Advance directive discussed with you today. Even though you declined this today please call our office should you change your mind and we can give you the proper paperwork for you to fill out.  Conditions/risks identified: Obesity; recommend to start brisk walking for 3 days a week for 30 minutes at a time.  Next appointment: 04/22/21 @ 1:40 PM with Dr Caryn Section. Declined scheduling an AWV for 2023 at this time.   Preventive Care 5 Years and Older, Male Preventive care refers to lifestyle choices and visits with your health care provider that can promote health and wellness. What does preventive care include?  A yearly physical exam. This is also called an annual well check.  Dental exams once or twice a year.  Routine eye exams. Ask your health care provider how often you should have your eyes checked.  Personal lifestyle choices, including:  Daily care of your teeth and gums.  Regular physical activity.  Eating a healthy diet.  Avoiding tobacco and drug use.  Limiting alcohol use.  Practicing safe sex.  Taking low doses of aspirin every day.  Taking vitamin and mineral supplements as recommended by your health care provider. What happens during an  annual well check? The services and screenings done by your health care provider during your annual well check will depend on your age, overall health, lifestyle risk factors, and family history of disease. Counseling  Your health care provider may ask you questions about your:  Alcohol use.  Tobacco use.  Drug use.  Emotional well-being.  Home and relationship well-being.  Sexual activity.  Eating habits.  History of falls.  Memory and ability to understand (cognition).  Work and work Statistician. Screening  You may have the following tests or measurements:  Height, weight, and BMI.  Blood pressure.  Lipid and cholesterol levels. These may be checked every 5 years, or more frequently if you are over 55 years old.  Skin check.  Lung cancer screening. You may have this screening every year starting at age 63 if you have a 30-pack-year history of smoking and currently smoke or have quit within the past 15 years.  Fecal occult blood test (FOBT) of the stool. You may have this test every year starting at age 84.  Flexible sigmoidoscopy or colonoscopy. You may have a sigmoidoscopy every 5 years or a colonoscopy every 10 years starting at age 87.  Prostate cancer screening. Recommendations will vary depending on your family history and other risks.  Hepatitis C blood test.  Hepatitis B blood test.  Sexually transmitted disease (STD) testing.  Diabetes screening. This is done by checking your blood sugar (glucose) after you have not eaten for a while (fasting). You may have this done every 1-3 years.  Abdominal aortic aneurysm (AAA) screening. You may need this if you are a current or former smoker.  Osteoporosis. You may be screened starting at age 45 if you are at high risk. Talk with your health care provider about your test results, treatment options, and if necessary, the need for more tests. Vaccines  Your health care provider may recommend certain vaccines,  such as:  Influenza vaccine. This is recommended every year.  Tetanus, diphtheria, and acellular pertussis (Tdap, Td) vaccine. You may need a Td booster every 10 years.  Zoster vaccine. You may need this after age 35.  Pneumococcal 13-valent conjugate (PCV13) vaccine. One dose is recommended after age 58.  Pneumococcal polysaccharide (PPSV23) vaccine. One dose is recommended after age 65. Talk to your health care provider about which screenings and vaccines you need and how often you need them. This information is not intended to replace advice given to you by your health care provider. Make sure you discuss any questions you have with your health care provider. Document Released: 12/31/2015 Document Revised: 08/23/2016 Document Reviewed: 10/05/2015 Elsevier Interactive Patient Education  2017 [address] Prevention in the Home Falls can cause injuries. They can happen to people of all ages. There are many things you can do to make your home safe and to help prevent falls. What can I do on the outside of my home?  Regularly fix the edges of walkways and driveways and fix any cracks.  Remove anything that might make you trip as you walk through a door, such as a raised step or threshold.  Trim any bushes or trees on the path to your home.  Use bright outdoor lighting.  Clear any walking paths of anything that might make someone trip, such as rocks or tools.  Regularly check to see if handrails are loose or broken. Make sure that both sides of any steps have handrails.  Any raised decks and porches should have guardrails on the edges.  Have any leaves, snow, or ice cleared regularly.  Use sand or salt on walking paths during winter.  Clean up any spills in your garage right away. This includes oil or grease spills. What can I do in the bathroom?  Use night lights.  Install grab bars by the toilet and in the tub and shower. Do not use towel bars as grab bars.  Use  non-skid mats or decals in the tub or shower.  If you need to sit down in the shower, use a plastic, non-slip stool.  Keep the floor dry. Clean up any water that spills on the floor as soon as it happens.  Remove soap buildup in the tub or shower regularly.  Attach bath mats securely with double-sided non-slip rug tape.  Do not have throw rugs and other things on the floor that can make you trip. What can I do in the bedroom?  Use night lights.  Make sure that you have a light by your bed that is easy to reach.  Do not use any sheets or blankets that are too big for your bed. They should not hang down onto the floor.  Have a firm chair that has side arms. You can use this for support while you get dressed.  Do not have throw rugs and other things on the floor that can make you trip. What can I do in the kitchen?  Clean up any spills right away.  Avoid walking on wet floors.  Keep items that you use a lot in easy-to-reach places.  If you need to reach something above you, use a strong step  stool that has a grab bar.  Keep electrical cords out of the way.  Do not use floor polish or wax that makes floors slippery. If you must use wax, use non-skid floor wax.  Do not have throw rugs and other things on the floor that can make you trip. What can I do with my stairs?  Do not leave any items on the stairs.  Make sure that there are handrails on both sides of the stairs and use them. Fix handrails that are broken or loose. Make sure that handrails are as long as the stairways.  Check any carpeting to make sure that it is firmly attached to the stairs. Fix any carpet that is loose or worn.  Avoid having throw rugs at the top or bottom of the stairs. If you do have throw rugs, attach them to the floor with carpet tape.  Make sure that you have a light switch at the top of the stairs and the bottom of the stairs. If you do not have them, ask someone to add them for you. What  else can I do to help prevent falls?  Wear shoes that:  Do not have high heels.  Have rubber bottoms.  Are comfortable and fit you well.  Are closed at the toe. Do not wear sandals.  If you use a stepladder:  Make sure that it is fully opened. Do not climb a closed stepladder.  Make sure that both sides of the stepladder are locked into place.  Ask someone to hold it for you, if possible.  Clearly mark and make sure that you can see:  Any grab bars or handrails.  First and last steps.  Where the edge of each step is.  Use tools that help you move around (mobility aids) if they are needed. These include:  Canes.  Walkers.  Scooters.  Crutches.  Turn on the lights when you go into a dark area. Replace any light bulbs as soon as they burn out.  Set up your furniture so you have a clear path. Avoid moving your furniture around.  If any of your floors are uneven, fix them.  If there are any pets around you, be aware of where they are.  Review your medicines with your doctor. Some medicines can make you feel dizzy. This can increase your chance of falling. Ask your doctor what other things that you can do to help prevent falls. This information is not intended to replace advice given to you by your health care provider. Make sure you discuss any questions you have with your health care provider. Document Released: 09/30/2009 Document Revised: 05/11/2016 Document Reviewed: 01/08/2015 Elsevier Interactive Patient Education  2017 [employer].

## 2021-04-15 DIAGNOSIS — F330 Major depressive disorder, recurrent, mild: Secondary | ICD-10-CM | POA: Diagnosis not present

## 2021-04-22 ENCOUNTER — Encounter: Payer: Self-pay | Admitting: Family Medicine

## 2021-04-22 ENCOUNTER — Encounter (INDEPENDENT_AMBULATORY_CARE_PROVIDER_SITE_OTHER): Payer: Self-pay

## 2021-04-22 ENCOUNTER — Ambulatory Visit (INDEPENDENT_AMBULATORY_CARE_PROVIDER_SITE_OTHER): Payer: PPO | Admitting: Family Medicine

## 2021-04-22 ENCOUNTER — Other Ambulatory Visit: Payer: Self-pay

## 2021-04-22 VITALS — BP 131/82 | HR 73 | Temp 97.6°F | Resp 18 | Wt 301.0 lb

## 2021-04-22 DIAGNOSIS — E1149 Type 2 diabetes mellitus with other diabetic neurological complication: Secondary | ICD-10-CM

## 2021-04-22 LAB — POCT GLYCOSYLATED HEMOGLOBIN (HGB A1C)
Est. average glucose Bld gHb Est-mCnc: 137
Hemoglobin A1C: 6.4 % — AB (ref 4.0–5.6)

## 2021-04-22 MED ORDER — METFORMIN HCL ER 500 MG PO TB24: 1000.0000 mg | ORAL_TABLET | Freq: Every day | ORAL | Status: DC

## 2021-04-22 MED ORDER — TRULICITY 4.5 MG/0.5ML ~~LOC~~ SOAJ: 4.5000 mg | SUBCUTANEOUS | 5 refills | Status: DC

## 2021-04-22 NOTE — Progress Notes (Signed)
Established patient visit   Patient: Edward King   DOB: 1951/09/18   70 y.o. Male  MRN: [id number] Visit Date: 04/22/2021  Today's healthcare provider: Lelon Huh, MD   Chief Complaint  Patient presents with  . Diabetes  . Hypertension   Subjective    HPI  Diabetes Mellitus Type II, Follow-up  Lab Results  Component Value Date   HGBA1C 6.4 (A) 04/22/2021   HGBA1C 6.7 (A) 12/22/2020   HGBA1C 6.7 (A) 08/17/2020   Wt Readings from Last 3 Encounters:  04/22/21 (!) 301 lb (136.5 kg)  12/22/20 (!) 302 lb (137 kg)  08/30/20 (!) 309 lb (140.2 kg)   Last seen for diabetes 4 months ago.  Management since then includes continue same medications. He decline increasint Truliticy at that time.  He reports fair compliance with treatment. He has been taking Metformin 2 tablets daily, instead of 2 tablets twice daily.  He is not having side effects.  Symptoms: No fatigue No foot ulcerations  No appetite changes No nausea  No paresthesia of the feet  No polydipsia  No polyuria No visual disturbances   No vomiting     Home blood sugar records: fasting range: 120  Episodes of hypoglycemia? No    Current insulin regiment: none Most Recent Eye Exam: not UTD Current exercise: walking Current diet habits: in general, an "unhealthy" diet  Pertinent Labs: Lab Results  Component Value Date   CHOL 138 09/09/2019   HDL 63 09/09/2019   LDLCALC 56 09/09/2019   TRIG 104 09/09/2019   CHOLHDL 2.2 09/09/2019   Lab Results  Component Value Date   NA 140 08/17/2020   K 4.5 08/17/2020   CREATININE 1.04 08/17/2020   GFRNONAA 73 08/17/2020   GFRAA 84 08/17/2020   GLUCOSE 104 (H) 08/17/2020     ---------------------------------------------------------------------------------------------------  Hypertension, follow-up  BP Readings from Last 3 Encounters:  04/22/21 131/82  12/22/20 126/86  11/26/20 109/67   Wt Readings from Last 3 Encounters:  04/22/21 (!) 301 lb (136.5  kg)  12/22/20 (!) 302 lb (137 kg)  08/30/20 (!) 309 lb (140.2 kg)     He was last seen for hypertension 4 months ago.  BP at that visit was 126/86. Management since that visit includes continue same medication.  He reports good compliance with treatment. He is not having side effects.  He is following a Regular diet. He is not exercising. He does not smoke.  Use of agents associated with hypertension: none.   Outside blood pressures are not checked. Symptoms: No chest pain No chest pressure  No palpitations No syncope  No dyspnea No orthopnea  No paroxysmal nocturnal dyspnea Yes lower extremity edema   Pertinent labs: Lab Results  Component Value Date   CHOL 138 09/09/2019   HDL 63 09/09/2019   LDLCALC 56 09/09/2019   TRIG 104 09/09/2019   CHOLHDL 2.2 09/09/2019   Lab Results  Component Value Date   NA 140 08/17/2020   K 4.5 08/17/2020   CREATININE 1.04 08/17/2020   GFRNONAA 73 08/17/2020   GFRAA 84 08/17/2020   GLUCOSE 104 (H) 08/17/2020     The 10-year ASCVD risk score Mikey Bussing DC Jr., et al., 2013) is: 29.3%   ---------------------------------------------------------------------------------------------------     Medications: Outpatient Medications Prior to Visit  Medication Sig  . buPROPion (WELLBUTRIN SR) 150 MG 12 hr tablet Take 150 mg by mouth daily.  . citalopram (CELEXA) 40 MG tablet Take 40 mg by mouth  daily.   . Coenzyme Q10 (COQ-10) 100 MG CAPS Take 2,000 mg by mouth daily.  . Cyanocobalamin (VITAMIN B-12 PO) Take 1,000 mcg by mouth daily at 6 (six) AM.  . gabapentin (NEURONTIN) 300 MG capsule TAKE 2 CAPSULES BY MOUTH IN THE MORNING AND 3 CAPSULES AT BEDTIME  . glucose blood (CONTOUR NEXT TEST) test strip Use as instructed to check blood sugar daily  . meloxicam (MOBIC) 15 MG tablet Take 15 mg by mouth daily.  . mesalamine (APRISO) 0.375 g 24 hr capsule Take 375 mg by mouth daily.  . metFORMIN (GLUCOPHAGE-XR) 500 MG 24 hr tablet Take 2 tablets (1,000  mg total) by mouth 2 (two) times daily. (Patient taking differently: Take 1,000 mg by mouth daily.)  . mupirocin nasal ointment (BACTROBAN NASAL) 2 % Place 1 application into the nose 2 (two) times daily. Use one-half of tube in each nostril twice daily for five (5) days. After application, press sides of nose together and gently massage.  . pravastatin (PRAVACHOL) 40 MG tablet TAKE 1 TABLET (40 MG TOTAL) BY MOUTH DAILY WITH LUNCH.  . TRULICITY 3 LK/4.4WN SOPN INJECT 3 MG AS DIRECTED ONCE A WEEK.  . valsartan-hydrochlorothiazide (DIOVAN-HCT) 80-12.5 MG tablet TAKE 1 TABLET BY MOUTH EVERY DAY  . Vitamin D, Cholecalciferol, 1000 units TABS Take 2,000 Units by mouth daily.   No facility-administered medications prior to visit.    Review of Systems  Constitutional: Negative for appetite change, chills and fever.  Respiratory: Negative for chest tightness, shortness of breath and wheezing.   Cardiovascular: Positive for leg swelling. Negative for chest pain and palpitations.  Gastrointestinal: Negative for abdominal pain, nausea and vomiting.       Objective    BP 131/82 (BP Location: Left Arm, Patient Position: Sitting, Cuff Size: Large)   Pulse 73   Temp 97.6 F (36.4 C) (Temporal)   Resp 18   Wt (!) 301 lb (136.5 kg)   BMI 43.19 kg/m     Physical Exam   General appearance: Severely obese male, cooperative and in no acute distress Head: Normocephalic, without obvious abnormality, atraumatic Respiratory: Respirations even and unlabored, normal respiratory rate Extremities: All extremities are intact.  Skin: Skin color, texture, turgor normal. No rashes seen  Psych: Appropriate mood and affect. Neurologic: Mental status: Alert, oriented to person, place, and time, thought content appropriate.   Results for orders placed or performed in visit on 04/22/21  POCT HgB A1C  Result Value Ref Range   Hemoglobin A1C 6.4 (A) 4.0 - 5.6 %   Est. average glucose Bld gHb Est-mCnc 137      Assessment & Plan     1. Type 2 diabetes mellitus with other neurologic complication, without long-term current use of insulin (HCC) Doing well current meds. Can continue metformin 1075m daily and increase Dulaglutide (TRULICITY) to  4.5 MUU/[id number]; Inject 4.5 mg as directed once a week.  Dispense: 2 mL; Refill: 5  2.  Morbid obesity Continue to work on diet. May seem some additional weight loss with increased dose of Trulicity   Future Appointments  Date Time Provider DB[place]fontaine Neighbors 08/26/2021  1:40 PM FCaryn Section DKirstie Peri MD [facility]           The entirety of the information documented in the History of Present Illness, Review of Systems and Physical Exam were personally obtained by me. Portions of this information were initially documented by the CMA and reviewed by me for thoroughness and accuracy.  Lelon Huh, MD  [facility] [phone] (phone) [phone] (fax)  [place]

## 2021-05-09 DIAGNOSIS — Z800 Family history of malignant neoplasm of digestive organs: Secondary | ICD-10-CM | POA: Diagnosis not present

## 2021-05-09 DIAGNOSIS — K5010 Crohn's disease of large intestine without complications: Secondary | ICD-10-CM | POA: Diagnosis not present

## 2021-05-09 DIAGNOSIS — R142 Eructation: Secondary | ICD-10-CM | POA: Diagnosis not present

## 2021-05-16 DIAGNOSIS — F330 Major depressive disorder, recurrent, mild: Secondary | ICD-10-CM | POA: Diagnosis not present

## 2021-06-08 ENCOUNTER — Other Ambulatory Visit
Admission: RE | Admit: 2021-06-08 | Discharge: 2021-06-08 | Disposition: A | Discharge status: Home / Self Care | Payer: PPO | Attending: Ophthalmology | Admitting: Ophthalmology

## 2021-06-08 DIAGNOSIS — H353231 Exudative age-related macular degeneration, bilateral, with active choroidal neovascularization: Secondary | ICD-10-CM | POA: Diagnosis not present

## 2021-06-08 DIAGNOSIS — M316 Other giant cell arteritis: Secondary | ICD-10-CM | POA: Insufficient documentation

## 2021-06-08 LAB — PLATELET COUNT: Platelets: 238 10*3/uL (ref 150–400)

## 2021-06-08 LAB — SEDIMENTATION RATE: Sed Rate: 13 mm/hr (ref 0–20)

## 2021-06-08 LAB — HM DIABETES EYE EXAM

## 2021-06-08 LAB — C-REACTIVE PROTEIN: CRP: 0.9 mg/dL (ref <1.0)

## 2021-06-13 DIAGNOSIS — H34232 Retinal artery branch occlusion, left eye: Secondary | ICD-10-CM | POA: Diagnosis not present

## 2021-06-23 ENCOUNTER — Other Ambulatory Visit: Payer: Self-pay | Admitting: Family Medicine

## 2021-06-23 NOTE — Telephone Encounter (Signed)
Requested medication (s) are due for refill today - expired Rx  Requested medication (s) are on the active medication list -yes  Future visit scheduled -yes  Last refill: 05/14/20  Notes to clinic: Rx has expired- sent for review   Requested Prescriptions  Pending Prescriptions Disp Refills   gabapentin (NEURONTIN) 300 MG capsule [Pharmacy Med Name: GABAPENTIN 300 MG CAPSULE] 450 capsule 12    Sig: TAKE 2 CAPSULES BY MOUTH IN THE MORNING AND 3 CAPSULES AT BEDTIME      Neurology: Anticonvulsants - gabapentin Passed - 06/23/2021  3:56 PM      Passed - Valid encounter within last 12 months    Recent Outpatient Visits           2 months ago Type 2 diabetes mellitus with other neurologic complication, without long-term current use of insulin ([place])   [facility] Birdie Sons, MD   6 months ago Type 2 diabetes mellitus with other neurologic complication, without long-term current use of insulin ([place])   [facility] Birdie Sons, MD   7 months ago Cough   [facility] Birdie Sons, MD   9 months ago Need for influenza vaccination   [facility] Birdie Sons, MD   10 months ago Type 2 diabetes mellitus with other neurologic complication, without long-term current use of insulin [facility])   Angie, Kirstie Peri, MD       Future Appointments             In 2 months Fisher, Kirstie Peri, MD [facility] [place]                 Requested Prescriptions  Pending Prescriptions Disp Refills   gabapentin (NEURONTIN) 300 MG capsule [Pharmacy Med Name: GABAPENTIN 300 MG CAPSULE] 450 capsule 12    Sig: TAKE 2 CAPSULES BY MOUTH IN THE MORNING AND 3 CAPSULES AT BEDTIME      Neurology: Anticonvulsants - gabapentin Passed - 06/23/2021  3:56 PM      Passed - Valid encounter within last 12 months    Recent Outpatient Visits           2 months ago Type 2 diabetes mellitus with other neurologic  complication, without long-term current use of insulin ([place])   [facility] Birdie Sons, MD   6 months ago Type 2 diabetes mellitus with other neurologic complication, without long-term current use of insulin [facility])   [facility] Birdie Sons, MD   7 months ago Cough   [facility] Birdie Sons, MD   9 months ago Need for influenza vaccination   [facility] Birdie Sons, MD   10 months ago Type 2 diabetes mellitus with other neurologic complication, without long-term current use of insulin [facility])   [facility] Birdie Sons, MD       Future Appointments             In 2 months Fisher, Kirstie Peri, MD [facility], [place]

## 2021-06-25 ENCOUNTER — Other Ambulatory Visit
Admission: RE | Admit: 2021-06-25 | Discharge: 2021-06-25 | Disposition: A | Discharge status: Home / Self Care | Payer: PPO | Source: Ambulatory Visit | Attending: Ophthalmology | Admitting: Ophthalmology

## 2021-06-25 ENCOUNTER — Other Ambulatory Visit: Payer: Self-pay | Admitting: Ophthalmology

## 2021-06-25 ENCOUNTER — Encounter: Payer: Self-pay | Admitting: Ophthalmology

## 2021-06-25 DIAGNOSIS — M316 Other giant cell arteritis: Secondary | ICD-10-CM | POA: Insufficient documentation

## 2021-06-25 LAB — PLATELET COUNT: Platelets: 233 10*3/uL (ref 150–400)

## 2021-06-25 LAB — SEDIMENTATION RATE: Sed Rate: 9 mm/hr (ref 0–20)

## 2021-06-25 NOTE — Progress Notes (Signed)
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  Will start on 60 mg prednisone by mouth daily and get labs.

## 2021-06-26 LAB — C-REACTIVE PROTEIN: CRP: 0.6 mg/dL (ref <1.0)

## 2021-06-27 DIAGNOSIS — F330 Major depressive disorder, recurrent, mild: Secondary | ICD-10-CM | POA: Diagnosis not present

## 2021-06-28 DIAGNOSIS — M316 Other giant cell arteritis: Secondary | ICD-10-CM | POA: Diagnosis not present

## 2021-07-04 DIAGNOSIS — E119 Type 2 diabetes mellitus without complications: Secondary | ICD-10-CM | POA: Diagnosis not present

## 2021-07-04 DIAGNOSIS — M316 Other giant cell arteritis: Secondary | ICD-10-CM | POA: Diagnosis not present

## 2021-07-13 DIAGNOSIS — F330 Major depressive disorder, recurrent, mild: Secondary | ICD-10-CM | POA: Diagnosis not present

## 2021-08-01 DIAGNOSIS — M5416 Radiculopathy, lumbar region: Secondary | ICD-10-CM | POA: Diagnosis not present

## 2021-08-01 DIAGNOSIS — M179 Osteoarthritis of knee, unspecified: Secondary | ICD-10-CM | POA: Diagnosis not present

## 2021-08-01 DIAGNOSIS — M545 Low back pain, unspecified: Secondary | ICD-10-CM | POA: Diagnosis not present

## 2021-08-10 DIAGNOSIS — M5416 Radiculopathy, lumbar region: Secondary | ICD-10-CM | POA: Diagnosis not present

## 2021-08-10 DIAGNOSIS — E119 Type 2 diabetes mellitus without complications: Secondary | ICD-10-CM | POA: Diagnosis not present

## 2021-08-11 DIAGNOSIS — M316 Other giant cell arteritis: Secondary | ICD-10-CM | POA: Diagnosis not present

## 2021-08-11 DIAGNOSIS — H34232 Retinal artery branch occlusion, left eye: Secondary | ICD-10-CM | POA: Diagnosis not present

## 2021-08-16 ENCOUNTER — Other Ambulatory Visit: Payer: Self-pay | Admitting: Family Medicine

## 2021-08-16 DIAGNOSIS — H35713 Central serous chorioretinopathy, bilateral: Secondary | ICD-10-CM | POA: Diagnosis not present

## 2021-08-16 NOTE — Telephone Encounter (Signed)
Requested medications are due for refill today.  yes  Requested medications are on the active medications list.  yes  Last refill. 02/16/2021  Future visit scheduled.   yes  Notes to clinic.  Patient is overdue for lab work.

## 2021-08-17 DIAGNOSIS — F331 Major depressive disorder, recurrent, moderate: Secondary | ICD-10-CM | POA: Diagnosis not present

## 2021-08-17 NOTE — Telephone Encounter (Signed)
Pt is calling checking on the status of bp medication valsartan-hctz

## 2021-08-19 DIAGNOSIS — M545 Low back pain, unspecified: Secondary | ICD-10-CM | POA: Diagnosis not present

## 2021-08-26 ENCOUNTER — Other Ambulatory Visit: Payer: Self-pay

## 2021-08-26 ENCOUNTER — Ambulatory Visit (INDEPENDENT_AMBULATORY_CARE_PROVIDER_SITE_OTHER): Payer: PPO | Admitting: Family Medicine

## 2021-08-26 ENCOUNTER — Encounter: Payer: Self-pay | Admitting: Family Medicine

## 2021-08-26 VITALS — BP 112/70 | HR 76 | Temp 98.6°F | Resp 18 | Wt 304.0 lb

## 2021-08-26 DIAGNOSIS — Z289 Immunization not carried out for unspecified reason: Secondary | ICD-10-CM | POA: Diagnosis not present

## 2021-08-26 DIAGNOSIS — E1149 Type 2 diabetes mellitus with other diabetic neurological complication: Secondary | ICD-10-CM | POA: Diagnosis not present

## 2021-08-26 DIAGNOSIS — I10 Essential (primary) hypertension: Secondary | ICD-10-CM | POA: Diagnosis not present

## 2021-08-26 DIAGNOSIS — Z125 Encounter for screening for malignant neoplasm of prostate: Secondary | ICD-10-CM

## 2021-08-26 DIAGNOSIS — Z23 Encounter for immunization: Secondary | ICD-10-CM

## 2021-08-26 LAB — POCT GLYCOSYLATED HEMOGLOBIN (HGB A1C)
Est. average glucose Bld gHb Est-mCnc: 146
Hemoglobin A1C: 6.7 % — AB (ref 4.0–5.6)

## 2021-08-26 MED ORDER — SHINGRIX 50 MCG/0.5ML IM SUSR: 0.5000 mL | Freq: Once | INTRAMUSCULAR | 0 refills | Status: AC

## 2021-08-26 NOTE — Progress Notes (Signed)
Established patient visit   Patient: Edward King   DOB: 24-Apr-1951   70 y.o. Male  MRN: [id number] Visit Date: 08/26/2021  Today's healthcare provider: Lelon Huh, MD   Chief Complaint  Patient presents with   Diabetes   Subjective    HPI  Diabetes Mellitus Type II, Follow-up  Lab Results  Component Value Date   HGBA1C 6.4 (A) 04/22/2021   HGBA1C 6.7 (A) 12/22/2020   HGBA1C 6.7 (A) 08/17/2020   Wt Readings from Last 3 Encounters:  08/26/21 (!) 304 lb (137.9 kg)  04/22/21 (!) 301 lb (136.5 kg)  12/22/20 (!) 302 lb (137 kg)   Last seen for diabetes 4 months ago.  Management since then includes advising patient to continue metformin 1035m daily and increase Dulaglutide (TRULICITY) to  4.5 MBS/4.9QPSOPN; Inject 4.5 mg as directed once a week. He reports good compliance with treatment. He is not having side effects.  Symptoms: No fatigue No foot ulcerations  No appetite changes No nausea  No paresthesia of the feet  No polydipsia  No polyuria No visual disturbances   No vomiting     Home blood sugar records: fasting range: 120-140  Episodes of hypoglycemia? No    Current insulin regiment: none Most Recent Eye Exam: 06/08/2021 Current exercise: none Current diet habits: in general, an "unhealthy" diet  Pertinent Labs: Lab Results  Component Value Date   CHOL 138 09/09/2019   HDL 63 09/09/2019   LDLCALC 56 09/09/2019   TRIG 104 09/09/2019   CHOLHDL 2.2 09/09/2019   Lab Results  Component Value Date   NA 140 08/17/2020   K 4.5 08/17/2020   CREATININE 1.04 08/17/2020   GFRNONAA 73 08/17/2020   GFRAA 84 08/17/2020   GLUCOSE 104 (H) 08/17/2020     ---------------------------------------------------------------------------------------------------     Medications: Outpatient Medications Prior to Visit  Medication Sig   buPROPion (WELLBUTRIN SR) 150 MG 12 hr tablet Take 150 mg by mouth daily.   citalopram (CELEXA) 40 MG tablet Take 40 mg by  mouth daily.    Coenzyme Q10 (COQ-10) 100 MG CAPS Take 2,000 mg by mouth daily.   Cyanocobalamin (VITAMIN B-12 PO) Take 1,000 mcg by mouth daily at 6 (six) AM.   Dulaglutide (TRULICITY) 4.5 MRF/1.6BWSOPN Inject 4.5 mg as directed once a week.   gabapentin (NEURONTIN) 300 MG capsule TAKE 2 CAPSULES BY MOUTH IN THE MORNING AND 3 CAPSULES AT BEDTIME   glucose blood (CONTOUR NEXT TEST) test strip Use as instructed to check blood sugar daily   meloxicam (MOBIC) 15 MG tablet Take 15 mg by mouth daily.   mesalamine (APRISO) 0.375 g 24 hr capsule Take 375 mg by mouth daily.   metFORMIN (GLUCOPHAGE-XR) 500 MG 24 hr tablet Take 2 tablets (1,000 mg total) by mouth daily.   mupirocin nasal ointment (BACTROBAN NASAL) 2 % Place 1 application into the nose 2 (two) times daily. Use one-half of tube in each nostril twice daily for five (5) days. After application, press sides of nose together and gently massage.   pravastatin (PRAVACHOL) 40 MG tablet TAKE 1 TABLET (40 MG TOTAL) BY MOUTH DAILY WITH LUNCH.   valsartan-hydrochlorothiazide (DIOVAN-HCT) 80-12.5 MG tablet TAKE 1 TABLET BY MOUTH EVERY DAY   Vitamin D, Cholecalciferol, 1000 units TABS Take 2,000 Units by mouth daily.   No facility-administered medications prior to visit.    Review of Systems  Constitutional:  Negative for appetite change, chills and fever.  Respiratory:  Negative  for chest tightness, shortness of breath and wheezing.   Cardiovascular:  Negative for chest pain and palpitations.  Gastrointestinal:  Negative for abdominal pain, nausea and vomiting.      Objective    BP 112/70 (BP Location: Right Arm, Patient Position: Sitting, Cuff Size: Large)   Pulse 76   Temp 98.6 F (37 C) (Oral)   Resp 18   Wt (!) 304 lb (137.9 kg)   SpO2 97%   BMI 43.62 kg/m  {Show previous vital signs (optional):23777}  Physical Exam   General: Appearance:    Severely obese male in no acute distress  Eyes:    PERRL, conjunctiva/corneas clear,  EOM's intact       Lungs:     Clear to auscultation bilaterally, respirations unlabored  Heart:    Normal heart rate. Normal rhythm. No murmurs, rubs, or gallops.    MS:   All extremities are intact.    Neurologic:   Awake, alert, oriented x 3. No apparent focal neurological defect.         Results for orders placed or performed in visit on 08/26/21  POCT HgB A1C  Result Value Ref Range   Hemoglobin A1C 6.7 (A) 4.0 - 5.6 %   Est. average glucose Bld gHb Est-mCnc 146     Assessment & Plan     1. Type 2 diabetes mellitus with other neurologic complication, without long-term current use of insulin (HCC) Well controlled.  Continue current medications.   - CBC - Comprehensive metabolic panel - Lipid panel  2. Hypertension, unspecified type Well controlled.  Continue current medications.    3. Prostate cancer screening  - PSA Total (Reflex To Free) ([facility] only)  4. Need for influenza vaccination  - Flu Vaccine QUAD High Dose IM (Fluad)  5. Prescription for Shingrix. Vaccine not administered in office.   - Zoster Vaccine Adjuvanted [facility]) injection; Inject 0.5 mLs into the muscle once for 1 dose. Repeat in 2 months  Dispense: 0.5 mL  Future Appointments  Date Time Provider [place]  03/17/2022  2:00 PM Caryn Section Kirstie Peri, MD [facility]        The entirety of the information documented in the History of Present Illness, Review of Systems and Physical Exam were personally obtained by me. Portions of this information were initially documented by the CMA and reviewed by me for thoroughness and accuracy.     Lelon Huh, MD  [facility] [phone] (phone) [phone] (fax)  [place]

## 2021-08-26 NOTE — Patient Instructions (Addendum)
Please review the attached list of medications and notify my office if there are any errors.   Please bring all of your medications to every appointment so we can make sure that our medication list is the same as yours.   I strongly recommend a Covid Omicron booster if it's been more than 2 months since your last covid booster or infection.

## 2021-08-27 LAB — LIPID PANEL
Chol/HDL Ratio: 2.3 ratio (ref 0.0–5.0)
Cholesterol, Total: 146 mg/dL (ref 100–199)
HDL: 64 mg/dL (ref >39)
LDL Chol Calc (NIH): 68 mg/dL (ref 0–99)
Triglycerides: 69 mg/dL (ref 0–149)
VLDL Cholesterol Cal: 14 mg/dL (ref 5–40)

## 2021-08-27 LAB — COMPREHENSIVE METABOLIC PANEL
ALT: 15 IU/L (ref 0–44)
AST: 15 IU/L (ref 0–40)
Albumin/Globulin Ratio: 1.6 (ref 1.2–2.2)
Albumin: 4 g/dL (ref 3.8–4.8)
Alkaline Phosphatase: 102 IU/L (ref 44–121)
BUN/Creatinine Ratio: 17 (ref 10–24)
BUN: 18 mg/dL (ref 8–27)
Bilirubin Total: 0.4 mg/dL (ref 0.0–1.2)
CO2: 28 mmol/L (ref 20–29)
Calcium: 9.5 mg/dL (ref 8.6–10.2)
Chloride: 98 mmol/L (ref 96–106)
Creatinine, Ser: 1.04 mg/dL (ref 0.76–1.27)
Globulin, Total: 2.5 g/dL (ref 1.5–4.5)
Glucose: 118 mg/dL — ABNORMAL HIGH (ref 65–99)
Potassium: 5 mmol/L (ref 3.5–5.2)
Sodium: 138 mmol/L (ref 134–144)
Total Protein: 6.5 g/dL (ref 6.0–8.5)
eGFR: 77 mL/min/{1.73_m2} (ref >59)

## 2021-08-27 LAB — CBC
Hematocrit: 43 % (ref 37.5–51.0)
Hemoglobin: 14.2 g/dL (ref 13.0–17.7)
MCH: 27.5 pg (ref 26.6–33.0)
MCHC: 33 g/dL (ref 31.5–35.7)
MCV: 83 fL (ref 79–97)
Platelets: 248 10*3/uL (ref 150–450)
RBC: 5.16 x10E6/uL (ref 4.14–5.80)
RDW: 14.4 % (ref 11.6–15.4)
WBC: 9.6 10*3/uL (ref 3.4–10.8)

## 2021-08-27 LAB — PSA TOTAL (REFLEX TO FREE): Prostate Specific Ag, Serum: <0.1 ng/mL (ref 0.0–4.0)

## 2021-08-29 DIAGNOSIS — M545 Low back pain, unspecified: Secondary | ICD-10-CM | POA: Diagnosis not present

## 2021-09-01 DIAGNOSIS — F331 Major depressive disorder, recurrent, moderate: Secondary | ICD-10-CM | POA: Diagnosis not present

## 2021-09-02 DIAGNOSIS — F330 Major depressive disorder, recurrent, mild: Secondary | ICD-10-CM | POA: Diagnosis not present

## 2021-09-05 DIAGNOSIS — M545 Low back pain, unspecified: Secondary | ICD-10-CM | POA: Diagnosis not present

## 2021-09-08 DIAGNOSIS — E119 Type 2 diabetes mellitus without complications: Secondary | ICD-10-CM | POA: Diagnosis not present

## 2021-09-08 DIAGNOSIS — M316 Other giant cell arteritis: Secondary | ICD-10-CM | POA: Diagnosis not present

## 2021-09-12 DIAGNOSIS — M179 Osteoarthritis of knee, unspecified: Secondary | ICD-10-CM | POA: Diagnosis not present

## 2021-09-12 DIAGNOSIS — M47817 Spondylosis without myelopathy or radiculopathy, lumbosacral region: Secondary | ICD-10-CM | POA: Diagnosis not present

## 2021-09-12 DIAGNOSIS — M545 Low back pain, unspecified: Secondary | ICD-10-CM | POA: Diagnosis not present

## 2021-09-21 DIAGNOSIS — M545 Low back pain, unspecified: Secondary | ICD-10-CM | POA: Diagnosis not present

## 2021-09-26 DIAGNOSIS — M170 Bilateral primary osteoarthritis of knee: Secondary | ICD-10-CM | POA: Diagnosis not present

## 2021-10-05 DIAGNOSIS — M47817 Spondylosis without myelopathy or radiculopathy, lumbosacral region: Secondary | ICD-10-CM | POA: Diagnosis not present

## 2021-10-05 DIAGNOSIS — E119 Type 2 diabetes mellitus without complications: Secondary | ICD-10-CM | POA: Diagnosis not present

## 2021-10-19 ENCOUNTER — Ambulatory Visit: Admission: RE | Admit: 2021-10-19 | Payer: PPO | Source: Home / Self Care | Admitting: Internal Medicine

## 2021-10-19 ENCOUNTER — Encounter: Admission: RE | Payer: Self-pay | Source: Home / Self Care

## 2021-10-19 SURGERY — COLONOSCOPY WITH PROPOFOL
Anesthesia: General

## 2021-10-24 DIAGNOSIS — M47817 Spondylosis without myelopathy or radiculopathy, lumbosacral region: Secondary | ICD-10-CM | POA: Diagnosis not present

## 2021-10-24 DIAGNOSIS — R269 Unspecified abnormalities of gait and mobility: Secondary | ICD-10-CM | POA: Diagnosis not present

## 2021-10-24 DIAGNOSIS — M5416 Radiculopathy, lumbar region: Secondary | ICD-10-CM | POA: Diagnosis not present

## 2021-10-24 DIAGNOSIS — M791 Myalgia, unspecified site: Secondary | ICD-10-CM | POA: Diagnosis not present

## 2021-10-24 DIAGNOSIS — M545 Low back pain, unspecified: Secondary | ICD-10-CM | POA: Diagnosis not present

## 2021-10-25 DIAGNOSIS — F331 Major depressive disorder, recurrent, moderate: Secondary | ICD-10-CM | POA: Diagnosis not present

## 2021-11-08 DIAGNOSIS — M170 Bilateral primary osteoarthritis of knee: Secondary | ICD-10-CM | POA: Diagnosis not present

## 2021-11-11 ENCOUNTER — Ambulatory Visit: Payer: Self-pay | Admitting: *Deleted

## 2021-11-11 NOTE — Telephone Encounter (Signed)
Patient is calling to report he is having constipation. Patient states his stool is so hard- he is having a hard time having bowel movement- he has little pebble shaped stool and pressure at the rectum. Patient is using stool softener 3 times/day- but not being able to move the hard stool. Patient advised- sitz bath, glycerin suppository, laxative, enema if needed. Patient advised if unable to get stool to move- or unable to digitally remove stool from rectal opening- my need to be seen at [facility]. Patient voices understanding.

## 2021-11-11 NOTE — Telephone Encounter (Signed)
Reason for Disposition  [1] Rectal pain or fullness from fecal impaction (rectum full of stool) AND [2] has not tried SITZ bath, suppository or enema  Answer Assessment - Initial Assessment Questions 1. STOOL PATTERN OR FREQUENCY: "How often do you have a bowel movement (BM)?"  (Normal range: 3 times a day to every 3 days)  "When was your last BM?"       Daily BM- little last night 2. STRAINING: "Do you have to strain to have a BM?"      yes 3. RECTAL PAIN: "Does your rectum hurt when the stool comes out?" If Yes, ask: "Do you have hemorrhoids? How bad is the pain?"  (Scale 1-10; or mild, moderate, severe)     yes 4. STOOL COMPOSITION: "Are the stools hard?"      yes 5. BLOOD ON STOOLS: "Has there been any blood on the toilet tissue or on the surface of the BM?" If Yes, ask: "When was the last time?"      no 6. CHRONIC CONSTIPATION: "Is this a new problem for you?"  If no, ask: "How long have you had this problem?" (days, weeks, months)      Normally no 7. CHANGES IN DIET OR HYDRATION: "Have there been any recent changes in your diet?" "How much fluids are you drinking on a daily basis?"  "How much have you had to drink today?"     No changes- patient drinks at least for cups/day 8. MEDICATIONS: "Have you been taking any new medications?" "Are you taking any narcotic pain medications?" (e.g., Vicodin, Percocet, morphine, Dilaudid)     Trulicity-increased dose 9. LAXATIVES: "Have you been using any stool softeners, laxatives, or enemas?"  If yes, ask "What, how often, and when was the last time?"     Patient is using stool softener 3 times /day 10. ACTIVITY:  "How much walking do you do every day?"  "Has your activity level decreased in the past week?"        Yes- lately with back and knee problems has decreased 11. CAUSE: "What do you think is causing the constipation?"        Medication, decreased activity 12. OTHER SYMPTOMS: "Do you have any other symptoms?" (e.g., abdominal pain,  bloating, fever, vomiting)       no 13. MEDICAL HISTORY: "Do you have a history of hemorrhoids, rectal fissures, or rectal surgery or rectal abscess?"         hemorrhoids 14. PREGNANCY: "Is there any chance you are pregnant?" "When was your last menstrual period?"       na  Protocols used: Constipation-A-AH

## 2021-12-06 DIAGNOSIS — E1140 Type 2 diabetes mellitus with diabetic neuropathy, unspecified: Secondary | ICD-10-CM | POA: Diagnosis not present

## 2021-12-06 DIAGNOSIS — Z923 Personal history of irradiation: Secondary | ICD-10-CM | POA: Diagnosis not present

## 2021-12-06 DIAGNOSIS — Z7985 Long-term (current) use of injectable non-insulin antidiabetic drugs: Secondary | ICD-10-CM | POA: Diagnosis not present

## 2021-12-06 DIAGNOSIS — Z8546 Personal history of malignant neoplasm of prostate: Secondary | ICD-10-CM | POA: Diagnosis not present

## 2021-12-06 DIAGNOSIS — T466X5D Adverse effect of antihyperlipidemic and antiarteriosclerotic drugs, subsequent encounter: Secondary | ICD-10-CM | POA: Diagnosis not present

## 2021-12-06 DIAGNOSIS — E785 Hyperlipidemia, unspecified: Secondary | ICD-10-CM | POA: Diagnosis not present

## 2021-12-06 DIAGNOSIS — I10 Essential (primary) hypertension: Secondary | ICD-10-CM | POA: Diagnosis not present

## 2021-12-06 DIAGNOSIS — Z6841 Body Mass Index (BMI) 40.0 and over, adult: Secondary | ICD-10-CM | POA: Diagnosis not present

## 2021-12-06 DIAGNOSIS — Z7984 Long term (current) use of oral hypoglycemic drugs: Secondary | ICD-10-CM | POA: Diagnosis not present

## 2021-12-06 DIAGNOSIS — G619 Inflammatory polyneuropathy, unspecified: Secondary | ICD-10-CM | POA: Diagnosis not present

## 2021-12-07 ENCOUNTER — Emergency Department: Payer: PPO

## 2021-12-07 ENCOUNTER — Other Ambulatory Visit: Payer: Self-pay

## 2021-12-07 ENCOUNTER — Encounter: Payer: Self-pay | Admitting: Emergency Medicine

## 2021-12-07 ENCOUNTER — Ambulatory Visit: Payer: Self-pay | Admitting: *Deleted

## 2021-12-07 ENCOUNTER — Emergency Department
Admission: EM | Admit: 2021-12-07 | Discharge: 2021-12-08 | Disposition: A | Discharge status: Home / Self Care | Payer: PPO | Attending: Emergency Medicine | Admitting: Emergency Medicine

## 2021-12-07 DIAGNOSIS — R791 Abnormal coagulation profile: Secondary | ICD-10-CM | POA: Insufficient documentation

## 2021-12-07 DIAGNOSIS — Z7984 Long term (current) use of oral hypoglycemic drugs: Secondary | ICD-10-CM | POA: Diagnosis not present

## 2021-12-07 DIAGNOSIS — R739 Hyperglycemia, unspecified: Secondary | ICD-10-CM

## 2021-12-07 DIAGNOSIS — R0789 Other chest pain: Secondary | ICD-10-CM | POA: Diagnosis not present

## 2021-12-07 DIAGNOSIS — R7989 Other specified abnormal findings of blood chemistry: Secondary | ICD-10-CM

## 2021-12-07 DIAGNOSIS — E1149 Type 2 diabetes mellitus with other diabetic neurological complication: Secondary | ICD-10-CM | POA: Diagnosis not present

## 2021-12-07 DIAGNOSIS — E1165 Type 2 diabetes mellitus with hyperglycemia: Secondary | ICD-10-CM | POA: Insufficient documentation

## 2021-12-07 DIAGNOSIS — Z20822 Contact with and (suspected) exposure to covid-19: Secondary | ICD-10-CM | POA: Diagnosis not present

## 2021-12-07 DIAGNOSIS — I10 Essential (primary) hypertension: Secondary | ICD-10-CM | POA: Insufficient documentation

## 2021-12-07 DIAGNOSIS — Z79899 Other long term (current) drug therapy: Secondary | ICD-10-CM | POA: Insufficient documentation

## 2021-12-07 DIAGNOSIS — Z794 Long term (current) use of insulin: Secondary | ICD-10-CM | POA: Insufficient documentation

## 2021-12-07 DIAGNOSIS — Z8546 Personal history of malignant neoplasm of prostate: Secondary | ICD-10-CM | POA: Diagnosis not present

## 2021-12-07 DIAGNOSIS — R911 Solitary pulmonary nodule: Secondary | ICD-10-CM | POA: Insufficient documentation

## 2021-12-07 DIAGNOSIS — R079 Chest pain, unspecified: Secondary | ICD-10-CM

## 2021-12-07 LAB — TROPONIN I (HIGH SENSITIVITY)
Troponin I (High Sensitivity): 3 ng/L (ref <18)
Troponin I (High Sensitivity): 3 ng/L (ref <18)

## 2021-12-07 LAB — BASIC METABOLIC PANEL
Anion gap: 8 (ref 5–15)
BUN: 15 mg/dL (ref 8–23)
CO2: 25 mmol/L (ref 22–32)
Calcium: 8.3 mg/dL — ABNORMAL LOW (ref 8.9–10.3)
Chloride: 102 mmol/L (ref 98–111)
Creatinine, Ser: 1.2 mg/dL (ref 0.61–1.24)
GFR, Estimated: >60 mL/min (ref >60)
Glucose, Bld: 256 mg/dL — ABNORMAL HIGH (ref 70–99)
Potassium: 3.7 mmol/L (ref 3.5–5.1)
Sodium: 135 mmol/L (ref 135–145)

## 2021-12-07 LAB — CBC
HCT: 41 % (ref 39.0–52.0)
Hemoglobin: 13.4 g/dL (ref 13.0–17.0)
MCH: 28.9 pg (ref 26.0–34.0)
MCHC: 32.7 g/dL (ref 30.0–36.0)
MCV: 88.4 fL (ref 80.0–100.0)
Platelets: 217 10*3/uL (ref 150–400)
RBC: 4.64 MIL/uL (ref 4.22–5.81)
RDW: 14.2 % (ref 11.5–15.5)
WBC: 10 10*3/uL (ref 4.0–10.5)
nRBC: 0 % (ref 0.0–0.2)

## 2021-12-07 NOTE — Telephone Encounter (Signed)
Pt reports pain in left lung area when taking deep breathes. Attempted to get pt scheduled for an appt but there was no appt available. Pt stated he will be going out of town and needs to be seen soon. Cb# [phone]     Chief Complaint: Left sided CP with inspiration Symptoms: CP, left sided, "When I breathe in" Frequency: Onset yesterday Pertinent Negatives: Patient denies cough, cold symptoms, Disposition: [x] ED /[] Urgent Care (no appt availability in office) / [] Appointment(In office/virtual)/ []  [facility]/ [] Home Care/ [] Refused Recommended Disposition  Additional Notes: States will follow ED disposition.     Reason for Disposition  Taking a deep breath makes pain worse  Answer Assessment - Initial Assessment Questions 1. LOCATION: "Where does it hurt?"       Top area of left chest 2. RADIATION: "Does the pain go anywhere else?" (e.g., into neck, jaw, arms, back)     To neck and top of lung area 3. ONSET: "When did the chest pain begin?" (Minutes, hours or days)      Yesterday 4. PATTERN "Does the pain come and go, or has it been constant since it started?"  "Does it get worse with exertion?"      When taking a deep breath in or "Sighing" 5. DURATION: "How long does it last" (e.g., seconds, minutes, hours)     When take a deep breath or sighing 6. SEVERITY: "How bad is the pain?"  (e.g., Scale 1-10; mild, moderate, or severe)    - MILD (1-3): doesn't interfere with normal activities     - MODERATE (4-7): interferes with normal activities or awakens from sleep    - SEVERE (8-10): excruciating pain, unable to do any normal activities       Shooting 3/10  "Ache, sore" 7. CARDIAC RISK FACTORS: "Do you have any history of heart problems or risk factors for heart disease?" (e.g., angina, prior heart attack; diabetes, high blood pressure, high cholesterol, smoker, or strong family history of heart disease)     yes 8. PULMONARY RISK FACTORS: "Do you have any history of  lung disease?"  (e.g., blood clots in lung, asthma, emphysema, birth control pills)     yes 9. CAUSE: "What do you think is causing the chest pain?"     unsure 10. OTHER SYMPTOMS: "Do you have any other symptoms?" (e.g., dizziness, nausea, vomiting, sweating, fever, difficulty breathing, cough)       No  Protocols used: Chest Pain-A-AH

## 2021-12-07 NOTE — ED Triage Notes (Signed)
Pt to ED via POV with c/o CP that began yesterday. He states that it is on the left side and that it sore when he takes a deep breath. Denies any nausea or diaphoresis.

## 2021-12-07 NOTE — ED Provider Notes (Signed)
[facility] Emergency Department Provider Note  ____________________________________________   Event Date/Time   First MD Initiated Contact with Patient 12/07/21 2256     (approximate)  I have reviewed the triage vital signs and the nursing notes.   HISTORY  Chief Complaint Chest Pain   HPI Edward King is a 70 y.o. male with below noted past medical history who presents for assessment of some left-sided chest discomfort and soreness in the shoulders with deep breathing.  He states he noticed this this afternoon.  This is in the setting of couple weeks of on and off cough and congestion.  Patient denies any shortness of breath, chest pain other than when he is taking deep breaths or presently, fevers, nausea, vomiting, diarrhea, rash, burning with urination, abdominal pain, headache, earache, sore throat or any change from some chronic back pain.  He states he thinks his sugars might be a little high as well today because he went to a restaurant before coming to the emergency room.  He is compliant with all his medications and does not use drugs or drink alcohol daily.  No other acute concerns at this time.         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  mostly left   Neuromuscular disorder ([place])    Prostate CA ([place]) 2016   Radiation seed implants.   Stroke [facility])    Temporal arteritis ([place])    Clinically dx Dr. Jefm Bryant. 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 Minimal thisckening left Anterograde flow vertebrals   COLONOSCOPY WITH PROPOFOL N/A 02/13/2018   Procedure: COLONOSCOPY WITH PROPOFOL;  Surgeon: Manya Silvas, MD;  Location: [facility] ENDOSCOPY;  Service: Endoscopy;  Laterality: N/A;   CT of the head  08/22/2014   [facility]. Normal   DOPPLER ECHOCARDIOGRAPHY  08/29/2014   EF=60-65%. Normal LVEF   Myocardial Perfusion Scan  09/10/2012   Poor exercsie tolerance, but no evidence of stress induced myocardial ischemia. EF=66%. Dr. Clayborn Bigness   NASAL SEPTOPLASTY W/ TURBINOPLASTY Bilateral 02/10/2016   Procedure: NASAL SEPTOPLASTY WITH INFERIOR TURBINATE REDUCTION;  Surgeon: Carloyn Manner, MD;  Location: [facility] ORS;  Service: ENT;  Laterality: Bilateral;   pannectomy     TEMPORAL ARTERY BIOPSY / LIGATION Left     Prior to Admission medications   Medication Sig Start Date End  Date Taking? Authorizing Provider  buPROPion (WELLBUTRIN SR) 150 MG 12 hr tablet Take 150 mg by mouth daily.    [provider]  citalopram (CELEXA) 40 MG tablet Take 40 mg by mouth daily.     [provider]  Coenzyme Q10 (COQ-10) 100 MG CAPS Take 2,000 mg by mouth daily.    [provider]  Cyanocobalamin (VITAMIN B-12 PO) Take 1,000 mcg by mouth daily at 6 (six) AM.    [provider]  Dulaglutide (TRULICITY) 4.5 DJ/5.7SV SOPN Inject 4.5 mg as directed once a week. 04/22/21   Birdie Sons, MD  gabapentin  (NEURONTIN) 300 MG capsule TAKE 2 CAPSULES BY MOUTH IN THE MORNING AND 3 CAPSULES AT BEDTIME 06/24/21   Birdie Sons, MD  glucose blood (CONTOUR NEXT TEST) test strip Use as instructed to check blood sugar daily 10/22/18   Birdie Sons, MD  meloxicam (MOBIC) 15 MG tablet Take 15 mg by mouth daily. 08/24/19   [provider]  mesalamine (APRISO) 0.375 g 24 hr capsule Take 375 mg by mouth daily. 08/10/20   [provider]  metFORMIN (GLUCOPHAGE-XR) 500 MG 24 hr tablet Take 2 tablets (1,000 mg total) by mouth daily. 04/22/21   Birdie Sons, MD  mupirocin nasal ointment (BACTROBAN NASAL) 2 % Place 1 application into the nose 2 (two) times daily. Use one-half of tube in each nostril twice daily for five (5) days. After application, press sides of nose together and gently massage. 12/22/20   Birdie Sons, MD  pravastatin (PRAVACHOL) 40 MG tablet TAKE 1 TABLET (40 MG TOTAL) BY MOUTH DAILY WITH LUNCH. 05/27/20   Birdie Sons, MD  valsartan-hydrochlorothiazide (DIOVAN-HCT) 80-12.5 MG tablet TAKE 1 TABLET BY MOUTH EVERY DAY 08/17/21   Birdie Sons, MD  Vitamin D, Cholecalciferol, 1000 units TABS Take 2,000 Units by mouth daily.    [provider]    Allergies Patient has no known allergies.  Family History  Problem Relation Age of Onset   Colon cancer Mother    Coronary artery disease Mother    Prostate cancer Father    Kidney disease Father    Dementia Brother    Diabetes Neg Hx     Social History Social History   Tobacco Use   Smoking status: Never   Smokeless tobacco: Never  Vaping Use   Vaping Use: Never used  Substance Use Topics   Alcohol use: Yes    Alcohol/week: 0.0 standard drinks    Comment: Rare - 1 drink   Drug use: No    Review of Systems  Review of Systems  Constitutional:  Negative for chills and fever.  HENT:  Positive for congestion. Negative for sore throat.   Eyes:  Negative for pain.  Respiratory:  Positive for cough  (intermittent over past couple of months). Negative for stridor.   Cardiovascular:  Positive for chest pain.  Gastrointestinal:  Negative for vomiting.  Genitourinary:  Negative for dysuria.  Musculoskeletal:  Positive for myalgias (b/l shoulders).  Skin:  Negative for rash.  Neurological:  Negative for seizures, loss of consciousness and headaches.  Psychiatric/Behavioral:  Negative for suicidal ideas.   All other systems reviewed and are negative.    ____________________________________________   PHYSICAL EXAM:  VITAL SIGNS: ED Triage Vitals  Enc Vitals Group     BP 12/07/21 1612 (!) 191/171     Pulse Rate 12/07/21 1612 99     Resp 12/07/21 1612 (!) 21  Temp 12/07/21 1612 98.8 F (37.1 C)     Temp Source 12/07/21 1612 Oral     SpO2 12/07/21 1612 93 %     Weight 12/07/21 1613 300 lb (136.1 kg)     Height 12/07/21 1613 5' 11"  (1.803 m)     Head Circumference --      Peak Flow --      Pain Score 12/07/21 1612 3     Pain Loc --      Pain Edu? --      Excl. in [place]? --    Vitals:   12/08/21 0030 12/08/21 0040  BP:  (!) 142/80  Pulse: 72 75  Resp: 16 14  Temp:    SpO2: 97% 96%   Physical Exam Vitals and nursing note reviewed.  Constitutional:      General: He is not in acute distress.    Appearance: He is well-developed. He is obese.  HENT:     Head: Normocephalic and atraumatic.  Eyes:     Conjunctiva/sclera: Conjunctivae normal.  Cardiovascular:     Rate and Rhythm: Normal rate and regular rhythm.     Heart sounds: No murmur heard. Pulmonary:     Effort: Pulmonary effort is normal. Tachypnea present. No respiratory distress.     Breath sounds: Normal breath sounds.  Abdominal:     Palpations: Abdomen is soft.     Tenderness: There is no abdominal tenderness.  Musculoskeletal:        General: No swelling.     Cervical back: Neck supple.  Skin:    General: Skin is warm and dry.     Capillary Refill: Capillary refill takes less than 2 seconds.   Neurological:     Mental Status: He is alert.  Psychiatric:        Mood and Affect: Mood normal.     ____________________________________________   LABS (all labs ordered are listed, but only abnormal results are displayed)  Labs Reviewed  BASIC METABOLIC PANEL - Abnormal; Notable for the following components:      Result Value   Glucose, Bld 256 (*)    Calcium 8.3 (*)    All other components within normal limits  D-DIMER, QUANTITATIVE - Abnormal; Notable for the following components:   D-Dimer, Quant 2.20 (*)    All other components within normal limits  RESP PANEL BY RT-PCR (FLU A&B, COVID) ARPGX2  CBC  TROPONIN I (HIGH SENSITIVITY)  TROPONIN I (HIGH SENSITIVITY)   ____________________________________________  EKG  ECG remarkable for sinus rhythm with a ventricular rate of 98, incomplete right bundle branch block and nonspecific ST change in lateral and inferior leads without other clear evidence of acute ischemia or significant arrhythmia. ____________________________________________  RADIOLOGY  ED MD interpretation: Chest x-ray shows no focal consolidation, effusion, edema, pneumothorax or other clear acute thoracic process.  CTA chest shows no evidence of PE, dissection, aneurysmal dilation, pneumonia, effusion, edema, pneumothorax or other clear acute process.  There is a previously noted lung nodule that appears benign and stable.  No other acute thoracic process noted.   Official radiology report(s): DG Chest 2 View  Result Date: 12/07/2021 CLINICAL DATA:  Chest pain EXAM: CHEST - 2 VIEW COMPARISON:  CT chest dated February 04, 2018 FINDINGS: The heart size and mediastinal contours are within normal limits. Both lungs are clear. The visualized skeletal structures are unremarkable. IMPRESSION: No active cardiopulmonary disease. Electronically Signed   By: Keane Police D.O.   On: 12/07/2021 17:56   CT  Angio Chest PE W and/or Wo Contrast  Result Date:  12/08/2021 CLINICAL DATA:  Chest pain for 2 days on the left EXAM: CT ANGIOGRAPHY CHEST WITH CONTRAST TECHNIQUE: Multidetector CT imaging of the chest was performed using the standard protocol during bolus administration of intravenous contrast. Multiplanar CT image reconstructions and MIPs were obtained to evaluate the vascular anatomy. CONTRAST:  136m OMNIPAQUE IOHEXOL 350 MG/ML SOLN COMPARISON:  Chest x-ray from the previous day, 02/04/2018. FINDINGS: Cardiovascular: Thoracic aorta shows no aneurysmal dilatation or evidence of dissection. No cardiac enlargement is seen. The pulmonary artery shows a normal branching pattern. No intraluminal filling defect to suggest pulmonary embolism is noted. No coronary calcifications are seen. No pericardial effusion is noted. Mediastinum/Nodes: Thoracic inlet is within normal limits. Scattered small mediastinal lymph nodes are noted similar to that seen on a prior exam from 2019. The esophagus is within normal limits. Lungs/Pleura: Lungs are well aerated bilaterally. No focal confluent infiltrate is seen. No sizable effusion is noted. A nodular density is noted along the major fissure in the right lower lobe measuring up to 11 mm but stable in appearance from the prior exam consistent with a benign etiology. No new focal nodules are seen. Upper Abdomen: No acute abnormality. Musculoskeletal: Degenerative changes of the thoracic spine are noted. No acute rib abnormality is noted. Review of the MIP images confirms the above findings. IMPRESSION: No evidence of pulmonary emboli. 11 mm nodule in the right lower lobe along the major fissure stable dating back to 2017 consistent with a benign etiology. No acute abnormality noted. Electronically Signed   By: MInez CatalinaM.D.   On: 12/08/2021 01:41    ____________________________________________   PROCEDURES  Procedure(s) performed (including Critical Care):  .1-3 Lead EKG Interpretation Performed by: SLucrezia Starch  MD Authorized by: SLucrezia Starch MD     Interpretation: normal     ECG rate assessment: tachycardic     Rhythm: sinus rhythm     Ectopy: none     Conduction: normal     ____________________________________________   INITIAL IMPRESSION / ASSESSMENT AND PLAN / ED COURSE      Patient presents with above-stated history and exam for assessment of some left-sided chest discomfort with deep breathing.  He does not feel short of breath and does not have any pain except with deep breathing.  He has had a couple weeks of intermittent ingestion and mild nonproductive cough but this is not any different today.  No fevers, right-sided pain, acute back pain or any other clear associated sick symptoms.  While initially patient was charted hypertensive with a BP of 191/171 in triage and ED evaluation room a recheck this and patient's blood pressures in the 120s over 90s.  I think initial blood pressure is likely erroneous.  Otherwise she is with stable vital signs with an SPO2 of 95% on room air.  Left chest is unremarkable there is no evidence of a cellulitis or shingles and is not clearly reproducible.  Differential considerations include ACS, PE, pneumonia, bronchitis, pericarditis, myocarditis, chest wall inflammation and costochondritis with lower suspicion for a GI etiology this time.  ECG remarkable for sinus rhythm with a ventricular rate of 98, incomplete right bundle branch block and nonspecific ST change in lateral and inferior leads without other clear evidence of acute ischemia or significant arrhythmia.  Given nonelevated troponin x2 I have low suspicion for ACS or myocarditis.  Chest x-ray shows no focal consolidation, effusion, edema, pneumothorax or other clear acute  thoracic process.  CBC shows no leukocytosis or acute anemia and overall I have low suspicion for bacterial pneumonia at this time.  BMP remarkable for glucose of 256 without any other significant electrolyte or metabolic  derangements.  COVID and influenza PCR is negative.  D-dimer obtained is elevated 2.2.  This obtained a CTA to rule out PE.   CTA chest shows no evidence of PE, dissection, aneurysmal dilation, pneumonia, effusion, edema, pneumothorax or other clear acute process.  There is a previously noted lung nodule that appears benign and stable.  No other acute thoracic process noted.  Given reassuring vitals with no evidence of PE on CTA and otherwise reassuring exam with patient stating he is currently chest pain-free and not feeling any shortness of breath I think is stable for discharge with outpatient follow-up.  Patient states he is already aware of the lung nodule and PCP is following this.     ____________________________________________   FINAL CLINICAL IMPRESSION(S) / ED DIAGNOSES  Final diagnoses:  Chest pain, unspecified type  Hyperglycemia  Positive D dimer  Lung nodule    Medications  iohexol (OMNIPAQUE) 350 MG/ML injection 100 mL (100 mLs Intravenous Contrast Given 12/08/21 0122)     ED Discharge Orders     None        Note:  This document was prepared using Dragon voice recognition software and may include unintentional dictation errors.    Lucrezia Starch, MD 12/08/21 [phone]

## 2021-12-08 ENCOUNTER — Encounter: Payer: Self-pay | Admitting: Radiology

## 2021-12-08 ENCOUNTER — Emergency Department: Payer: PPO

## 2021-12-08 DIAGNOSIS — R079 Chest pain, unspecified: Secondary | ICD-10-CM | POA: Diagnosis not present

## 2021-12-08 LAB — RESP PANEL BY RT-PCR (FLU A&B, COVID) ARPGX2
Influenza A by PCR: NEGATIVE
Influenza B by PCR: NEGATIVE
SARS Coronavirus 2 by RT PCR: NEGATIVE

## 2021-12-08 LAB — D-DIMER, QUANTITATIVE: D-Dimer, Quant: 2.2 ug/mL-FEU — ABNORMAL HIGH (ref 0.00–0.50)

## 2021-12-08 MED ORDER — IOHEXOL 350 MG/ML SOLN
100.0000 mL | Freq: Once | INTRAVENOUS | Status: AC | PRN
  Administered 2021-12-08: 01:00:00 100 mL via INTRAVENOUS

## 2022-01-16 DIAGNOSIS — M791 Myalgia, unspecified site: Secondary | ICD-10-CM | POA: Diagnosis not present

## 2022-01-16 DIAGNOSIS — M5416 Radiculopathy, lumbar region: Secondary | ICD-10-CM | POA: Diagnosis not present

## 2022-01-16 DIAGNOSIS — R269 Unspecified abnormalities of gait and mobility: Secondary | ICD-10-CM | POA: Diagnosis not present

## 2022-01-16 DIAGNOSIS — M545 Low back pain, unspecified: Secondary | ICD-10-CM | POA: Diagnosis not present

## 2022-01-16 DIAGNOSIS — M47817 Spondylosis without myelopathy or radiculopathy, lumbosacral region: Secondary | ICD-10-CM | POA: Diagnosis not present

## 2022-01-17 DIAGNOSIS — F331 Major depressive disorder, recurrent, moderate: Secondary | ICD-10-CM | POA: Diagnosis not present

## 2022-01-19 DIAGNOSIS — H35713 Central serous chorioretinopathy, bilateral: Secondary | ICD-10-CM | POA: Diagnosis not present

## 2022-01-25 DIAGNOSIS — M170 Bilateral primary osteoarthritis of knee: Secondary | ICD-10-CM | POA: Diagnosis not present

## 2022-01-27 ENCOUNTER — Ambulatory Visit: Payer: PPO | Admitting: Family Medicine

## 2022-01-27 NOTE — Progress Notes (Deleted)
Established patient visit   Patient: Edward King   DOB: 1951-02-27   71 y.o. Male  MRN: [id number] Visit Date: 01/27/2022  Today's healthcare provider: Lelon Huh, MD   No chief complaint on file.  Subjective    HPI  Diabetes Mellitus Type II, Follow-up  Lab Results  Component Value Date   HGBA1C 6.7 (A) 08/26/2021   HGBA1C 6.4 (A) 04/22/2021   HGBA1C 6.7 (A) 12/22/2020   Wt Readings from Last 3 Encounters:  12/07/21 300 lb (136.1 kg)  08/26/21 (!) 304 lb (137.9 kg)  04/22/21 (!) 301 lb (136.5 kg)   Last seen for diabetes {1-12:18279} {days/wks/mos/yrs:310907} ago.  Management since then includes ***. He reports {excellent/good/fair/poor:19665} compliance with treatment. He {is/is not:21021397} having side effects. {document side effects if present:1} Symptoms: {Yes/No:20286} fatigue {Yes/No:20286} foot ulcerations  {Yes/No:20286} appetite changes {Yes/No:20286} nausea  {Yes/No:20286} paresthesia of the feet  {Yes/No:20286} polydipsia  {Yes/No:20286} polyuria {Yes/No:20286} visual disturbances   {Yes/No:20286} vomiting     Home blood sugar records: {diabetes glucometry results:16657}  Episodes of hypoglycemia? {Yes/No:20286} {enter symptoms and frequency of symptoms if yes:1}   Current insulin regiment: {enter 'none' or type of insulin and number of units taken with each dose of each insulin formulation that the patient is taking:1} Most Recent Eye Exam: *** {Current exercise:16438:::1} {Current diet habits:16563:::1}  Pertinent Labs: Lab Results  Component Value Date   CHOL 146 08/26/2021   HDL 64 08/26/2021   LDLCALC 68 08/26/2021   TRIG 69 08/26/2021   CHOLHDL 2.3 08/26/2021   Lab Results  Component Value Date   NA 135 12/07/2021   K 3.7 12/07/2021   CREATININE 1.20 12/07/2021   GFRNONAA >60 12/07/2021   MICROALBUR 20 01/24/2019     ---------------------------------------------------------------------------------------------------    Medications: Outpatient Medications Prior to Visit  Medication Sig   buPROPion (WELLBUTRIN SR) 150 MG 12 hr tablet Take 150 mg by mouth daily.   citalopram (CELEXA) 40 MG tablet Take 40 mg by mouth daily.    Coenzyme Q10 (COQ-10) 100 MG CAPS Take 2,000 mg by mouth daily.   Cyanocobalamin (VITAMIN B-12 PO) Take 1,000 mcg by mouth daily at 6 (six) AM.   Dulaglutide (TRULICITY) 4.5 UR/4.2HC SOPN Inject 4.5 mg as directed once a week.   gabapentin (NEURONTIN) 300 MG capsule TAKE 2 CAPSULES BY MOUTH IN THE MORNING AND 3 CAPSULES AT BEDTIME   glucose blood (CONTOUR NEXT TEST) test strip Use as instructed to check blood sugar daily   meloxicam (MOBIC) 15 MG tablet Take 15 mg by mouth daily.   mesalamine (APRISO) 0.375 g 24 hr capsule Take 375 mg by mouth daily.   metFORMIN (GLUCOPHAGE-XR) 500 MG 24 hr tablet Take 2 tablets (1,000 mg total) by mouth daily.   mupirocin nasal ointment (BACTROBAN NASAL) 2 % Place 1 application into the nose 2 (two) times daily. Use one-half of tube in each nostril twice daily for five (5) days. After application, press sides of nose together and gently massage.   pravastatin (PRAVACHOL) 40 MG tablet TAKE 1 TABLET (40 MG TOTAL) BY MOUTH DAILY WITH LUNCH.   valsartan-hydrochlorothiazide (DIOVAN-HCT) 80-12.5 MG tablet TAKE 1 TABLET BY MOUTH EVERY DAY   Vitamin D, Cholecalciferol, 1000 units TABS Take 2,000 Units by mouth daily.   No facility-administered medications prior to visit.    Review of Systems  {Labs   Heme   Chem   Endocrine   Serology   Results Review (optional):23779}   Objective  There were no vitals taken for this visit. {Show previous vital signs (optional):23777}  Physical Exam  ***  No results found for any visits on 01/27/22.  Assessment & Plan     ***  No follow-ups on file.      {provider attestation***:1}   Lelon Huh, MD  [facility] [phone] (phone) [phone] (fax)  [place]

## 2022-02-18 ENCOUNTER — Other Ambulatory Visit: Payer: Self-pay | Admitting: Family Medicine

## 2022-02-18 DIAGNOSIS — E1149 Type 2 diabetes mellitus with other diabetic neurological complication: Secondary | ICD-10-CM

## 2022-03-17 ENCOUNTER — Encounter: Payer: PPO | Admitting: Family Medicine

## 2022-04-11 DIAGNOSIS — F331 Major depressive disorder, recurrent, moderate: Secondary | ICD-10-CM | POA: Diagnosis not present

## 2022-04-18 ENCOUNTER — Ambulatory Visit (INDEPENDENT_AMBULATORY_CARE_PROVIDER_SITE_OTHER): Payer: PPO | Admitting: Physician Assistant

## 2022-04-18 DIAGNOSIS — I10 Essential (primary) hypertension: Secondary | ICD-10-CM

## 2022-04-18 NOTE — Progress Notes (Deleted)
?  ? ? ?  Established patient visit ? ? ?NO SHOW ?

## 2022-04-20 ENCOUNTER — Encounter: Payer: Self-pay | Admitting: Physician Assistant

## 2022-05-04 NOTE — Progress Notes (Addendum)
I,Edward King,acting as a scribe for Edward Huh, MD.,have documented all relevant documentation on the behalf of Edward Huh, MD,as directed by  Edward Huh, MD while in the presence of Edward Huh, MD.   Annual Wellness Visit     Patient: Edward King, Male    DOB: [birth year], 71 y.o.   MRN: [id number] Visit Date: 05/08/2022  Today's Provider: Lelon Huh, MD   Chief Complaint  Patient presents with   Annual Exam   Subjective    Edward King is a 71 y.o. male who presents today for his Annual Wellness Visit. He reports consuming a general diet. The patient does not participate in regular exercise at present. He generally feels poorly. He reports sleeping well. He does have additional problems to discuss today.   Patient reports continued head and chest congestion.  Patient drove himself to office by seemed in distress when called back.  Said he could not walk and needed w/c.  Reports feeling "wooing"  and weak, light headed since last Thursday.  Was seen last in office by PA for acute cough on 05-05-22. Was also noted be hypertensive and complaining of urinary frequency at that time. He was started on Levaquin, codeine cough syrup, had his valsartan-hctz doubled and started on tamsulosin. He was having some mild dizziness prior to his visit last week, but has been much much worse the last two days. He was prescribed prednisone but never started it. He had a fall Thursday night and and scraped his arm.    BP Readings from Last 3 Encounters:  05/08/22 101/76  05/05/22 (!) 155/81  12/08/21 (!) 146/79    Medications: Outpatient Medications Prior to Visit  Medication Sig   buPROPion (WELLBUTRIN SR) 150 MG 12 hr tablet Take 150 mg by mouth daily.   citalopram (CELEXA) 40 MG tablet Take 40 mg by mouth daily.    Coenzyme Q10 (COQ-10) 100 MG CAPS Take 2,000 mg by mouth daily.   Cyanocobalamin (VITAMIN B-12 PO) Take 1,000 mcg by mouth daily at 6 (six) AM.   Dulaglutide  (TRULICITY) 4.5 AU/6.3FH SOPN Inject 4.5 mg as directed once a week.   gabapentin (NEURONTIN) 300 MG capsule TAKE 2 CAPSULES BY MOUTH IN THE MORNING AND 3 CAPSULES AT BEDTIME   glucose blood (CONTOUR NEXT TEST) test strip Use as instructed to check blood sugar daily   guaiFENesin-codeine 100-10 MG/5ML syrup Take 10 mLs by mouth at bedtime and may repeat dose one time if needed.   levofloxacin (LEVAQUIN) 750 MG tablet Take 1 tablet (750 mg total) by mouth daily.   meloxicam (MOBIC) 15 MG tablet Take 15 mg by mouth daily.   mesalamine (APRISO) 0.375 g 24 hr capsule Take 375 mg by mouth daily.   metFORMIN (GLUCOPHAGE-XR) 500 MG 24 hr tablet TAKE 2 TABLETS BY MOUTH TWICE A DAY   mupirocin nasal ointment (BACTROBAN NASAL) 2 % Place 1 application into the nose 2 (two) times daily. Use one-half of tube in each nostril twice daily for five (5) days. After application, press sides of nose together and gently massage.   pravastatin (PRAVACHOL) 40 MG tablet TAKE 1 TABLET (40 MG TOTAL) BY MOUTH DAILY WITH LUNCH.   predniSONE (STERAPRED UNI-PAK 21 TAB) 10 MG (21) TBPK tablet Take as directed; recommended with morning meal.   tamsulosin (FLOMAX) 0.4 MG CAPS capsule Take 1 capsule (0.4 mg total) by mouth daily.   valsartan-hydrochlorothiazide (DIOVAN-HCT) 160-25 MG tablet Take 1 tablet by mouth daily.  Vitamin D, Cholecalciferol, 1000 units TABS Take 2,000 Units by mouth daily.   No facility-administered medications prior to visit.    No Known Allergies  Patient Care Team: Birdie Sons, MD as PCP - General (Family Medicine) Yolonda Kida, MD as Consulting Physician (Cardiology) Emmaline Kluver., MD as Consulting Physician (Rheumatology) Royston Cowper, MD as Consulting Physician (Urology) Thurmond Butts Orson Ape, MD as Attending Physician (Psychiatry) Eulogio Bear, MD as Consulting Physician (Ophthalmology) Pilar Jarvis, MD as Referring Physician (Pain Medicine)        Objective    Vitals: BP 101/76 (BP Location: Right Arm, Patient Position: Sitting, Cuff Size: Normal)   Pulse (!) 110   Temp 98.5 F (36.9 C) (Oral)   Resp 18   Wt (!) 308 lb (139.7 kg)   SpO2 98%   BMI 42.96 kg/m     Physical Exam Patient lying on exam table very uncomfortable. Very slow to sit or stand and requires maximum assistance.  Chest with diffuse expiratory wheezes and rhonchi. No labored breathing. CV RRR without murmurs. 2+ bipedal edema.   Most recent functional status assessment:    05/05/2022    2:53 PM  In your present state of health, do you have any difficulty performing the following activities:  Hearing? 1  Vision? 1  Difficulty concentrating or making decisions? 0  Walking or climbing stairs? 1  Dressing or bathing? 0  Doing errands, shopping? 0   Most recent fall risk assessment:    05/05/2022    2:52 PM  Fall Risk   Falls in the past year? 0  Number falls in past yr: 0  Injury with Fall? 0  Follow up Falls evaluation completed    Most recent depression screenings:    05/05/2022    2:52 PM 03/14/2021    2:52 PM  PHQ 2/9 Scores  PHQ - 2 Score 0 1  PHQ- 9 Score 10    Most recent cognitive screening:    08/20/2019   11:04 AM  6CIT Screen  What Year? 0 points  What month? 0 points  What time? 0 points  Count back from 20 2 points  Months in reverse 0 points  Repeat phrase 2 points  Total Score 4 points   Most recent Audit-C alcohol use screening    05/05/2022    2:53 PM  Alcohol Use Disorder Test (AUDIT)  1. How often do you have a drink containing alcohol? 0  2. How many drinks containing alcohol do you have on a typical day when you are drinking? 0  3. How often do you have six or more drinks on one occasion? 0  AUDIT-C Score 0   A score of 3 or more in women, and 4 or more in men indicates increased risk for alcohol abuse, EXCEPT if all of the points are from question 1   EKG: Sinus  Tachycardia  -First degree A-V block  -With  rate variation  PRi = 224  cv = 18. Low voltage in precordial leads.   -Old inferior infarct.   Assessment & Plan     Annual wellness visit done today including the all of the following: Reviewed patient's Family Medical History Reviewed and updated list of patient's medical providers Assessment of cognitive impairment was done Assessed patient's functional ability Established a written schedule for health screening [place] Completed and Reviewed  Exercise Activities and Dietary recommendations  Goals      Exercise 3x  per week (30 min per time)     Recommend increasing exercise. Pt to start walking 3 days a week for 30 minutes.         Immunization History  Administered Date(s) Administered   Fluad Quad(high Dose 65+) 09/05/2019, 08/30/2020, 08/26/2021   Influenza Whole 10/18/2012   Influenza, High Dose Seasonal PF 10/13/2017, 10/22/2018   Influenza,inj,Quad PF,6+ Mos 08/30/2015   Influenza,inj,quad, With Preservative 09/17/2018   Influenza-Unspecified 08/30/2015   PFIZER(Purple Top)SARS-COV-2 Vaccination 02/08/2020, 03/03/2020, 11/17/2020   Pneumococcal Conjugate-13 11/17/2015   Pneumococcal Polysaccharide-23 02/01/2006, 05/11/2017   Tdap 05/11/2017, 08/02/2018   Zoster, Live 04/29/2012    Health Maintenance  Topic Date Due   FOOT EXAM  Never done   Zoster Vaccines- Shingrix (1 of 2) Never done   COVID-19 Vaccine (4 - Booster for Pfizer series) 01/12/2021   COLONOSCOPY (Pts 45-65yr Insurance coverage will need to be confirmed)  02/13/2021   OPHTHALMOLOGY EXAM  06/08/2022   INFLUENZA VACCINE  07/18/2022   HEMOGLOBIN A1C  11/05/2022   TETANUS/TDAP  08/02/2028   Pneumonia Vaccine 72 Years old  Completed   Hepatitis C Screening  Completed   HPV VACCINES  Aged Out     Discussed health benefits of physical activity, and encouraged him to engage in regular exercise appropriate for his age and condition.    1. Dizziness Likely orthostatic  but unable to perform orthostatic vitals as patient is unable to stand long enough to obtain. Is likely multifactorial as he had some mild dizziness associated with acute respiratory infection when he was seen on 05-05-2022. He doubled his valsartan-hctz dose and started tamsulosin in the morning of 05-06-2022 and BP Is dramatically lower today.  - EKG 12-Lead - no acute changes.  He was given several glasses of water to drink in the office in hopes that this would help with orthostasis. His BP did come up to 115/82, but he was still unable to sit up or stand without maximum assistance. He is unable to get himself to his car. He likely needs IV fluids and further work up of his respiratory symptoms. [facility]   2. Hypertension associated with diabetes ([place] He is to go back down to previous dose of valsartan-hctz when he is stabilized. Advised to stop tamsulosin and will evaluate his prostate once he is stable.   3. Subacute cough On day 3 of Levofloxacin prescribed on 05-05-2022.   4. Type 2 diabetes.  He stopped 48.0XKTrulicity due to GI side effects. He had no trouble with lower doses. When stable will resume lower dose of  Dulaglutide (TRULICITY) 05.53MZS/8.2LMSOPN; Inject 0.75 mg into the skin once a week.  Dispense: 2 mL; Refill: 3     The entirety of the information documented in the History of Present Illness, Review of Systems and Physical Exam were personally obtained by me. Portions of this information were initially documented by the CMA and reviewed by me for thoroughness and accuracy.     DLelon Huh MD  [phone](phone) [phone](fax)  [place]

## 2022-05-05 ENCOUNTER — Ambulatory Visit: Payer: Self-pay | Admitting: *Deleted

## 2022-05-05 ENCOUNTER — Encounter: Payer: Self-pay | Admitting: Family Medicine

## 2022-05-05 ENCOUNTER — Ambulatory Visit (INDEPENDENT_AMBULATORY_CARE_PROVIDER_SITE_OTHER): Payer: PPO | Admitting: Family Medicine

## 2022-05-05 VITALS — BP 155/81 | HR 91 | Temp 98.7°F | Resp 16 | Wt 304.8 lb

## 2022-05-05 DIAGNOSIS — R399 Unspecified symptoms and signs involving the genitourinary system: Secondary | ICD-10-CM | POA: Insufficient documentation

## 2022-05-05 DIAGNOSIS — R051 Acute cough: Secondary | ICD-10-CM | POA: Diagnosis not present

## 2022-05-05 DIAGNOSIS — E1159 Type 2 diabetes mellitus with other circulatory complications: Secondary | ICD-10-CM

## 2022-05-05 DIAGNOSIS — E119 Type 2 diabetes mellitus without complications: Secondary | ICD-10-CM | POA: Insufficient documentation

## 2022-05-05 DIAGNOSIS — E1149 Type 2 diabetes mellitus with other diabetic neurological complication: Secondary | ICD-10-CM | POA: Diagnosis not present

## 2022-05-05 DIAGNOSIS — I152 Hypertension secondary to endocrine disorders: Secondary | ICD-10-CM

## 2022-05-05 DIAGNOSIS — Z8546 Personal history of malignant neoplasm of prostate: Secondary | ICD-10-CM | POA: Diagnosis not present

## 2022-05-05 DIAGNOSIS — R5382 Chronic fatigue, unspecified: Secondary | ICD-10-CM | POA: Diagnosis not present

## 2022-05-05 DIAGNOSIS — I2510 Atherosclerotic heart disease of native coronary artery without angina pectoris: Secondary | ICD-10-CM

## 2022-05-05 DIAGNOSIS — R300 Dysuria: Secondary | ICD-10-CM | POA: Diagnosis not present

## 2022-05-05 LAB — POCT URINALYSIS DIPSTICK
Bilirubin, UA: NEGATIVE
Blood, UA: NEGATIVE
Glucose, UA: NEGATIVE
Ketones, UA: NEGATIVE
Leukocytes, UA: NEGATIVE
Nitrite, UA: NEGATIVE
Protein, UA: NEGATIVE
Spec Grav, UA: <=1.005 — AB (ref 1.010–1.025)
Urobilinogen, UA: 0.2 E.U./dL
pH, UA: 5.5 (ref 5.0–8.0)

## 2022-05-05 MED ORDER — VALSARTAN-HYDROCHLOROTHIAZIDE 160-25 MG PO TABS: 1.0000 | ORAL_TABLET | Freq: Every day | ORAL | 3 refills | Status: DC

## 2022-05-05 MED ORDER — PREDNISONE 10 MG (21) PO TBPK: ORAL_TABLET | ORAL | 0 refills | Status: DC

## 2022-05-05 MED ORDER — LEVOFLOXACIN 750 MG PO TABS
750.0000 mg | ORAL_TABLET | Freq: Every day | ORAL | 0 refills | Status: DC
Start: 2022-05-05 — End: 2022-05-29

## 2022-05-05 MED ORDER — GUAIFENESIN-CODEINE 100-10 MG/5ML PO SOLN: 10.0000 mL | Freq: Every evening | ORAL | 0 refills | Status: DC | PRN

## 2022-05-05 MED ORDER — TAMSULOSIN HCL 0.4 MG PO CAPS: 0.4000 mg | ORAL_CAPSULE | Freq: Every day | ORAL | 3 refills | Status: DC

## 2022-05-05 NOTE — Telephone Encounter (Signed)
Summary: cough, congestion and sore throat   Pt called in states has, cough, congestion and sore throat. He didn't want to make an appt. He is aking for something to be sent in to [id number] [place] Phone: [phone] Fax: [phone]     Reason for Disposition . [1] Continuous (nonstop) coughing interferes with work or school AND [2] no improvement using cough treatment per Care Advice  Answer Assessment - Initial Assessment Questions 1. ONSET: "When did the cough begin?"      Last 3 days 2. SEVERITY: "How bad is the cough today?"      Painful cough 3. SPUTUM: "Describe the color of your sputum" (none, dry cough; clear, white, yellow, green)     Not coughing sputum up 4. HEMOPTYSIS: "Are you coughing up any blood?" If so ask: "How much?" (flecks, streaks, tablespoons, etc.)     no 5. DIFFICULTY BREATHING: "Are you having difficulty breathing?" If Yes, ask: "How bad is it?" (e.g., mild, moderate, severe)    - MILD: No SOB at rest, mild SOB with walking, speaks normally in sentences, can lie down, no retractions, pulse < 100.    - MODERATE: SOB at rest, SOB with minimal exertion and prefers to sit, cannot lie down flat, speaks in phrases, mild retractions, audible wheezing, pulse 100-120.    - SEVERE: Very SOB at rest, speaks in single words, struggling to breathe, sitting hunched forward, retractions, pulse > 120      normal 6. FEVER: "Do you have a fever?" If Yes, ask: "What is your temperature, how was it measured, and when did it start?"     no 7. CARDIAC HISTORY: "Do you have any history of heart disease?" (e.g., heart attack, congestive heart failure)      *No Answer* 8. LUNG HISTORY: "Do you have any history of lung disease?"  (e.g., pulmonary embolus, asthma, emphysema)     *No Answer* 9. PE RISK FACTORS: "Do you have a history of blood clots?" (or: recent major surgery, recent prolonged travel, bedridden)     *No Answer* 10. OTHER  SYMPTOMS: "Do you have any other symptoms?" (e.g., runny nose, wheezing, chest pain)       Sore throat, congestion 11. PREGNANCY: "Is there any chance you are pregnant?" "When was your last menstrual period?"       *No Answer* 12. TRAVEL: "Have you traveled out of the country in the last month?" (e.g., travel history, exposures)       *No Answer*  Protocols used: Cough - Acute Productive-A-AH

## 2022-05-05 NOTE — Telephone Encounter (Signed)
  Chief Complaint: cough, congestion Symptoms: cough, congestion, sore throat, exposure to pneumonia- wife in hospital now  Frequency: 3 days Pertinent Negatives: Patient denies SOB Disposition: [] ED /[] Urgent Care (no appt availability in office) / [x] Appointment(In office/virtual)/ []  [place] [facility]/ [] Home Care/ [] Refused Recommended Disposition /[] [place] [facility]/ []  Follow-up with PCP Additional Notes:

## 2022-05-05 NOTE — Assessment & Plan Note (Signed)
Chronic, unstable Denies CP Endorses SOB/ DOE Denies low blood pressure/hypotension Denies vision changes +2 LE Edema noted on exam Increase medication,diovan 160-25 Denies side effects Follow up with PCP 4-6 wks  Seek emergent care if you develop chest pain or chest pressure

## 2022-05-05 NOTE — Progress Notes (Signed)
I,[name],acting as a Education administrator for Edward Sprout, FNP.,have documented all relevant documentation on the behalf of Edward Sprout, FNP,as directed by  Edward Sprout, FNP while in the presence of Edward Sprout, FNP.   Established patient visit   Patient: Edward King   DOB: Feb 19, 1951   71 y.o. Male  MRN: [id number] Visit Date: 05/05/2022  Today's healthcare provider: [name], FNP   Chief Complaint  Patient presents with   Cough  CC: cough Subjective    Upper respiratory symptoms He complains of right ear pressure/pain, cough described as productive, no  fever, and sore throat.with no fever, chills, night sweats or weight loss. Onset of symptoms was a few days ago and worsening.He is drinking plenty of fluids.  Past history is significant for pneumonia many yrs ago. Patient is non-smoker. Has taking nothing for symptoms.    Reports exposure to pneumonia-wife in hospital now.  ---------------------------------------------------------------------------------------------------  Medications: Outpatient Medications Prior to Visit  Medication Sig   buPROPion (WELLBUTRIN SR) 150 MG 12 hr tablet Take 150 mg by mouth daily.   citalopram (CELEXA) 40 MG tablet Take 40 mg by mouth daily.    Coenzyme Q10 (COQ-10) 100 MG CAPS Take 2,000 mg by mouth daily.   Cyanocobalamin (VITAMIN B-12 PO) Take 1,000 mcg by mouth daily at 6 (six) AM.   Dulaglutide (TRULICITY) 4.5 TK/1.6WF SOPN Inject 4.5 mg as directed once a week.   gabapentin (NEURONTIN) 300 MG capsule TAKE 2 CAPSULES BY MOUTH IN THE MORNING AND 3 CAPSULES AT BEDTIME   glucose blood (CONTOUR NEXT TEST) test strip Use as instructed to check blood sugar daily   meloxicam (MOBIC) 15 MG tablet Take 15 mg by mouth daily.   mesalamine (APRISO) 0.375 g 24 hr capsule Take 375 mg by mouth daily.   metFORMIN (GLUCOPHAGE-XR) 500 MG 24 hr tablet TAKE 2 TABLETS BY MOUTH TWICE A DAY   pravastatin (PRAVACHOL) 40 MG tablet TAKE 1 TABLET (40 MG  TOTAL) BY MOUTH DAILY WITH LUNCH.   Vitamin D, Cholecalciferol, 1000 units TABS Take 2,000 Units by mouth daily.   [DISCONTINUED] valsartan-hydrochlorothiazide (DIOVAN-HCT) 80-12.5 MG tablet TAKE 1 TABLET BY MOUTH EVERY DAY   mupirocin nasal ointment (BACTROBAN NASAL) 2 % Place 1 application into the nose 2 (two) times daily. Use one-half of tube in each nostril twice daily for five (5) days. After application, press sides of nose together and gently massage. (Patient not taking: Reported on 05/05/2022)   No facility-administered medications prior to visit.    Review of Systems     Objective    BP (!) 155/81 (BP Location: Right Arm, Patient Position: Sitting, Cuff Size: Normal)   Pulse 91   Temp 98.7 F (37.1 C) (Oral)   Resp 16   Wt (!) 304 lb 12.8 oz (138.3 kg)   SpO2 94%   BMI 42.51 kg/m    Physical Exam Vitals and nursing note reviewed.  Constitutional:      Appearance: Normal appearance. He is obese.  HENT:     Head: Normocephalic and atraumatic.     Right Ear: Tympanic membrane, ear canal and external ear normal. Decreased hearing noted.     Left Ear: Tympanic membrane, ear canal and external ear normal. Decreased hearing noted.     Nose: Nose normal.     Mouth/Throat:     Mouth: Mucous membranes are moist.     Pharynx: Oropharynx is clear. No oropharyngeal exudate or posterior oropharyngeal  erythema.  Eyes:     Pupils: Pupils are equal, round, and reactive to light.  Cardiovascular:     Rate and Rhythm: Normal rate and regular rhythm.     Pulses: Normal pulses.     Heart sounds: Normal heart sounds.  Pulmonary:     Effort: No accessory muscle usage, prolonged expiration or respiratory distress.     Breath sounds: Decreased air movement present. Examination of the right-lower field reveals decreased breath sounds. Examination of the left-lower field reveals decreased breath sounds. Decreased breath sounds present. No wheezing, rhonchi or rales.  Abdominal:      General: Bowel sounds are normal. There is no distension.     Palpations: Abdomen is soft.  Musculoskeletal:        General: Normal range of motion.     Cervical back: Normal range of motion.     Right lower leg: Edema present.     Left lower leg: Edema present.  Skin:    General: Skin is warm and dry.     Capillary Refill: Capillary refill takes less than 2 seconds.  Neurological:     General: No focal deficit present.     Mental Status: He is alert and oriented to person, place, and time. Mental status is at baseline.  Psychiatric:        Mood and Affect: Mood normal.        Behavior: Behavior normal.        Thought Content: Thought content normal.        Judgment: Judgment normal.     Results for orders placed or performed in visit on 05/05/22  POCT Urinalysis Dipstick  Result Value Ref Range   Color, UA     Clarity, UA clear    Glucose, UA Negative Negative   Bilirubin, UA neg    Ketones, UA neg    Spec Grav, UA <=1.005 (A) 1.010 - 1.025   Blood, UA neg    pH, UA 5.5 5.0 - 8.0   Protein, UA Negative Negative   Urobilinogen, UA 0.2 0.2 or 1.0 E.U./dL   Nitrite, UA neg    Leukocytes, UA Negative Negative   Appearance     Odor      Assessment & Plan     Problem List Items Addressed This Visit       Cardiovascular and Mediastinum   Coronary atherosclerosis    Chronic, stable Repeat FNLP        Relevant Medications   valsartan-hydrochlorothiazide (DIOVAN-HCT) 160-25 MG tablet   Other Relevant Orders   Lipid panel   Hypertension associated with diabetes (HCC)    Chronic, unstable Denies CP Endorses SOB/ DOE Denies low blood pressure/hypotension Denies vision changes +2 LE Edema noted on exam Increase medication,diovan 160-25 Denies side effects Follow up with PCP 4-6 wks  Seek emergent care if you develop chest pain or chest pressure       Relevant Medications   valsartan-hydrochlorothiazide (DIOVAN-HCT) 160-25 MG tablet     Endocrine   Diabetes  mellitus (HCC)    Chronic, repeat urine micro Complaints of excessive thirst and urinary symptoms in additional to changes in fatigue  Has not been compliant with trulicity d/t side effects of constipation as well as lack of availability- encouraged to let us know when he can not get medication        Relevant Medications   valsartan-hydrochlorothiazide (DIOVAN-HCT) 160-25 MG tablet   Other Relevant Orders   Hemoglobin A1c  Other   Cough - Primary    Acute on chronic, denies heart failure Reports that wife in hospital for pna Will treat with levaquin and steroids Has upcoming appt with PCP for close follow up        Relevant Medications   levofloxacin (LEVAQUIN) 750 MG tablet   predniSONE (STERAPRED UNI-PAK 21 TAB) 10 MG (21) TBPK tablet (Start on 05/06/2022)   guaiFENesin-codeine 100-10 MG/5ML syrup   Dysuria    Urinary frequency and hesitancy- negative on UA       Relevant Orders   Urine Microalbumin w/creat. ratio   POCT Urinalysis Dipstick (Completed)   Fatigue    Chronic, undiagnosed Check labs- TSH, CBC, CMP, A1c, Vitamin        Relevant Orders   CBC with Differential/Platelet   T4, free   TSH   Hemoglobin A1c   VITAMIN D 25 Hydroxy (Vit-D Deficiency, Fractures)   Vitamin B12   Comprehensive Metabolic Panel (CMET)   History of prostate cancer    Repeat PSA       Relevant Orders   PSA Total (Reflex To Free)   Lower urinary tract symptoms (LUTS)    Chronic, undiagnosed Trial of flomax Will plan to refer to urology if not improved in 1 month PSA completed        Relevant Medications   tamsulosin (FLOMAX) 0.4 MG CAPS capsule   Morbid obesity (HCC)    Chronic, stable Body mass index is 42.51 kg/m. Discussed importance of healthy weight management Discussed diet and exercise       Relevant Orders   CBC with Differential/Platelet   Hemoglobin A1c   Comprehensive Metabolic Panel (CMET)     Return in about 1 week (around 05/12/2022), or if  symptoms worsen or fail to improve, for annual examination.      Vonna Kotyk, FNP, have reviewed all documentation for this visit. The documentation on 05/05/22 for the exam, diagnosis, procedures, and orders are all accurate and complete.    Edward King, [place] [phone] (phone) [phone] (fax)  [place]

## 2022-05-05 NOTE — Assessment & Plan Note (Signed)
Chronic, repeat urine micro Complaints of excessive thirst and urinary symptoms in additional to changes in fatigue  Has not been compliant with trulicity d/t side effects of constipation as well as lack of availability- encouraged to let us know when he can not get medication

## 2022-05-05 NOTE — Assessment & Plan Note (Signed)
Chronic, stable Repeat FNLP

## 2022-05-05 NOTE — Assessment & Plan Note (Signed)
Repeat PSA

## 2022-05-05 NOTE — Assessment & Plan Note (Signed)
Chronic, undiagnosed Check labs- TSH, CBC, CMP, A1c, Vitamin

## 2022-05-05 NOTE — Assessment & Plan Note (Signed)
Urinary frequency and hesitancy- negative on UA

## 2022-05-05 NOTE — Assessment & Plan Note (Signed)
Chronic, undiagnosed Trial of flomax Will plan to refer to urology if not improved in 1 month PSA completed

## 2022-05-05 NOTE — Assessment & Plan Note (Signed)
Chronic, stable Body mass index is 42.51 kg/m. Discussed importance of healthy weight management Discussed diet and exercise

## 2022-05-05 NOTE — Assessment & Plan Note (Signed)
Acute on chronic, denies heart failure Reports that wife in hospital for pna Will treat with levaquin and steroids Has upcoming appt with PCP for close follow up

## 2022-05-06 LAB — CBC WITH DIFFERENTIAL/PLATELET
Basophils Absolute: 0 10*3/uL (ref 0.0–0.2)
Basos: 0 %
EOS (ABSOLUTE): 0.3 10*3/uL (ref 0.0–0.4)
Eos: 3 %
Hematocrit: 41.6 % (ref 37.5–51.0)
Hemoglobin: 13.6 g/dL (ref 13.0–17.7)
Immature Grans (Abs): 0 10*3/uL (ref 0.0–0.1)
Immature Granulocytes: 0 %
Lymphocytes Absolute: 1.3 10*3/uL (ref 0.7–3.1)
Lymphs: 14 %
MCH: 27.1 pg (ref 26.6–33.0)
MCHC: 32.7 g/dL (ref 31.5–35.7)
MCV: 83 fL (ref 79–97)
Monocytes Absolute: 0.7 10*3/uL (ref 0.1–0.9)
Monocytes: 7 %
Neutrophils Absolute: 7.2 10*3/uL — ABNORMAL HIGH (ref 1.4–7.0)
Neutrophils: 76 %
Platelets: 279 10*3/uL (ref 150–450)
RBC: 5.01 x10E6/uL (ref 4.14–5.80)
RDW: 14 % (ref 11.6–15.4)
WBC: 9.4 10*3/uL (ref 3.4–10.8)

## 2022-05-06 LAB — COMPREHENSIVE METABOLIC PANEL
ALT: 21 IU/L (ref 0–44)
AST: 22 IU/L (ref 0–40)
Albumin/Globulin Ratio: 1.4 (ref 1.2–2.2)
Albumin: 3.8 g/dL (ref 3.7–4.7)
Alkaline Phosphatase: 106 IU/L (ref 44–121)
BUN/Creatinine Ratio: 14 (ref 10–24)
BUN: 18 mg/dL (ref 8–27)
Bilirubin Total: 0.4 mg/dL (ref 0.0–1.2)
CO2: 27 mmol/L (ref 20–29)
Calcium: 9.1 mg/dL (ref 8.6–10.2)
Chloride: 100 mmol/L (ref 96–106)
Creatinine, Ser: 1.32 mg/dL — ABNORMAL HIGH (ref 0.76–1.27)
Globulin, Total: 2.7 g/dL (ref 1.5–4.5)
Glucose: 206 mg/dL — ABNORMAL HIGH (ref 70–99)
Potassium: 4.3 mmol/L (ref 3.5–5.2)
Sodium: 143 mmol/L (ref 134–144)
Total Protein: 6.5 g/dL (ref 6.0–8.5)
eGFR: 58 mL/min/{1.73_m2} — ABNORMAL LOW (ref >59)

## 2022-05-06 LAB — LIPID PANEL
Chol/HDL Ratio: 3.2 ratio (ref 0.0–5.0)
Cholesterol, Total: 157 mg/dL (ref 100–199)
HDL: 49 mg/dL (ref >39)
LDL Chol Calc (NIH): 81 mg/dL (ref 0–99)
Triglycerides: 157 mg/dL — ABNORMAL HIGH (ref 0–149)
VLDL Cholesterol Cal: 27 mg/dL (ref 5–40)

## 2022-05-06 LAB — TSH: TSH: 2.23 u[IU]/mL (ref 0.450–4.500)

## 2022-05-06 LAB — PSA TOTAL (REFLEX TO FREE): Prostate Specific Ag, Serum: <0.1 ng/mL (ref 0.0–4.0)

## 2022-05-06 LAB — VITAMIN B12: Vitamin B-12: 649 pg/mL (ref 232–1245)

## 2022-05-06 LAB — HEMOGLOBIN A1C
Est. average glucose Bld gHb Est-mCnc: 206 mg/dL
Hgb A1c MFr Bld: 8.8 % — ABNORMAL HIGH (ref 4.8–5.6)

## 2022-05-06 LAB — MICROALBUMIN / CREATININE URINE RATIO
Creatinine, Urine: 66.8 mg/dL
Microalb/Creat Ratio: 29 mg/g creat (ref 0–29)
Microalbumin, Urine: 19.3 ug/mL

## 2022-05-06 LAB — VITAMIN D 25 HYDROXY (VIT D DEFICIENCY, FRACTURES): Vit D, 25-Hydroxy: 35.1 ng/mL (ref 30.0–100.0)

## 2022-05-06 LAB — T4, FREE: Free T4: 1.24 ng/dL (ref 0.82–1.77)

## 2022-05-08 ENCOUNTER — Emergency Department
Admission: EM | Admit: 2022-05-08 | Discharge: 2022-05-08 | Discharge status: Against Medical Advice | Payer: PPO | Attending: Emergency Medicine | Admitting: Emergency Medicine

## 2022-05-08 ENCOUNTER — Encounter: Payer: Self-pay | Admitting: Family Medicine

## 2022-05-08 ENCOUNTER — Other Ambulatory Visit: Payer: Self-pay

## 2022-05-08 ENCOUNTER — Emergency Department: Payer: PPO

## 2022-05-08 ENCOUNTER — Ambulatory Visit (INDEPENDENT_AMBULATORY_CARE_PROVIDER_SITE_OTHER): Payer: PPO | Admitting: Family Medicine

## 2022-05-08 VITALS — BP 101/76 | HR 110 | Temp 98.5°F | Resp 18 | Wt 308.0 lb

## 2022-05-08 DIAGNOSIS — R0602 Shortness of breath: Secondary | ICD-10-CM | POA: Insufficient documentation

## 2022-05-08 DIAGNOSIS — R531 Weakness: Secondary | ICD-10-CM | POA: Insufficient documentation

## 2022-05-08 DIAGNOSIS — I959 Hypotension, unspecified: Secondary | ICD-10-CM | POA: Diagnosis not present

## 2022-05-08 DIAGNOSIS — R052 Subacute cough: Secondary | ICD-10-CM | POA: Diagnosis not present

## 2022-05-08 DIAGNOSIS — Z5321 Procedure and treatment not carried out due to patient leaving prior to being seen by health care provider: Secondary | ICD-10-CM | POA: Diagnosis not present

## 2022-05-08 DIAGNOSIS — I152 Hypertension secondary to endocrine disorders: Secondary | ICD-10-CM | POA: Diagnosis not present

## 2022-05-08 DIAGNOSIS — R42 Dizziness and giddiness: Secondary | ICD-10-CM | POA: Diagnosis not present

## 2022-05-08 DIAGNOSIS — Z0000 Encounter for general adult medical examination without abnormal findings: Secondary | ICD-10-CM | POA: Diagnosis not present

## 2022-05-08 DIAGNOSIS — E1159 Type 2 diabetes mellitus with other circulatory complications: Secondary | ICD-10-CM

## 2022-05-08 LAB — BASIC METABOLIC PANEL
Anion gap: 10 (ref 5–15)
BUN: 19 mg/dL (ref 8–23)
CO2: 30 mmol/L (ref 22–32)
Calcium: 8.9 mg/dL (ref 8.9–10.3)
Chloride: 98 mmol/L (ref 98–111)
Creatinine, Ser: 1.42 mg/dL — ABNORMAL HIGH (ref 0.61–1.24)
GFR, Estimated: 53 mL/min — ABNORMAL LOW (ref >60)
Glucose, Bld: 217 mg/dL — ABNORMAL HIGH (ref 70–99)
Potassium: 3.4 mmol/L — ABNORMAL LOW (ref 3.5–5.1)
Sodium: 138 mmol/L (ref 135–145)

## 2022-05-08 LAB — CBC
HCT: 42.3 % (ref 39.0–52.0)
Hemoglobin: 13.2 g/dL (ref 13.0–17.0)
MCH: 26.7 pg (ref 26.0–34.0)
MCHC: 31.2 g/dL (ref 30.0–36.0)
MCV: 85.6 fL (ref 80.0–100.0)
Platelets: 258 10*3/uL (ref 150–400)
RBC: 4.94 MIL/uL (ref 4.22–5.81)
RDW: 14.3 % (ref 11.5–15.5)
WBC: 7 10*3/uL (ref 4.0–10.5)
nRBC: 0 % (ref 0.0–0.2)

## 2022-05-08 MED ORDER — TRULICITY 0.75 MG/0.5ML ~~LOC~~ SOAJ: 0.7500 mg | SUBCUTANEOUS | 3 refills | Status: DC

## 2022-05-08 MED ORDER — VALSARTAN-HYDROCHLOROTHIAZIDE 80-12.5 MG PO TABS: 1.0000 | ORAL_TABLET | Freq: Every day | ORAL | Status: DC

## 2022-05-08 NOTE — ED Triage Notes (Signed)
Pt comes with c/o SOb. Pt states increased weakness and SOb. EMS reports hypotension.  BP-113/59 HR-111 O2- unable to obtain per EMS

## 2022-05-08 NOTE — Patient Instructions (Addendum)
Please review the attached list of medications and notify my office if there are any errors.   Stop taking tamsulosin (Flomax)  Go back down on the dose of valsartan-hctz to 80-12.37m once a day

## 2022-05-10 ENCOUNTER — Telehealth: Payer: Self-pay | Admitting: Family Medicine

## 2022-05-10 NOTE — Telephone Encounter (Signed)
Ok, his labs were mostly normal in the ER, but his sugar was high and he is a little dehydrated.  He is to stay off of the tamsulosin and just take 1/2 tablet of the 80/12.5 valsartan daily for the time being. Need to schedule follow up next week. Stay off the Trulicity for the tie being.

## 2022-05-10 NOTE — Telephone Encounter (Signed)
I called and spoke with patient. He reports the dizziness has slightly improved, but has not resolved. Patient reports that he still has some head and chest congestion. He was seen in the ER but left before being seen by a provider. Patient states he left the ER because he "felt so bad, and just wanted to go home".

## 2022-05-10 NOTE — Telephone Encounter (Signed)
Patient advised of Dr. Maralyn Sago message below and verbalized understanding. Follow up appointment scheduled for 05/16/2022 at 1:20pm.   Patient wants to know what Dr. Caryn Section recommends he take for the chest congestion and cough? Any OTC meds that he can try?

## 2022-05-10 NOTE — Telephone Encounter (Signed)
Please check with patient and see if dizziness is better since stopping the tamsulosin and cutting back on the dose of his BP medication. If so then he need to schedule a follow up in about 2-3 weeks regarding BP and urinary symptoms.

## 2022-05-11 NOTE — Telephone Encounter (Signed)
Tried calling patient. Left message to call back. OK for [facility] triage to advise.

## 2022-05-11 NOTE — Telephone Encounter (Signed)
He can take OTC guaifenesin (Mucinex) . I know he doesn't he like prednisone which was prescribed by Daneil Dan last week.. but even a low dose would probable help quite a bit. He could just take 2 a day (instead of 6 on the original prescription)

## 2022-05-12 NOTE — Telephone Encounter (Signed)
Patient called and advised of Dr. Maralyn Sago message below for OTC Mucinex and taking 2 Prednisone daily. Patient verbalized understanding. Advised ED if symptoms do not improve or worsen over the weekend, appointment already schedule for 05/16/22.

## 2022-05-16 ENCOUNTER — Ambulatory Visit: Payer: PPO | Admitting: Family Medicine

## 2022-05-16 NOTE — Progress Notes (Deleted)
      Established patient visit   Patient: Edward King   DOB: Aug 01, 1951   71 y.o. Male  MRN: [id number] Visit Date: 05/16/2022  Today's healthcare provider: Lelon Huh, MD   No chief complaint on file.  Subjective    HPI  Follow up ER visit  Patient was seen in ER for shortness of breath on 05/08/2022. He was treated for; increased weakness and SOb. EMS reports hypotension.. Treatment for this included; no notes in chart. He reports {DESC; EXCELLENT/GOOD/FAIR:19992} compliance with treatment. He reports this condition is {improved/worse/unchanged:3041574}.  -----------------------------------------------------------------------------------------   Medications: Outpatient Medications Prior to Visit  Medication Sig   buPROPion (WELLBUTRIN SR) 150 MG 12 hr tablet Take 150 mg by mouth daily.   citalopram (CELEXA) 40 MG tablet Take 40 mg by mouth daily.    Coenzyme Q10 (COQ-10) 100 MG CAPS Take 2,000 mg by mouth daily.   Cyanocobalamin (VITAMIN B-12 PO) Take 1,000 mcg by mouth daily at 6 (six) AM.   Dulaglutide (TRULICITY) 8.83 GP/4.9IY SOPN Inject 0.75 mg into the skin once a week.   gabapentin (NEURONTIN) 300 MG capsule TAKE 2 CAPSULES BY MOUTH IN THE MORNING AND 3 CAPSULES AT BEDTIME   glucose blood (CONTOUR NEXT TEST) test strip Use as instructed to check blood sugar daily   levofloxacin (LEVAQUIN) 750 MG tablet Take 1 tablet (750 mg total) by mouth daily.   meloxicam (MOBIC) 15 MG tablet Take 15 mg by mouth daily.   mesalamine (APRISO) 0.375 g 24 hr capsule Take 375 mg by mouth daily.   metFORMIN (GLUCOPHAGE-XR) 500 MG 24 hr tablet TAKE 2 TABLETS BY MOUTH TWICE A DAY   mupirocin nasal ointment (BACTROBAN NASAL) 2 % Place 1 application into the nose 2 (two) times daily. Use one-half of tube in each nostril twice daily for five (5) days. After application, press sides of nose together and gently massage.   pravastatin (PRAVACHOL) 40 MG tablet TAKE 1 TABLET (40 MG TOTAL) BY  MOUTH DAILY WITH LUNCH.   predniSONE (STERAPRED UNI-PAK 21 TAB) 10 MG (21) TBPK tablet Take as directed; recommended with morning meal.   valsartan-hydrochlorothiazide (DIOVAN-HCT) 80-12.5 MG tablet Take 1 tablet by mouth daily.   Vitamin D, Cholecalciferol, 1000 units TABS Take 2,000 Units by mouth daily.   No facility-administered medications prior to visit.    Review of Systems  Constitutional:  Negative for appetite change, chills and fever.  Respiratory:  Negative for chest tightness, shortness of breath and wheezing.   Cardiovascular:  Negative for chest pain and palpitations.  Gastrointestinal:  Negative for abdominal pain, nausea and vomiting.   {Labs  Heme  Chem  Endocrine  Serology  Results Review (optional):23779}   Objective    There were no vitals taken for this visit. {Show previous vital signs (optional):23777}  Physical Exam  ***  No results found for any visits on 05/16/22.  Assessment & Plan     ***  No follow-ups on file.      {provider attestation***:1}   Lelon Huh, MD  [facility] [phone] (phone) [phone] (fax)  [place]

## 2022-05-24 DIAGNOSIS — Z0289 Encounter for other administrative examinations: Secondary | ICD-10-CM | POA: Diagnosis not present

## 2022-05-26 ENCOUNTER — Other Ambulatory Visit: Payer: Self-pay | Admitting: Family Medicine

## 2022-05-26 DIAGNOSIS — F325 Major depressive disorder, single episode, in full remission: Secondary | ICD-10-CM | POA: Diagnosis not present

## 2022-05-26 DIAGNOSIS — I10 Essential (primary) hypertension: Secondary | ICD-10-CM | POA: Diagnosis not present

## 2022-05-26 DIAGNOSIS — Z6841 Body Mass Index (BMI) 40.0 and over, adult: Secondary | ICD-10-CM | POA: Diagnosis not present

## 2022-05-26 DIAGNOSIS — G619 Inflammatory polyneuropathy, unspecified: Secondary | ICD-10-CM | POA: Diagnosis not present

## 2022-05-26 DIAGNOSIS — E1142 Type 2 diabetes mellitus with diabetic polyneuropathy: Secondary | ICD-10-CM | POA: Diagnosis not present

## 2022-05-26 DIAGNOSIS — E1169 Type 2 diabetes mellitus with other specified complication: Secondary | ICD-10-CM | POA: Diagnosis not present

## 2022-05-26 DIAGNOSIS — G4733 Obstructive sleep apnea (adult) (pediatric): Secondary | ICD-10-CM | POA: Diagnosis not present

## 2022-05-26 DIAGNOSIS — Z7982 Long term (current) use of aspirin: Secondary | ICD-10-CM | POA: Diagnosis not present

## 2022-05-26 DIAGNOSIS — I25118 Atherosclerotic heart disease of native coronary artery with other forms of angina pectoris: Secondary | ICD-10-CM | POA: Diagnosis not present

## 2022-05-26 DIAGNOSIS — J069 Acute upper respiratory infection, unspecified: Secondary | ICD-10-CM | POA: Diagnosis not present

## 2022-05-26 DIAGNOSIS — Z8546 Personal history of malignant neoplasm of prostate: Secondary | ICD-10-CM | POA: Diagnosis not present

## 2022-05-26 NOTE — Telephone Encounter (Signed)
Requested Prescriptions  Pending Prescriptions Disp Refills  . pravastatin (PRAVACHOL) 40 MG tablet [Pharmacy Med Name: PRAVASTATIN SODIUM 40 MG TAB] 90 tablet 3    Sig: TAKE 1 TABLET (40 MG TOTAL) BY MOUTH DAILY WITH LUNCH.     Cardiovascular:  Antilipid - Statins Failed - 05/26/2022 10:43 AM      Failed - Lipid Panel in normal range within the last 12 months    Cholesterol, Total  Date Value Ref Range Status  05/05/2022 157 100 - 199 mg/dL Final   Cholesterol  Date Value Ref Range Status  08/30/2014 174 0 - 200 mg/dL Final   Ldl Cholesterol, Calc  Date Value Ref Range Status  08/30/2014 119 (H) 0 - 100 mg/dL Final   LDL Chol Calc (NIH)  Date Value Ref Range Status  05/05/2022 81 0 - 99 mg/dL Final   HDL Cholesterol  Date Value Ref Range Status  08/30/2014 40 40 - 60 mg/dL Final   HDL  Date Value Ref Range Status  05/05/2022 49 >39 mg/dL Final   Triglycerides  Date Value Ref Range Status  05/05/2022 157 (H) 0 - 149 mg/dL Final  08/30/2014 73 0 - 200 mg/dL Final         Passed - Patient is not pregnant      Passed - Valid encounter within last 12 months    Recent Outpatient Visits          2 weeks ago Kenton, Donald E, MD   3 weeks ago Acute cough   [facility] T[facility] Joe T, FNP   1 month ago Primary hypertension   [employer], [place], PA-C   9 months ago Type 2 diabetes mellitus with other neurologic complication, without long-term current use of insulin ([place])   [facility] Birdie Sons, MD   1 year ago Type 2 diabetes mellitus with other neurologic complication, without long-term current use of insulin [facility])   [facility] Birdie Sons, MD      Future Appointments            In 3 days Fisher, Kirstie Peri, MD [facility] [place], [place]

## 2022-05-29 ENCOUNTER — Ambulatory Visit (INDEPENDENT_AMBULATORY_CARE_PROVIDER_SITE_OTHER): Payer: PPO | Admitting: Family Medicine

## 2022-05-29 ENCOUNTER — Encounter: Payer: Self-pay | Admitting: Family Medicine

## 2022-05-29 VITALS — BP 130/79 | HR 94 | Temp 98.2°F | Resp 18 | Wt 306.0 lb

## 2022-05-29 DIAGNOSIS — I152 Hypertension secondary to endocrine disorders: Secondary | ICD-10-CM

## 2022-05-29 DIAGNOSIS — R42 Dizziness and giddiness: Secondary | ICD-10-CM

## 2022-05-29 DIAGNOSIS — R059 Cough, unspecified: Secondary | ICD-10-CM | POA: Diagnosis not present

## 2022-05-29 DIAGNOSIS — E1149 Type 2 diabetes mellitus with other diabetic neurological complication: Secondary | ICD-10-CM | POA: Diagnosis not present

## 2022-05-29 DIAGNOSIS — E1159 Type 2 diabetes mellitus with other circulatory complications: Secondary | ICD-10-CM

## 2022-05-29 MED ORDER — TRULICITY 0.75 MG/0.5ML ~~LOC~~ SOAJ: 0.7500 mg | SUBCUTANEOUS | 3 refills | Status: DC

## 2022-05-29 NOTE — Progress Notes (Addendum)
I,Roshena L Chambers,acting as a scribe for Lelon Huh, MD.,have documented all relevant documentation on the behalf of Lelon Huh, MD,as directed by  Lelon Huh, MD while in the presence of Lelon Huh, MD.   Established patient visit   Patient: Edward King   DOB: 1951/01/09   71 y.o. Male  MRN: [id number] Visit Date: 05/29/2022  Today's healthcare provider: Lelon Huh, MD   Chief Complaint  Patient presents with   Hypertension   Subjective    HPI  Hypertension, follow-up  BP Readings from Last 3 Encounters:  05/29/22 130/79  05/08/22 101/76  05/05/22 (!) 155/81   Wt Readings from Last 3 Encounters:  05/29/22 (!) 306 lb (138.8 kg)  05/08/22 (!) 308 lb (139.7 kg)  05/05/22 (!) 304 lb 12.8 oz (138.3 kg)     Was seen 05/08/22 with dizziness and hypotension associated with respiratory infection, compounded by recent doubling of valsartan-hctz and initiation of tamsulosin. On 05/10/2022, patient was advised  to stay off of tamsulosin and just take 1/2 tablet of the 80-12.63m valsartan-12.5 daily for the time being. He was also advised to stay off Trulicity for the time being. He had previously been on 4.536mwhich had been decreased to 0.75 due to constipation, although he has not yet filled the 0.75 dose.    He reports good compliance with treatment. Patient has been taking 1 full tablet of 80/12.5 valsartan-hctz daily. He is not having side effects.  He is following a Regular diet. He is not exercising. He does not smoke.  Use of agents associated with hypertension: none.   Outside blood pressures are checked occasionally. Symptoms: No chest pain No chest pressure  No palpitations No syncope  No dyspnea No orthopnea  No paroxysmal nocturnal dyspnea No lower extremity edema   Pertinent labs Lab Results  Component Value Date   CHOL 157 05/05/2022   HDL 49 05/05/2022   LDLCALC 81 05/05/2022   TRIG 157 (H) 05/05/2022   CHOLHDL 3.2 05/05/2022   Lab  Results  Component Value Date   NA 138 05/08/2022   K 3.4 (L) 05/08/2022   CREATININE 1.42 (H) 05/08/2022   GFRNONAA 53 (L) 05/08/2022   GLUCOSE 217 (H) 05/08/2022   TSH 2.230 05/05/2022     The 10-year ASCVD risk score (Arnett DK, et al., 2019) is: 36.1%  ---------------------------------------------------------------------------------------------------   Medications: Outpatient Medications Prior to Visit  Medication Sig   buPROPion (WELLBUTRIN SR) 150 MG 12 hr tablet Take 150 mg by mouth daily.   Coenzyme Q10 (COQ-10) 100 MG CAPS Take 100 mg by mouth daily.   Cyanocobalamin (VITAMIN B-12 PO) Take 1,000 mcg by mouth daily at 6 (six) AM.   Dulaglutide (TRULICITY) 0.1.19GER/7.4YCOPN Inject 0.75 mg into the skin once a week.   gabapentin (NEURONTIN) 300 MG capsule TAKE 2 CAPSULES BY MOUTH IN THE MORNING AND 3 CAPSULES AT BEDTIME   glucose blood (CONTOUR NEXT TEST) test strip Use as instructed to check blood sugar daily   meloxicam (MOBIC) 15 MG tablet Take 15 mg by mouth daily.   mesalamine (APRISO) 0.375 g 24 hr capsule Take 375 mg by mouth daily.   metFORMIN (GLUCOPHAGE-XR) 500 MG 24 hr tablet TAKE 2 TABLETS BY MOUTH TWICE A DAY   mirtazapine (REMERON) 30 MG tablet Take 30 mg by mouth at bedtime.   pravastatin (PRAVACHOL) 40 MG tablet TAKE 1 TABLET (40 MG TOTAL) BY MOUTH DAILY WITH LUNCH.   valsartan-hydrochlorothiazide (DIOVAN-HCT) 80-12.5 MG tablet  Take 1 tablet by mouth daily.   venlafaxine XR (EFFEXOR-XR) 150 MG 24 hr capsule Take 150 mg by mouth daily with breakfast.   Vitamin D, Cholecalciferol, 1000 units TABS Take 2,000 Units by mouth daily.   mupirocin nasal ointment (BACTROBAN NASAL) 2 % Place 1 application into the nose 2 (two) times daily. Use one-half of tube in each nostril twice daily for five (5) days. After application, press sides of nose together and gently massage. (Patient not taking: Reported on 05/29/2022)   [DISCONTINUED] citalopram (CELEXA) 40 MG tablet Take 40  mg by mouth daily.  (Patient not taking: Reported on 05/29/2022)   [DISCONTINUED] levofloxacin (LEVAQUIN) 750 MG tablet Take 1 tablet (750 mg total) by mouth daily. (Patient not taking: Reported on 05/29/2022)   [DISCONTINUED] predniSONE (STERAPRED UNI-PAK 21 TAB) 10 MG (21) TBPK tablet Take as directed; recommended with morning meal. (Patient not taking: Reported on 05/29/2022)   No facility-administered medications prior to visit.    Review of Systems  Constitutional:  Negative for appetite change, chills and fever.  HENT:  Positive for congestion.   Respiratory:  Positive for cough. Negative for chest tightness, shortness of breath and wheezing.   Cardiovascular:  Negative for chest pain and palpitations.  Gastrointestinal:  Negative for abdominal pain, nausea and vomiting.  Neurological:  Positive for dizziness and light-headedness. Negative for headaches.       Objective    BP 130/79 (BP Location: Right Arm, Patient Position: Sitting, Cuff Size: Large)   Pulse 94   Temp 98.2 F (36.8 C) (Oral)   Resp 18   Wt (!) 306 lb (138.8 kg)   SpO2 96% Comment: room air  BMI 42.68 kg/m    Physical Exam    General: Appearance:    Severely obese male in no acute distress  Eyes:    PERRL, conjunctiva/corneas clear, EOM's intact       Lungs:     Clear to auscultation bilaterally, respirations unlabored  Heart:    Normal heart rate. Normal rhythm. No murmurs, rubs, or gallops.    MS:   All extremities are intact.    Neurologic:   Awake, alert, oriented x 3. No apparent focal neurological defect.         Assessment & Plan     1. Cough, unspecified type Much improved, although not completely resolved. Advised to expect to continue to improve over the next 2-3 weeks and to call otherwise.   2. Dizziness Multifactorial in setting of respiratory infection and orthostatic hypotension. Now resolved after stopping tamsulosin and cutting valsartan-hctz back to previous dose.   3.  Hypertension associated with diabetes ([place]) Continue valsartain-hctz 80-12.5  4. Type 2 diabetes mellitus with other neurologic complication, without long-term current use of insulin (HCC) Off of Trulicity due to constipation which was not a problem until titrated up to 4.37m . Will resume at starting dose of 0.763mweekly and reassess in about 2 months.       The entirety of the information documented in the History of Present Illness, Review of Systems and Physical Exam were personally obtained by me. Portions of this information were initially documented by the CMA and reviewed by me for thoroughness and accuracy.     DoLelon HuhMD  [phone]) [phone])  [place]

## 2022-05-29 NOTE — Patient Instructions (Signed)
.   Please review the attached list of medications and notify my office if there are any errors.   . Please bring all of your medications to every appointment so we can make sure that our medication list is the same as yours.   

## 2022-05-30 ENCOUNTER — Other Ambulatory Visit: Payer: Self-pay

## 2022-05-30 ENCOUNTER — Emergency Department
Admission: EM | Admit: 2022-05-30 | Discharge: 2022-05-30 | Disposition: A | Discharge status: Home / Self Care | Payer: PPO | Attending: Emergency Medicine | Admitting: Emergency Medicine

## 2022-05-30 ENCOUNTER — Emergency Department: Payer: PPO

## 2022-05-30 ENCOUNTER — Ambulatory Visit: Payer: Self-pay | Admitting: *Deleted

## 2022-05-30 DIAGNOSIS — J9811 Atelectasis: Secondary | ICD-10-CM | POA: Diagnosis not present

## 2022-05-30 DIAGNOSIS — R531 Weakness: Secondary | ICD-10-CM | POA: Diagnosis not present

## 2022-05-30 DIAGNOSIS — R600 Localized edema: Secondary | ICD-10-CM | POA: Insufficient documentation

## 2022-05-30 DIAGNOSIS — Z8546 Personal history of malignant neoplasm of prostate: Secondary | ICD-10-CM | POA: Insufficient documentation

## 2022-05-30 DIAGNOSIS — E1165 Type 2 diabetes mellitus with hyperglycemia: Secondary | ICD-10-CM | POA: Insufficient documentation

## 2022-05-30 DIAGNOSIS — R42 Dizziness and giddiness: Secondary | ICD-10-CM | POA: Diagnosis not present

## 2022-05-30 DIAGNOSIS — R5383 Other fatigue: Secondary | ICD-10-CM | POA: Insufficient documentation

## 2022-05-30 DIAGNOSIS — I10 Essential (primary) hypertension: Secondary | ICD-10-CM | POA: Insufficient documentation

## 2022-05-30 DIAGNOSIS — R0602 Shortness of breath: Secondary | ICD-10-CM | POA: Diagnosis not present

## 2022-05-30 LAB — BRAIN NATRIURETIC PEPTIDE: B Natriuretic Peptide: 53.8 pg/mL (ref 0.0–100.0)

## 2022-05-30 LAB — CBC
HCT: 43 % (ref 39.0–52.0)
Hemoglobin: 13.4 g/dL (ref 13.0–17.0)
MCH: 26.7 pg (ref 26.0–34.0)
MCHC: 31.2 g/dL (ref 30.0–36.0)
MCV: 85.7 fL (ref 80.0–100.0)
Platelets: 226 10*3/uL (ref 150–400)
RBC: 5.02 MIL/uL (ref 4.22–5.81)
RDW: 15.1 % (ref 11.5–15.5)
WBC: 8.1 10*3/uL (ref 4.0–10.5)
nRBC: 0 % (ref 0.0–0.2)

## 2022-05-30 LAB — TROPONIN I (HIGH SENSITIVITY): Troponin I (High Sensitivity): 16 ng/L (ref <18)

## 2022-05-30 LAB — BASIC METABOLIC PANEL
Anion gap: 8 (ref 5–15)
BUN: 16 mg/dL (ref 8–23)
CO2: 30 mmol/L (ref 22–32)
Calcium: 9 mg/dL (ref 8.9–10.3)
Chloride: 102 mmol/L (ref 98–111)
Creatinine, Ser: 1.05 mg/dL (ref 0.61–1.24)
GFR, Estimated: >60 mL/min (ref >60)
Glucose, Bld: 275 mg/dL — ABNORMAL HIGH (ref 70–99)
Potassium: 4.2 mmol/L (ref 3.5–5.1)
Sodium: 140 mmol/L (ref 135–145)

## 2022-05-30 NOTE — Telephone Encounter (Signed)
Message from Erick Blinks sent at 05/30/2022 12:59 PM EDT  Summary: Norm Parcel, seeking advice   Pt is dehydrated and wants advice, feels dehydrated. Wants to know where he can go to receive fluid via IV.   Best contact: [phone]           Call History   Type Contact Phone/Fax User  05/30/2022 12:58 PM EDT Phone (Incoming) Scarantino, Elyn Aquas "Clair Gulling" (Self) [phone] Jerilynn Mages) Erick Blinks   Reason for Disposition  Nursing judgment or information in reference    Pt believes he is dehydrated and that going to the ED for IV fluids is what he needs to do.  Answer Assessment - Initial Assessment Questions 1. REASON FOR CALL: "What is your main concern right now?"     I need IV fluids because I think I'm dehydrated.   I saw Dr. Caryn Section last week and yesterday.   I am still feeling bad.   I'm wozzy when I stand up on my feet.   2. ONSET: "When did the dehydration start?"     I have chest congestion.   I'm not getting better.   Dr. Caryn Section said it would take a while for my chest to clear up.    I just needed to give it more time.   But the dehydration is getting me.   No diarrhea or vomiting. 3. SEVERITY: "How bad is the symptoms?"     I have head and chest congestion real bad.    When stand up I feel wobbly.   My head and eyes are congested.   My eyes feel like they are "graying out".   When standing up I can't stay up for long.    They took blood yesterday.   I have been sick for a week or more.   4. FEVER: "Do you have a fever?"     No fever.  Never had fever 5. OTHER SYMPTOMS: "Do you have any other new symptoms?"     Just a little green mucus coming out of my nose.   I'm coughing up stuff from my chest.   I'm taking a decongestant from OTC.    Mucinex is what I'm taking.    No chest pain or tightness.   My stomach muscles are tight from coughing so much.     6. TREATMENTS AND RESPONSE: "What have you done so far to try to make this better? What medicines have you used?"     I'm  drinking water.   Dr. Caryn Section said I'm dehydrated.    Have someone drive you to the ED.   Since you are wozzy don't drive.    7. PREGNANCY: "Is there any chance you are pregnant?" "When was your last menstrual period?"     N/A  Protocols used: No Guideline Available-A-AH

## 2022-05-30 NOTE — ED Provider Notes (Signed)
[facility] Provider Note    Event Date/Time   First MD Initiated Contact with Patient 05/30/22 1806     (approximate)   History   Dehydration   HPI  Edward King is a 71 y.o. male  with pmh DM, HTN, prostate cancer, who presents with generalized weakness.  Patient notes that over the last 2 to 3 weeks he generally feels more foggy sometimes occasionally lightheaded and fatigued.  He has had a cough that is minimally productive over this time.  He denies shortness of breath or chest pain.  Does occasionally feel lightheaded upon standing.  Feels okay when he wakes up but then gets more fatigued throughout the day.  He has chronic lower extremity edema no change with that.  Denies fevers chills headache.  No abdominal pain nausea vomiting.  Eating and drinking fine.  Has seen his primary doctor for this.     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  mostly left   Neuromuscular disorder ([place])    Prostate CA ([place]) 2016   Radiation seed implants.   Stroke [facility])    Temporal arteritis ([place])    Clinically dx Dr. Jefm Bryant. Negative temporal artrtery bx    Patient Active Problem List   Diagnosis Date Noted   Dysuria 05/05/2022   Diabetes mellitus ([place]) 05/05/2022   Lower urinary tract symptoms (LUTS) 05/05/2022   Hypertension associated with diabetes ([place]) 05/05/2022   History of MRSA infection 09/03/2020   Coronary atherosclerosis 01/24/2019   Morbid obesity ([place]) 08/27/2018   Bacteremia due to Gram-negative bacteria    Chronic colitis 03/15/2018   Hypersomnia 05/11/2017   Fatigue 05/11/2017   Multiple lung nodules on CT 02/29/2016   Hypogonadism in  male 06/16/2015   History of prostate cancer 06/16/2015   OSA (obstructive sleep apnea) 06/16/2015   Diabetes mellitus with neurological manifestation (HCC) 05/06/2014   Cough 02/28/2013   Peripheral edema 02/28/2013     Physical Exam  Triage Vital Signs: ED Triage Vitals [05/30/22 1518]  Enc Vitals Group     BP 140/89     Pulse Rate 100     Resp 18     Temp 98.1 F (36.7 C)     Temp Source Oral     SpO2 100 %     Weight (!) 308 lb (139.7 kg)     Height 5' 11"  (1.803 m)     Head Circumference      Peak Flow      Pain Score 0     Pain Loc      Pain Edu?      Excl. in [place]?     Most recent vital signs: Vitals:   05/30/22 1518 05/30/22 1810  BP: 140/89 (!) 151/86  Pulse: 100 85  Resp: 18 (!) 21  Temp: 98.1 F (36.7 C)   SpO2: 100% 98%     General: Awake, no distress.  CV:  Good peripheral perfusion.  Lymphedema bilaterally, symmetric Resp:  Normal effort.  Increased work of breathing lung sounds are clear Abd:  No distention. Neuro:             Awake, Alert, Oriented x 3  Other:     ED Results / Procedures / Treatments  Labs (all labs ordered are listed, but only abnormal results are displayed) Labs Reviewed  BASIC METABOLIC PANEL - Abnormal; Notable for the following components:      Result Value   Glucose, Bld 275 (*)    All other components within normal limits  CBC  BRAIN NATRIURETIC PEPTIDE  URINALYSIS, ROUTINE W REFLEX MICROSCOPIC  CBG MONITORING, ED  TROPONIN I (HIGH SENSITIVITY)     EKG  EKG interpreted by myself shows sinus rhythm with PACs, incomplete right bundle branch block poor R wave progression no acute ischemic changes   RADIOLOGY I reviewed and interpreted the CXR which does not show any acute cardiopulmonary process    PROCEDURES:  Critical Care performed: No  .1-3 Lead EKG Interpretation  Performed by: Rada Hay, MD Authorized by: Rada Hay, MD     Interpretation: normal     ECG rate assessment:  normal     Rhythm: sinus rhythm     Ectopy: none     Conduction: normal     The patient is on the cardiac monitor to evaluate for evidence of arrhythmia and/or significant heart rate changes.   MEDICATIONS ORDERED IN ED: Medications - No data to display   IMPRESSION / MDM / [place] / ED COURSE  I reviewed the triage vital signs and the nursing notes.                              Patient's presentation is most consistent with acute presentation with potential threat to life or bodily function.  Differential diagnosis includes, but is not limited to, anemia, infection, pneumonia, dehydration, orthostatic hypotension, ACS, heart failure, arrhythmia  Patient is a 71 year old male who presents with generalized fatigue weakness occasional lightheadedness.  The symptoms been going on for about 2 to 3 weeks.  He has no symptoms of ischemia including no chest pain shortness of breath exertional dyspnea.  Occasionally feels lightheaded upon standing also tells me that he generally feels worse throughout the day when he wakes up has more energy but then gets more fatigued.  He has had a cough that is minimally productive no fevers eating and drinking fine no weight loss.  Vitals are within normal limits other than respiratory rate of 21 documented however he appears comfortable with normal respiratory rate on my exam.  He has chronic lower extremity edema lungs are clear.  His EKG shows an incomplete right bundle branch block no acute ischemic changes.  CBC is without anemia or leukocytosis.  He is mildly hyperglycemic to 225 but there is no signs of DKA on labs.  I did check a troponin and a BNP to screen for ACS and heart failure as cause of his new weakness and troponin is negative.  Ultimately with his stable vital signs and no suspicion for acute life threatening process I think he is appropriate for discharge and outpatient follow-up.  He has follow-up both with cardiology and his  PCP.   Clinical Course as of 05/30/22 1944  Tue May 30, 2022  1938 Potassium: 4.2 [KM]    Clinical Course User Index [KM] Rada Hay, MD     FINAL CLINICAL IMPRESSION(S) / ED DIAGNOSES   Final diagnoses:  Weakness  Rx / DC Orders   ED Discharge Orders     None        Note:  This document was prepared using Dragon voice recognition software and may include unintentional dictation errors.   Rada Hay, MD 05/30/22 1944

## 2022-05-30 NOTE — Telephone Encounter (Signed)
  Chief Complaint: Pt feels he is dehydrated and needs IV fluids.   Saw Dr. Caryn Section yesterday for dizziness and coughing, URI symptoms. Symptoms: Head and chest congestion, feels bad.  Doesn't feel he is getting any better and that IV fluids would help. Frequency: C/o feeling "wobbly" when he gets up from sitting and can't stand for very long due to feeling "wobbly" Pertinent Negatives: Patient denies having diarrhea, vomiting or poor appetite.   He is drinking water and [employer]. Disposition: [x] ED /[] Urgent Care (no appt availability in office) / [] Appointment(In office/virtual)/ []  [place] [facility]/ [] Home Care/ [] Refused Recommended Disposition /[] [place] [facility]/ []  Follow-up with PCP Additional Notes: Pt's decision to go to ED for IV fluids.   I offered him an appt but he did not want to do that since he was seen yesterday by Dr. Caryn Section and not feeling any better.

## 2022-05-30 NOTE — ED Triage Notes (Signed)
Pt sent here from primary with dehydration and weakness. Pt feels like "I am going to fall any minute", c/o dizziness. Pt denies pain, N/V/D.

## 2022-05-30 NOTE — Discharge Instructions (Addendum)
Wearing aYour blood work was all reassuring today including your cardiac enzymes blood counts and electrolytes.  Your chest x-ray did not show any signs of pneumonia. It is important that you follow-up both with your primary care provider and your cardiologist.

## 2022-06-02 DIAGNOSIS — I10 Essential (primary) hypertension: Secondary | ICD-10-CM | POA: Diagnosis not present

## 2022-06-02 DIAGNOSIS — E119 Type 2 diabetes mellitus without complications: Secondary | ICD-10-CM | POA: Diagnosis not present

## 2022-06-02 DIAGNOSIS — Z8719 Personal history of other diseases of the digestive system: Secondary | ICD-10-CM | POA: Diagnosis not present

## 2022-06-02 DIAGNOSIS — R609 Edema, unspecified: Secondary | ICD-10-CM | POA: Diagnosis not present

## 2022-06-02 DIAGNOSIS — I208 Other forms of angina pectoris: Secondary | ICD-10-CM | POA: Diagnosis not present

## 2022-06-02 DIAGNOSIS — G4730 Sleep apnea, unspecified: Secondary | ICD-10-CM | POA: Diagnosis not present

## 2022-06-02 DIAGNOSIS — R0602 Shortness of breath: Secondary | ICD-10-CM | POA: Diagnosis not present

## 2022-06-02 DIAGNOSIS — Z6841 Body Mass Index (BMI) 40.0 and over, adult: Secondary | ICD-10-CM | POA: Diagnosis not present

## 2022-06-02 DIAGNOSIS — G4733 Obstructive sleep apnea (adult) (pediatric): Secondary | ICD-10-CM | POA: Diagnosis not present

## 2022-06-02 DIAGNOSIS — M316 Other giant cell arteritis: Secondary | ICD-10-CM | POA: Diagnosis not present

## 2022-06-02 DIAGNOSIS — R42 Dizziness and giddiness: Secondary | ICD-10-CM | POA: Diagnosis not present

## 2022-06-14 DIAGNOSIS — H35713 Central serous chorioretinopathy, bilateral: Secondary | ICD-10-CM | POA: Diagnosis not present

## 2022-06-14 LAB — HM DIABETES EYE EXAM

## 2022-06-22 DIAGNOSIS — I208 Other forms of angina pectoris: Secondary | ICD-10-CM | POA: Diagnosis not present

## 2022-06-22 DIAGNOSIS — R0602 Shortness of breath: Secondary | ICD-10-CM | POA: Diagnosis not present

## 2022-06-23 DIAGNOSIS — I208 Other forms of angina pectoris: Secondary | ICD-10-CM | POA: Diagnosis not present

## 2022-06-23 DIAGNOSIS — H34232 Retinal artery branch occlusion, left eye: Secondary | ICD-10-CM | POA: Diagnosis not present

## 2022-06-23 DIAGNOSIS — R0602 Shortness of breath: Secondary | ICD-10-CM | POA: Diagnosis not present

## 2022-06-23 DIAGNOSIS — H2511 Age-related nuclear cataract, right eye: Secondary | ICD-10-CM | POA: Diagnosis not present

## 2022-06-26 ENCOUNTER — Encounter: Payer: Self-pay | Admitting: Ophthalmology

## 2022-06-28 DIAGNOSIS — R0602 Shortness of breath: Secondary | ICD-10-CM | POA: Diagnosis not present

## 2022-06-28 DIAGNOSIS — I208 Other forms of angina pectoris: Secondary | ICD-10-CM | POA: Diagnosis not present

## 2022-06-28 DIAGNOSIS — G4733 Obstructive sleep apnea (adult) (pediatric): Secondary | ICD-10-CM | POA: Diagnosis not present

## 2022-06-30 NOTE — Discharge Instructions (Signed)

## 2022-07-03 ENCOUNTER — Encounter
Admission: RE | Disposition: A | Discharge status: Home / Self Care | Payer: Self-pay | Source: Home / Self Care | Attending: Ophthalmology

## 2022-07-03 ENCOUNTER — Other Ambulatory Visit: Payer: Self-pay

## 2022-07-03 ENCOUNTER — Ambulatory Visit
Admission: RE | Admit: 2022-07-03 | Discharge: 2022-07-03 | Disposition: A | Discharge status: Home / Self Care | Payer: PPO | Attending: Ophthalmology | Admitting: Ophthalmology

## 2022-07-03 ENCOUNTER — Encounter: Payer: Self-pay | Admitting: Ophthalmology

## 2022-07-03 ENCOUNTER — Ambulatory Visit: Payer: PPO | Admitting: Anesthesiology

## 2022-07-03 DIAGNOSIS — M1990 Unspecified osteoarthritis, unspecified site: Secondary | ICD-10-CM | POA: Diagnosis not present

## 2022-07-03 DIAGNOSIS — E1136 Type 2 diabetes mellitus with diabetic cataract: Secondary | ICD-10-CM | POA: Diagnosis not present

## 2022-07-03 DIAGNOSIS — H2511 Age-related nuclear cataract, right eye: Secondary | ICD-10-CM | POA: Insufficient documentation

## 2022-07-03 DIAGNOSIS — E119 Type 2 diabetes mellitus without complications: Secondary | ICD-10-CM | POA: Diagnosis not present

## 2022-07-03 DIAGNOSIS — Z6841 Body Mass Index (BMI) 40.0 and over, adult: Secondary | ICD-10-CM | POA: Insufficient documentation

## 2022-07-03 DIAGNOSIS — G4730 Sleep apnea, unspecified: Secondary | ICD-10-CM | POA: Diagnosis not present

## 2022-07-03 DIAGNOSIS — I10 Essential (primary) hypertension: Secondary | ICD-10-CM | POA: Diagnosis not present

## 2022-07-03 DIAGNOSIS — I2510 Atherosclerotic heart disease of native coronary artery without angina pectoris: Secondary | ICD-10-CM | POA: Diagnosis not present

## 2022-07-03 DIAGNOSIS — I639 Cerebral infarction, unspecified: Secondary | ICD-10-CM | POA: Diagnosis not present

## 2022-07-03 HISTORY — PX: CATARACT EXTRACTION W/PHACO: SHX586

## 2022-07-03 HISTORY — DX: Presence of external hearing-aid: Z97.4

## 2022-07-03 LAB — GLUCOSE, CAPILLARY
Glucose-Capillary: 202 mg/dL — ABNORMAL HIGH (ref 70–99)
Glucose-Capillary: 178 mg/dL — ABNORMAL HIGH (ref 70–99)

## 2022-07-03 SURGERY — PHACOEMULSIFICATION, CATARACT, WITH IOL INSERTION
Anesthesia: Monitor Anesthesia Care | Site: Eye | Laterality: Right

## 2022-07-03 MED ORDER — MOXIFLOXACIN HCL 0.5 % OP SOLN
OPHTHALMIC | Status: DC | PRN
  Administered 2022-07-03: 0.2 mL via OPHTHALMIC

## 2022-07-03 MED ORDER — MIDAZOLAM HCL 2 MG/2ML IJ SOLN
INTRAMUSCULAR | Status: DC | PRN
  Administered 2022-07-03 (×2): 1 mg via INTRAVENOUS

## 2022-07-03 MED ORDER — SIGHTPATH DOSE#1 BSS IO SOLN
INTRAOCULAR | Status: DC | PRN
  Administered 2022-07-03: 65 mL via OPHTHALMIC

## 2022-07-03 MED ORDER — ARMC OPHTHALMIC DILATING DROPS
1.0000 | OPHTHALMIC | Status: DC | PRN
  Administered 2022-07-03 (×3): 1 via OPHTHALMIC

## 2022-07-03 MED ORDER — SIGHTPATH DOSE#1 BSS IO SOLN
INTRAOCULAR | Status: DC | PRN
  Administered 2022-07-03: 15 mL

## 2022-07-03 MED ORDER — FENTANYL CITRATE (PF) 100 MCG/2ML IJ SOLN
INTRAMUSCULAR | Status: DC | PRN
  Administered 2022-07-03 (×2): 50 ug via INTRAVENOUS

## 2022-07-03 MED ORDER — ACETAMINOPHEN 160 MG/5ML PO SOLN: 975.0000 mg | Freq: Once | ORAL | Status: DC | PRN

## 2022-07-03 MED ORDER — ONDANSETRON HCL 4 MG/2ML IJ SOLN: 4.0000 mg | Freq: Once | INTRAMUSCULAR | Status: DC | PRN

## 2022-07-03 MED ORDER — TETRACAINE HCL 0.5 % OP SOLN
1.0000 [drp] | OPHTHALMIC | Status: DC | PRN
  Administered 2022-07-03 (×3): 1 [drp] via OPHTHALMIC

## 2022-07-03 MED ORDER — ACETAMINOPHEN 500 MG PO TABS: 1000.0000 mg | ORAL_TABLET | Freq: Once | ORAL | Status: DC | PRN

## 2022-07-03 MED ORDER — LACTATED RINGERS IV SOLN: INTRAVENOUS | Status: DC

## 2022-07-03 MED ORDER — LIDOCAINE HCL (PF) 2 % IJ SOLN
INTRAOCULAR | Status: DC | PRN
  Administered 2022-07-03: 1 mL via INTRAOCULAR

## 2022-07-03 MED ORDER — SIGHTPATH DOSE#1 NA HYALUR & NA CHOND-NA HYALUR IO KIT
PACK | INTRAOCULAR | Status: DC | PRN
  Administered 2022-07-03: 1 via OPHTHALMIC

## 2022-07-03 SURGICAL SUPPLY — 14 items
CATARACT SUITE SIGHTPATH (MISCELLANEOUS) ×2 IMPLANT
DISSECTOR HYDRO NUCLEUS 50X22 (MISCELLANEOUS) ×2 IMPLANT
FEE CATARACT SUITE SIGHTPATH (MISCELLANEOUS) ×1 IMPLANT
GLOVE SURG GAMMEX PI TX LF 7.5 (GLOVE) ×2 IMPLANT
GLOVE SURG SYN 8.5  E (GLOVE) ×1
GLOVE SURG SYN 8.5 E (GLOVE) ×1 IMPLANT
GLOVE SURG SYN 8.5 PF PI (GLOVE) ×1 IMPLANT
LENS IOL TECNIS EYHANCE 22.5 (Intraocular Lens) ×1 IMPLANT
NDL FILTER BLUNT 18X1 1/2 (NEEDLE) ×1 IMPLANT
NEEDLE FILTER BLUNT 18X 1/2SAF (NEEDLE) ×1
NEEDLE FILTER BLUNT 18X1 1/2 (NEEDLE) ×1 IMPLANT
SYR 3ML LL SCALE MARK (SYRINGE) ×2 IMPLANT
SYR 5ML LL (SYRINGE) ×2 IMPLANT
WATER STERILE IRR 250ML POUR (IV SOLUTION) ×2 IMPLANT

## 2022-07-03 NOTE — Transfer of Care (Signed)
Immediate Anesthesia Transfer of Care Note  Patient: Edward King  Procedure(s) Performed: CATARACT EXTRACTION PHACO AND INTRAOCULAR LENS PLACEMENT (IOC) RIGHT (Right: Eye)  Patient Location: PACU  Anesthesia Type: MAC  Level of Consciousness: awake, alert  and patient cooperative  Airway and Oxygen Therapy: Patient Spontanous Breathing and Patient connected to supplemental oxygen  Post-op Assessment: Post-op Vital signs reviewed, Patient's Cardiovascular Status Stable, Respiratory Function Stable, Patent Airway and No signs of Nausea or vomiting  Post-op Vital Signs: Reviewed and stable  Complications: No notable events documented.

## 2022-07-03 NOTE — Anesthesia Preprocedure Evaluation (Signed)
Anesthesia Evaluation  Patient identified by MRN, date of birth, ID band Patient awake    Reviewed: Allergy & Precautions, NPO status , Patient's Chart, lab work & pertinent test results  Airway Mallampati: III  TM Distance: >3 FB Neck ROM: Full    Dental no notable dental hx.    Pulmonary sleep apnea ,    Pulmonary exam normal        Cardiovascular hypertension, + CAD  Normal cardiovascular exam     Neuro/Psych CVA    GI/Hepatic negative GI ROS, Neg liver ROS,   Endo/Other  diabetes, Type 2Morbid obesity (BMI 42)  Renal/GU      Musculoskeletal  (+) Arthritis ,   Abdominal (+) + obese,   Peds  Hematology H/o prostate cancer   Anesthesia Other Findings   Reproductive/Obstetrics                             Anesthesia Physical Anesthesia Plan  ASA: 3  Anesthesia Plan: MAC   Post-op Pain Management:    Induction: Intravenous  PONV Risk Score and Plan: 1 and TIVA, Midazolam and Treatment may vary due to age or medical condition  Airway Management Planned: Nasal Cannula and Natural Airway  Additional Equipment:   Intra-op Plan:   Post-operative Plan:   Informed Consent: I have reviewed the patients History and Physical, chart, labs and discussed the procedure including the risks, benefits and alternatives for the proposed anesthesia with the patient or authorized representative who has indicated his/her understanding and acceptance.     Dental advisory given  Plan Discussed with: CRNA  Anesthesia Plan Comments:         Anesthesia Quick Evaluation

## 2022-07-03 NOTE — Anesthesia Postprocedure Evaluation (Signed)
Anesthesia Post Note  Patient: Edward King  Procedure(s) Performed: CATARACT EXTRACTION PHACO AND INTRAOCULAR LENS PLACEMENT (IOC) RIGHT (Right: Eye)     Patient location during evaluation: PACU Anesthesia Type: MAC Level of consciousness: awake and alert Pain management: pain level controlled Vital Signs Assessment: post-procedure vital signs reviewed and stable Respiratory status: spontaneous breathing and nonlabored ventilation Cardiovascular status: blood pressure returned to baseline Postop Assessment: no apparent nausea or vomiting Anesthetic complications: no   No notable events documented.  Ryn Peine [employer]

## 2022-07-03 NOTE — Op Note (Signed)
OPERATIVE NOTE  Edward King [id number] 07/03/2022   PREOPERATIVE DIAGNOSIS:  Nuclear sclerotic cataract right eye.  H25.11   POSTOPERATIVE DIAGNOSIS:    Nuclear sclerotic cataract right eye.     PROCEDURE:  Phacoemusification with posterior chamber intraocular lens placement of the right eye   LENS:   Implant Name Type Inv. Item Serial No. Manufacturer Lot No. LRB No. Used Action  LENS IOL TECNIS EYHANCE 22.5 - [id number] Intraocular Lens LENS IOL TECNIS EYHANCE 22.5 [id number] [employer]  Right 1 Implanted       Procedure(s) with comments: CATARACT EXTRACTION PHACO AND INTRAOCULAR LENS PLACEMENT (IOC) RIGHT (Right) - Diabetic 2.36 00:18.8  DIB00 +22.5   ULTRASOUND TIME: 0 minutes 18 seconds.  CDE 2.36   SURGEON:  Benay Pillow, MD, MPH  ANESTHESIOLOGIST: Anesthesiologist: Ardeth Sportsman, MD CRNA: Izetta Dakin, CRNA   ANESTHESIA:  Topical with tetracaine drops augmented with 1% preservative-free intracameral lidocaine.  ESTIMATED BLOOD LOSS: less than 1 mL.   COMPLICATIONS:  None.   DESCRIPTION OF PROCEDURE:  The patient was identified in the holding room and transported to the operating room and placed in the supine position under the operating microscope.  The right eye was identified as the operative eye and it was prepped and draped in the usual sterile ophthalmic fashion.   A 1.0 millimeter clear-corneal paracentesis was made at the 10:30 position. 0.5 ml of preservative-free 1% lidocaine with epinephrine was injected into the anterior chamber.  The anterior chamber was filled with viscoelastic.  A 2.4 millimeter keratome was used to make a near-clear corneal incision at the 8:00 position.  A curvilinear capsulorrhexis was made with a cystotome and capsulorrhexis forceps.  Balanced salt solution was used to hydrodissect and hydrodelineate the nucleus.   Phacoemulsification was then used in stop and chop fashion to remove the lens nucleus and epinucleus.  The  remaining cortex was then removed using the irrigation and aspiration handpiece. Viscoelastic was then placed into the capsular bag to distend it for lens placement.  A lens was then injected into the capsular bag.  The remaining viscoelastic was aspirated.   Wounds were hydrated with balanced salt solution.  The anterior chamber was inflated to a physiologic pressure with balanced salt solution.   Intracameral vigamox 0.1 mL undiluted was injected into the eye and a drop placed onto the ocular surface.  No wound leaks were noted.  The patient was taken to the recovery room in stable condition without complications of anesthesia or surgery  Benay Pillow 07/03/2022, 8:22 AM

## 2022-07-03 NOTE — H&P (Signed)
[facility]   Primary Care Physician:  Birdie Sons, MD Ophthalmologist: Dr. Benay Pillow  Pre-Procedure History & Physical: HPI:  Edward King is a 71 y.o. male here for cataract surgery.   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  mostly left   Neuromuscular disorder ([place])    Prostate CA ([place]) 2016   Radiation seed implants.   Stroke [facility])    Temporal arteritis ([place])    Clinically dx Dr. Jefm Bryant. Negative temporal artrtery bx   Wears hearing aid in both ears     Past Surgical History:  Procedure Laterality Date   ABDOMINOPLASTY     Carotid Doppler Ultrasound  08/22/2014   [facility]; Minimal plaque right. Minimal thisckening left Anterograde flow vertebrals   COLONOSCOPY WITH PROPOFOL N/A 02/13/2018   Procedure: COLONOSCOPY WITH PROPOFOL;  Surgeon: Manya Silvas, MD;  Location: [facility] ENDOSCOPY;  Service: Endoscopy;  Laterality: N/A;   CT of the head  08/22/2014   [facility]. Normal   DOPPLER ECHOCARDIOGRAPHY  08/29/2014   EF=60-65%. Normal LVEF   Myocardial Perfusion Scan  09/10/2012   Poor exercsie tolerance, but no evidence of stress induced myocardial ischemia. EF=66%. Dr. Clayborn Bigness   NASAL SEPTOPLASTY W/ TURBINOPLASTY Bilateral 02/10/2016   Procedure: NASAL SEPTOPLASTY WITH INFERIOR TURBINATE REDUCTION;  Surgeon: Carloyn Manner, MD;  Location: [facility] ORS;  Service: ENT;  Laterality: Bilateral;   pannectomy     TEMPORAL ARTERY BIOPSY / LIGATION Left     Prior to Admission medications   Medication Sig Start Date End Date Taking? Authorizing Provider  buPROPion (WELLBUTRIN SR) 150 MG 12 hr tablet Take 150 mg by mouth daily.   Yes [provider]  Coenzyme  Q10 (COQ-10) 100 MG CAPS Take 100 mg by mouth daily.   Yes [provider]  Cyanocobalamin (VITAMIN B-12 PO) Take 1,000 mcg by mouth daily at 6 (six) AM.   Yes [provider]  Dulaglutide (TRULICITY) 5.05 LZ/7.6BH SOPN Inject 0.75 mg into the skin once a week. 05/29/22  Yes Birdie Sons, MD  gabapentin (NEURONTIN) 300 MG capsule TAKE 2 CAPSULES BY MOUTH IN THE MORNING AND 3 CAPSULES AT BEDTIME 06/24/21  Yes Birdie Sons, MD  metFORMIN (GLUCOPHAGE-XR) 500 MG 24 hr tablet TAKE 2 TABLETS BY MOUTH TWICE A DAY 02/19/22  Yes Birdie Sons, MD  mirtazapine (REMERON) 30 MG tablet Take 30 mg by mouth at bedtime.   Yes [provider]  pravastatin (PRAVACHOL) 40 MG tablet TAKE 1 TABLET (40 MG TOTAL) BY MOUTH DAILY WITH LUNCH. 05/26/22  Yes Birdie Sons, MD  valsartan-hydrochlorothiazide (DIOVAN-HCT) 80-12.5 MG tablet Take 1 tablet by mouth daily. 05/08/22  Yes Birdie Sons, MD  venlafaxine XR (EFFEXOR-XR) 150 MG 24 hr capsule Take 150 mg by mouth daily with breakfast.   Yes [provider]  Vitamin D, Cholecalciferol, 1000 units TABS Take 2,000 Units by mouth daily.   Yes [provider]  glucose blood (CONTOUR NEXT TEST) test strip Use as instructed to check blood sugar daily 10/22/18   Birdie Sons, MD  meloxicam (MOBIC) 15 MG tablet Take 15 mg by mouth daily. Patient not taking: Reported on 06/26/2022 08/24/19   [provider]  mesalamine (APRISO) 0.375 g 24 hr capsule Take 375 mg by mouth daily. 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, 2017  Tobacco Use   Smoking status: Never   Smokeless tobacco: Never  Vaping Use   Vaping Use: Never used  Substance and Sexual Activity   Alcohol use: Yes    Alcohol/week: 0.0 standard drinks of alcohol    Comment: Rare - 1 drink   Drug use: No   Sexual activity: Not on file  Other Topics Concern   Not on file  Social History Narrative   Not on file   Social Determinants of Health   Financial Resource Strain: Low Risk  (03/14/2021)   Overall Financial Resource Strain (CARDIA)    Difficulty of Paying Living Expenses: Not hard at all  Food Insecurity: No Food Insecurity (03/14/2021)   Hunger Vital Sign    Worried About Running Out of Food in the Last Year: Never true    Ran Out of Food in the Last Year: Never true  Transportation Needs: No Transportation Needs (03/14/2021)   PRAPARE - Hydrologist (Medical): No    Lack of Transportation (Non-Medical): No  Physical Activity: Insufficiently Active (03/14/2021)   Exercise Vital Sign    Days of Exercise per Week: 1 day    Minutes of Exercise per Session: 10 min  Stress: Stress Concern Present (03/14/2021)   [place]    Feeling of Stress : To some extent  Social Connections: Moderately Integrated (03/14/2021)   Social Connection and Isolation Panel [NHANES]    Frequency of Communication with Friends and Family: More than three times a week    Frequency of Social Gatherings with Friends and Family: Three times a week    Attends Religious Services: More than 4 times per year    Active Member of Clubs or Organizations: No    Attends Archivist Meetings: Never    Marital Status: Married  Human resources officer Violence: Not At Risk  (03/14/2021)   Humiliation, Afraid, Rape, and Kick questionnaire    Fear of Current or Ex-Partner: No    Emotionally Abused: No    Physically Abused: No    Sexually Abused: No    Review of Systems: See HPI, otherwise negative ROS  Physical Exam: BP (!) 152/96   Pulse 83   Temp (!) 97.2 F (36.2 C) (Temporal)   Resp 12   Ht 5' 11"  (1.803 m)   Wt 134.3 kg   BMI 41.28 kg/m  General:   Alert, cooperative in NAD  Head:  Normocephalic and atraumatic. Respiratory:  Normal work of breathing. Cardiovascular:  RRR  Impression/Plan: Edward King is here for cataract surgery.  Risks, benefits, limitations, and alternatives regarding cataract surgery have been reviewed with the patient.  Questions have been answered.  All parties agreeable.   Benay Pillow, MD  07/03/2022, 7:19 AM

## 2022-07-04 ENCOUNTER — Encounter: Payer: Self-pay | Admitting: Ophthalmology

## 2022-07-12 DIAGNOSIS — H2512 Age-related nuclear cataract, left eye: Secondary | ICD-10-CM | POA: Diagnosis not present

## 2022-07-13 NOTE — Discharge Instructions (Signed)

## 2022-07-14 NOTE — Anesthesia Preprocedure Evaluation (Signed)
Anesthesia Evaluation  Patient identified by MRN, date of birth, ID band Patient awake    Reviewed: Allergy & Precautions, NPO status , Patient's Chart, lab work & pertinent test results  Airway Mallampati: IV  TM Distance: >3 FB Neck ROM: Full    Dental no notable dental hx.    Pulmonary sleep apnea ,    Pulmonary exam normal        Cardiovascular Exercise Tolerance: Poor hypertension, Pt. on medications + CAD  Normal cardiovascular exam     Neuro/Psych Pt denies history of CVA    GI/Hepatic negative GI ROS, Neg liver ROS,   Endo/Other  diabetes, Type 2Morbid obesity (BMI 42)  Renal/GU      Musculoskeletal  (+) Arthritis ,   Abdominal (+) + obese,   Peds  Hematology H/o prostate cancer   Anesthesia Other Findings   Reproductive/Obstetrics                            Anesthesia Physical  Anesthesia Plan  ASA: 3  Anesthesia Plan: MAC   Post-op Pain Management:    Induction: Intravenous  PONV Risk Score and Plan: 1 and TIVA, Midazolam and Treatment may vary due to age or medical condition  Airway Management Planned: Nasal Cannula and Natural Airway  Additional Equipment:   Intra-op Plan:   Post-operative Plan:   Informed Consent: I have reviewed the patients History and Physical, chart, labs and discussed the procedure including the risks, benefits and alternatives for the proposed anesthesia with the patient or authorized representative who has indicated his/her understanding and acceptance.     Dental advisory given  Plan Discussed with: CRNA  Anesthesia Plan Comments:        Anesthesia Quick Evaluation

## 2022-07-17 ENCOUNTER — Ambulatory Visit
Admission: RE | Admit: 2022-07-17 | Discharge: 2022-07-17 | Disposition: A | Discharge status: Home / Self Care | Payer: PPO | Attending: Ophthalmology | Admitting: Ophthalmology

## 2022-07-17 ENCOUNTER — Encounter: Payer: Self-pay | Admitting: Ophthalmology

## 2022-07-17 ENCOUNTER — Ambulatory Visit: Payer: PPO | Admitting: Anesthesiology

## 2022-07-17 ENCOUNTER — Other Ambulatory Visit: Payer: Self-pay

## 2022-07-17 ENCOUNTER — Ambulatory Visit (AMBULATORY_SURGERY_CENTER): Payer: PPO | Admitting: Anesthesiology

## 2022-07-17 ENCOUNTER — Encounter
Admission: RE | Disposition: A | Discharge status: Home / Self Care | Payer: Self-pay | Source: Home / Self Care | Attending: Ophthalmology

## 2022-07-17 DIAGNOSIS — I2510 Atherosclerotic heart disease of native coronary artery without angina pectoris: Secondary | ICD-10-CM | POA: Insufficient documentation

## 2022-07-17 DIAGNOSIS — Z8546 Personal history of malignant neoplasm of prostate: Secondary | ICD-10-CM | POA: Diagnosis not present

## 2022-07-17 DIAGNOSIS — G4730 Sleep apnea, unspecified: Secondary | ICD-10-CM

## 2022-07-17 DIAGNOSIS — Z79899 Other long term (current) drug therapy: Secondary | ICD-10-CM | POA: Insufficient documentation

## 2022-07-17 DIAGNOSIS — Z6841 Body Mass Index (BMI) 40.0 and over, adult: Secondary | ICD-10-CM | POA: Diagnosis not present

## 2022-07-17 DIAGNOSIS — I10 Essential (primary) hypertension: Secondary | ICD-10-CM | POA: Diagnosis not present

## 2022-07-17 DIAGNOSIS — E1136 Type 2 diabetes mellitus with diabetic cataract: Secondary | ICD-10-CM | POA: Insufficient documentation

## 2022-07-17 DIAGNOSIS — E119 Type 2 diabetes mellitus without complications: Secondary | ICD-10-CM | POA: Diagnosis not present

## 2022-07-17 DIAGNOSIS — H2512 Age-related nuclear cataract, left eye: Secondary | ICD-10-CM | POA: Diagnosis not present

## 2022-07-17 HISTORY — PX: CATARACT EXTRACTION W/PHACO: SHX586

## 2022-07-17 LAB — GLUCOSE, CAPILLARY
Glucose-Capillary: 176 mg/dL — ABNORMAL HIGH (ref 70–99)
Glucose-Capillary: 203 mg/dL — ABNORMAL HIGH (ref 70–99)

## 2022-07-17 SURGERY — PHACOEMULSIFICATION, CATARACT, WITH IOL INSERTION
Anesthesia: Monitor Anesthesia Care | Site: Eye | Laterality: Left

## 2022-07-17 MED ORDER — SIGHTPATH DOSE#1 BSS IO SOLN
INTRAOCULAR | Status: DC | PRN
  Administered 2022-07-17: 15 mL

## 2022-07-17 MED ORDER — MIDAZOLAM HCL 2 MG/2ML IJ SOLN: INTRAMUSCULAR | Status: DC | PRN

## 2022-07-17 MED ORDER — FENTANYL CITRATE (PF) 100 MCG/2ML IJ SOLN: INTRAMUSCULAR | Status: DC | PRN

## 2022-07-17 MED ORDER — MOXIFLOXACIN HCL 0.5 % OP SOLN
OPHTHALMIC | Status: DC | PRN
  Administered 2022-07-17: 0.2 mL via OPHTHALMIC

## 2022-07-17 MED ORDER — MIDAZOLAM HCL 2 MG/2ML IJ SOLN
INTRAMUSCULAR | Status: DC | PRN
  Administered 2022-07-17 (×2): 1 mg via INTRAVENOUS

## 2022-07-17 MED ORDER — ARMC OPHTHALMIC DILATING DROPS
1.0000 | OPHTHALMIC | Status: DC | PRN
  Administered 2022-07-17 (×3): 1 via OPHTHALMIC

## 2022-07-17 MED ORDER — TETRACAINE HCL 0.5 % OP SOLN
1.0000 [drp] | OPHTHALMIC | Status: DC | PRN
  Administered 2022-07-17 (×3): 1 [drp] via OPHTHALMIC

## 2022-07-17 MED ORDER — SIGHTPATH DOSE#1 BSS IO SOLN
INTRAOCULAR | Status: DC | PRN
  Administered 2022-07-17: 63 mL via OPHTHALMIC

## 2022-07-17 MED ORDER — FENTANYL CITRATE (PF) 100 MCG/2ML IJ SOLN
INTRAMUSCULAR | Status: DC | PRN
  Administered 2022-07-17 (×2): 50 ug via INTRAVENOUS

## 2022-07-17 MED ORDER — LIDOCAINE HCL (PF) 2 % IJ SOLN
INTRAOCULAR | Status: DC | PRN
  Administered 2022-07-17: 1 mL via INTRAOCULAR

## 2022-07-17 MED ORDER — SIGHTPATH DOSE#1 NA HYALUR & NA CHOND-NA HYALUR IO KIT
PACK | INTRAOCULAR | Status: DC | PRN
  Administered 2022-07-17: 1 via OPHTHALMIC

## 2022-07-17 SURGICAL SUPPLY — 14 items
CATARACT SUITE SIGHTPATH (MISCELLANEOUS) ×2 IMPLANT
DISSECTOR HYDRO NUCLEUS 50X22 (MISCELLANEOUS) ×2 IMPLANT
FEE CATARACT SUITE SIGHTPATH (MISCELLANEOUS) ×1 IMPLANT
GLOVE SURG GAMMEX PI TX LF 7.5 (GLOVE) ×2 IMPLANT
GLOVE SURG SYN 8.5  E (GLOVE) ×1
GLOVE SURG SYN 8.5 E (GLOVE) ×1 IMPLANT
GLOVE SURG SYN 8.5 PF PI (GLOVE) ×1 IMPLANT
LENS IOL TECNIS EYHANCE 23.0 (Intraocular Lens) ×1 IMPLANT
NDL FILTER BLUNT 18X1 1/2 (NEEDLE) ×1 IMPLANT
NEEDLE FILTER BLUNT 18X 1/2SAF (NEEDLE) ×1
NEEDLE FILTER BLUNT 18X1 1/2 (NEEDLE) ×1 IMPLANT
SYR 3ML LL SCALE MARK (SYRINGE) ×2 IMPLANT
SYR 5ML LL (SYRINGE) ×2 IMPLANT
WATER STERILE IRR 250ML POUR (IV SOLUTION) ×2 IMPLANT

## 2022-07-17 NOTE — Op Note (Signed)
OPERATIVE NOTE  Edward King [id number] 07/17/2022   PREOPERATIVE DIAGNOSIS:  Nuclear sclerotic cataract left eye.  H25.12   POSTOPERATIVE DIAGNOSIS:    Nuclear sclerotic cataract left eye.     PROCEDURE:  Phacoemusification with posterior chamber intraocular lens placement of the left eye   LENS:   Implant Name Type Inv. Item Serial No. Manufacturer Lot No. LRB No. Used Action  LENS IOL TECNIS EYHANCE 23.0 - [id number] Intraocular Lens LENS IOL TECNIS EYHANCE 23.0 [id number] [employer]  Left 1 Implanted      Procedure(s) with comments: CATARACT EXTRACTION PHACO AND INTRAOCULAR LENS PLACEMENT (IOC) LEFT DIABETIC (Left) - Diabetic 3.06 00:23.1  DIB00 +23.0   ULTRASOUND TIME: 0 minutes 23 seconds.  CDE 3.06   SURGEON:  Benay Pillow, MD, MPH   ANESTHESIA:  Topical with tetracaine drops augmented with 1% preservative-free intracameral lidocaine.  ESTIMATED BLOOD LOSS: <1 mL   COMPLICATIONS:  None.   DESCRIPTION OF PROCEDURE:  The patient was identified in the holding room and transported to the operating room and placed in the supine position under the operating microscope.  The left eye was identified as the operative eye and it was prepped and draped in the usual sterile ophthalmic fashion.   A 1.0 millimeter clear-corneal paracentesis was made at the 5:00 position. 0.5 ml of preservative-free 1% lidocaine with epinephrine was injected into the anterior chamber.  The anterior chamber was filled with viscoelastic.  A 2.4 millimeter keratome was used to make a near-clear corneal incision at the 2:00 position.  A curvilinear capsulorrhexis was made with a cystotome and capsulorrhexis forceps.  Balanced salt solution was used to hydrodissect and hydrodelineate the nucleus.   Phacoemulsification was then used in stop and chop fashion to remove the lens nucleus and epinucleus.  The remaining cortex was then removed using the irrigation and aspiration handpiece. Viscoelastic was then  placed into the capsular bag to distend it for lens placement.  A lens was then injected into the capsular bag.  The remaining viscoelastic was aspirated.   Wounds were hydrated with balanced salt solution.  The anterior chamber was inflated to a physiologic pressure with balanced salt solution.  Intracameral vigamox 0.1 mL undiltued was injected into the eye and a drop placed onto the ocular surface.  No wound leaks were noted.  The patient was taken to the recovery room in stable condition without complications of anesthesia or surgery  Benay Pillow 07/17/2022, 8:21 AM

## 2022-07-17 NOTE — H&P (Signed)
[facility]   Primary Care Physician:  Birdie Sons, MD Ophthalmologist: Dr. Benay Pillow  Pre-Procedure History & Physical: HPI:  Edward King is a 71 y.o. male here for cataract surgery.   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  mostly left   Neuromuscular disorder ([place])    Prostate CA ([place]) 2016   Radiation seed implants.   Stroke [facility])    Temporal arteritis ([place])    Clinically dx Dr. Jefm Bryant. Negative temporal artrtery bx   Wears hearing aid in both ears     Past Surgical History:  Procedure Laterality Date   ABDOMINOPLASTY     Carotid Doppler Ultrasound  08/22/2014   [facility]; Minimal plaque right. Minimal thisckening left Anterograde flow vertebrals   CATARACT EXTRACTION W/PHACO Right 07/03/2022   Procedure: CATARACT EXTRACTION PHACO AND INTRAOCULAR LENS PLACEMENT (IOC) RIGHT;  Surgeon: Eulogio Bear, MD;  Location: [facility];  Service: Ophthalmology;  Laterality: Right;  Diabetic 2.36 00:18.8   COLONOSCOPY WITH PROPOFOL N/A 02/13/2018   Procedure: COLONOSCOPY WITH PROPOFOL;  Surgeon: Manya Silvas, MD;  Location: [facility] ENDOSCOPY;  Service: Endoscopy;  Laterality: N/A;   CT of the head  08/22/2014   [facility]. Normal   DOPPLER ECHOCARDIOGRAPHY  08/29/2014   EF=60-65%. Normal LVEF   Myocardial Perfusion Scan  09/10/2012   Poor exercsie tolerance, but no evidence of stress induced myocardial ischemia. EF=66%. Dr. Clayborn Bigness   NASAL SEPTOPLASTY W/ TURBINOPLASTY Bilateral 02/10/2016   Procedure: NASAL SEPTOPLASTY WITH INFERIOR TURBINATE REDUCTION;  Surgeon: Carloyn Manner, MD;  Location: [facility] ORS;  Service: ENT;  Laterality: Bilateral;   pannectomy      TEMPORAL ARTERY BIOPSY / LIGATION Left     Prior to Admission medications   Medication Sig Start Date End Date Taking? Authorizing Provider  buPROPion (WELLBUTRIN SR) 150 MG 12 hr tablet Take 150 mg by mouth daily.   Yes [provider]  Coenzyme Q10 (COQ-10) 100 MG CAPS Take 100 mg by mouth daily.   Yes [provider]  Cyanocobalamin (VITAMIN B-12 PO) Take 1,000 mcg by mouth daily at 6 (six) AM.   Yes [provider]  gabapentin (NEURONTIN) 300 MG capsule TAKE 2 CAPSULES BY MOUTH IN THE MORNING AND 3 CAPSULES AT BEDTIME 06/24/21  Yes Birdie Sons, MD  metFORMIN (GLUCOPHAGE-XR) 500 MG 24 hr tablet TAKE 2 TABLETS BY MOUTH TWICE A DAY 02/19/22  Yes Birdie Sons, MD  mirtazapine (REMERON) 30 MG tablet Take 30 mg by mouth at bedtime.   Yes [provider]  pravastatin (PRAVACHOL) 40 MG tablet TAKE 1 TABLET (40 MG TOTAL) BY MOUTH DAILY WITH LUNCH. 05/26/22  Yes Birdie Sons, MD  valsartan-hydrochlorothiazide (DIOVAN-HCT) 80-12.5 MG tablet Take 1 tablet by mouth daily. 05/08/22  Yes Birdie Sons, MD  venlafaxine XR (EFFEXOR-XR) 150 MG 24 hr capsule Take 150 mg by mouth daily with breakfast.   Yes [provider]  Vitamin D, Cholecalciferol, 1000 units TABS Take 2,000 Units by mouth daily.   Yes [provider]  Dulaglutide (TRULICITY) 7.89 FY/1.0FB  SOPN Inject 0.75 mg into the skin once a week. 05/29/22   Birdie Sons, MD  glucose blood (CONTOUR NEXT TEST) test strip Use as instructed to check blood sugar daily 10/22/18   Birdie Sons, MD  meloxicam (MOBIC) 15 MG tablet Take 15 mg by mouth daily. Patient not taking: Reported on 06/26/2022 08/24/19   [provider]  mesalamine (APRISO) 0.375 g 24 hr capsule Take 375 mg by mouth daily. 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, 2017  Tobacco Use   Smoking status: Never   Smokeless tobacco: Never  Vaping Use   Vaping Use: Never used  Substance and Sexual Activity   Alcohol use: Yes    Alcohol/week: 0.0 standard drinks of alcohol    Comment: Rare - 1 drink   Drug use: No   Sexual activity: Not on file  Other Topics Concern   Not on file  Social History Narrative   Not on file   Social Determinants of Health   Financial Resource Strain: Low Risk  (03/14/2021)   Overall Financial Resource Strain (CARDIA)    Difficulty of Paying Living Expenses: Not hard at all  Food Insecurity: No Food Insecurity (03/14/2021)   Hunger Vital Sign    Worried About Running Out of Food in the Last Year: Never true    Ran Out of Food in the Last Year: Never true  Transportation Needs: No Transportation Needs (03/14/2021)   PRAPARE - Hydrologist (Medical): No    Lack of Transportation (Non-Medical): No  Physical Activity: Insufficiently Active (03/14/2021)   Exercise Vital Sign    Days of Exercise per Week: 1 day    Minutes of Exercise per Session: 10 min  Stress: Stress Concern Present (03/14/2021)   [place]    Feeling of Stress : To some extent  Social Connections: Moderately Integrated (03/14/2021)   Social Connection and Isolation Panel [NHANES]    Frequency of Communication with Friends and Family: More than three times a week    Frequency of Social Gatherings  with Friends and Family: Three times a week    Attends Religious Services: More than 4 times per year    Active Member of Clubs or Organizations: No    Attends Archivist Meetings: Never    Marital Status: Married  Human resources officer Violence: Not At Risk (03/14/2021)   Humiliation, Afraid, Rape, and Kick questionnaire    Fear of Current or Ex-Partner: No    Emotionally Abused: No    Physically Abused: No    Sexually Abused: No    Review of Systems: See HPI, otherwise negative ROS  Physical Exam: Pulse 85  Temp (!) 97.1 F (36.2 C) (Temporal)   Ht 5' 11"  (1.803 m)   Wt 133.4 kg   SpO2 96%   BMI 41.02 kg/m  General:   Alert, cooperative in NAD Head:  Normocephalic and atraumatic. Respiratory:  Normal work of breathing. Cardiovascular:  RRR  Impression/Plan: Edward King is here for cataract surgery.  Risks, benefits, limitations, and alternatives regarding cataract surgery have been reviewed with the patient.  Questions have been answered.  All parties agreeable.   Benay Pillow, MD  07/17/2022, 7:54 AM

## 2022-07-17 NOTE — Anesthesia Procedure Notes (Addendum)
Procedure Name: MAC Date/Time: 07/17/2022 7:25 AM  Performed by: Tollie Eth, CRNAPre-anesthesia Checklist: Patient identified, Emergency Drugs available, Suction available and Patient being monitored Patient Re-evaluated:Patient Re-evaluated prior to induction Oxygen Delivery Method: Nasal cannula Induction Type: IV induction Placement Confirmation: positive ETCO2

## 2022-07-17 NOTE — Transfer of Care (Signed)
Immediate Anesthesia Transfer of Care Note  Patient: Edward King  Procedure(s) Performed: CATARACT EXTRACTION PHACO AND INTRAOCULAR LENS PLACEMENT (IOC) LEFT DIABETIC (Left: Eye)  Patient Location: PACU  Anesthesia Type:MAC  Level of Consciousness: awake and alert   Airway & Oxygen Therapy: Patient Spontanous Breathing  Post-op Assessment: Report given to RN and Post -op Vital signs reviewed and stable  Post vital signs: Reviewed and stable  Last Vitals:  Vitals Value Taken Time  BP 143/80 07/17/22 0822  Temp    Pulse 85 07/17/22 0824  Resp 7 07/17/22 0824  SpO2 93 % 07/17/22 0824  Vitals shown include unvalidated device data.  Last Pain:  Vitals:   07/17/22 0714  TempSrc: Temporal  PainSc: 0-No pain         Complications: No notable events documented.

## 2022-07-17 NOTE — Anesthesia Postprocedure Evaluation (Signed)
Anesthesia Post Note  Patient: Edward King  Procedure(s) Performed: CATARACT EXTRACTION PHACO AND INTRAOCULAR LENS PLACEMENT (IOC) LEFT DIABETIC (Left: Eye)     Patient location during evaluation: PACU Anesthesia Type: MAC Level of consciousness: awake and alert Pain management: pain level controlled Vital Signs Assessment: post-procedure vital signs reviewed and stable Respiratory status: spontaneous breathing, nonlabored ventilation and respiratory function stable Cardiovascular status: blood pressure returned to baseline and stable Postop Assessment: no apparent nausea or vomiting Anesthetic complications: no   No notable events documented.  [place] Ouch

## 2022-07-18 DIAGNOSIS — F331 Major depressive disorder, recurrent, moderate: Secondary | ICD-10-CM | POA: Diagnosis not present

## 2022-07-31 ENCOUNTER — Other Ambulatory Visit: Payer: Self-pay | Admitting: Family Medicine

## 2022-07-31 DIAGNOSIS — R399 Unspecified symptoms and signs involving the genitourinary system: Secondary | ICD-10-CM

## 2022-08-01 NOTE — Telephone Encounter (Signed)
I called and spoke with patient. He states he is not taking this medication. Patient thinks the pharmacy may have sent this request in error through an auto refill request. Patient ask that we discard this refill request.

## 2022-08-01 NOTE — Telephone Encounter (Signed)
Tamsulosin was discontinued back in May when he had the severe dizziness.  Has he started back on this? And if so is dizziness resolved?

## 2022-08-02 DIAGNOSIS — Z961 Presence of intraocular lens: Secondary | ICD-10-CM | POA: Diagnosis not present

## 2022-08-04 DIAGNOSIS — F331 Major depressive disorder, recurrent, moderate: Secondary | ICD-10-CM | POA: Diagnosis not present

## 2022-08-07 ENCOUNTER — Ambulatory Visit: Payer: PPO | Admitting: Family Medicine

## 2022-08-10 DIAGNOSIS — Z961 Presence of intraocular lens: Secondary | ICD-10-CM | POA: Diagnosis not present

## 2022-08-10 DIAGNOSIS — H35713 Central serous chorioretinopathy, bilateral: Secondary | ICD-10-CM | POA: Diagnosis not present

## 2022-08-15 DIAGNOSIS — Z9842 Cataract extraction status, left eye: Secondary | ICD-10-CM | POA: Diagnosis not present

## 2022-08-15 DIAGNOSIS — Z8659 Personal history of other mental and behavioral disorders: Secondary | ICD-10-CM | POA: Diagnosis not present

## 2022-08-15 DIAGNOSIS — G4733 Obstructive sleep apnea (adult) (pediatric): Secondary | ICD-10-CM | POA: Diagnosis not present

## 2022-08-15 DIAGNOSIS — Z8739 Personal history of other diseases of the musculoskeletal system and connective tissue: Secondary | ICD-10-CM | POA: Diagnosis not present

## 2022-08-15 DIAGNOSIS — G3184 Mild cognitive impairment, so stated: Secondary | ICD-10-CM | POA: Diagnosis not present

## 2022-08-15 DIAGNOSIS — F419 Anxiety disorder, unspecified: Secondary | ICD-10-CM | POA: Diagnosis not present

## 2022-08-15 DIAGNOSIS — Z9841 Cataract extraction status, right eye: Secondary | ICD-10-CM | POA: Diagnosis not present

## 2022-08-16 ENCOUNTER — Other Ambulatory Visit: Payer: Self-pay

## 2022-08-16 DIAGNOSIS — G3184 Mild cognitive impairment, so stated: Secondary | ICD-10-CM | POA: Diagnosis not present

## 2022-08-16 DIAGNOSIS — D692 Other nonthrombocytopenic purpura: Secondary | ICD-10-CM | POA: Diagnosis not present

## 2022-08-16 DIAGNOSIS — L309 Dermatitis, unspecified: Secondary | ICD-10-CM | POA: Diagnosis not present

## 2022-08-16 DIAGNOSIS — L565 Disseminated superficial actinic porokeratosis (DSAP): Secondary | ICD-10-CM | POA: Diagnosis not present

## 2022-08-16 MED ORDER — MOMETASONE FUROATE 0.1 % EX OINT
TOPICAL_OINTMENT | CUTANEOUS | 2 refills | Status: DC
  Filled 2022-08-16: qty 15, 30d supply, fill #0

## 2022-08-17 DIAGNOSIS — F331 Major depressive disorder, recurrent, moderate: Secondary | ICD-10-CM | POA: Diagnosis not present

## 2022-08-21 ENCOUNTER — Other Ambulatory Visit: Payer: Self-pay | Admitting: Family Medicine

## 2022-08-21 DIAGNOSIS — I152 Hypertension secondary to endocrine disorders: Secondary | ICD-10-CM

## 2022-08-23 ENCOUNTER — Other Ambulatory Visit: Payer: Self-pay | Admitting: Neurology

## 2022-08-23 ENCOUNTER — Other Ambulatory Visit (HOSPITAL_COMMUNITY): Payer: Self-pay | Admitting: Neurology

## 2022-08-23 DIAGNOSIS — G3184 Mild cognitive impairment, so stated: Secondary | ICD-10-CM

## 2022-08-29 DIAGNOSIS — Z6841 Body Mass Index (BMI) 40.0 and over, adult: Secondary | ICD-10-CM | POA: Diagnosis not present

## 2022-08-29 DIAGNOSIS — R42 Dizziness and giddiness: Secondary | ICD-10-CM | POA: Diagnosis not present

## 2022-08-29 DIAGNOSIS — G4733 Obstructive sleep apnea (adult) (pediatric): Secondary | ICD-10-CM | POA: Diagnosis not present

## 2022-08-29 DIAGNOSIS — I10 Essential (primary) hypertension: Secondary | ICD-10-CM | POA: Diagnosis not present

## 2022-08-29 DIAGNOSIS — R0602 Shortness of breath: Secondary | ICD-10-CM | POA: Diagnosis not present

## 2022-08-29 DIAGNOSIS — G4730 Sleep apnea, unspecified: Secondary | ICD-10-CM | POA: Diagnosis not present

## 2022-08-29 DIAGNOSIS — M316 Other giant cell arteritis: Secondary | ICD-10-CM | POA: Diagnosis not present

## 2022-08-29 DIAGNOSIS — I208 Other forms of angina pectoris: Secondary | ICD-10-CM | POA: Diagnosis not present

## 2022-08-29 DIAGNOSIS — R609 Edema, unspecified: Secondary | ICD-10-CM | POA: Diagnosis not present

## 2022-08-29 DIAGNOSIS — E119 Type 2 diabetes mellitus without complications: Secondary | ICD-10-CM | POA: Diagnosis not present

## 2022-08-29 DIAGNOSIS — Z8719 Personal history of other diseases of the digestive system: Secondary | ICD-10-CM | POA: Diagnosis not present

## 2022-09-04 ENCOUNTER — Ambulatory Visit (HOSPITAL_COMMUNITY)
Admission: RE | Admit: 2022-09-04 | Discharge: 2022-09-04 | Disposition: A | Discharge status: Home / Self Care | Payer: PPO | Source: Ambulatory Visit | Attending: Neurology | Admitting: Neurology

## 2022-09-04 DIAGNOSIS — I6782 Cerebral ischemia: Secondary | ICD-10-CM | POA: Diagnosis not present

## 2022-09-04 DIAGNOSIS — G3184 Mild cognitive impairment, so stated: Secondary | ICD-10-CM | POA: Diagnosis not present

## 2022-09-04 DIAGNOSIS — R930 Abnormal findings on diagnostic imaging of skull and head, not elsewhere classified: Secondary | ICD-10-CM | POA: Diagnosis not present

## 2022-09-13 ENCOUNTER — Ambulatory Visit (INDEPENDENT_AMBULATORY_CARE_PROVIDER_SITE_OTHER): Payer: PPO | Admitting: Family

## 2022-09-13 ENCOUNTER — Encounter: Payer: Self-pay | Admitting: Family

## 2022-09-13 VITALS — BP 134/86 | HR 79 | Temp 98.6°F | Ht 71.0 in | Wt 297.0 lb

## 2022-09-13 DIAGNOSIS — K5010 Crohn's disease of large intestine without complications: Secondary | ICD-10-CM

## 2022-09-13 DIAGNOSIS — E782 Mixed hyperlipidemia: Secondary | ICD-10-CM | POA: Diagnosis not present

## 2022-09-13 DIAGNOSIS — E1149 Type 2 diabetes mellitus with other diabetic neurological complication: Secondary | ICD-10-CM | POA: Diagnosis not present

## 2022-09-13 DIAGNOSIS — Z8546 Personal history of malignant neoplasm of prostate: Secondary | ICD-10-CM | POA: Diagnosis not present

## 2022-09-13 DIAGNOSIS — R0689 Other abnormalities of breathing: Secondary | ICD-10-CM | POA: Diagnosis not present

## 2022-09-13 DIAGNOSIS — E1159 Type 2 diabetes mellitus with other circulatory complications: Secondary | ICD-10-CM | POA: Diagnosis not present

## 2022-09-13 DIAGNOSIS — I152 Hypertension secondary to endocrine disorders: Secondary | ICD-10-CM

## 2022-09-13 DIAGNOSIS — Z23 Encounter for immunization: Secondary | ICD-10-CM | POA: Diagnosis not present

## 2022-09-13 DIAGNOSIS — R5383 Other fatigue: Secondary | ICD-10-CM | POA: Diagnosis not present

## 2022-09-13 DIAGNOSIS — S41112A Laceration without foreign body of left upper arm, initial encounter: Secondary | ICD-10-CM

## 2022-09-13 LAB — LIPID PANEL
Cholesterol: 146 mg/dL (ref 0–200)
HDL: 49.2 mg/dL (ref >39.00)
LDL Cholesterol: 76 mg/dL (ref 0–99)
NonHDL: 97.23
Total CHOL/HDL Ratio: 3
Triglycerides: 106 mg/dL (ref 0.0–149.0)
VLDL: 21.2 mg/dL (ref 0.0–40.0)

## 2022-09-13 LAB — COMPREHENSIVE METABOLIC PANEL
ALT: 17 U/L (ref 0–53)
AST: 16 U/L (ref 0–37)
Albumin: 3.7 g/dL (ref 3.5–5.2)
Alkaline Phosphatase: 85 U/L (ref 39–117)
BUN: 19 mg/dL (ref 6–23)
CO2: 32 mEq/L (ref 19–32)
Calcium: 9.2 mg/dL (ref 8.4–10.5)
Chloride: 101 mEq/L (ref 96–112)
Creatinine, Ser: 1.28 mg/dL (ref 0.40–1.50)
GFR: 56.12 mL/min — ABNORMAL LOW (ref >60.00)
Glucose, Bld: 158 mg/dL — ABNORMAL HIGH (ref 70–99)
Potassium: 4.2 mEq/L (ref 3.5–5.1)
Sodium: 139 mEq/L (ref 135–145)
Total Bilirubin: 0.3 mg/dL (ref 0.2–1.2)
Total Protein: 6.9 g/dL (ref 6.0–8.3)

## 2022-09-13 LAB — CBC
HCT: 42.2 % (ref 39.0–52.0)
Hemoglobin: 13.6 g/dL (ref 13.0–17.0)
MCHC: 32.2 g/dL (ref 30.0–36.0)
MCV: 86.8 fl (ref 78.0–100.0)
Platelets: 227 10*3/uL (ref 150.0–400.0)
RBC: 4.86 Mil/uL (ref 4.22–5.81)
RDW: 14.5 % (ref 11.5–15.5)
WBC: 7.2 10*3/uL (ref 4.0–10.5)

## 2022-09-13 LAB — HEMOGLOBIN A1C: Hgb A1c MFr Bld: 8.2 % — ABNORMAL HIGH (ref 4.6–6.5)

## 2022-09-13 MED ORDER — MUPIROCIN 2 % EX OINT: 1.0000 | TOPICAL_OINTMENT | Freq: Two times a day (BID) | CUTANEOUS | 0 refills | Status: AC

## 2022-09-13 NOTE — Progress Notes (Signed)
New Patient Office Visit  Subjective:  Patient ID: Edward King, male    DOB: 03/12/1951  Age: 71 y.o. MRN: [id number]  CC:  Chief Complaint  Patient presents with  . tranfer of care    From Dr Caryn Section.   . Dizziness    X 2 weeks seen by cardiology for work up. No improvement.     HPI Edward King is here to establish care as a new patient.  Prior provider was: Dr. Caryn Section  Pt is without acute concerns.   dizziness: was seen by cardiologist Dr. Clayborn Bigness 08/29/22 and has f/u again with him tomorrow.  States had a cold several months ago, and has not been able to completely shake the symptoms. Walking around and or getting up from lying position, or changing positions, does feel dizziness. He gets up slowly to try to avoid this from happening.   By the end of day he does not increased exhaustion.  He has had a sleep study years ago, and was recommended to using a CPAP.   Did review MRI from 9/18 and possible small subacute infarct within right frontal white matter. Has yet to f/u with neurologist. He does report short term memory loss at times, and loses things often. He has noticed at times that he has trouble saying the right word or finding the right word at times, this has been over the last six months. Does not report issues with driving or forgetting where he is going. He was seen by Dr. Manuella Ghazi, neurology, pending appt f/u with results of his MRI.   chronic concerns:  H/o temporal arteritis: has resolved in vision loss hard to see things up close. Has seen opthamologist. Has had new prescription written for eyes but no improvement with new glasses. Dr. Edison Pace is his opthamologist.   Anxiety/depression: currently seeing psychiatry and on wellbutrin SR 150 mg, celexa 40 mg, remeron 15 mg as well as effexor 150 mg. Was recommended to see neuropsychology per neurologist.   DM2: on trulicity .75 mg weekly, states he was increased on trulicity resulting in constipation, so he states  was going to be decreased to .75 mg but then he went to fill it and was 4.5 mg? So he didn't fill , so he has not been taking trulicity for about one month.   H/o prostate cancer: h/o seed testosterone placement.  Lab Results  Component Value Date   PSA1 <0.1 05/05/2022   PSA1 <0.1 08/26/2021   PSA1 <0.1 05/04/2020   PSA 6.6 10/09/2014      Past Medical History:  Diagnosis Date  . Arthritis   . Cat bite 07/31/2018  . Cellulitis of right leg without foot   . Colitis   . Depression   . Diabetes mellitus without complication ([place])   . 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 History of chicken pox   . History of measles   . History of mumps   . Hypertension   . Lymph edema    both legs.  mostly left  . Neuromuscular disorder ([place])   . Prostate CA ([place]) 2016   Radiation seed implants.  . Stroke ([place])   . Temporal arteritis ([facility])    Clinically dx Dr. Jefm Bryant. Negative temporal artrtery bx  . Wears hearing aid in both ears     Past Surgical History:  Procedure Laterality Date  . ABDOMINOPLASTY    . Carotid Doppler Ultrasound  08/22/2014   [facility]; Minimal plaque right. Minimal thisckening left  Anterograde flow vertebrals  . CATARACT EXTRACTION W/PHACO Right 07/03/2022   Procedure: CATARACT EXTRACTION PHACO AND INTRAOCULAR LENS PLACEMENT ([place]) RIGHT;  Surgeon: Eulogio Bear, MD;  Location: [place];  Service: Ophthalmology;  Laterality: Right;  Diabetic 2.36 00:18.8  . CATARACT EXTRACTION W/PHACO Left 07/17/2022   Procedure: CATARACT EXTRACTION PHACO AND INTRAOCULAR LENS PLACEMENT ([place]) LEFT DIABETIC;  Surgeon: Eulogio Bear, MD;  Location: [place];  Service: Ophthalmology;  Laterality: Left;  Diabetic 3.06 00:23.1  . COLONOSCOPY WITH PROPOFOL N/A 02/13/2018   Procedure: COLONOSCOPY WITH PROPOFOL;  Surgeon: Manya Silvas, MD;  [facility] END[facility]COPY;  Service: Endoscopy;  Laterality: N/A;  . CT of the head  08/22/2014   [facility]. Normal  .  DOPPLER ECHOCARDIOGRAPHY  08/29/2014   EF=60-65%. Normal LVEF  . Myocardial Perfusion Scan  09/10/2012   Poor exercsie tolerance, but no evidence of stress induced myocardial ischemia. EF=66%. Dr. Clayborn Bigness  . NASAL SEPTOPLASTY W/ TURBINOPLASTY Bilateral 02/10/2016   Procedure: NASAL SEPTOPLASTY WITH INFERIOR TURBINATE REDUCTION;  Surgeon: Carloyn Manner, MD;  Location: [facility];  Service: ENT;  Laterality: Bilateral;  . pannectomy    . TEMPORAL ARTERY BIOPSY / LIGATION Left     Family History  Problem Relation Age of Onset  . Colon cancer Mother   . Coronary artery disease Mother   . Prostate cancer Father   . Kidney disease Father   . Dementia Brother   . Diabetes Neg Hx     Social History   Socioeconomic History  . Marital status: Married    Spouse name: Not on file  . Number of children: 1  . Years of education: Not on file  . Highest education level: Some college, no degree  Occupational History  . 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 Smoking status: Never  . Smokeless tobacco: Never  Vaping Use  . Vaping Use: Never used  Substance and Sexual Activity  . Alcohol use: Yes    Alcohol/week: 0.0 standard drinks of alcohol    Comment: Rare - 1 drink  . Drug use: No  . Sexual activity: Not on file  Other Topics Concern  . Not on file  Social History Narrative  . Not on file   Social Determinants of Health   Financial Resource Strain: Low Risk  (03/14/2021)   Overall Financial Resource Strain (CARDIA)   . Difficulty of Paying Living Expenses: Not hard at all  Food Insecurity: No Food Insecurity (03/14/2021)   Hunger Vital Sign   . Worried About Charity fundraiser in the Last Year: Never true   . Ran Out of Food in the Last Year: Never true  Transportation Needs: No Transportation Needs (03/14/2021)   PRAPARE - Transportation   . Lack of Transportation (Medical): No   . Lack of Transportation  (Non-Medical): No  Physical Activity: Insufficiently Active (03/14/2021)   Exercise Vital Sign   . Days of Exercise per Week: 1 day   . Minutes of Exercise per Session: 10 min  Stress: Stress Concern Present (03/14/2021)   [place]   . Feeling of Stress : To some extent  Social Connections: Moderately Integrated (03/14/2021)   Social Connection and Isolation Panel [NHANES]   . Frequency of Communication with Friends and Family: More than three times a week   . Frequency of Social Gatherings with Friends and  Family: Three times a week   . Attends Religious Services: More than 4 times per year   . Active Member of Clubs or Organizations: No   . Attends Archivist Meetings: Never   . Marital Status: Married  Human resources officer Violence: Not At Risk (03/14/2021)   Humiliation, Afraid, Rape, and Kick questionnaire   . Fear of Current or Ex-Partner: No   . Emotionally Abused: No   . Physically Abused: No   . Sexually Abused: No    Outpatient Medications Prior to Visit  Medication Sig Dispense Refill  . buPROPion (WELLBUTRIN SR) 150 MG 12 hr tablet Take 150 mg by mouth daily.    . Coenzyme Q10 (COQ-10) 100 MG CAPS Take 100 mg by mouth daily.    . Cyanocobalamin (VITAMIN B-12 PO) Take 1,000 mcg by mouth daily at 6 (six) AM.    . Dulaglutide (TRULICITY) 5.17 GY/1.7CB SOPN Inject 0.75 mg into the skin once a week. 2 mL 3  . gabapentin (NEURONTIN) 300 MG capsule TAKE 2 CAPSULES BY MOUTH IN THE MORNING AND 3 CAPSULES AT BEDTIME 450 capsule 3  . glucose blood (CONTOUR NEXT TEST) test strip Use as instructed to check blood sugar daily 100 each 4  . metFORMIN (GLUCOPHAGE-XR) 500 MG 24 hr tablet TAKE 2 TABLETS BY MOUTH TWICE A DAY 360 tablet 4  . pravastatin (PRAVACHOL) 40 MG tablet TAKE 1 TABLET (40 MG TOTAL) BY MOUTH DAILY WITH LUNCH. 90 tablet 3  . valsartan-hydrochlorothiazide (DIOVAN-HCT) 80-12.5 MG tablet TAKE 1 TABLET BY  MOUTH EVERY DAY 90 tablet 0  . venlafaxine XR (EFFEXOR-XR) 150 MG 24 hr capsule Take 150 mg by mouth daily with breakfast.    . Vitamin D, Cholecalciferol, 1000 units TABS Take 2,000 Units by mouth daily.    . mometasone (ELOCON) 0.1 % ointment Apply twice daily to non facial sites until clear. 15 g 2  . meloxicam (MOBIC) 15 MG tablet Take 15 mg by mouth daily. (Patient not taking: Reported on 06/26/2022)    . mesalamine (APRISO) 0.375 g 24 hr capsule Take 375 mg by mouth daily. (Patient not taking: Reported on 06/26/2022)    . mirtazapine (REMERON) 30 MG tablet Take 30 mg by mouth at bedtime. (Patient not taking: Reported on 09/13/2022)     No facility-administered medications prior to visit.    No Known Allergies      Objective:    Physical Exam Constitutional:      General: He is not in acute distress.    Appearance: Normal appearance. He is obese. He is not ill-appearing, toxic-appearing or diaphoretic.  HENT:     Head: Normocephalic.     Right Ear: Tympanic membrane normal.     Left Ear: Tympanic membrane normal.     Nose: Nose normal.  Eyes:     Pupils: Pupils are equal, round, and reactive to light.  Cardiovascular:     Rate and Rhythm: Normal rate and regular rhythm.  Pulmonary:     Effort: Pulmonary effort is normal.     Breath sounds: Normal breath sounds.  Abdominal:     General: Abdomen is flat. Bowel sounds are normal.     Palpations: Abdomen is soft.     Tenderness: There is no abdominal tenderness.  Musculoskeletal:        General: Normal range of motion.     Cervical back: Normal range of motion.  Skin:    General: Skin is warm.     Findings: Laceration (left  anterior forearm base of elbow with slight excoriation, flap of skin still intact with mild erythemand dried blood underneath. clear drainage. mild surrounding erythema) present.       Neurological:     General: No focal deficit present.     Mental Status: He is alert and oriented to person, place,  and time.     Cranial Nerves: Cranial nerves 2-12 are intact. No cranial nerve deficit or facial asymmetry.     Sensory: Sensation is intact.     Motor: Motor function is intact. No weakness.     Coordination: Coordination is intact.     Gait: Gait is intact. Gait normal.     Comments: At times with expressive aphasia, seems to pause prior to saying word, with some short term memory loss, hard to recall certain events or recent doctor visits  Psychiatric:        Mood and Affect: Mood normal.        Behavior: Behavior normal.        Thought Content: Thought content normal.        Judgment: Judgment normal.     BP 134/86   Pulse 79   Temp 98.6 F (37 C) (Oral)   Ht _0  (1.803 m)   Wt 297 lb (134.7 kg)   SpO2 93%   BMI 41.42 kg/m  Wt Readings from Last 3 Encounters:  09/13/22 297 lb (134.7 kg)  07/17/22 294 lb 1.6 oz (133.4 kg)  07/03/22 296 lb (134.3 kg)     Health Maintenance Due  Topic Date Due  . FOOT EXAM  Never done  . Zoster Vaccines- Shingrix (1 of 2) Never done  . COVID-19 Vaccine (4 - Pfizer risk series) 01/12/2021  . COLONOSCOPY (Pts 45-87yr Insurance coverage will need to be confirmed)  02/13/2021    There are no preventive care reminders to display for this patient.  Lab Results  Component Value Date   TSH 2.230 05/05/2022   Lab Results  Component Value Date   WBC 7.2 09/13/2022   HGB 13.6 09/13/2022   HCT 42.2 09/13/2022   MCV 86.8 09/13/2022   PLT 227.0 09/13/2022   Lab Results  Component Value Date   NA 139 09/13/2022   K 4.2 09/13/2022   CO2 32 09/13/2022   GLUCOSE 158 (H) 09/13/2022   BUN 19 09/13/2022   CREATININE 1.28 09/13/2022   BILITOT 0.3 09/13/2022   ALKPHOS 85 09/13/2022   AST 16 09/13/2022   ALT 17 09/13/2022   PROT 6.9 09/13/2022   ALBUMIN 3.7 09/13/2022   CALCIUM 9.2 09/13/2022   ANIONGAP 8 05/30/2022   EGFR 58 (L) 05/05/2022   GFR 56.12 (L) 09/13/2022   Lab Results  Component Value Date   CHOL 146 09/13/2022    Lab Results  Component Value Date   HDL 49.20 09/13/2022   Lab Results  Component Value Date   LDLCALC 76 09/13/2022   Lab Results  Component Value Date   TRIG 106.0 09/13/2022   Lab Results  Component Value Date   CHOLHDL 3 09/13/2022   Lab Results  Component Value Date   HGBA1C 8.2 (H) 09/13/2022      Assessment & Plan:   Problem List Items Addressed This Visit       Cardiovascular and Mediastinum   [place]    Continue diovan HCT  Pt advised of the following:  Continue medication as prescribed. Monitor blood pressure periodically and/or when you feel symptomatic. Goal  is <130/90 on average. Ensure that you have rested for 30 minutes prior to checking your blood pressure. Record your readings and bring them to your next visit if necessary.work on a low sodium diet.         Digestive   Crohn's disease of colon without complication [facility])    Did advise patient that he is overdue with gastroenterologist. Wrote reminder and advised patient to call GI to schedule follow-up        Endocrine   Diabetes mellitus ([place]) - Primary   Relevant Orders   Hemoglobin A1c (Completed)     Other   Fatigue   Relevant Orders   Ambulatory referral to Pulmonology   CBC (Completed)   Comprehensive metabolic panel (Completed)   H/O prostate cancer   Mixed hyperlipidemia    Ordered lipid panel, pending results. Work on low cholesterol diet and exercise as tolerated       Relevant Orders   Lipid panel (Completed)   Gasping for breath    Referral placed for pulmonologist for possible sleep apnea work-up      Relevant Orders   Ambulatory referral to Pulmonology   Laceration of left upper extremity    Rx mupirocin 2% :Advised patient of the following: please monitor site for worsening signs/symptoms of infection to include: increasing redness, increasing tenderness, increase in size, and or pustulant drainage from site. If this is to  occur please let me know immediately.         Relevant Medications   mupirocin ointment (BACTROBAN) 2 %   Other Relevant Orders   WOUND CULTURE (Completed)   Need for immunization against influenza    Influenza  vaccine administered in office Pt tolerated procedure well  Verbal consent obtained prior to administration  Handout given in regards to vaccination.   Obtain history, acute and chronic concerns as well as reviewing prior notes and lab work, approximately took about 64 minutes in clinic as pt often had to recollect history as well as hard for him to remember.       Relevant Orders   Flu Vaccine QUAD High Dose(Fluad) (Completed)    Meds ordered this encounter  Medications  . mupirocin ointment (BACTROBAN) 2 %    Sig: Apply 1 Application topically 2 (two) times daily for 10 days.    Dispense:  20 g    Refill:  0    Order Specific Question:   Supervising Provider    Answer:   Diona Browner, [name] E [[id number]]    Follow-up: Return in about 1 month (around 10/13/2022) for f/u diabetes.    Eugenia Pancoast, FNP

## 2022-09-13 NOTE — Patient Instructions (Addendum)
------------------------------------    A referral was placed today for pulmonology for sleep study.  Please let us know if you have not heard back within 2 weeks about the referral.  You have a follow up appointment with Dr. Brigitte Pulse October 30th at 1 pm. This is your neurologist.    ------------------------------------  I do suggest you follow up and make appointment with GI for Dr. Chauncey Mann.   ------------------------------------  Please monitor site for worsening signs/symptoms of infection to include: increasing redness, increasing tenderness, increase in size, and or pustulant drainage from site. If this is to occur please let me know immediately.   ------------------------------------  CHANGE WAY YOU ARE TAKING GABAPENTIN.  CHANGE TO ONE TABLET IN AM (300 MG)  ONE TABLET AT NOON/LUNCH TIME (300 MG) AND  TAKE TWO TABLETS AT NIGHT TIME (600 MG)   I think this may be increasing your fatigue and dizziness ------------------------------------  Give opthalmology (eye doctor ) a call to discuss vision not improving with new prescription.    ------------------------------------  Call GI to schedule your follow up for your colonoscopy.    ------------------------------------   Welcome to our clinic, I am happy to have you as my new patient. I am excited to continue on this healthcare journey with you.  Stop by the lab prior to leaving today. I will notify you of your results once received.   Please keep in mind Any my chart messages yhnou send have up to a three business day turnaround for a response.  Phone calls may take up to a one full business day turnaround for a  response.   If you need a medication refill I recommend you request it through the pharmacy as this is easiest for [place] rather than sending a message and or phone call.   Due to recent changes in healthcare laws, you may see results of your imaging and/or laboratory studies on MyChart before I have had a chance to  review them.  I understand that in some cases there may be results that are confusing or concerning to you. Please understand that not all results are received at the same time and often I may need to interpret multiple results in order to provide you with the best plan of care or course of treatment. Therefore, I ask that you please give me 2 business days to thoroughly review all your results before contacting my office for clarification. Should we see a critical lab result, you will be contacted sooner.   It was a pleasure seeing you today! Please do not hesitate to reach out with any questions and or concerns.  Regards,   Eugenia Pancoast FNP-C

## 2022-09-14 DIAGNOSIS — Z23 Encounter for immunization: Secondary | ICD-10-CM | POA: Insufficient documentation

## 2022-09-14 NOTE — Assessment & Plan Note (Signed)
Ordered hga1c today pending results. Work on diabetic diet and exercise as tolerated. Yearly foot exam, and annual eye exam.   For neuropathy patient on gabapentin I do feel that this could be causing more fatigue than desired.  Advised patient to change to 1 pill in the a.m. 1 midday and 2 pills at night and see if this helps with fatigue.

## 2022-09-14 NOTE — Assessment & Plan Note (Signed)
Continue diovan HCT  Pt advised of the following:  Continue medication as prescribed. Monitor blood pressure periodically and/or when you feel symptomatic. Goal is <130/90 on average. Ensure that you have rested for 30 minutes prior to checking your blood pressure. Record your readings and bring them to your next visit if necessary.work on a low sodium diet.

## 2022-09-14 NOTE — Assessment & Plan Note (Signed)
Rx mupirocin 2% :Advised patient of the following: please monitor site for worsening signs/symptoms of infection to include: increasing redness, increasing tenderness, increase in size, and or pustulant drainage from site. If this is to occur please let me know immediately.

## 2022-09-14 NOTE — Assessment & Plan Note (Addendum)
Influenza  vaccine administered in office Pt tolerated procedure well  Verbal consent obtained prior to administration  Handout given in regards to vaccination.   Obtain history, acute and chronic concerns as well as reviewing prior notes and lab work, approximately took about 64 minutes in clinic as pt often had to recollect history as well as hard for him to remember.

## 2022-09-14 NOTE — Assessment & Plan Note (Signed)
Referral placed for pulmonologist for possible sleep apnea work-up

## 2022-09-14 NOTE — Assessment & Plan Note (Signed)
Ordered lipid panel, pending results. Work on low cholesterol diet and exercise as tolerated ? ?

## 2022-09-14 NOTE — Assessment & Plan Note (Signed)
Did advise patient that he is overdue with gastroenterologist. Wrote reminder and advised patient to call GI to schedule follow-up

## 2022-09-17 LAB — WOUND CULTURE
MICRO NUMBER:: 13975716
RESULT:: NO GROWTH
SPECIMEN QUALITY:: ADEQUATE

## 2022-09-18 NOTE — Progress Notes (Signed)
Hga1c is not at desired level we need to adjust some medications.  Please have pt bring in medication list from what he is taking at home and make follow up appt so we can discuss in detail.   Kidney function a bit low, make sure drinking a good amount of water daily.

## 2022-09-19 ENCOUNTER — Telehealth: Payer: Self-pay | Admitting: Family

## 2022-09-19 NOTE — Telephone Encounter (Signed)
Patient called in and stated he would like to speak with Tabitha regarding his kidney issue. He stated he just had some more questions. Thank you!

## 2022-09-19 NOTE — Telephone Encounter (Signed)
Spoke to pt and scheduled an appointment to talk about labs and an issue he is having.

## 2022-09-20 ENCOUNTER — Encounter: Payer: Self-pay | Admitting: Family

## 2022-09-20 ENCOUNTER — Ambulatory Visit (INDEPENDENT_AMBULATORY_CARE_PROVIDER_SITE_OTHER): Payer: PPO | Admitting: Family

## 2022-09-20 VITALS — BP 132/82 | HR 91 | Temp 98.6°F | Resp 16 | Ht 71.0 in | Wt 286.2 lb

## 2022-09-20 DIAGNOSIS — E1149 Type 2 diabetes mellitus with other diabetic neurological complication: Secondary | ICD-10-CM | POA: Diagnosis not present

## 2022-09-20 DIAGNOSIS — Z8673 Personal history of transient ischemic attack (TIA), and cerebral infarction without residual deficits: Secondary | ICD-10-CM | POA: Insufficient documentation

## 2022-09-20 DIAGNOSIS — S41112A Laceration without foreign body of left upper arm, initial encounter: Secondary | ICD-10-CM | POA: Diagnosis not present

## 2022-09-20 DIAGNOSIS — R339 Retention of urine, unspecified: Secondary | ICD-10-CM | POA: Diagnosis not present

## 2022-09-20 DIAGNOSIS — R0689 Other abnormalities of breathing: Secondary | ICD-10-CM | POA: Diagnosis not present

## 2022-09-20 DIAGNOSIS — R0683 Snoring: Secondary | ICD-10-CM | POA: Diagnosis not present

## 2022-09-20 DIAGNOSIS — Z8546 Personal history of malignant neoplasm of prostate: Secondary | ICD-10-CM

## 2022-09-20 LAB — POC URINALSYSI DIPSTICK (AUTOMATED)
Bilirubin, UA: NEGATIVE
Blood, UA: NEGATIVE
Glucose, UA: NEGATIVE
Ketones, UA: NEGATIVE
Leukocytes, UA: NEGATIVE
Nitrite, UA: NEGATIVE
Protein, UA: POSITIVE — AB
Spec Grav, UA: 1.025 (ref 1.010–1.025)
Urobilinogen, UA: 0.2 E.U./dL
pH, UA: 6 (ref 5.0–8.0)

## 2022-09-20 LAB — URINALYSIS, ROUTINE W REFLEX MICROSCOPIC
Hgb urine dipstick: NEGATIVE
Ketones, ur: NEGATIVE
Leukocytes,Ua: NEGATIVE
Nitrite: NEGATIVE
RBC / HPF: NONE SEEN (ref 0–?)
Specific Gravity, Urine: 1.025 (ref 1.000–1.030)
Urine Glucose: NEGATIVE
Urobilinogen, UA: 0.2 (ref 0.0–1.0)
pH: 6 (ref 5.0–8.0)

## 2022-09-20 LAB — PSA: PSA: 0 ng/mL — ABNORMAL LOW (ref 0.10–4.00)

## 2022-09-20 MED ORDER — TRULICITY 0.75 MG/0.5ML ~~LOC~~ SOAJ: 0.7500 mg | SUBCUTANEOUS | 3 refills | Status: DC

## 2022-09-20 MED ORDER — ASPIRIN 81 MG PO TBEC: 81.0000 mg | DELAYED_RELEASE_TABLET | Freq: Every day | ORAL | 1 refills | Status: AC

## 2022-09-20 MED ORDER — GABAPENTIN 300 MG PO CAPS: ORAL_CAPSULE | ORAL | 3 refills | Status: DC

## 2022-09-20 NOTE — Assessment & Plan Note (Signed)
Urine culture ordered today as well as urinalysis to rule out UTI and/or other ideology If all symptoms are negative may consider referral to urology for nighttime symptoms

## 2022-09-20 NOTE — Patient Instructions (Signed)
  Call [phone] to make an appointment For your referral for pulmonary to evaluate for sleep apnea for ongoing fatigue.

## 2022-09-20 NOTE — Assessment & Plan Note (Signed)
Improving. Advised to continue please monitor site for worsening signs/symptoms of infection to include: increasing redness, increasing tenderness, increase in size, and or pustulant drainage from site. If this is to occur please let me know immediately.

## 2022-09-20 NOTE — Assessment & Plan Note (Signed)
Repeat PSA today as lower urinary symptoms

## 2022-09-20 NOTE — Assessment & Plan Note (Signed)
Continue daily low-dose aspirin

## 2022-09-20 NOTE — Progress Notes (Signed)
Established Patient Office Visit  Subjective:  Patient ID: Edward King, male    DOB: 1951-07-28  Age: 71 y.o. MRN: [id number]  CC:  Chief Complaint  Patient presents with   Abnormal Lab    HPI Edward King is here today for follow up.   Pt is with acute concerns. He does have increased fatigue, he does report he feels sleepy often.  He did speak with Dr. Manuella Ghazi yesterday and was informed he had had a slight stroke, and was recommended to start low dose asa 81 mg daily.   Diabetes type 2: taking metformin 1000 mg twice daily . He had been without trulicity for a few months, and he has recently restarted trulicity at 7.62 mg once weekly.   Neuropathy: as of right now he is only taking one pill 300 mg at lunch time, and seems to be doing well with him.   Does note that he often has urinary retention, and also trouble starting a stream of urine. He states symptoms have been going on for a few months. If he lies on his back in the back he notices he has to pee even more often. Does have h/o prostate cancer, did seeds, isotope.  Lab Results  Component Value Date   PSA1 <0.1 05/05/2022   PSA1 <0.1 08/26/2021   PSA1 <0.1 05/04/2020   PSA 6.6 10/09/2014   08/29/22, recently seen by cardiology for sob, workup unremarkable.  Was advised to seek sleep study for consult, I did refer him last week for this. Pt to call for appt.  6 month f/u with cardiology recommended.    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  mostly left   Neuromuscular disorder ([place])    Prostate CA ([place]) 2016   Radiation seed implants.   Stroke [facility])    Temporal arteritis ([place])    Clinically dx Dr. Jefm Bryant.  Negative temporal artrtery bx   Wears hearing aid in both ears     Past Surgical History:  Procedure Laterality Date   ABDOMINOPLASTY     Carotid Doppler Ultrasound  08/22/2014   [facility]; Minimal plaque right. Minimal thisckening left Anterograde flow vertebrals   CATARACT EXTRACTION W/PHACO Right 07/03/2022   Procedure: CATARACT EXTRACTION PHACO AND INTRAOCULAR LENS PLACEMENT (IOC) RIGHT;  Surgeon: Eulogio Bear, MD;  Location: [place];  Service: Ophthalmology;  Laterality: Right;  Diabetic 2.36 00:18.8   CATARACT EXTRACTION W/PHACO Left 07/17/2022   Procedure: CATARACT EXTRACTION PHACO AND INTRAOCULAR LENS PLACEMENT ([place]) LEFT DIABETIC;  Surgeon: Eulogio Bear, MD;  Location: [place];  Service: Ophthalmology;  Laterality: Left;  Diabetic 3.06 00:23.1   COLONOSCOPY WITH PROPOFOL N/A 02/13/2018   Procedure: COLONOSCOPY WITH PROPOFOL;  Surgeon: Manya Silvas, MD;  Location[facility] ENDOSCOPY;  Service: Endoscopy;  Laterality: N/A;   CT of the head  08/22/2014   [facility]. Normal   DOPPLER ECHOCARDIOGRAPHY  08/29/2014   EF=60-65%. Normal LVEF   Myocardial Perfusion Scan  09/10/2012   Poor exercsie tolerance, but no evidence of stress induced myocardial ischemia. EF=66%. Dr.  Callwood   NASAL SEPTOPLASTY W/ TURBINOPLASTY Bilateral 02/10/2016   Procedure: NASAL SEPTOPLASTY WITH INFERIOR TURBINATE REDUCTION;  Surgeon: Carloyn Manner, MD;  Location: [facility] ORS;  Service: ENT;  Laterality: Bilateral;   pannectomy     TEMPORAL ARTERY BIOPSY / LIGATION Left     Family History  Problem Relation Age of Onset   Colon cancer Mother    Coronary artery disease Mother    Prostate cancer Father    Kidney disease Father    Dementia Brother    Diabetes Neg Hx     Social History   Socioeconomic History   Marital status: Married    Spouse name: Not on file   Number of children: 1   Years of education: Not on file   Highest education level: Some college, no degree   Occupational History   Occupation: Retired    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, 2017  Tobacco Use   Smoking status: Never   Smokeless tobacco: Never  Vaping Use   Vaping Use: Never used  Substance and Sexual Activity   Alcohol use: Yes    Alcohol/week: 0.0 standard drinks of alcohol    Comment: Rare - 1 drink   Drug use: No   Sexual activity: Not on file  Other Topics Concern   Not on file  Social History Narrative   Not on file   Social Determinants of Health   Financial Resource Strain: Low Risk  (03/14/2021)   Overall Financial Resource Strain (CARDIA)    Difficulty of Paying Living Expenses: Not hard at all  Food Insecurity: No Food Insecurity (03/14/2021)   Hunger Vital Sign    Worried About Running Out of Food in the Last Year: Never true    Ran Out of Food in the Last Year: Never true  Transportation Needs: No Transportation Needs (03/14/2021)   PRAPARE - Hydrologist (Medical): No    Lack of Transportation (Non-Medical): No  Physical Activity: Insufficiently Active (03/14/2021)   Exercise Vital Sign    Days of Exercise per Week: 1 day    Minutes of Exercise per Session: 10 min  Stress: Stress Concern Present (03/14/2021)   [place]    Feeling of Stress : To some extent  Social Connections: Moderately Integrated (03/14/2021)   Social Connection and Isolation Panel [NHANES]    Frequency of Communication with Friends and Family: More than three times a week    Frequency of Social Gatherings with Friends and Family: Three times a week    Attends Religious Services: More than 4 times per year    Active Member of Clubs or Organizations: No    Attends Archivist Meetings: Never    Marital Status: Married  Human resources officer Violence: Not At Risk (03/14/2021)   Humiliation, Afraid, Rape, and Kick questionnaire    Fear of Current or Ex-Partner:  No    Emotionally Abused: No    Physically Abused: No    Sexually Abused: No    Outpatient Medications Prior to Visit  Medication Sig Dispense Refill   buPROPion (WELLBUTRIN SR) 150 MG 12 hr tablet Take 150 mg by mouth daily.     Coenzyme Q10 (COQ-10) 100 MG CAPS Take 100 mg by mouth daily.     Cyanocobalamin (VITAMIN B-12 PO) Take 1,000 mcg by mouth daily at 6 (six) AM.     glucose blood (CONTOUR NEXT TEST) test  strip Use as instructed to check blood sugar daily 100 each 4   metFORMIN (GLUCOPHAGE-XR) 500 MG 24 hr tablet TAKE 2 TABLETS BY MOUTH TWICE A DAY 360 tablet 4   mupirocin ointment (BACTROBAN) 2 % Apply 1 Application topically 2 (two) times daily for 10 days. 20 g 0   pravastatin (PRAVACHOL) 40 MG tablet TAKE 1 TABLET (40 MG TOTAL) BY MOUTH DAILY WITH LUNCH. 90 tablet 3   valsartan-hydrochlorothiazide (DIOVAN-HCT) 80-12.5 MG tablet TAKE 1 TABLET BY MOUTH EVERY DAY 90 tablet 0   venlafaxine XR (EFFEXOR-XR) 150 MG 24 hr capsule Take 150 mg by mouth daily with breakfast.     Vitamin D, Cholecalciferol, 1000 units TABS Take 2,000 Units by mouth daily.     aspirin EC 81 MG tablet Take 81 mg by mouth daily. Swallow whole.     Dulaglutide (TRULICITY) 5.69 VX/4.8AX SOPN Inject 0.75 mg into the skin once a week. 2 mL 3   gabapentin (NEURONTIN) 300 MG capsule TAKE 2 CAPSULES BY MOUTH IN THE MORNING AND 3 CAPSULES AT BEDTIME 450 capsule 3   No facility-administered medications prior to visit.    No Known Allergies      Objective:    Physical Exam Constitutional:      General: He is not in acute distress.    Appearance: Normal appearance. He is obese. He is not ill-appearing, toxic-appearing or diaphoretic.  Cardiovascular:     Rate and Rhythm: Normal rate and regular rhythm.  Pulmonary:     Effort: Pulmonary effort is normal.  Abdominal:     Tenderness: There is no abdominal tenderness.  Musculoskeletal:     Right lower leg: 1+ Pitting Edema present.     Left lower leg: 1+  Pitting Edema present.  Neurological:     Mental Status: He is alert.     BP 132/82   Pulse 91   Temp 98.6 F (37 C)   Resp 16   Ht 5' 11"  (1.803 m)   Wt 286 lb 4 oz (129.8 kg)   SpO2 98%   BMI 39.92 kg/m  Wt Readings from Last 3 Encounters:  09/20/22 286 lb 4 oz (129.8 kg)  09/13/22 297 lb (134.7 kg)  07/17/22 294 lb 1.6 oz (133.4 kg)     Health Maintenance Due  Topic Date Due   FOOT EXAM  Never done   Zoster Vaccines- Shingrix (1 of 2) Never done   COVID-19 Vaccine (4 - Pfizer risk series) 01/12/2021   COLONOSCOPY (Pts 45-30yr Insurance coverage will need to be confirmed)  02/13/2021    There are no preventive care reminders to display for this patient.  Lab Results  Component Value Date   TSH 2.230 05/05/2022   Lab Results  Component Value Date   WBC 7.2 09/13/2022   HGB 13.6 09/13/2022   HCT 42.2 09/13/2022   MCV 86.8 09/13/2022   PLT 227.0 09/13/2022   Lab Results  Component Value Date   NA 139 09/13/2022   K 4.2 09/13/2022   CO2 32 09/13/2022   GLUCOSE 158 (H) 09/13/2022   BUN 19 09/13/2022   CREATININE 1.28 09/13/2022   BILITOT 0.3 09/13/2022   ALKPHOS 85 09/13/2022   AST 16 09/13/2022   ALT 17 09/13/2022   PROT 6.9 09/13/2022   ALBUMIN 3.7 09/13/2022   CALCIUM 9.2 09/13/2022   ANIONGAP 8 05/30/2022   EGFR 58 (L) 05/05/2022   GFR 56.12 (L) 09/13/2022   Lab Results  Component Value Date  CHOL 146 09/13/2022   Lab Results  Component Value Date   HDL 49.20 09/13/2022   Lab Results  Component Value Date   LDLCALC 76 09/13/2022   Lab Results  Component Value Date   TRIG 106.0 09/13/2022   Lab Results  Component Value Date   CHOLHDL 3 09/13/2022   Lab Results  Component Value Date   HGBA1C 8.2 (H) 09/13/2022      Assessment & Plan:   Problem List Items Addressed This Visit       Endocrine   RESOLVED: Diabetes mellitus ([place])   Relevant Medications   aspirin EC 81 MG tablet   Dulaglutide (TRULICITY) 6.59 DJ/5.7SV SOPN      Genitourinary   Urinary retention    Urine culture ordered today as well as urinalysis to rule out UTI and/or other ideology If all symptoms are negative may consider referral to urology for nighttime symptoms      Relevant Orders   PSA   Urine Culture   Urinalysis, Routine w reflex microscopic   POCT Urinalysis Dipstick (Automated) (Completed)     Other   History of prostate cancer    Repeat PSA today as lower urinary symptoms      Relevant Orders   PSA   Gasping for breath    Pt to call to make appt for sleep study provided with number.       Laceration of left upper extremity    Improving. Advised to continue please monitor site for worsening signs/symptoms of infection to include: increasing redness, increasing tenderness, increase in size, and or pustulant drainage from site. If this is to occur please let me know immediately.        History of arterial ischemic stroke - Primary    Continue daily low-dose aspirin      Relevant Medications   aspirin EC 81 MG tablet   Other Visit Diagnoses     Snoring           Meds ordered this encounter  Medications   aspirin EC 81 MG tablet    Sig: Take 1 tablet (81 mg total) by mouth daily. Swallow whole.    Dispense:  90 tablet    Refill:  1    Order Specific Question:   Supervising Provider    Answer:   BEDSOLE, [name] E [[id number]]   gabapentin (NEURONTIN) 300 MG capsule    Sig: Take one tablet po qhs    Dispense:  450 capsule    Refill:  3    Order Specific Question:   Supervising Provider    Answer:   BEDSOLE, [name] E [[id number]]   Dulaglutide (TRULICITY) 7.79 TJ/0.3ES SOPN    Sig: Inject 0.75 mg into the skin once a week.    Dispense:  2 mL    Refill:  3    Order Specific Question:   Supervising Provider    Answer:   BEDSOLE, [name] E [[id number]]    Follow-up: Return in about 3 months (around 12/21/2022) for f/u diabetes.    Edward Pancoast, FNP

## 2022-09-20 NOTE — Assessment & Plan Note (Signed)
Pt to call to make appt for sleep study provided with number.

## 2022-09-20 NOTE — Assessment & Plan Note (Signed)
Patient recently restarted Trulicity advised patient to continue 1.75 mg once weekly Trulicity dose and we will repeat A1c in 3 months.  Continue metformin 1000 mg twice daily.  Advised to work on diabetic diet and exercise as tolerated

## 2022-09-21 ENCOUNTER — Telehealth: Payer: Self-pay

## 2022-09-21 NOTE — Telephone Encounter (Signed)
[facility] - Client TELEPHONE ADVICE RECORD [facility] Patient Name: Sherald Barge Gender: Unknown DOB: [birth year] Age: 71 Y 4 D Return Phone Number: [phone] (Primary) Address: City/ State/ Zip: [place]  [address] Client [place] Night - Client Client Site [place] - Night Contact Type Call Who Is Calling Patient / Member / Family / Caregiver Call Type Triage / Clinical Relationship To Patient Self Return Phone Number [phone] (Primary) Chief Complaint Anxiety and Panic Attack Reason for Call Symptomatic / Request for Health Information Initial Comment Caller needs to get his Rx refilled. Caller also would like some medicine for his stress level. Translation No Nurse Assessment Nurse: Ellery Plunk, RN, Danica Date/Time (Eastern Time): 09/20/2022 7:39:35 PM Confirm and document reason for call. If symptomatic, describe symptoms. ---caller states he was seen in the office today and forgot to ask for a refill of his gabapentin. has 5 left. also spoke with her pcp about starting a different medicine that does the same thing, wondering what that was and if he should get that instead. also has been having anxiety for a few weeks now that is getting worse Does the patient have any new or worsening symptoms? ---Yes Will a triage be completed? ---Yes Related visit to physician within the last 2 weeks? ---Yes Does the PT have any chronic conditions? (i.e. diabetes, asthma, this includes High risk factors for pregnancy, etc.) ---Yes List chronic conditions. ---diabetes Is this a behavioral health or substance abuse call? ---No Guidelines Guideline Title Affirmed Question Affirmed Notes Nurse Date/Time (Eastern Time) Anxiety and Panic Attack MODERATE anxiety (e.g., persistent or frequent anxiety symptoms; interferes with sleep, school, or work) Restaurant manager, fast food, Therapist, sports, [place] 09/20/2022  7:42:57 PM PLEASE NOTE: All timestamps contained within this report are represented as [place] Standard Time. CONFIDENTIALTY NOTICE: This fax transmission is intended only for the addressee. It contains information that is legally privileged, confidential or otherwise protected from use or disclosure. If you are not the intended recipient, you are strictly prohibited from reviewing, disclosing, copying using or disseminating any of this information or taking any action in reliance on or regarding this information. If you have received this fax in error, please notify us immediately by telephone so that we can arrange for its return to [place]. Phone: [phone], Toll-Free: [phone], Fax: [phone] Page: 2 of 2 Call Id: [id number] [place]. Time Eilene Ghazi Time) Disposition Final User 09/20/2022 7:44:38 PM SEE PCP WITHIN 3 DAYS Yes Bringas, RN, Danica Final Disposition 09/20/2022 7:44:38 PM SEE PCP WITHIN 3 DAYS Yes Bringas, RN, Danica Caller Disagree/Comply Comply Caller Understands Yes PreDisposition InappropriateToAsk Care Advice Given Per Guideline SEE PCP WITHIN 3 DAYS: * You need to be seen within 2 or 3 days. * PCP VISIT: Call your doctor (or NP/PA) during regular office hours and make an appointment. A clinic or urgent care center are good places to go for care if your doctor's office is closed or you can't get an appointment. NOTE: If office will be open tomorrow, tell caller to call then, not in 3 days. CARE ADVICE given per Anxiety and Panic Attack (Adult) guideline

## 2022-09-22 ENCOUNTER — Other Ambulatory Visit: Payer: Self-pay | Admitting: Family

## 2022-09-22 DIAGNOSIS — E1149 Type 2 diabetes mellitus with other diabetic neurological complication: Secondary | ICD-10-CM

## 2022-09-22 LAB — URINE CULTURE
MICRO NUMBER:: 14007091
Result:: NO GROWTH
SPECIMEN QUALITY:: ADEQUATE

## 2022-09-22 MED ORDER — GABAPENTIN 300 MG PO CAPS: ORAL_CAPSULE | ORAL | 2 refills | Status: DC

## 2022-09-22 NOTE — Telephone Encounter (Signed)
Refilled gabapentin.  Pt stated worried about increased anxiety, confirm he does see psychiatry. He will have to f/u with psychiatry for any medications related to his anxiety/stress.

## 2022-10-10 DIAGNOSIS — F331 Major depressive disorder, recurrent, moderate: Secondary | ICD-10-CM | POA: Diagnosis not present

## 2022-10-13 ENCOUNTER — Ambulatory Visit: Payer: PPO | Admitting: Family

## 2022-10-16 DIAGNOSIS — Z8673 Personal history of transient ischemic attack (TIA), and cerebral infarction without residual deficits: Secondary | ICD-10-CM | POA: Diagnosis not present

## 2022-10-16 DIAGNOSIS — F419 Anxiety disorder, unspecified: Secondary | ICD-10-CM | POA: Diagnosis not present

## 2022-10-16 DIAGNOSIS — R2689 Other abnormalities of gait and mobility: Secondary | ICD-10-CM | POA: Diagnosis not present

## 2022-10-16 DIAGNOSIS — G4733 Obstructive sleep apnea (adult) (pediatric): Secondary | ICD-10-CM | POA: Diagnosis not present

## 2022-10-16 DIAGNOSIS — Z8739 Personal history of other diseases of the musculoskeletal system and connective tissue: Secondary | ICD-10-CM | POA: Diagnosis not present

## 2022-10-16 DIAGNOSIS — G3184 Mild cognitive impairment, so stated: Secondary | ICD-10-CM | POA: Diagnosis not present

## 2022-10-16 DIAGNOSIS — F32A Depression, unspecified: Secondary | ICD-10-CM | POA: Diagnosis not present

## 2022-10-17 ENCOUNTER — Ambulatory Visit (INDEPENDENT_AMBULATORY_CARE_PROVIDER_SITE_OTHER): Payer: PPO | Admitting: Family

## 2022-10-17 VITALS — BP 132/78 | HR 80 | Temp 98.2°F | Resp 16 | Ht 71.0 in | Wt 286.2 lb

## 2022-10-17 DIAGNOSIS — E1149 Type 2 diabetes mellitus with other diabetic neurological complication: Secondary | ICD-10-CM | POA: Diagnosis not present

## 2022-10-17 DIAGNOSIS — R5383 Other fatigue: Secondary | ICD-10-CM

## 2022-10-17 NOTE — Progress Notes (Signed)
Established Patient Office Visit  Subjective:  Patient ID: Edward King, male    DOB: 1951/02/02  Age: 71 y.o. MRN: [id number]  CC:  Chief Complaint  Patient presents with   Diabetes    HPI Edward King is here today for follow up.   DM2: taking metformin 1000 mg twice daily and also taking trulicity 1.22 mg once weekly. Does check sugar at home sometimes. Checked it last two days ago, it was 140 but this was in the afternoon. He otherwise doesn't check it regularly. When he does check it fasting he states it is usually around 120 or so.  Wt Readings from Last 3 Encounters:  10/17/22 286 lb 4 oz (129.8 kg)  09/20/22 286 lb 4 oz (129.8 kg)  09/13/22 297 lb (134.7 kg)   Lab Results  Component Value Date   HGBA1C 8.2 (H) 09/13/2022   Snoring: was called about setting up a sleep study. He does have restless sleep hard to go back to sleep.   H/o stroke: on daily asa 81 mg , saw neurology yesterday. Does have to f/u with psychiatrist at [facility] due to worsening anxiety. He plans to make an appt.   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  mostly left   Neuromuscular disorder ([place])    Prostate CA ([place]) 2016   Radiation seed implants.   Stroke [facility])    Temporal arteritis ([place])    Clinically dx Dr. Jefm Bryant. Negative temporal artrtery bx   Wears hearing aid in both ears     Past Surgical History:  Procedure Laterality Date   ABDOMINOPLASTY     Carotid Doppler Ultrasound  08/22/2014   [facility]; Minimal plaque right. Minimal thisckening left Anterograde flow vertebrals   CATARACT EXTRACTION W/PHACO Right 07/03/2022   Procedure: CATARACT EXTRACTION PHACO AND INTRAOCULAR LENS  PLACEMENT (IOC) RIGHT;  Surgeon: Eulogio Bear, MD;  Location: [place];  Service: Ophthalmology;  Laterality: Right;  Diabetic 2.36 00:18.8   CATARACT EXTRACTION W/PHACO Left 07/17/2022   Procedure: CATARACT EXTRACTION PHACO AND INTRAOCULAR LENS PLACEMENT ([place]) LEFT DIABETIC;  Surgeon: Eulogio Bear, MD;  Location: [place];  Service: Ophthalmology;  Laterality: Left;  Diabetic 3.06 00:23.1   COLONOSCOPY WITH PROPOFOL N/A 02/13/2018   Procedure: COLONOSCOPY WITH PROPOFOL;  Surgeon: Manya Silvas, MD;  Location: [facility] ENDOSCOPY;  Service: Endoscopy;  Laterality: N/A;   CT of the head  08/22/2014   [facility]. Normal   DOPPLER ECHOCARDIOGRAPHY  08/29/2014   EF=60-65%. Normal LVEF   Myocardial Perfusion Scan  09/10/2012   Poor exercsie tolerance, but no evidence of stress induced myocardial ischemia. EF=66%. Dr. Clayborn Bigness   NASAL SEPTOPLASTY W/ TURBINOPLASTY Bilateral 02/10/2016   Procedure: NASAL SEPTOPLASTY WITH INFERIOR TURBINATE REDUCTION;  Surgeon: Carloyn Manner, MD;  Location: [facility] ORS;  Service: ENT;  Laterality: Bilateral;   pannectomy     TEMPORAL ARTERY BIOPSY / LIGATION Left     Family History  Problem Relation Age of Onset   Colon cancer Mother    Coronary artery disease Mother  Prostate cancer Father    Kidney disease Father    Dementia Brother    Diabetes Neg Hx     Social History   Socioeconomic History   Marital status: Married    Spouse name: Not on file   Number of children: 1   Years of education: Not on file   Highest education level: Some college, no degree  Occupational History   Occupation: Retired    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, 2017  Tobacco Use   Smoking status: Never   Smokeless tobacco: Never  Vaping Use   Vaping Use: Never used  Substance and Sexual Activity   Alcohol use: Yes    Alcohol/week: 0.0 standard drinks of alcohol    Comment: Rare - 1 drink   Drug use: No   Sexual  activity: Not on file  Other Topics Concern   Not on file  Social History Narrative   Not on file   Social Determinants of Health   Financial Resource Strain: Low Risk  (03/14/2021)   Overall Financial Resource Strain (CARDIA)    Difficulty of Paying Living Expenses: Not hard at all  Food Insecurity: No Food Insecurity (03/14/2021)   Hunger Vital Sign    Worried About Running Out of Food in the Last Year: Never true    Ran Out of Food in the Last Year: Never true  Transportation Needs: No Transportation Needs (03/14/2021)   PRAPARE - Hydrologist (Medical): No    Lack of Transportation (Non-Medical): No  Physical Activity: Insufficiently Active (03/14/2021)   Exercise Vital Sign    Days of Exercise per Week: 1 day    Minutes of Exercise per Session: 10 min  Stress: Stress Concern Present (03/14/2021)   [place]    Feeling of Stress : To some extent  Social Connections: Moderately Integrated (03/14/2021)   Social Connection and Isolation Panel [NHANES]    Frequency of Communication with Friends and Family: More than three times a week    Frequency of Social Gatherings with Friends and Family: Three times a week    Attends Religious Services: More than 4 times per year    Active Member of Clubs or Organizations: No    Attends Archivist Meetings: Never    Marital Status: Married  Human resources officer Violence: Not At Risk (03/14/2021)   Humiliation, Afraid, Rape, and Kick questionnaire    Fear of Current or Ex-Partner: No    Emotionally Abused: No    Physically Abused: No    Sexually Abused: No    Outpatient Medications Prior to Visit  Medication Sig Dispense Refill   aspirin EC 81 MG tablet Take 1 tablet (81 mg total) by mouth daily. Swallow whole. 90 tablet 1   buPROPion (WELLBUTRIN SR) 150 MG 12 hr tablet Take 150 mg by mouth daily.     Coenzyme Q10 (COQ-10) 100 MG CAPS Take  100 mg by mouth daily.     Cyanocobalamin (VITAMIN B-12 PO) Take 1,000 mcg by mouth daily at 6 (six) AM.     Dulaglutide (TRULICITY) 6.94 HW/3.8UE SOPN Inject 0.75 mg into the skin once a week. 2 mL 3   gabapentin (NEURONTIN) 300 MG capsule Take one tablet qam, take one tablet mid day, and then take two tablets qhs 120 capsule 2   glucose blood (CONTOUR NEXT TEST) test strip Use as instructed to check blood sugar  daily 100 each 4   metFORMIN (GLUCOPHAGE-XR) 500 MG 24 hr tablet TAKE 2 TABLETS BY MOUTH TWICE A DAY 360 tablet 4   pravastatin (PRAVACHOL) 40 MG tablet TAKE 1 TABLET (40 MG TOTAL) BY MOUTH DAILY WITH LUNCH. 90 tablet 3   valsartan-hydrochlorothiazide (DIOVAN-HCT) 80-12.5 MG tablet TAKE 1 TABLET BY MOUTH EVERY DAY 90 tablet 0   venlafaxine XR (EFFEXOR-XR) 150 MG 24 hr capsule Take 150 mg by mouth daily with breakfast.     Vitamin D, Cholecalciferol, 1000 units TABS Take 2,000 Units by mouth daily.     No facility-administered medications prior to visit.    No Known Allergies        Objective:    Physical Exam Constitutional:      Appearance: Normal appearance. He is obese.  Cardiovascular:     Rate and Rhythm: Normal rate and regular rhythm.  Pulmonary:     Effort: Pulmonary effort is normal.  Neurological:     General: No focal deficit present.     Mental Status: He is alert and oriented to person, place, and time. Mental status is at baseline.  Psychiatric:        Mood and Affect: Mood normal.        Behavior: Behavior normal.        Thought Content: Thought content normal.        Judgment: Judgment normal.       BP 132/78   Pulse 80   Temp 98.2 F (36.8 C)   Resp 16   Ht _0  (1.803 m)   Wt 286 lb 4 oz (129.8 kg)   SpO2 95%   BMI 39.92 kg/m  Wt Readings from Last 3 Encounters:  10/17/22 286 lb 4 oz (129.8 kg)  09/20/22 286 lb 4 oz (129.8 kg)  09/13/22 297 lb (134.7 kg)     Health Maintenance Due  Topic Date Due   FOOT EXAM  Never done    Zoster Vaccines- Shingrix (1 of 2) Never done   COVID-19 Vaccine (4 - Pfizer risk series) 01/12/2021   COLONOSCOPY (Pts 45-39yr Insurance coverage will need to be confirmed)  02/13/2021    There are no preventive care reminders to display for this patient.  Lab Results  Component Value Date   TSH 2.230 05/05/2022   Lab Results  Component Value Date   WBC 7.2 09/13/2022   HGB 13.6 09/13/2022   HCT 42.2 09/13/2022   MCV 86.8 09/13/2022   PLT 227.0 09/13/2022   Lab Results  Component Value Date   NA 139 09/13/2022   K 4.2 09/13/2022   CO2 32 09/13/2022   GLUCOSE 158 (H) 09/13/2022   BUN 19 09/13/2022   CREATININE 1.28 09/13/2022   BILITOT 0.3 09/13/2022   ALKPHOS 85 09/13/2022   AST 16 09/13/2022   ALT 17 09/13/2022   PROT 6.9 09/13/2022   ALBUMIN 3.7 09/13/2022   CALCIUM 9.2 09/13/2022   ANIONGAP 8 05/30/2022   EGFR 58 (L) 05/05/2022   GFR 56.12 (L) 09/13/2022   Lab Results  Component Value Date   CHOL 146 09/13/2022   Lab Results  Component Value Date   HDL 49.20 09/13/2022   Lab Results  Component Value Date   LDLCALC 76 09/13/2022   Lab Results  Component Value Date   TRIG 106.0 09/13/2022   Lab Results  Component Value Date   CHOLHDL 3 09/13/2022   Lab Results  Component Value Date   HGBA1C 8.2 (H) 09/13/2022  Assessment & Plan:   Problem List Items Addressed This Visit       Endocrine   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  a1c to repeat in two months.  Work on diabetic diet exercise as tolerated. If still not at goal will tiral increase trulicity otherwise if not tolerated, will consider adding sglt2 such as jardiance.       Relevant Orders   Fructosamine     Other   Fatigue    Pt to f/u with sleep study as scheduled to r/o need for cpap.        No orders of the defined types were placed in this encounter.   Follow-up: Return in about 2 months (around  12/17/2022) for f/u diabetes.    Eugenia Pancoast, FNP

## 2022-10-17 NOTE — Assessment & Plan Note (Signed)
Pt to f/u with sleep study as scheduled to r/o need for cpap.

## 2022-10-17 NOTE — Assessment & Plan Note (Signed)
Fructosamine today, pending results.  a1c to repeat in two months.  Work on diabetic diet exercise as tolerated. If still not at goal will tiral increase trulicity otherwise if not tolerated, will consider adding sglt2 such as jardiance.

## 2022-10-23 LAB — FRUCTOSAMINE: Fructosamine: 236 umol/L (ref 205–285)

## 2022-11-07 DIAGNOSIS — F331 Major depressive disorder, recurrent, moderate: Secondary | ICD-10-CM | POA: Diagnosis not present

## 2022-11-13 DIAGNOSIS — G4733 Obstructive sleep apnea (adult) (pediatric): Secondary | ICD-10-CM | POA: Diagnosis not present

## 2022-11-13 DIAGNOSIS — G3184 Mild cognitive impairment, so stated: Secondary | ICD-10-CM | POA: Diagnosis not present

## 2022-11-13 DIAGNOSIS — Z9842 Cataract extraction status, left eye: Secondary | ICD-10-CM | POA: Diagnosis not present

## 2022-11-13 DIAGNOSIS — R2689 Other abnormalities of gait and mobility: Secondary | ICD-10-CM | POA: Diagnosis not present

## 2022-11-13 DIAGNOSIS — F419 Anxiety disorder, unspecified: Secondary | ICD-10-CM | POA: Diagnosis not present

## 2022-11-13 DIAGNOSIS — Z8739 Personal history of other diseases of the musculoskeletal system and connective tissue: Secondary | ICD-10-CM | POA: Diagnosis not present

## 2022-11-13 DIAGNOSIS — R531 Weakness: Secondary | ICD-10-CM | POA: Diagnosis not present

## 2022-11-13 DIAGNOSIS — Z8673 Personal history of transient ischemic attack (TIA), and cerebral infarction without residual deficits: Secondary | ICD-10-CM | POA: Diagnosis not present

## 2022-11-13 DIAGNOSIS — Z9841 Cataract extraction status, right eye: Secondary | ICD-10-CM | POA: Diagnosis not present

## 2022-11-16 DIAGNOSIS — H35713 Central serous chorioretinopathy, bilateral: Secondary | ICD-10-CM | POA: Diagnosis not present

## 2022-11-21 ENCOUNTER — Other Ambulatory Visit: Payer: Self-pay | Admitting: Family Medicine

## 2022-11-21 DIAGNOSIS — I152 Hypertension secondary to endocrine disorders: Secondary | ICD-10-CM

## 2022-11-21 NOTE — Telephone Encounter (Signed)
Requested medications are due for refill today.  yes  Requested medications are on the active medications list.  yes  Last refill. 08/21/2022 #90 0 rf  Future visit scheduled.   no  Notes to clinic.  Eugenia Pancoast listed as PCP.    Requested Prescriptions  Pending Prescriptions Disp Refills   valsartan-hydrochlorothiazide (DIOVAN-HCT) 80-12.5 MG tablet [Pharmacy Med Name: VALSARTAN-HCTZ 80-12.5 MG TAB] 90 tablet 0    Sig: TAKE 1 TABLET BY MOUTH EVERY DAY     Cardiovascular: ARB + Diuretic Combos Passed - 11/21/2022  1:13 AM      Passed - K in normal range and within 180 days    Potassium  Date Value Ref Range Status  09/13/2022 4.2 3.5 - 5.1 mEq/L Final  08/30/2014 4.5 3.5 - 5.1 mmol/L Final         Passed - Na in normal range and within 180 days    Sodium  Date Value Ref Range Status  09/13/2022 139 135 - 145 mEq/L Final  05/05/2022 143 134 - 144 mmol/L Final  08/30/2014 132 (L) 136 - 145 mmol/L Final         Passed - Cr in normal range and within 180 days    Creatinine  Date Value Ref Range Status  08/30/2014 1.10 0.60 - 1.30 mg/dL Final   Creatinine, Ser  Date Value Ref Range Status  09/13/2022 1.28 0.40 - 1.50 mg/dL Final   Creatinine, POC  Date Value Ref Range Status  01/24/2019 n/a mg/dL Final         Passed - eGFR is 10 or above and within 180 days    EGFR (African American)  Date Value Ref Range Status  08/30/2014 >60  Final   GFR calc Af Amer  Date Value Ref Range Status  08/17/2020 84 >59 mL/min/1.73 Final    Comment:    **[facility] currently reports eGFR in compliance with the current**   recommendations of the [employer]. [facility] will   update reporting as new guidelines are published from the NKF-ASN   Task force.    EGFR (Non-African Amer.)  Date Value Ref Range Status  08/30/2014 >60  Final    Comment:    eGFR values <64m/min/1.73 m2 may be an indication of chronic kidney disease (CKD). Calculated eGFR is useful in  patients with stable renal function. The eGFR calculation will not be reliable in acutely ill patients when serum creatinine is changing rapidly. It is not useful in  patients on dialysis. The eGFR calculation may not be applicable to patients at the low and high extremes of body sizes, pregnant women, and vegetarians.    GFR, Estimated  Date Value Ref Range Status  05/30/2022 >60 >60 mL/min Final    Comment:    (NOTE) Calculated using the CKD-EPI Creatinine Equation (2021)    GFR  Date Value Ref Range Status  09/13/2022 56.12 (L) >60.00 mL/min Final    Comment:    Calculated using the CKD-EPI Creatinine Equation (2021)   eGFR  Date Value Ref Range Status  05/05/2022 58 (L) >59 mL/min/1.73 Final         Passed - Patient is not pregnant      Passed - Last BP in normal range    BP Readings from Last 1 Encounters:  10/17/22 132/78         Passed - Valid encounter within last 6 months    Recent Outpatient Visits  5 months ago Cough, unspecified type   [facility] Birdie Sons, MD   6 months ago [place], Donald E, MD   6 months ago Acute cough   [facility] Tally Joe T, FNP   7 months ago Primary hypertension   [facility], [place], PA-C   1 year ago Type 2 diabetes mellitus with other neurologic complication, without long-term current use of insulin [facility])   [facility] Birdie Sons, MD       Future Appointments             In 3 weeks Eugenia Pancoast, [place] at [place], [facility]

## 2022-11-23 DIAGNOSIS — H353132 Nonexudative age-related macular degeneration, bilateral, intermediate dry stage: Secondary | ICD-10-CM | POA: Diagnosis not present

## 2022-11-29 DIAGNOSIS — I10 Essential (primary) hypertension: Secondary | ICD-10-CM | POA: Diagnosis not present

## 2022-11-29 DIAGNOSIS — R0602 Shortness of breath: Secondary | ICD-10-CM | POA: Diagnosis not present

## 2022-11-29 DIAGNOSIS — Z8719 Personal history of other diseases of the digestive system: Secondary | ICD-10-CM | POA: Diagnosis not present

## 2022-11-29 DIAGNOSIS — G4730 Sleep apnea, unspecified: Secondary | ICD-10-CM | POA: Diagnosis not present

## 2022-11-29 DIAGNOSIS — I2089 Other forms of angina pectoris: Secondary | ICD-10-CM | POA: Diagnosis not present

## 2022-11-29 DIAGNOSIS — G4733 Obstructive sleep apnea (adult) (pediatric): Secondary | ICD-10-CM | POA: Diagnosis not present

## 2022-11-29 DIAGNOSIS — E119 Type 2 diabetes mellitus without complications: Secondary | ICD-10-CM | POA: Diagnosis not present

## 2022-12-05 ENCOUNTER — Ambulatory Visit: Payer: PPO

## 2022-12-05 ENCOUNTER — Institutional Professional Consult (permissible substitution): Payer: PPO | Admitting: Adult Health

## 2022-12-12 ENCOUNTER — Ambulatory Visit: Payer: PPO

## 2022-12-14 ENCOUNTER — Ambulatory Visit: Payer: PPO

## 2022-12-15 ENCOUNTER — Ambulatory Visit: Payer: PPO | Admitting: Family

## 2022-12-19 NOTE — Therapy (Incomplete)
OUTPATIENT PHYSICAL THERAPY NEURO EVALUATION   Patient Name: Edward King MRN: [id number] DOB:25-Sep-1951, 72 y.o., male Today's Date: 12/19/2022   PCP: Eugenia Pancoast, FNP REFERRING PROVIDER: Jennings Books, MD  END OF SESSION:   Past Medical History:  Diagnosis Date   Arthritis    Cat bite 07/31/2018   Cellulitis of right leg without foot    Colitis    Depression    Diabetes mellitus without complication ([place])    Giant cell arteritis ([place]) 06/15/2015   History of chicken pox    History of measles    History of mumps    Hypertension    Lymph edema    both legs.  mostly left   Neuromuscular disorder ([place])    Prostate CA ([place]) 2016   Radiation seed implants.   Stroke [facility])    Temporal arteritis ([place])    Clinically dx Dr. Jefm Bryant. Negative temporal artrtery bx   Wears hearing aid in both ears    Past Surgical History:  Procedure Laterality Date   ABDOMINOPLASTY     Carotid Doppler Ultrasound  08/22/2014   [facility]; Minimal plaque right. Minimal thisckening left Anterograde flow vertebrals   CATARACT EXTRACTION W/PHACO Right 07/03/2022   Procedure: CATARACT EXTRACTION PHACO AND INTRAOCULAR LENS PLACEMENT (IOC) RIGHT;  Surgeon: Eulogio Bear, MD;  Location: [place];  Service: Ophthalmology;  Laterality: Right;  Diabetic 2.36 00:18.8   CATARACT EXTRACTION W/PHACO Left 07/17/2022   Procedure: CATARACT EXTRACTION PHACO AND INTRAOCULAR LENS PLACEMENT ([place]) LEFT DIABETIC;  Surgeon: Eulogio Bear, MD;  Location: [place];  Service: Ophthalmology;  Laterality: Left;  Diabetic 3.06 00:23.1   COLONOSCOPY WITH PROPOFOL N/A 02/13/2018   Procedure: COLONOSCOPY WITH PROPOFOL;  Surgeon: Manya Silvas, MD;  Location: [facility] ENDOSCOPY;  Service: Endoscopy;  Laterality: N/A;   CT of the head  08/22/2014   [facility]. Normal   DOPPLER ECHOCARDIOGRAPHY  08/29/2014   EF=60-65%. Normal LVEF   Myocardial Perfusion Scan  09/10/2012   Poor exercsie tolerance, but no  evidence of stress induced myocardial ischemia. EF=66%. Dr. Clayborn Bigness   NASAL SEPTOPLASTY W/ TURBINOPLASTY Bilateral 02/10/2016   Procedure: NASAL SEPTOPLASTY WITH INFERIOR TURBINATE REDUCTION;  Surgeon: Carloyn Manner, MD;  Location: [facility] ORS;  Service: ENT;  Laterality: Bilateral;   pannectomy     TEMPORAL ARTERY BIOPSY / LIGATION Left    Patient Active Problem List   Diagnosis Date Noted   History of arterial ischemic stroke 09/20/2022   Mixed hyperlipidemia 09/13/2022   Crohn's disease of colon without complication ([place]) 85/88/5027   Laceration of left upper extremity 09/13/2022   Hypertension associated with diabetes ([place]) 05/05/2022   Coronary atherosclerosis 01/24/2019   Morbid obesity ([place]) 08/27/2018   Chronic colitis 03/15/2018   Hypersomnia 05/11/2017   Fatigue 05/11/2017   Multiple lung nodules on CT 02/29/2016   Hypogonadism in male 06/16/2015   History of prostate cancer 06/16/2015   OSA (obstructive sleep apnea) 06/16/2015   Diabetes mellitus with neurological manifestation ([place]) 05/06/2014    ONSET DATE: ***  REFERRING DIAG: R26.89 (ICD-10-CM) - [place]mb[place]ance   THERAPY DIAG:  No diagnosis found.  Rationale for Evaluation and Treatment: Rehabilitation  SUBJECTIVE:  SUBJECTIVE STATEMENT: *** Pt accompanied by: {accompnied:27141}  PERTINENT HISTORY: ***  PAIN:  Are you having pain? {OPRCPAIN:27236}  PRECAUTIONS: {Therapy precautions:24002}  WEIGHT BEARING RESTRICTIONS: {Yes ***/No:24003}  FALLS: Has patient fallen in last 6 months? {fallsyesno:27318}  LIVING ENVIRONMENT: Lives with: {OPRC lives with:25569::"lives with their family"} Lives in: {Lives in:25570} Stairs: {opstairs:27293} Has following equipment at home: {Assistive devices:23999}  PLOF:  {PLOF:24004}  PATIENT GOALS: ***  OBJECTIVE:   DIAGNOSTIC FINDINGS: ***  COGNITION: Overall cognitive status: {cognition:24006}   SENSATION: {sensation:27233}  COORDINATION: ***  EDEMA:  {edema:24020}  MUSCLE TONE: {LE tone:25568}  MUSCLE LENGTH: Hamstrings: Right *** deg; Left *** deg Thomas test: Right *** deg; Left *** deg  DTRs:  {DTR SITE:24025}  POSTURE: {posture:25561}  LOWER EXTREMITY ROM:     {AROM/PROM:27142}  Right Eval Left Eval  Hip flexion    Hip extension    Hip abduction    Hip adduction    Hip internal rotation    Hip external rotation    Knee flexion    Knee extension    Ankle dorsiflexion    Ankle plantarflexion    Ankle inversion    Ankle eversion     (Blank rows = not tested)  LOWER EXTREMITY MMT:    MMT Right Eval Left Eval  Hip flexion    Hip extension    Hip abduction    Hip adduction    Hip internal rotation    Hip external rotation    Knee flexion    Knee extension    Ankle dorsiflexion    Ankle plantarflexion    Ankle inversion    Ankle eversion    (Blank rows = not tested)  BED MOBILITY:  {Bed mobility:24027}  TRANSFERS: Assistive device utilized: {Assistive devices:23999}  Sit to stand: {Levels of assistance:24026} Stand to sit: {Levels of assistance:24026} Chair to chair: {Levels of assistance:24026} Floor: {Levels of assistance:24026}  RAMP:  Level of Assistance: {Levels of assistance:24026} Assistive device utilized: {Assistive devices:23999} Ramp Comments: ***  CURB:  Level of Assistance: {Levels of assistance:24026} Assistive device utilized: {Assistive devices:23999} Curb Comments: ***  STAIRS: Level of Assistance: {Levels of assistance:24026} Stair Negotiation Technique: {Stair Technique:27161} with {Rail Assistance:27162} Number of Stairs: ***  Height of Stairs: ***  Comments: ***  GAIT: Gait pattern: {gait characteristics:25376} Distance walked: *** Assistive device utilized:  {Assistive devices:23999} Level of assistance: {Levels of assistance:24026} Comments: ***  FUNCTIONAL TESTS:  {Functional tests:24029}  PATIENT SURVEYS:  {rehab surveys:24030}  TODAY'S TREATMENT: DATE: ***    PATIENT EDUCATION: Education details: *** Person educated: {Person educated:25204} Education method: {Education Method:25205} Education comprehension: {Education Comprehension:25206}  HOME EXERCISE PROGRAM: ***   GOALS: Goals reviewed with patient? {yes/no:20286}  SHORT TERM GOALS: Target date: ***  *** Baseline: Goal status: {GOALSTATUS:25110}  2.  *** Baseline:  Goal status: {GOALSTATUS:25110}  3.  *** Baseline:  Goal status: {GOALSTATUS:25110}  4.  *** Baseline:  Goal status: {GOALSTATUS:25110}  5.  *** Baseline:  Goal status: {GOALSTATUS:25110}  6.  *** Baseline:  Goal status: {GOALSTATUS:25110}  LONG TERM GOALS: Target date: ***  *** Baseline:  Goal status: {GOALSTATUS:25110}  2.  *** Baseline:  Goal status: {GOALSTATUS:25110}  3.  *** Baseline:  Goal status: {GOALSTATUS:25110}  4.  *** Baseline:  Goal status: {GOALSTATUS:25110}  5.  *** Baseline:  Goal status: {GOALSTATUS:25110}  6.  *** Baseline:  Goal status: {GOALSTATUS:25110}   ASSESSMENT:  CLINICAL IMPRESSION: Patient is a *** y.o. *** who was seen today for physical therapy evaluation and treatment for ***.  OBJECTIVE IMPAIRMENTS: {opptimpairments:25111}.   ACTIVITY LIMITATIONS: {activitylimitations:27494}  PARTICIPATION LIMITATIONS: {participationrestrictions:25113}  PERSONAL FACTORS: {Personal factors:25162} are also affecting patient's functional outcome.   REHAB POTENTIAL: {rehabpotential:25112}  CLINICAL DECISION MAKING: {clinical decision making:25114}  EVALUATION COMPLEXITY: {Evaluation complexity:25115}   PLAN:  PT FREQUENCY: {rehab frequency:25116}  PT DURATION: {rehab duration:25117}  PLANNED INTERVENTIONS: {rehab planned  interventions:25118::"Therapeutic exercises","Therapeutic activity","Neuromuscular re-education","Balance training","Gait training","Patient/Family education","Self Care","Joint mobilization"}  PLAN FOR NEXT SESSION: ***   Christie Nottingham, PT 12/19/2022, 5:50 PM

## 2022-12-20 ENCOUNTER — Ambulatory Visit: Payer: PPO

## 2022-12-20 ENCOUNTER — Telehealth: Payer: Self-pay

## 2022-12-20 NOTE — Telephone Encounter (Signed)
[place] J Driver  to [employer] (supporting Eugenia Pancoast, FNP)      12/19/22  5:48 PM Edward King is having trouble getting in to [place] and needs to get a referral to have a Colonscopy. You are sending me to [facility].  Can you help?  He can be reached at [phone].    Pts wife (DPR signed) said that pt could not get into my chart so pt's wife sent note in her chart which I am transferring to pts chart now.    I spoke with pt and he said he was behind on getting colonoscopy; pt not having any GI issues at this time; no abd pain and no blood in BM. Sent this message to pts chart. Pt already has appt with Red Christians FNP scheduled for 12/21/22 at 12;20 and UC & ED precautions given and pt voiced understanding and will discuss colonoscopy at appt on 12/21/22. Sending note to Red Christians FNP as Juluis Rainier.

## 2022-12-20 NOTE — Telephone Encounter (Signed)
Agree with precautions given to pt  Agree with nurse assessment in plan.  Thank you for speaking with them. 

## 2022-12-21 ENCOUNTER — Ambulatory Visit (INDEPENDENT_AMBULATORY_CARE_PROVIDER_SITE_OTHER): Payer: PPO | Admitting: Family

## 2022-12-21 ENCOUNTER — Other Ambulatory Visit: Payer: Self-pay | Admitting: Family Medicine

## 2022-12-21 ENCOUNTER — Encounter: Payer: Self-pay | Admitting: Family

## 2022-12-21 ENCOUNTER — Other Ambulatory Visit: Payer: Self-pay | Admitting: Family

## 2022-12-21 VITALS — BP 118/72 | HR 80 | Ht 71.0 in | Wt 287.0 lb

## 2022-12-21 DIAGNOSIS — R0989 Other specified symptoms and signs involving the circulatory and respiratory systems: Secondary | ICD-10-CM | POA: Diagnosis not present

## 2022-12-21 DIAGNOSIS — Z1211 Encounter for screening for malignant neoplasm of colon: Secondary | ICD-10-CM

## 2022-12-21 DIAGNOSIS — R399 Unspecified symptoms and signs involving the genitourinary system: Secondary | ICD-10-CM

## 2022-12-21 DIAGNOSIS — Z800 Family history of malignant neoplasm of digestive organs: Secondary | ICD-10-CM

## 2022-12-21 DIAGNOSIS — E1159 Type 2 diabetes mellitus with other circulatory complications: Secondary | ICD-10-CM | POA: Diagnosis not present

## 2022-12-21 DIAGNOSIS — B351 Tinea unguium: Secondary | ICD-10-CM | POA: Diagnosis not present

## 2022-12-21 DIAGNOSIS — E1149 Type 2 diabetes mellitus with other diabetic neurological complication: Secondary | ICD-10-CM

## 2022-12-21 DIAGNOSIS — G3184 Mild cognitive impairment, so stated: Secondary | ICD-10-CM

## 2022-12-21 DIAGNOSIS — I152 Hypertension secondary to endocrine disorders: Secondary | ICD-10-CM

## 2022-12-21 DIAGNOSIS — R600 Localized edema: Secondary | ICD-10-CM | POA: Diagnosis not present

## 2022-12-21 DIAGNOSIS — R944 Abnormal results of kidney function studies: Secondary | ICD-10-CM | POA: Diagnosis not present

## 2022-12-21 LAB — HEMOGLOBIN A1C: Hgb A1c MFr Bld: 7.5 % — ABNORMAL HIGH (ref 4.6–6.5)

## 2022-12-21 LAB — COMPREHENSIVE METABOLIC PANEL
ALT: 14 U/L (ref 0–53)
AST: 16 U/L (ref 0–37)
Albumin: 3.8 g/dL (ref 3.5–5.2)
Alkaline Phosphatase: 84 U/L (ref 39–117)
BUN: 21 mg/dL (ref 6–23)
CO2: 34 mEq/L — ABNORMAL HIGH (ref 19–32)
Calcium: 9.5 mg/dL (ref 8.4–10.5)
Chloride: 100 mEq/L (ref 96–112)
Creatinine, Ser: 1.2 mg/dL (ref 0.40–1.50)
GFR: 60.52 mL/min (ref >60.00)
Glucose, Bld: 156 mg/dL — ABNORMAL HIGH (ref 70–99)
Potassium: 4.4 mEq/L (ref 3.5–5.1)
Sodium: 140 mEq/L (ref 135–145)
Total Bilirubin: 0.4 mg/dL (ref 0.2–1.2)
Total Protein: 6.9 g/dL (ref 6.0–8.3)

## 2022-12-21 LAB — MICROALBUMIN / CREATININE URINE RATIO
Creatinine,U: 91.9 mg/dL
Microalb Creat Ratio: 0.8 mg/g (ref 0.0–30.0)
Microalb, Ur: <0.7 mg/dL (ref 0.0–1.9)

## 2022-12-21 MED ORDER — EMPAGLIFLOZIN 10 MG PO TABS: 10.0000 mg | ORAL_TABLET | Freq: Every day | ORAL | 1 refills | Status: DC

## 2022-12-21 MED ORDER — CICLOPIROX 8 % EX SOLN: Freq: Every day | CUTANEOUS | 0 refills | Status: DC

## 2022-12-21 NOTE — Progress Notes (Signed)
Established Patient Office Visit  Subjective:  Patient ID: Edward King, male    DOB: Sep 10, 1951  Age: 72 y.o. MRN: [id number]  CC:  Chief Complaint  Patient presents with   Diabetes    HPI Edward King is here today for follow up.   Pt is with acute concerns.  Memory concerns: worse in the more recent months. Forgetting where he put things on more than occasion, forgets name, and appointment times such as that. Accompanied by wife who also confirms has been forgetting items more often.   Starts physical therapy next week for h/o stroke.  Does see neurology and last visit 11/13/22 with Dr. Trena Platt PA. They did address mild cognitive impairment they have set him up for physical therapy as well as speech therapy.  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   OSA: has sleep study next week for consult. He states unable to tolerate CPAP in the past.   Diabetes: last eye exam 06/14/22.  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  mostly left   Neuromuscular disorder ([place])    Prostate CA ([place]) 2016   Radiation seed implants.   Stroke [facility])    Temporal arteritis ([place])    Clinically dx Dr. Jefm Bryant. Negative temporal artrtery bx   Wears hearing aid in both ears     Past Surgical History:  Procedure Laterality Date   ABDOMINOPLASTY     Carotid Doppler Ultrasound  08/22/2014   [facility]; Minimal plaque right. Minimal thisckening left Anterograde flow vertebrals   CATARACT EXTRACTION W/PHACO Right 07/03/2022   Procedure: CATARACT EXTRACTION PHACO AND INTRAOCULAR LENS PLACEMENT (IOC) RIGHT;  Surgeon: Eulogio Bear, MD;  Location: [place];  Service: Ophthalmology;  Laterality: Right;  Diabetic 2.36 00:18.8   CATARACT EXTRACTION W/PHACO Left 07/17/2022   Procedure: CATARACT EXTRACTION PHACO AND INTRAOCULAR LENS PLACEMENT ([place]) LEFT DIABETIC;  Surgeon: Eulogio Bear, MD;  Location: [place];  Service: Ophthalmology;  Laterality: Left;  Diabetic 3.06 00:23.1   COLONOSCOPY WITH PROPOFOL N/A 02/13/2018   Procedure: COLONOSCOPY WITH PROPOFOL;  Surgeon: Manya Silvas, MD;  Location: [facility] ENDOSCOPY;  Service: Endoscopy;  Laterality: N/A;   CT of the head  08/22/2014   [facility]. Normal   DOPPLER ECHOCARDIOGRAPHY  08/29/2014   EF=60-65%. Normal LVEF   Myocardial Perfusion Scan  09/10/2012   Poor exercsie tolerance, but no evidence of stress induced myocardial ischemia. EF=66%. Dr. Clayborn Bigness   NASAL SEPTOPLASTY W/ TURBINOPLASTY Bilateral 02/10/2016   Procedure: NASAL SEPTOPLASTY WITH INFERIOR TURBINATE REDUCTION;  Surgeon: Carloyn Manner, MD;  Location: [facility] ORS;  Service: ENT;  Laterality: Bilateral;   pannectomy     TEMPORAL ARTERY BIOPSY / LIGATION Left     Family History  Problem Relation Age of Onset   Colon cancer Mother    Coronary artery disease Mother    Prostate cancer Father    Kidney disease Father    Dementia Brother    Diabetes Neg Hx  Social History   Socioeconomic History   Marital status: Married    Spouse name: Not on file   Number of children: 1   Years of education: Not on file   Highest education level: Some college, no degree  Occupational History   Occupation: Retired    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, 2017  Tobacco Use   Smoking status: Never   Smokeless tobacco: Never  Vaping Use   Vaping Use: Never used  Substance and Sexual Activity   Alcohol use: Yes    Alcohol/week: 0.0 standard drinks of alcohol    Comment: Rare - 1 drink   Drug use: No   Sexual activity: Not on file  Other Topics Concern    Not on file  Social History Narrative   Not on file   Social Determinants of Health   Financial Resource Strain: Low Risk  (03/14/2021)   Overall Financial Resource Strain (CARDIA)    Difficulty of Paying Living Expenses: Not hard at all  Food Insecurity: No Food Insecurity (03/14/2021)   Hunger Vital Sign    Worried About Running Out of Food in the Last Year: Never true    Ran Out of Food in the Last Year: Never true  Transportation Needs: No Transportation Needs (03/14/2021)   PRAPARE - Hydrologist (Medical): No    Lack of Transportation (Non-Medical): No  Physical Activity: Insufficiently Active (03/14/2021)   Exercise Vital Sign    Days of Exercise per Week: 1 day    Minutes of Exercise per Session: 10 min  Stress: Stress Concern Present (03/14/2021)   [place]    Feeling of Stress : To some extent  Social Connections: Moderately Integrated (03/14/2021)   Social Connection and Isolation Panel [NHANES]    Frequency of Communication with Friends and Family: More than three times a week    Frequency of Social Gatherings with Friends and Family: Three times a week    Attends Religious Services: More than 4 times per year    Active Member of Clubs or Organizations: No    Attends Archivist Meetings: Never    Marital Status: Married  Human resources officer Violence: Not At Risk (03/14/2021)   Humiliation, Afraid, Rape, and Kick questionnaire    Fear of Current or Ex-Partner: No    Emotionally Abused: No    Physically Abused: No    Sexually Abused: No    Outpatient Medications Prior to Visit  Medication Sig Dispense Refill   aspirin EC 81 MG tablet Take 1 tablet (81 mg total) by mouth daily. Swallow whole. 90 tablet 1   buPROPion (WELLBUTRIN SR) 150 MG 12 hr tablet Take 150 mg by mouth daily.     clonazePAM (KLONOPIN) 0.5 MG tablet Take 0.5 mg by mouth 3 (three) times daily as needed  for anxiety.     Coenzyme Q10 (COQ-10) 100 MG CAPS Take 100 mg by mouth daily.     Cyanocobalamin (VITAMIN B-12 PO) Take 1,000 mcg by mouth daily at 6 (six) AM.     Dulaglutide (TRULICITY) 1.54 MG/8.6PY SOPN Inject 0.75 mg into the skin once a week. 2 mL 3   gabapentin (NEURONTIN) 300 MG capsule Take one tablet qam, take one tablet mid day, and then take two tablets qhs 120 capsule 2   glucose blood (CONTOUR NEXT TEST) test strip Use as instructed to check blood sugar daily 100  each 4   metFORMIN (GLUCOPHAGE-XR) 500 MG 24 hr tablet TAKE 2 TABLETS BY MOUTH TWICE A DAY 360 tablet 4   mirtazapine (REMERON) 30 MG tablet Take 30 mg by mouth at bedtime.     pravastatin (PRAVACHOL) 40 MG tablet TAKE 1 TABLET (40 MG TOTAL) BY MOUTH DAILY WITH LUNCH. 90 tablet 3   venlafaxine XR (EFFEXOR-XR) 150 MG 24 hr capsule Take 150 mg by mouth daily with breakfast.     Vitamin D, Cholecalciferol, 1000 units TABS Take 2,000 Units by mouth daily.     valsartan-hydrochlorothiazide (DIOVAN-HCT) 80-12.5 MG tablet TAKE 1 TABLET BY MOUTH EVERY DAY 90 tablet 0   No facility-administered medications prior to visit.    No Known Allergies        Objective:    Physical Exam Constitutional:      Appearance: Normal appearance. He is obese.  HENT:     Head: Normocephalic.  Cardiovascular:     Rate and Rhythm: Normal rate and regular rhythm.     Pulses: Decreased pulses.          Dorsalis pedis pulses are 1+ on the right side and 1+ on the left side.       Posterior tibial pulses are 1+ on the right side and 1+ on the left side.     Comments: Purple discoloration in bil toes and base of feet Pulmonary:     Effort: Pulmonary effort is normal.  Musculoskeletal:     Right lower leg: 2+ Pitting Edema present.     Left lower leg: 2+ Pitting Edema present.  Neurological:     General: No focal deficit present.     Mental Status: He is alert and oriented to person, place, and time. Mental status is at baseline.      Motor: No weakness.  Psychiatric:        Mood and Affect: Mood normal.        Behavior: Behavior normal.        Thought Content: Thought content normal.        Judgment: Judgment normal.     Diabetic Foot Form - Detailed   Diabetic Foot Exam - detailed Can the patient see the bottom of their feet?: No Are the shoes appropriate in style and fit?: Yes Is there swelling or and abnormal foot shape?: Yes Is there a claw toe deformity?: No Is there elevated skin temparature?: No Is there foot or ankle muscle weakness?: No Normal Range of Motion: Yes Right posterior Tibialias: Diminished Left posterior Tibialias: Diminished   Right Dorsalis Pedis: Diminished Left Dorsalis Pedis: Diminished  Semmes-Weinstein Monofilament Test R Site 1-Great Toe: Neg L Site 1-Great Toe: Neg            BP 118/72   Pulse 80   Ht _0  (1.803 m)   Wt 287 lb (130.2 kg)   SpO2 96%   BMI 40.03 kg/m  Wt Readings from Last 3 Encounters:  12/21/22 287 lb (130.2 kg)  10/17/22 286 lb 4 oz (129.8 kg)  09/20/22 286 lb 4 oz (129.8 kg)     Health Maintenance Due  Topic Date Due   FOOT EXAM  Never done   Zoster Vaccines- Shingrix (1 of 2) Never done   COLONOSCOPY (Pts 45-71yr Insurance coverage will need to be confirmed)  02/13/2021   COVID-19 Vaccine (4 - 2023-24 season) 08/18/2022    There are no preventive care reminders to display for this patient.  Lab Results  Component  Value Date   TSH 2.230 05/05/2022   Lab Results  Component Value Date   WBC 7.2 09/13/2022   HGB 13.6 09/13/2022   HCT 42.2 09/13/2022   MCV 86.8 09/13/2022   PLT 227.0 09/13/2022   Lab Results  Component Value Date   NA 140 12/21/2022   K 4.4 12/21/2022   CO2 34 (H) 12/21/2022   GLUCOSE 156 (H) 12/21/2022   BUN 21 12/21/2022   CREATININE 1.20 12/21/2022   BILITOT 0.4 12/21/2022   ALKPHOS 84 12/21/2022   AST 16 12/21/2022   ALT 14 12/21/2022   PROT 6.9 12/21/2022   ALBUMIN 3.8 12/21/2022   CALCIUM 9.5  12/21/2022   ANIONGAP 8 05/30/2022   EGFR 58 (L) 05/05/2022   GFR 60.52 12/21/2022   Lab Results  Component Value Date   CHOL 146 09/13/2022   Lab Results  Component Value Date   HDL 49.20 09/13/2022   Lab Results  Component Value Date   LDLCALC 76 09/13/2022   Lab Results  Component Value Date   TRIG 106.0 09/13/2022   Lab Results  Component Value Date   CHOLHDL 3 09/13/2022   Lab Results  Component Value Date   HGBA1C 7.5 (H) 12/21/2022      Assessment & Plan:   Problem List Items Addressed This Visit       Cardiovascular and Mediastinum   Hypertension associated with diabetes ([place])    Urine microalbumin ordered. Stable.  Continue medications as prescribed.      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 Abn findings with monofilament, referral placed for vascular to evaluate for pad/venous insufficiency   Reminded pt need for yearly eye exam.  Pt to f/u with neurology for ongoing neurological deficits which seem to be worsening.  Advised pt to start jardiance 10 mg once daily, continue trulicity 75 mg weekly. Did d/w pt on compliance as has been missing dosing. A1c ordered.      Relevant Medications   empagliflozin (JARDIANCE) 10 MG TABS tablet   Other Relevant Orders   Hemoglobin A1c (Completed)   Microalbumin / creatinine urine ratio (Completed)     Nervous and Auditory   MCI (mild cognitive impairment) with memory loss     Musculoskeletal and Integument   Onychomycosis    Worsening, left great toe.  Rx penlac solution 8%      Relevant Medications   ciclopirox (PENLAC) 8 % solution     Other   Family history of colon cancer in mother   Relevant Orders   Ambulatory referral to Gastroenterology   Decreased pulses in feet   Relevant Orders   Ambulatory referral to Vascular Surgery   Pedal edema   Relevant  Orders   Ambulatory referral to Vascular Surgery   RESOLVED: Screening for colon cancer   Relevant Orders   Ambulatory referral to Gastroenterology   Other Visit Diagnoses     Decreased GFR    -  Primary   Relevant Orders   Hemoglobin A1c (Completed)   Comprehensive metabolic panel (Completed)       Meds ordered this encounter  Medications   ciclopirox (PENLAC) 8 % solution    Sig: Apply topically at bedtime. Apply over nail and surrounding skin. Apply daily over previous coat. After seven (7) days, may remove with alcohol and continue cycle.    Dispense:  6.6  mL    Refill:  0    Order Specific Question:   Supervising Provider    Answer:   BEDSOLE, [name] E [[id number]]   empagliflozin (JARDIANCE) 10 MG TABS tablet    Sig: Take 1 tablet (10 mg total) by mouth daily before breakfast.    Dispense:  90 tablet    Refill:  1    Order Specific Question:   Supervising Provider    Answer:   Diona Browner, [name] E [[id number]]    Follow-up: Return in about 3 months (around 03/22/2023) for f/u diabetes.    Eugenia Pancoast, FNP

## 2022-12-21 NOTE — Patient Instructions (Addendum)
Stop by the lab prior to leaving today. I will notify you of your results once received.   A referral was placed today for Gi to set up your colonoscopy.  Please let us know if you have not heard back within 1 week about your referral.  Recommend shingles vaccine as well as covid vaccination.   Continue weekly trulicity injections at 0.75 mg as well as start jardiance 10 mg once daily.   Regards,   Eugenia Pancoast FNP-C

## 2022-12-22 ENCOUNTER — Telehealth: Payer: Self-pay

## 2022-12-22 ENCOUNTER — Other Ambulatory Visit: Payer: Self-pay

## 2022-12-22 DIAGNOSIS — Z1211 Encounter for screening for malignant neoplasm of colon: Secondary | ICD-10-CM

## 2022-12-22 DIAGNOSIS — Z800 Family history of malignant neoplasm of digestive organs: Secondary | ICD-10-CM

## 2022-12-22 MED ORDER — NA SULFATE-K SULFATE-MG SULF 17.5-3.13-1.6 GM/177ML PO SOLN: 1.0000 | Freq: Once | ORAL | 0 refills | Status: AC

## 2022-12-22 NOTE — Telephone Encounter (Signed)
Gastroenterology Pre-Procedure Review  Request Date: 01/23/23 Requesting Physician: Dr. Vicente Males  PATIENT REVIEW QUESTIONS: The patient responded to the following health history questions as indicated:    1. Are you having any GI issues?  Colitis in the past 2. Do you have a personal history of Polyps? no 3. Do you have a family history of Colon Cancer or Polyps? yes (mother colon cancer) 4. Diabetes Mellitus? yes (Stop Trucilicity 54/09/81. Stop Jardiance and Metformin 02/03) 5. Joint replacements in the past 12 months?no 6. Major health problems in the past 3 months?no 7. Any artificial heart valves, MVP, or defibrillator?no    MEDICATIONS & ALLERGIES:    Patient reports the following regarding taking any anticoagulation/antiplatelet therapy:   Plavix, Coumadin, Eliquis, Xarelto, Lovenox, Pradaxa, Brilinta, or Effient? no Aspirin? no  Patient confirms/reports the following medications:  Current Outpatient Medications  Medication Sig Dispense Refill   aspirin EC 81 MG tablet Take 1 tablet (81 mg total) by mouth daily. Swallow whole. 90 tablet 1   buPROPion (WELLBUTRIN SR) 150 MG 12 hr tablet Take 150 mg by mouth daily.     ciclopirox (PENLAC) 8 % solution Apply topically at bedtime. Apply over nail and surrounding skin. Apply daily over previous coat. After seven (7) days, may remove with alcohol and continue cycle. 6.6 mL 0   clonazePAM (KLONOPIN) 0.5 MG tablet Take 0.5 mg by mouth 3 (three) times daily as needed for anxiety.     Coenzyme Q10 (COQ-10) 100 MG CAPS Take 100 mg by mouth daily.     Cyanocobalamin (VITAMIN B-12 PO) Take 1,000 mcg by mouth daily at 6 (six) AM.     Dulaglutide (TRULICITY) 1.91 YN/8.2NF SOPN Inject 0.75 mg into the skin once a week. 2 mL 3   empagliflozin (JARDIANCE) 10 MG TABS tablet Take 1 tablet (10 mg total) by mouth daily before breakfast. 90 tablet 1   gabapentin (NEURONTIN) 300 MG capsule Take one tablet qam, take one tablet mid day, and then take two  tablets qhs 120 capsule 2   glucose blood (CONTOUR NEXT TEST) test strip Use as instructed to check blood sugar daily 100 each 4   metFORMIN (GLUCOPHAGE-XR) 500 MG 24 hr tablet TAKE 2 TABLETS BY MOUTH TWICE A DAY 360 tablet 4   mirtazapine (REMERON) 30 MG tablet Take 30 mg by mouth at bedtime.     pravastatin (PRAVACHOL) 40 MG tablet TAKE 1 TABLET (40 MG TOTAL) BY MOUTH DAILY WITH LUNCH. 90 tablet 3   valsartan-hydrochlorothiazide (DIOVAN-HCT) 80-12.5 MG tablet TAKE 1 TABLET BY MOUTH EVERY DAY 90 tablet 1   venlafaxine XR (EFFEXOR-XR) 150 MG 24 hr capsule Take 150 mg by mouth daily with breakfast.     Vitamin D, Cholecalciferol, 1000 units TABS Take 2,000 Units by mouth daily.     No current facility-administered medications for this visit.    Patient confirms/reports the following allergies:  No Known Allergies  No orders of the defined types were placed in this encounter.   AUTHORIZATION INFORMATION Primary Insurance: 1D#: Group #:  Secondary Insurance: 1D#: Group #:  SCHEDULE INFORMATION: Date: 01/23/23 Time: Location: [facility]

## 2022-12-22 NOTE — Telephone Encounter (Signed)
Requested medication (s) are due for refill today: routing for review  Requested medication (s) are on the active medication list:yes  Last refill:  unknown  Future visit scheduled: no  Notes to clinic:  Unable to refill per protocol, another provider listed as PCP.  Routing for review     Requested Prescriptions  Pending Prescriptions Disp Refills   TRULICITY 4.5 ML/4.6TK SOPN [Pharmacy Med Name: TRULICITY 4.5 PT/4.6 ML PEN]  25    Sig: INJECT 4.5 MG AS DIRECTED ONCE A WEEK.     Endocrinology:  Diabetes - GLP-1 Receptor Agonists Failed - 12/21/2022 11:13 PM      Failed - Valid encounter within last 6 months    Recent Outpatient Visits           6 months ago Cough, unspecified type   [facility] Birdie Sons, MD   7 months ago Gobles, Donald E, MD   7 months ago Acute cough   [facility] Tally Joe T, FNP   8 months ago Primary hypertension   [employer], Ashley, PA-C   1 year ago Type 2 diabetes mellitus with other neurologic complication, without long-term current use of insulin [facility])   [facility] Birdie Sons, MD       Future Appointments             In 3 months Dugal, Lawerance Bach, [place] [facility] [place], [place] is between 0 and 7.9 and within 180 days    Hemoglobin A1C  Date Value Ref Range Status  08/30/2014 6.0 4.2 - 6.3 % Final    Comment:    The American Diabetes Association recommends that a primary goal of therapy should be <7% and that physicians should reevaluate the treatment regimen in patients with HbA1c values consistently >8%.    Hgb A1c MFr Bld  Date Value Ref Range Status  12/21/2022 7.5 (H) 4.6 - 6.5 % Final    Comment:    Glycemic Control Guidelines for People with Diabetes:Non Diabetic:  <6%Goal of Therapy: <7%Additional Action Suggested:  >8%

## 2022-12-24 ENCOUNTER — Encounter: Payer: Self-pay | Admitting: Family

## 2022-12-25 ENCOUNTER — Ambulatory Visit: Payer: PPO

## 2022-12-25 NOTE — Assessment & Plan Note (Signed)
Urine microalbumin ordered. Stable.  Continue medications as prescribed.

## 2022-12-25 NOTE — Assessment & Plan Note (Signed)
Completed complete foot exam. Abn findings with monofilament, referral placed for vascular to evaluate for pad/venous insufficiency   Reminded pt need for yearly eye exam.  Pt to f/u with neurology for ongoing neurological deficits which seem to be worsening.  Advised pt to start jardiance 10 mg once daily, continue trulicity 75 mg weekly. Did d/w pt on compliance as has been missing dosing. A1c ordered.

## 2022-12-25 NOTE — Assessment & Plan Note (Signed)
Worsening, left great toe.  Rx penlac solution 8%

## 2022-12-26 ENCOUNTER — Ambulatory Visit: Payer: PPO | Attending: Neurology | Admitting: Speech Pathology

## 2022-12-26 DIAGNOSIS — R278 Other lack of coordination: Secondary | ICD-10-CM | POA: Insufficient documentation

## 2022-12-26 DIAGNOSIS — R269 Unspecified abnormalities of gait and mobility: Secondary | ICD-10-CM | POA: Insufficient documentation

## 2022-12-26 DIAGNOSIS — R4701 Aphasia: Secondary | ICD-10-CM | POA: Diagnosis not present

## 2022-12-26 DIAGNOSIS — R262 Difficulty in walking, not elsewhere classified: Secondary | ICD-10-CM | POA: Insufficient documentation

## 2022-12-26 DIAGNOSIS — R2689 Other abnormalities of gait and mobility: Secondary | ICD-10-CM | POA: Insufficient documentation

## 2022-12-26 DIAGNOSIS — R296 Repeated falls: Secondary | ICD-10-CM | POA: Diagnosis not present

## 2022-12-26 DIAGNOSIS — R471 Dysarthria and anarthria: Secondary | ICD-10-CM | POA: Insufficient documentation

## 2022-12-26 DIAGNOSIS — R2681 Unsteadiness on feet: Secondary | ICD-10-CM | POA: Diagnosis not present

## 2022-12-26 DIAGNOSIS — R41841 Cognitive communication deficit: Secondary | ICD-10-CM

## 2022-12-26 DIAGNOSIS — M6281 Muscle weakness (generalized): Secondary | ICD-10-CM | POA: Diagnosis not present

## 2022-12-26 NOTE — Therapy (Unsigned)
OUTPATIENT SPEECH LANGUAGE PATHOLOGY  COGNITION EVALUATION   Patient Name: Edward King MRN: [id number] DOB:1951/09/02, 72 y.o., male Today's Date: 12/26/2022  PCP: Edward Pancoast, FNP REFERRING PROVIDER: Jennings Books, MD   End of Session - 12/26/22 1536     Visit Number 1    Number of Visits 25    Date for SLP Re-Evaluation 03/26/23    SLP Start Time 99    SLP Stop Time  1215    SLP Time Calculation (min) 75 min    Activity Tolerance Patient tolerated treatment well             Past Medical History:  Diagnosis Date   Arthritis    Cat bite 07/31/2018   Cellulitis of right leg without foot    Colitis    Depression    Diabetes mellitus without complication ([place])    Giant cell arteritis ([place]) 06/15/2015   History of chicken pox    History of measles    History of mumps    Hypertension    Lymph edema    both legs.  mostly left   Neuromuscular disorder ([place])    Prostate CA ([place]) 2016   Radiation seed implants.   Stroke [facility])    Temporal arteritis ([place])    Clinically dx Dr. Jefm King. Negative temporal artrtery bx   Wears hearing aid in both ears    Past Surgical History:  Procedure Laterality Date   ABDOMINOPLASTY     Carotid Doppler Ultrasound  08/22/2014   [facility]; Minimal plaque right. Minimal thisckening left Anterograde flow vertebrals   CATARACT EXTRACTION W/PHACO Right 07/03/2022   Procedure: CATARACT EXTRACTION PHACO AND INTRAOCULAR LENS PLACEMENT (IOC) RIGHT;  Surgeon: Edward Bear, MD;  Location: [place];  Service: Ophthalmology;  Laterality: Right;  Diabetic 2.36 00:18.8   CATARACT EXTRACTION W/PHACO Left 07/17/2022   Procedure: CATARACT EXTRACTION PHACO AND INTRAOCULAR LENS PLACEMENT ([place]) LEFT DIABETIC;  Surgeon: Edward Bear, MD;  Location: [place];  Service: Ophthalmology;  Laterality: Left;  Diabetic 3.06 00:23.1   COLONOSCOPY WITH PROPOFOL N/A 02/13/2018   Procedure: COLONOSCOPY WITH PROPOFOL;  Surgeon:  Edward Silvas, MD;  Location: [facility] ENDOSCOPY;  Service: Endoscopy;  Laterality: N/A;   CT of the head  08/22/2014   [facility]. Normal   DOPPLER ECHOCARDIOGRAPHY  08/29/2014   EF=60-65%. Normal LVEF   Myocardial Perfusion Scan  09/10/2012   Poor exercsie tolerance, but no evidence of stress induced myocardial ischemia. EF=66%. Dr. Clayborn King   NASAL SEPTOPLASTY W/ TURBINOPLASTY Bilateral 02/10/2016   Procedure: NASAL SEPTOPLASTY WITH INFERIOR TURBINATE REDUCTION;  Surgeon: Edward Manner, MD;  Location: [facility] ORS;  Service: ENT;  Laterality: Bilateral;   pannectomy     TEMPORAL ARTERY BIOPSY / LIGATION Left    Patient Active Problem List   Diagnosis Date Noted   MCI (mild cognitive impairment) with memory loss 12/21/2022   Family history of colon cancer in mother 12/21/2022   Decreased pulses in feet 12/21/2022   Pedal edema 12/21/2022   Onychomycosis 12/21/2022   History of arterial ischemic stroke 09/20/2022   Mixed hyperlipidemia 09/13/2022   Crohn's disease of colon without complication ([place]) 00/17/4944   Hypertension associated with diabetes ([place]) 05/05/2022   Osteoarthritis of knees, bilateral 09/26/2021   Lumbar radiculopathy 12/24/2020   Coronary atherosclerosis 01/24/2019   Morbid obesity ([place]) 08/27/2018   Chronic colitis 03/15/2018   Hypersomnia 05/11/2017   Fatigue 05/11/2017   Multiple lung nodules on CT 02/29/2016   Hypogonadism in male  06/16/2015   History of prostate cancer 06/16/2015   OSA (obstructive sleep apnea) 06/16/2015   Malignant neoplasm of prostate ([place]) 06/16/2015   Diabetes mellitus with neurological manifestation ([place]) 05/06/2014   Type 2 diabetes mellitus without complications ([place]) 95/08/3266   Edema 02/28/2013    ONSET DATE: 11/15/2022 (referral date)   REFERRING DIAG: Mild Cognitive Impairment  THERAPY DIAG:  Cognitive communication deficit  Dysarthria and anarthria  Rationale for Evaluation and Treatment Habilitation  SUBJECTIVE:    SUBJECTIVE STATEMENT: "I don't know why I'm here." Pt accompanied by: self  PERTINENT HISTORY: Edward King is a 72 y.o. male with past medical history incluidng CVA, HTN, DM II, OSA, Crohn's disease, mild cognitive impairment, and prostate cancer, referred by Dr. Jennings King due to memory loss. Pt scored 20/30 on SLUMS on 08/15/2022. 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 neurodegenerative disease with moderate white matter microvascular ischemic and metabolic changes and lacunar stroke following decreased visual acuity prior to cataracts in patient with word finding difficulty, difficulty remembering conversations, confusion while driving (notes getting lost), concern for some agitation and argumentative communications, positive family history of Frontotemporal Dementia (negative gene testing and elevated serum Neurofilament Light chain protein)."  DIAGNOSTIC FINDINGS: 09/04/2022 MRI without contrast with NeuroQuant IMPRESSION: Probable small subacute infarct within the right frontal white matter. Mild chronic microvascular ischemic disease. NeuroQuant volumetric analysis of the brain, see details on [employer].    PAIN:  Are you having pain? No   FALLS: Has patient fallen in last 6 months?  No  LIVING ENVIRONMENT: Lives with: lives with their family Lives in: House/apartment  PLOF:  Level of assistance: Independent with ADLs, Independent with IADLs Employment: Building control surveyor, Retired   PATIENT GOALS lose things less often  OBJECTIVE:   COGNITIVE COMMUNICATION Overall cognitive status: Impaired Areas of impairment:  Attention: Impaired: Alternating, Comment: does have some visual deficits Awareness: Impaired: Intellectual and Emergent Executive function: Impaired: Organization, Planning, and Slow processing Auditory comprehension: WFL Verbal expression: WFL Functional communication: WFL Functional deficits: per MD notes: getting lost when driving/missing exits,    ORAL MOTOR EXAMINATION Facial : WFL Lingual: WFL Velum: WFL Mandible: WFL Cough: WFL Voice: WFL   STANDARDIZED ASSESSMENTS:  Cognitive Linguistic Quick Test  AGE - 70-89   The Cognitive Linguistic Quick Test (CLQT) was administered to assess the relative status of five cognitive domains: attention, memory, language, executive functioning, and visuospatial skills. Scores from 10 tasks were used to estimate severity ratings (standardized for age groups 18-69 years and 70-89 years) for each domain, a clock drawing task, as well as an overall composite severity rating of cognition.       Task Score Criterion Cut Scores  Personal Facts 8/8 8  Symbol Cancellation 7/12 10  Confrontation Naming 10/10 10  Clock Drawing  11/13 11  Story Retelling 8/10 5  Symbol Trails 5/10 6  Generative Naming 4/9 4  Design Memory 4/6 4  [place]  4/8 4  Design Generation 4/13 5    Cognitive Domain Composite Score Severity Rating  Attention 124/215 Mild  Memory 152/185 WNL  Executive Function 17/40 Mild  Language 30/37 WNL  Visuospatial Skills 56/105 Mild  Clock Drawing  11/13 WNL  Composite Severity Rating 3.4 Mild     PATIENT REPORTED OUTCOME MEASURES (PROM):  MULTIFACTORIAL MEMORY QUESTIONNAIRE (MMQ)  Administered patient self-reported outcome measure Multifactorial Memory Questionnaire (MMQ). The Multifactorial Memory Questionnaire [facility]) consists of three scales measuring separate aspects of metamemory; Satisfaction, Ability and Strategy.   Pt's  responses are converted to T-Scores with severity levels based on pt's T-Score.   Severity Levels (T-score) Very Low - < 20 Low - 20 to 29 Below Average - 30-39 Average - 40 to 60 Above Average - 60 to 70 High - 71 to 80 Very High - > 80  Pt reports: Often: forgetting what he is about to do, misplacing items. Sometimes: forgetting to pay a bill, forgetting name of someone he just met, difficulty coming up with specific word in conversation,  remembering details from newspaper or magazine article read earlier that day.   Average - 40 to 60 Memory Ability (T-score: 56)    PATIENT EDUCATION: Education details: Explained deficit areas and course of ST targeting compensatory strategies/education Person educated: Patient Education method: Explanation Education comprehension: verbalized understanding and needs further education     GOALS: Goals reviewed with patient? Yes  SHORT TERM GOALS: Target date: 10 sessions  *** Baseline: Goal status: {GOALSTATUS:25110}  2.  *** Baseline:  Goal status: {GOALSTATUS:25110}  3.  *** Baseline:  Goal status: {GOALSTATUS:25110}   Goal status: {GOALSTATUS:25110}  LONG TERM GOALS: Target date: 03/26/2023 Pt will demonstrate knowledge of community resources and activities to support language and cognitive function over time.  Baseline:  Goal status: INITIAL  2.  *** Baseline:  Goal status: {GOALSTATUS:25110}  3.  *** Baseline:  Goal status: {GOALSTATUS:25110}    ASSESSMENT:  CLINICAL IMPRESSION: Patient is a *** y.o. *** who was seen today for ***.   OBJECTIVE IMPAIRMENTS include attention, memory, awareness, and executive functioning. These impairments are limiting patient from household responsibilities and effectively communicating at home and in community. Factors affecting potential to achieve goals and functional outcome are ability to learn/carryover information and family/community support.. Patient will benefit from skilled SLP services to address above impairments and improve overall function.  REHAB POTENTIAL: Good  PLAN: SLP FREQUENCY: 1-2x/week  SLP DURATION: 12 weeks  PLANNED INTERVENTIONS: Cueing hierachy, Internal/external aids, Functional tasks, SLP instruction and feedback, Compensatory strategies, Patient/family education, and Re-evaluation  Deneise Lever, MS, Actor [phone]

## 2022-12-27 ENCOUNTER — Ambulatory Visit: Payer: PPO

## 2022-12-27 NOTE — Progress Notes (Signed)
[employer] did not view their my chart test result that I responded to one week ago.  Please call them and advise them of the below results.

## 2022-12-28 ENCOUNTER — Telehealth: Payer: Self-pay | Admitting: Family

## 2022-12-28 NOTE — Telephone Encounter (Signed)
Patient wife came in office and asked can patient  get the nail polish for fugus on toenail. Call back number [phone].

## 2023-01-01 ENCOUNTER — Ambulatory Visit: Payer: PPO | Admitting: Speech Pathology

## 2023-01-01 ENCOUNTER — Ambulatory Visit: Payer: PPO

## 2023-01-01 DIAGNOSIS — R278 Other lack of coordination: Secondary | ICD-10-CM

## 2023-01-01 DIAGNOSIS — R262 Difficulty in walking, not elsewhere classified: Secondary | ICD-10-CM

## 2023-01-01 DIAGNOSIS — M6281 Muscle weakness (generalized): Secondary | ICD-10-CM

## 2023-01-01 DIAGNOSIS — R2689 Other abnormalities of gait and mobility: Secondary | ICD-10-CM

## 2023-01-01 DIAGNOSIS — R269 Unspecified abnormalities of gait and mobility: Secondary | ICD-10-CM

## 2023-01-01 DIAGNOSIS — R296 Repeated falls: Secondary | ICD-10-CM

## 2023-01-01 DIAGNOSIS — R2681 Unsteadiness on feet: Secondary | ICD-10-CM

## 2023-01-01 DIAGNOSIS — R41841 Cognitive communication deficit: Secondary | ICD-10-CM | POA: Diagnosis not present

## 2023-01-01 DIAGNOSIS — R4701 Aphasia: Secondary | ICD-10-CM

## 2023-01-01 NOTE — Therapy (Signed)
OUTPATIENT SPEECH LANGUAGE PATHOLOGY TREATMENT   Patient Name: Edward King MRN: [id number] DOB:Feb 05, 1951, 72 y.o., male Today's Date: 01/01/2023  PCP: Edward Pancoast, FNP REFERRING PROVIDER: Jennings Books, MD   End of Session - 01/01/23 0959     Visit Number 2    Number of Visits 25    Date for SLP Re-Evaluation 03/26/23    SLP Start Time 0958    SLP Stop Time  1100    SLP Time Calculation (min) 62 min    Activity Tolerance Patient tolerated treatment well             Past Medical History:  Diagnosis Date   Arthritis    Cat bite 07/31/2018   Cellulitis of right leg without foot    Colitis    Depression    Diabetes mellitus without complication ([place])    Giant cell arteritis ([place]) 06/15/2015   History of chicken pox    History of measles    History of mumps    Hypertension    Lymph edema    both legs.  mostly left   Neuromuscular disorder ([place])    Prostate CA ([place]) 2016   Radiation seed implants.   Stroke [facility])    Temporal arteritis ([place])    Clinically dx Dr. Jefm King. Negative temporal artrtery bx   Wears hearing aid in both ears    Past Surgical History:  Procedure Laterality Date   ABDOMINOPLASTY     Carotid Doppler Ultrasound  08/22/2014   [facility]; Minimal plaque right. Minimal thisckening left Anterograde flow vertebrals   CATARACT EXTRACTION W/PHACO Right 07/03/2022   Procedure: CATARACT EXTRACTION PHACO AND INTRAOCULAR LENS PLACEMENT (IOC) RIGHT;  Surgeon: Edward Bear, MD;  Location: [place];  Service: Ophthalmology;  Laterality: Right;  Diabetic 2.36 00:18.8   CATARACT EXTRACTION W/PHACO Left 07/17/2022   Procedure: CATARACT EXTRACTION PHACO AND INTRAOCULAR LENS PLACEMENT ([place]) LEFT DIABETIC;  Surgeon: Edward Bear, MD;  Location: [place];  Service: Ophthalmology;  Laterality: Left;  Diabetic 3.06 00:23.1   COLONOSCOPY WITH PROPOFOL N/A 02/13/2018   Procedure: COLONOSCOPY WITH PROPOFOL;  Surgeon: Edward Silvas,  MD;  Location: [facility] ENDOSCOPY;  Service: Endoscopy;  Laterality: N/A;   CT of the head  08/22/2014   [facility]. Normal   DOPPLER ECHOCARDIOGRAPHY  08/29/2014   EF=60-65%. Normal LVEF   Myocardial Perfusion Scan  09/10/2012   Poor exercsie tolerance, but no evidence of stress induced myocardial ischemia. EF=66%. Dr. Clayborn King   NASAL SEPTOPLASTY W/ TURBINOPLASTY Bilateral 02/10/2016   Procedure: NASAL SEPTOPLASTY WITH INFERIOR TURBINATE REDUCTION;  Surgeon: Edward Manner, MD;  Location: [facility] ORS;  Service: ENT;  Laterality: Bilateral;   pannectomy     TEMPORAL ARTERY BIOPSY / LIGATION Left    Patient Active Problem List   Diagnosis Date Noted   MCI (mild cognitive impairment) with memory loss 12/21/2022   Family history of colon cancer in mother 12/21/2022   Decreased pulses in feet 12/21/2022   Pedal edema 12/21/2022   Onychomycosis 12/21/2022   History of arterial ischemic stroke 09/20/2022   Mixed hyperlipidemia 09/13/2022   Crohn's disease of colon without complication ([place]) 51/01/5851   Hypertension associated with diabetes ([place]) 05/05/2022   Osteoarthritis of knees, bilateral 09/26/2021   Lumbar radiculopathy 12/24/2020   Coronary atherosclerosis 01/24/2019   Morbid obesity ([place]) 08/27/2018   Chronic colitis 03/15/2018   Hypersomnia 05/11/2017   Fatigue 05/11/2017   Multiple lung nodules on CT 02/29/2016   Hypogonadism in male 06/16/2015  History of prostate cancer 06/16/2015   OSA (obstructive sleep apnea) 06/16/2015   Malignant neoplasm of prostate ([place]) 06/16/2015   Diabetes mellitus with neurological manifestation ([place]) 05/06/2014   Type 2 diabetes mellitus without complications ([place]) 08/67/6195   Edema 02/28/2013    ONSET DATE: 11/15/2022 (referral date)   REFERRING DIAG: Mild Cognitive Impairment  THERAPY DIAG:  Cognitive communication deficit  Aphasia  Rationale for Evaluation and Treatment Habilitation  SUBJECTIVE:   SUBJECTIVE STATEMENT:  Pt asked if  his wife should come with him; encouraged spouse to attend if possible. Pt accompanied by: self  PERTINENT HISTORY: Edward King is a 72 y.o. male with past medical history incluidng CVA, HTN, DM II, OSA, Crohn's disease, mild cognitive impairment, and prostate cancer, referred by Dr. Jennings King due to memory loss. Pt scored 20/30 on SLUMS on 08/15/2022. 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 neurodegenerative disease with moderate white matter microvascular ischemic and metabolic changes and lacunar stroke following decreased visual acuity prior to cataracts in patient with word finding difficulty, difficulty remembering conversations, confusion while driving (notes getting lost), concern for some agitation and argumentative communications, positive family history of Frontotemporal Dementia (negative gene testing and elevated serum Neurofilament Light chain protein)."  DIAGNOSTIC FINDINGS: 09/04/2022 MRI without contrast with NeuroQuant IMPRESSION: Probable small subacute infarct within the right frontal white matter. Mild chronic microvascular ischemic disease. NeuroQuant volumetric analysis of the brain, see details on [employer].    PAIN:  Are you having pain? No   FALLS: Has patient fallen in last 6 months?  No  LIVING ENVIRONMENT: Lives with: lives with their family Lives in: House/apartment  PLOF:  Level of assistance: Independent with ADLs, Independent with IADLs Employment: Building control surveyor, Retired   PATIENT GOALS lose things less often  OBJECTIVE:   TODAY'S TREATMENT: Completed reading and writing assessment (Western Aphasia Battery part 2), and naming assessment [employer]). See below for details.  [place] Naming Test  The [employer] was administered. Pt scored 53/60. This is within norm of 48.9 +/- 6.3 for age range of 109-79.  (Norms for adults: 18-39 55.8, SD 3.8, 40-49 56.8, SD 3.0, 50-59 55.2, SD 4.0, 60-69 53.3, SD 4.6, 70-79 48.9,  SD 6.3)  Number of spontaneously given correct responses: 53 Number of stimulus cues given: 7 Number of correct responses following a stimulus cue: 0 Number of phonemic cues: 7 Number of correct responses following a phonemic cue: 6 Number of multiple choices given: 0 Number of correct choices: 0  [place]hasias Phonological: 1 (asparagar/ aparagus) Verbal: 1 (thongs/tongs) Neologistic: 0 Multi-word: 0 Perceptual:0   WAB-R Part II   Reading:  A. Comprehension of Sentences                    40/40  *noted paraphasias frequently when reading aloud  (run/return, pairs/parts, can't/can, peaceful/plentiful, thoughtful/though, shrank/sank, such/sunk) B. Reading Commands                                  20/20 Combined score 60/60, therefore did not test word level   Writing:            A. Writing upon request                                  6/6  B. Writing output                                             7/34            C. Writing to dictation                                      8/10            D. Writing dictated words                                6.5/10            E. Alphabet and numbers                               20/22.5            F. Dictated Letters and numbers                    7.5/7.5             G. Copying a sentence                                   8/10            Writing Total:                                                  63/100     PATIENT EDUCATION: Education details: accessibility features on phone to assist with reading (text-to-speech, Speak Selection) Person educated: Patient Education method: Explanation Education comprehension: verbalized understanding and needs further education     GOALS: Goals reviewed with patient? Yes  SHORT TERM GOALS: Target date: 10 sessions  Patient will participate in standardized assessment of language function (naming, reading and writing). Baseline: Goal status: INITIAL  2.  Patient will implement  system for organization to reduce frustration over losing items. Baseline:  Goal status: INITIAL  3.  Patient will use memory compensation strategies to recall 75% of details from therapy session. Baseline:  Goal status: INITIAL   Goal status: INITIAL  LONG TERM GOALS: Target date: 03/26/2023 Pt will demonstrate knowledge of community resources and activities to support language and cognitive function over time.  Baseline:  Goal status: INITIAL  2.  Patient will report reduced frequency of losing items, being overwhelmed with tasks with use of organization system.  Baseline:  Goal status: INITIAL  3.  Patient will report using memory strategies to recall details and follow-up information for appointment aside from [place] (such as neurologist).  Baseline:  Goal status: INITIAL    ASSESSMENT:  CLINICAL IMPRESSION: Patient is a 72 y.o. male who presents with cognitive communication and language impairments (reading/writing). While he does have visual impairments which may contribute to reduced performance in visuospatial tasks, functional attention and working memory deficits noted during testing in non-visual tasks. Slow processing is also noted.  Reading and writing reveal paraphasias in both written and reading tasks. Attention also impacts writing abilities (patient had difficulty maintaining train of thought when transitioning to a new line). Patient has thus far attended sessions alone; encouraged him to bring spouse if she is able to join. I recommend course of ST with focus on awareness, education, and using strategies and aids to increase safety and improve management of daily household functions.   OBJECTIVE IMPAIRMENTS include attention, memory, awareness, and executive functioning. These impairments are limiting patient from household responsibilities and effectively communicating at home and in community. Factors affecting potential to achieve goals and functional outcome are  ability to learn/carryover information and family/community support. Patient will benefit from skilled SLP services to address above impairments and improve overall function.  REHAB POTENTIAL: Good  PLAN: SLP FREQUENCY: 1-2x/week  SLP DURATION: 12 weeks  PLANNED INTERVENTIONS: Cueing hierachy, Internal/external aids, Functional tasks, SLP instruction and feedback, Compensatory strategies, Patient/family education, and Re-evaluation  Deneise Lever, MS, Actor [phone]

## 2023-01-01 NOTE — Therapy (Signed)
OUTPATIENT PHYSICAL THERAPY NEURO EVALUATION   Patient Name: Edward King MRN: [id number] DOB:1951/08/02, 72 y.o., male Today's Date: 01/02/2023   PCP: Eugenia Pancoast, FNP REFERRING PROVIDER: Dr. [place] Books  END OF SESSION:  PT End of Session - 01/02/23 1322     Visit Number 1    Number of Visits 24    Date for PT Re-Evaluation 03/26/23    Progress Note Due on Visit 10    PT Start Time 1100    PT Stop Time 1146    PT Time Calculation (min) 46 min    Equipment Utilized During Treatment Gait belt    Activity Tolerance Patient tolerated treatment well    Behavior During Therapy WFL for tasks assessed/performed             Past Medical History:  Diagnosis Date   Arthritis    Cat bite 07/31/2018   Cellulitis of right leg without foot    Colitis    Depression    Diabetes mellitus without complication ([place])    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  mostly left   Neuromuscular disorder ([place])    Prostate CA ([place]) 2016   Radiation seed implants.   Stroke [facility])    Temporal arteritis ([place])    Clinically dx Dr. Jefm Bryant. Negative temporal artrtery bx   Wears hearing aid in both ears    Past Surgical History:  Procedure Laterality Date   ABDOMINOPLASTY     Carotid Doppler Ultrasound  08/22/2014   [facility]; Minimal plaque right. Minimal thisckening left Anterograde flow vertebrals   CATARACT EXTRACTION W/PHACO Right 07/03/2022   Procedure: CATARACT EXTRACTION PHACO AND INTRAOCULAR LENS PLACEMENT (IOC) RIGHT;  Surgeon: Eulogio Bear, MD;  Location: [place];  Service: Ophthalmology;  Laterality: Right;  Diabetic 2.36 00:18.8   CATARACT EXTRACTION W/PHACO Left 07/17/2022   Procedure: CATARACT EXTRACTION PHACO AND INTRAOCULAR LENS PLACEMENT ([place]) LEFT DIABETIC;  Surgeon: Eulogio Bear, MD;  Location: [place];  Service:  Ophthalmology;  Laterality: Left;  Diabetic 3.06 00:23.1   COLONOSCOPY WITH PROPOFOL N/A 02/13/2018   Procedure: COLONOSCOPY WITH PROPOFOL;  Surgeon: Manya Silvas, MD;  Location[facility] ENDOSCOPY;  Service: Endoscopy;  Laterality: N/A;   CT of the head  08/22/2014   [facility]. Normal   DOPPLER ECHOCARDIOGRAPHY  08/29/2014   EF=60-65%. Normal LVEF   Myocardial Perfusion Scan  09/10/2012   Poor exercsie tolerance, but no evidence of stress induced myocardial ischemia. EF=66%. Dr. Clayborn Bigness   NASAL SEPTOPLASTY W/ TURBINOPLASTY Bilateral 02/10/2016   Procedure: NASAL SEPTOPLASTY WITH INFERIOR TURBINATE REDUCTION;  Surgeon: Carloyn Manner, MD;  Location: [facility] ORS;  Service: ENT;  Laterality: Bilateral;   pannectomy     TEMPORAL ARTERY BIOPSY / LIGATION Left    Patient Active Problem List   Diagnosis Date Noted   MCI (mild cognitive impairment) with memory loss 12/21/2022   Family history of colon cancer in mother 12/21/2022   Decreased pulses in feet 12/21/2022   Pedal edema 12/21/2022   Onychomycosis 12/21/2022   History of arterial ischemic stroke 09/20/2022   Mixed hyperlipidemia 09/13/2022   Crohn's disease of colon without complication ([place]) 29/47/6546   Hypertension associated with diabetes ([place]) 05/05/2022   Osteoarthritis of knees, bilateral 09/26/2021   Lumbar radiculopathy 12/24/2020   Coronary atherosclerosis 01/24/2019  Morbid obesity ([place]) 08/27/2018   Chronic colitis 03/15/2018   Hypersomnia 05/11/2017   Fatigue 05/11/2017   Multiple lung nodules on CT 02/29/2016   Hypogonadism in male 06/16/2015   History of prostate cancer 06/16/2015   OSA (obstructive sleep apnea) 06/16/2015   Malignant neoplasm of prostate ([place]) 06/16/2015   Diabetes mellitus with neurological manifestation ([place]) 05/06/2014   Type 2 diabetes mellitus without complications ([place]) 38/75/6433   Edema 02/28/2013    ONSET DATE: around 03/2022  REFERRING DIAG: Imbalance  THERAPY DIAG:  R26.89  (ICD-10-CM) - Imbalance   Rationale for Evaluation and Treatment: Rehabilitation  SUBJECTIVE:                                                                                                                                                                                             SUBJECTIVE STATEMENT: Patient reports easily fatigued and LE muscle weaknesss- worse at end of day- feels rubbery- uses cane at times. Reports stress seems to exacerbate the feeling bad, Patient reports limited ability to focus with vision and just generally off balance - comes and goes Pt accompanied by: self  PERTINENT HISTORY: Patient was diagnosed with mild cognitive impaired with history of CVA/TIA with order for PT for imbalance. He also has history significant for Obstructive sleep apnea, anxiety and depression, Prostate Cancer, DM, and history of temporal arteritis, wears hearing aids in both ears.  PAIN:  Are you having pain? No  PRECAUTIONS: Fall  WEIGHT BEARING RESTRICTIONS: No  FALLS: Has patient fallen in last 6 months? Yes. Number of falls 2+= walking up incline with cart  LIVING ENVIRONMENT: Lives with: lives with their spouse Lives in: House/apartment Stairs: Yes: External: 3 steps; can reach both and has a more flat entry without steps- states slight incline Has following equipment at home: Single point cane and Walker - 2 wheeled  PLOF: Independent  PATIENT GOALS: I want to improve my strength and balance and not fall and if I do I want to be able to get up  OBJECTIVE:    Blood pressure= 132/85 left UE    DIAGNOSTIC FINDINGS:    MRI HEAD WITHOUT CONTRAST   TECHNIQUE: Multiplanar, multiecho pulse sequences of the brain and surrounding structures were obtained without intravenous contrast.   Additionally, using NeuroQuant software a 3D volumetric analysis of the brain was performed and is compared to a normative database adjusted for age, gender and intracranial volume.    COMPARISON:  MRI 08/29/2015.   FINDINGS: Brain: Small focus of DWI hyperintensity within the right frontal periventricular white matter with possible subtle ADC correlate. This probably represents a small  subacute infarct. Associated mild T2/FLAIR hyperintensity with additional small T2/FLAIR hyperintensities in the white matter, compatible with mild chronic microvascular ischemic disease.   Vascular: Major arterial flow voids are maintained at the skull base.   Skull and upper cervical spine: Normal marrow signal.   Sinuses/Orbits: Clear sinuses.  No acute orbital findings.   Other: No mastoid effusions.   NeuroQuant Findings:   Volumetric analysis of the brain was performed, with a fully detailed report in [facility]. Briefly, the comparison with age and gender matched reference reveals normative percentile of 1% for whole brain volume, 4% for cortical gray matter, 9% for cerebral white matter and 23% for the hippocampi. Subjectively, cerebral volume loss not appear this severe.   IMPRESSION: 1. Probable small subacute infarct within the right frontal white matter. Mild chronic microvascular ischemic disease. 2. NeuroQuant volumetric analysis of the brain, see details on [employer].     Electronically Signed   By: Margaretha Sheffield M.D.   On: 09/06/2022 10:47  COGNITION: Overall cognitive status: Impaired   SENSATION: WFL  COORDINATION: WNL  EDEMA:  None noted   POSTURE: rounded shoulders and forward head  LOWER EXTREMITY ROM:     Active  Right Eval Left Eval  Hip flexion    Hip extension    Hip abduction    Hip adduction    Hip internal rotation    Hip external rotation    Knee flexion    Knee extension    Ankle dorsiflexion    Ankle plantarflexion    Ankle inversion    Ankle eversion     (Blank rows = not tested)  LOWER EXTREMITY MMT:    MMT Right Eval Left Eval  Hip flexion 4 4  Hip extension 4 4  Hip abduction 4 4  Hip  adduction 4 4  Hip internal rotation 4 4  Hip external rotation 4 4  Knee flexion 4 4  Knee extension 4 4  Ankle dorsiflexion 4 4  Ankle plantarflexion    Ankle inversion    Ankle eversion    (Blank rows = not tested)    TRANSFERS: Assistive device utilized: None  Sit to stand: Complete Independence Stand to sit: Complete Independence Chair to chair: Complete Independence Floor: Not tested    GAIT: Gait pattern: wide BOS Distance walked: 450 Assistive device utilized: None Level of assistance: SBA Comments: unsteady with some scissoring at times.  FUNCTIONAL TESTS:  30 seconds chair stand test 6 minute walk test: 450 feet Berg Balance Scale: 45/56 TUG: 30.72 sec without UE support  PATIENT SURVEYS:  FOTO 41  TODAY'S TREATMENT:                                                                                                                              DATE: PT evaluation 01/01/2023    PATIENT EDUCATION: Education details: [place] of PT and plan of care Person educated: Patient Education method: Explanation, Demonstration, Tactile  cues, and Verbal cues Education comprehension: verbalized understanding, returned demonstration, verbal cues required, tactile cues required, and needs further education  HOME EXERCISE PROGRAM: To be initiated next 1-2 visits  GOALS: Goals reviewed with patient? Yes  SHORT TERM GOALS: Target date: 02/12/2023  Pt will be independent with HEP in order to improve strength and balance in order to decrease fall risk and improve function at home and work Baseline: EVAL:  Goal status: INITIAL  2.  Patient will demo improved left hip flex strength enough to raise his leg high enough to don sock/shoe on his own Baseline: Eval= Patient unable to perform Goal status: INITIAL  3.  Pt will decrease 5TSTS by at least 3 seconds in order to demonstrate clinically significant improvement in LE strength. Baseline: EVAL= 22.77 Goal status:  INITIAL   LONG TERM GOALS: Target date: 03/26/2023  Pt will improve FOTO to target score of 48 to display perceived improvements in ability to complete ADL's.  Baseline: EVAL: 41 Goal status: INITIAL  2.  Patient will perform transfer from floor to chair with modified independence to improve his ability to recover in home from any future falls.  Baseline: Eval: Patient reports unable to get up from floor on his own.   Goal status: INITIAL  3.  Pt will decrease 5TSTS by at least 8 seconds in order to demonstrate clinically significant improvement in LE strength. Baseline: EVAL= 22.77 sec Goal status: INITIAL  4.  Pt will improve BERG by at least 3 points in order to demonstrate clinically significant improvement in balance.    Baseline: EVAL= 45/56 Goal status: INITIAL  5.  Pt will decrease TUG to below 14 seconds/decrease in order to demonstrate decreased fall risk. Baseline: 30.72 without device Goal status: INITIAL  6.  Pt will increase 6MWT by at least 149m(3249f in order to demonstrate clinically significant improvement in cardiopulmonary endurance and community ambulation  Baseline: EVAL=450 feet Goal status: INITIAL  ASSESSMENT:  CLINICAL IMPRESSION: Patient is a [age].o. male who was seen today for physical therapy evaluation and treatment for imbalance. He presents with some Bilateral LE muscle weakness and per testing presents with balance deficits and at increased risk of falling. He will benefit from skilled PT services to improve his overall balance and restore independent mobility with decreased risk of falling.   OBJECTIVE IMPAIRMENTS: Abnormal gait, cardiopulmonary status limiting activity, decreased activity tolerance, decreased balance, decreased cognition, decreased coordination, decreased endurance, decreased mobility, difficulty walking, and decreased strength.   ACTIVITY LIMITATIONS: carrying, lifting, bending, standing, squatting, and stairs  PARTICIPATION  LIMITATIONS: cleaning, laundry, shopping, community activity, and yard work  PERSONAL FACTORS: 1-2 comorbidities: HTN, DM  are also affecting patient's functional outcome.   REHAB POTENTIAL: Good  CLINICAL DECISION MAKING: Evolving/moderate complexity  EVALUATION COMPLEXITY: Moderate  PLAN:  PT FREQUENCY: 1-2x/week  PT DURATION: 12 weeks  PLANNED INTERVENTIONS: Therapeutic exercises, Therapeutic activity, Neuromuscular re-education, Balance training, Gait training, Patient/Family education, Self Care, Joint mobilization, Stair training, Vestibular training, Canalith repositioning, DME instructions, Dry Needling, Spinal mobilization, and Manual therapy  PLAN FOR NEXT SESSION: Implement balance training and LE strengthening next visit.    JeLewis MoccasinPT 01/02/2023, 1:25 PM

## 2023-01-02 ENCOUNTER — Ambulatory Visit: Payer: PPO

## 2023-01-02 DIAGNOSIS — F419 Anxiety disorder, unspecified: Secondary | ICD-10-CM | POA: Diagnosis not present

## 2023-01-02 DIAGNOSIS — F331 Major depressive disorder, recurrent, moderate: Secondary | ICD-10-CM | POA: Diagnosis not present

## 2023-01-04 ENCOUNTER — Ambulatory Visit: Payer: PPO

## 2023-01-04 DIAGNOSIS — R2689 Other abnormalities of gait and mobility: Secondary | ICD-10-CM

## 2023-01-04 DIAGNOSIS — R41841 Cognitive communication deficit: Secondary | ICD-10-CM

## 2023-01-04 DIAGNOSIS — R296 Repeated falls: Secondary | ICD-10-CM

## 2023-01-04 DIAGNOSIS — M6281 Muscle weakness (generalized): Secondary | ICD-10-CM

## 2023-01-04 DIAGNOSIS — R278 Other lack of coordination: Secondary | ICD-10-CM

## 2023-01-04 DIAGNOSIS — R262 Difficulty in walking, not elsewhere classified: Secondary | ICD-10-CM

## 2023-01-04 DIAGNOSIS — R2681 Unsteadiness on feet: Secondary | ICD-10-CM

## 2023-01-04 DIAGNOSIS — R269 Unspecified abnormalities of gait and mobility: Secondary | ICD-10-CM

## 2023-01-04 NOTE — Therapy (Signed)
OUTPATIENT PHYSICAL THERAPY NEURO EVALUATION   Patient Name: Edward King MRN: [id number] DOB:1951-03-22, 72 y.o., male Today's Date: 01/04/2023   PCP: Eugenia Pancoast, FNP REFERRING PROVIDER: Dr. Jennings Books  END OF SESSION:  PT End of Session - 01/04/23 1220     Visit Number 2    Number of Visits 24    Date for PT Re-Evaluation 03/26/23    Progress Note Due on Visit 10    PT Start Time 1151    PT Stop Time 1233    PT Time Calculation (min) 42 min    Equipment Utilized During Treatment Gait belt    Activity Tolerance Patient tolerated treatment well    Behavior During Therapy WFL for tasks assessed/performed              Past Medical History:  Diagnosis Date   Arthritis    Cat bite 07/31/2018   Cellulitis of right leg without foot    Colitis    Depression    Diabetes mellitus without complication ([place])    Giant cell arteritis ([place]) 06/15/2015   History of chicken pox    History of measles    History of mumps    Hypertension    Lymph edema    both legs.  mostly left   Neuromuscular disorder ([place])    Prostate CA ([place]) 2016   Radiation seed implants.   Stroke [facility])    Temporal arteritis ([place])    Clinically dx Dr. Jefm Bryant. Negative temporal artrtery bx   Wears hearing aid in both ears    Past Surgical History:  Procedure Laterality Date   ABDOMINOPLASTY     Carotid Doppler Ultrasound  08/22/2014   [facility]; Minimal plaque right. Minimal thisckening left Anterograde flow vertebrals   CATARACT EXTRACTION W/PHACO Right 07/03/2022   Procedure: CATARACT EXTRACTION PHACO AND INTRAOCULAR LENS PLACEMENT (IOC) RIGHT;  Surgeon: Eulogio Bear, MD;  Location: [place];  Service: Ophthalmology;  Laterality: Right;  Diabetic 2.36 00:18.8   CATARACT EXTRACTION W/PHACO Left 07/17/2022   Procedure: CATARACT EXTRACTION PHACO AND INTRAOCULAR LENS PLACEMENT ([place]) LEFT DIABETIC;  Surgeon: Eulogio Bear, MD;  Location: [place];  Service:  Ophthalmology;  Laterality: Left;  Diabetic 3.06 00:23.1   COLONOSCOPY WITH PROPOFOL N/A 02/13/2018   Procedure: COLONOSCOPY WITH PROPOFOL;  Surgeon: Manya Silvas, MD;  [facility] ENDOSCOPY;  Service: Endoscopy;  Laterality: N/A;   CT of the head  08/22/2014   [facility]. Normal   DOPPLER ECHOCARDIOGRAPHY  08/29/2014   EF=60-65%. Normal LVEF   Myocardial Perfusion Scan  09/10/2012   Poor exercsie tolerance, but no evidence of stress induced myocardial ischemia. EF=66%. Dr. Clayborn Bigness   NASAL SEPTOPLASTY W/ TURBINOPLASTY Bilateral 02/10/2016   Procedure: NASAL SEPTOPLASTY WITH INFERIOR TURBINATE REDUCTION;  Surgeon: Carloyn Manner, MD;  Location: [facility] ORS;  Service: ENT;  Laterality: Bilateral;   pannectomy     TEMPORAL ARTERY BIOPSY / LIGATION Left    Patient Active Problem List   Diagnosis Date Noted   MCI (mild cognitive impairment) with memory loss 12/21/2022   Family history of colon cancer in mother 12/21/2022   Decreased pulses in feet 12/21/2022   Pedal edema 12/21/2022   Onychomycosis 12/21/2022   History of arterial ischemic stroke 09/20/2022   Mixed hyperlipidemia 09/13/2022   Crohn's disease of colon without complication ([place]) 93/26/7124   Hypertension associated with diabetes ([place]) 05/05/2022   Osteoarthritis of knees, bilateral 09/26/2021   Lumbar radiculopathy 12/24/2020   Coronary atherosclerosis 01/24/2019  Morbid obesity ([place]) 08/27/2018   Chronic colitis 03/15/2018   Hypersomnia 05/11/2017   Fatigue 05/11/2017   Multiple lung nodules on CT 02/29/2016   Hypogonadism in male 06/16/2015   History of prostate cancer 06/16/2015   OSA (obstructive sleep apnea) 06/16/2015   Malignant neoplasm of prostate ([place]) 06/16/2015   Diabetes mellitus with neurological manifestation ([place]) 05/06/2014   Type 2 diabetes mellitus without complications ([place]) 54/62/7035   Edema 02/28/2013    ONSET DATE: around 03/2022  REFERRING DIAG: Imbalance  THERAPY DIAG:  R26.89  (ICD-10-CM) - Imbalance   Rationale for Evaluation and Treatment: Rehabilitation  SUBJECTIVE:                                                                                                                                                                                             SUBJECTIVE STATEMENT: Patient reports some ongoing intermittent difficulty with focusing with eyes at times and continued staggering when walking     Pt accompanied by: self  PERTINENT HISTORY: Patient was diagnosed with mild cognitive impaired with history of CVA/TIA with order for PT for imbalance. He also has history significant for Obstructive sleep apnea, anxiety and depression, Prostate Cancer, DM, and history of temporal arteritis, wears hearing aids in both ears.  PAIN:  Are you having pain? No  PRECAUTIONS: Fall  WEIGHT BEARING RESTRICTIONS: No  FALLS: Has patient fallen in last 6 months? Yes. Number of falls 2+= walking up incline with cart  LIVING ENVIRONMENT: Lives with: lives with their spouse Lives in: House/apartment Stairs: Yes: External: 3 steps; can reach both and has a more flat entry without steps- states slight incline Has following equipment at home: Single point cane and Walker - 2 wheeled  PLOF: Independent  PATIENT GOALS: I want to improve my strength and balance and not fall and if I do I want to be able to get up  OBJECTIVE:      DIAGNOSTIC FINDINGS:    MRI HEAD WITHOUT CONTRAST   TECHNIQUE: Multiplanar, multiecho pulse sequences of the brain and surrounding structures were obtained without intravenous contrast.   Additionally, using NeuroQuant software a 3D volumetric analysis of the brain was performed and is compared to a normative database adjusted for age, gender and intracranial volume.   COMPARISON:  MRI 08/29/2015.   FINDINGS: Brain: Small focus of DWI hyperintensity within the right frontal periventricular white matter with possible subtle ADC  correlate. This probably represents a small subacute infarct. Associated mild T2/FLAIR hyperintensity with additional small T2/FLAIR hyperintensities in the white matter, compatible with mild chronic microvascular ischemic disease.   Vascular: Major arterial flow voids  are maintained at the skull base.   Skull and upper cervical spine: Normal marrow signal.   Sinuses/Orbits: Clear sinuses.  No acute orbital findings.   Other: No mastoid effusions.   NeuroQuant Findings:   Volumetric analysis of the brain was performed, with a fully detailed report in [facility]. Briefly, the comparison with age and gender matched reference reveals normative percentile of 1% for whole brain volume, 4% for cortical gray matter, 9% for cerebral white matter and 23% for the hippocampi. Subjectively, cerebral volume loss not appear this severe.   IMPRESSION: 1. Probable small subacute infarct within the right frontal white matter. Mild chronic microvascular ischemic disease. 2. NeuroQuant volumetric analysis of the brain, see details on [employer].     Electronically Signed   By: Margaretha Sheffield M.D.   On: 09/06/2022 10:47  COGNITION: Overall cognitive status: Impaired   SENSATION: WFL  COORDINATION: WNL  EDEMA:  None noted   POSTURE: rounded shoulders and forward head  LOWER EXTREMITY ROM:     Active  Right Eval Left Eval  Hip flexion    Hip extension    Hip abduction    Hip adduction    Hip internal rotation    Hip external rotation    Knee flexion    Knee extension    Ankle dorsiflexion    Ankle plantarflexion    Ankle inversion    Ankle eversion     (Blank rows = not tested)  LOWER EXTREMITY MMT:    MMT Right Eval Left Eval  Hip flexion 4 4  Hip extension 4 4  Hip abduction 4 4  Hip adduction 4 4  Hip internal rotation 4 4  Hip external rotation 4 4  Knee flexion 4 4  Knee extension 4 4  Ankle dorsiflexion 4 4  Ankle plantarflexion    Ankle  inversion    Ankle eversion    (Blank rows = not tested)    TRANSFERS: Assistive device utilized: None  Sit to stand: Complete Independence Stand to sit: Complete Independence Chair to chair: Complete Independence Floor: Not tested    GAIT: Gait pattern: wide BOS Distance walked: 450 Assistive device utilized: None Level of assistance: SBA Comments: unsteady with some scissoring at times.  FUNCTIONAL TESTS:  30 seconds chair stand test 6 minute walk test: 450 feet Berg Balance Scale: 45/56 TUG: 30.72 sec without UE support  PATIENT SURVEYS:  FOTO 41  TODAY'S TREATMENT:                                                                                                                              DATE:   01/04/2023  Therex:   Sit to stand with arms crossed shoulders x 10 reps (increased difficulty with increased reps due to weakness)  Calf raises x 15 reps each LE   NMR:   Step tap onto 6" block x 20 reps each LE (attempted without UE support on last  10 - unsteady yet able to accomplish)  Step up onto 6" block with min BUE Support Step up/over 1/2 foam roll x 15 reps each LE  Tandem standing -attempting to hold as long up to 20 sec- Able to hold only 5-6 sec with left LE in front and approx 18 with right LE.     PATIENT EDUCATION: Education details: Pupose of PT and plan of care Person educated: Patient Education method: Explanation, Demonstration, Tactile cues, and Verbal cues Education comprehension: verbalized understanding, returned demonstration, verbal cues required, tactile cues required, and needs further education  HOME EXERCISE PROGRAM: Access Code: [email or web address] URL: [email or web address] Date: 01/04/2023 Prepared by: Sande Brothers  Exercises - Sit to Stand with Arms Crossed  - 1 x daily - 3 x weekly - 3 sets - 10 reps - Standing Forward Step Taps with Counter Support  - 1 x daily - 3 x weekly - 3 sets - 10 reps - Forward Step Up  with Counter Support  - 1 x daily - 3 x weekly - 3 sets - 10 reps - Standing Tandem Balance with Counter Support  - 1 x daily - 3 x weekly - 3 sets - 20 hold - Side Step Over  - 1 x daily - 3 x weekly - 3 sets - 10 reps - Heel Raises with Counter Support  - 1 x daily - 3 x weekly - 3 sets - 10 reps  GOALS: Goals reviewed with patient? Yes  SHORT TERM GOALS: Target date: 02/12/2023  Pt will be independent with HEP in order to improve strength and balance in order to decrease fall risk and improve function at home and work Baseline: EVAL:  Goal status: INITIAL  2.  Patient will demo improved left hip flex strength enough to raise his leg high enough to don sock/shoe on his own Baseline: Eval= Patient unable to perform Goal status: INITIAL  3.  Pt will decrease 5TSTS by at least 3 seconds in order to demonstrate clinically significant improvement in LE strength. Baseline: EVAL= 22.77 Goal status: INITIAL   LONG TERM GOALS: Target date: 03/26/2023  Pt will improve FOTO to target score of 48 to display perceived improvements in ability to complete ADL's.  Baseline: EVAL: 41 Goal status: INITIAL  2.  Patient will perform transfer from floor to chair with modified independence to improve his ability to recover in home from any future falls.  Baseline: Eval: Patient reports unable to get up from floor on his own.   Goal status: INITIAL  3.  Pt will decrease 5TSTS by at least 8 seconds in order to demonstrate clinically significant improvement in LE strength. Baseline: EVAL= 22.77 sec Goal status: INITIAL  4.  Pt will improve BERG by at least 3 points in order to demonstrate clinically significant improvement in balance.    Baseline: EVAL= 45/56 Goal status: INITIAL  5.  Pt will decrease TUG to below 14 seconds/decrease in order to demonstrate decreased fall risk. Baseline: 30.72 without device Goal status: INITIAL  6.  Pt will increase 6MWT by at least 166m(3270f in order to  demonstrate clinically significant improvement in cardiopulmonary endurance and community ambulation  Baseline: EVAL=450 feet Goal status: INITIAL  ASSESSMENT:  CLINICAL IMPRESSION: Patient presents with good motivation for 2nd visit today. Treatment focused on educating and instructing in LE strengthening and balance activities per plan of care. Patient exhibited some delay in response and some mild confusion understanding instructions. Repetitive VC and visual cues required to complete  therex correctly He will benefit from review and progress to improve his overall strength and safety with mobility. Patient will benefit from continued skilled PT services to improve his overall balance and restore independent mobility with decreased risk of falling.   OBJECTIVE IMPAIRMENTS: Abnormal gait, cardiopulmonary status limiting activity, decreased activity tolerance, decreased balance, decreased cognition, decreased coordination, decreased endurance, decreased mobility, difficulty walking, and decreased strength.   ACTIVITY LIMITATIONS: carrying, lifting, bending, standing, squatting, and stairs  PARTICIPATION LIMITATIONS: cleaning, laundry, shopping, community activity, and yard work  PERSONAL FACTORS: 1-2 comorbidities: HTN, DM  are also affecting patient's functional outcome.   REHAB POTENTIAL: Good  CLINICAL DECISION MAKING: Evolving/moderate complexity  EVALUATION COMPLEXITY: Moderate  PLAN:  PT FREQUENCY: 1-2x/week  PT DURATION: 12 weeks  PLANNED INTERVENTIONS: Therapeutic exercises, Therapeutic activity, Neuromuscular re-education, Balance training, Gait training, Patient/Family education, Self Care, Joint mobilization, Stair training, Vestibular training, Canalith repositioning, DME instructions, Dry Needling, Spinal mobilization, and Manual therapy  PLAN FOR NEXT SESSION: Continue with balance training and LE strengthening next visit.    Lewis Moccasin, PT 01/04/2023, 1:27  PM

## 2023-01-08 ENCOUNTER — Ambulatory Visit: Payer: PPO | Admitting: Speech Pathology

## 2023-01-08 DIAGNOSIS — T148XXA Other injury of unspecified body region, initial encounter: Secondary | ICD-10-CM | POA: Insufficient documentation

## 2023-01-08 DIAGNOSIS — M545 Low back pain, unspecified: Secondary | ICD-10-CM | POA: Diagnosis not present

## 2023-01-08 DIAGNOSIS — M47816 Spondylosis without myelopathy or radiculopathy, lumbar region: Secondary | ICD-10-CM | POA: Insufficient documentation

## 2023-01-08 DIAGNOSIS — M47896 Other spondylosis, lumbar region: Secondary | ICD-10-CM | POA: Diagnosis not present

## 2023-01-09 ENCOUNTER — Telehealth: Payer: Self-pay | Admitting: Family

## 2023-01-09 ENCOUNTER — Ambulatory Visit: Payer: PPO

## 2023-01-09 NOTE — Telephone Encounter (Signed)
Patient called and stated he fell yesterday and level of pain when he moves is a 9 but when steady its a 7. Patient was sent to access nurse.

## 2023-01-09 NOTE — Telephone Encounter (Signed)
[place] [facility] - Client TELEPHONE ADVICE RECORD AccessNurse Patient Name: Edward King Gender: Male DOB: 1951-09-13 Age: 72 Y 3 M 24 D Return Phone Number: [phone] (Primary) Address: City/ State/ Zip: [place]  [address] Client [facility] - Client Client Site [place] - Day Provider AA - PHYSICIAN, NOT LISTED- MD Contact Type Call Who Is Calling Patient / Member / Family / Caregiver Call Type Triage / Clinical Relationship To Patient Self Return Phone Number [phone] (Primary) Chief Complaint Back Pain - General Reason for Call Symptomatic / Request for [place] states that he fell in his bathroom a few nights ago and is still having pain in his back that is a 7-9 out of 10. Caller states that he was seen in the orthopedic office yesterday for this pain and it was recommended that he see his PCP to get a referral for a CT scan. He has a bruise on his back. Caller was tranferred to this service by the office as they state no appts available today. Translation No Nurse Assessment Nurse: Altamease Oiler, RN, Adriana Date/Time (Eastern Time): 01/09/2023 10:29:46 AM Confirm and document reason for call. If symptomatic, describe symptoms. ---caller states pt slipped and fell backward. fell on sunday still experiencing pain. constant pain of 6/10, 9/10 with movement Does the patient have any new or worsening symptoms? ---Yes Will a triage be completed? ---Yes Related visit to physician within the last 2 weeks? ---Yes Does the PT have any chronic conditions? (i.e. diabetes, asthma, this includes High risk factors for pregnancy, etc.) ---Yes List chronic conditions. ---htn diabetes Is this a behavioral health or substance abuse call? ---No Guidelines Guideline Title Affirmed Question Affirmed Notes Nurse Date/Time (Eastern Time) Back Injury [1] MODERATE pain  (e.g., interferes with normal activities) AND [2] high-risk adult (e.g., age > 29 years, Sarajane Marek 01/09/2023 10:33:01 AM PLEASE NOTE: All timestamps contained within this report are represented as [place] Standard Time. CONFIDENTIALTY NOTICE: This fax transmission is intended only for the addressee. It contains information that is legally privileged, confidential or otherwise protected from use or disclosure. If you are not the intended recipient, you are strictly prohibited from reviewing, disclosing, copying using or disseminating any of this information or taking any action in reliance on or regarding this information. If you have received this fax in error, please notify us immediately by telephone so that we can arrange for its return to [place]. Phone: [phone], Toll-Free: [phone], Fax: [phone] Page: 2 of 2 Call Id: [id number] Guidelines Guideline Title Affirmed Question Affirmed Notes Nurse Date/Time Eilene Ghazi Time) osteoporosis, chronic steroid use) Disp. Time Eilene Ghazi Time) Disposition Final User 01/09/2023 10:37:34 AM See PCP within 24 Hours Yes Altamease Oiler RN, Fabio Bering Final Disposition 01/09/2023 10:37:34 AM See PCP within 24 Hours Yes Altamease Oiler, RN, Fabio Bering Caller Disagree/Comply Comply Caller Understands Yes PreDisposition Call Doctor Care Advice Given Per Guideline SEE PCP WITHIN 24 HOURS: PAIN MEDICINES: * ACETAMINOPHEN - REGULAR STRENGTH TYLENOL: Take 650 mg (two 325 mg pills) by mouth every 4 to 6 hours as needed. Each Regular Strength Tylenol pill has 325 mg of acetaminophen. The most you should take is 10 pills a day (3,250 mg total). Note: In [place], the maximum is 12 pills a day (3,900 mg total). * IBUPROFEN (E.G., MOTRIN, ADVIL): Take 400 mg (two 200 mg pills) by mouth every 6 hours. The most you should take is 6 pills a day (1,200 mg total).  CALL BACK IF: * You become worse CARE ADVICE given per Back Injury (Adult) guideline. Referrals REFERRED TO PCP  OFFIC

## 2023-01-09 NOTE — Telephone Encounter (Signed)
Happy to hear pt is going to ER however tomorrow's appt with Dr. Lorelei Pont was only for an acute visit correct?   If this is a yes, pt needs to reschedule ER/ fall f/u with me. I have appointment availabilities Monday.

## 2023-01-09 NOTE — Telephone Encounter (Signed)
I spoke with pt; see note below access nurse note and pt notified and voiced understanding. Pt said his wife is with him and he will go to ED now for eval and any needed testing. I advised pt I would cancel appt with Dr Lorelei Pont on 01/10/23 but pt said he does not want to cancel that appt. I asked if he would let me speak with his wife to let her be aware of T Dugal FNP note and pt said no he would take care of that. Pt did confirm again that he will go to ED now. Sending note to Red Christians FNP and D[facility]al pool.

## 2023-01-09 NOTE — Telephone Encounter (Signed)
Please triage appropriately  I would recommend pt be seen more acutely for his recent fall to evaluate h/o stroke

## 2023-01-10 ENCOUNTER — Ambulatory Visit: Payer: PPO | Admitting: Family Medicine

## 2023-01-10 ENCOUNTER — Ambulatory Visit: Payer: PPO | Admitting: Physical Therapy

## 2023-01-10 ENCOUNTER — Ambulatory Visit: Payer: PPO | Admitting: Speech Pathology

## 2023-01-10 NOTE — Telephone Encounter (Signed)
Looks like has appointment with Dr. Lorelei Pont today.

## 2023-01-10 NOTE — Telephone Encounter (Signed)
Called patient yesterday no answer did not get seen at ED

## 2023-01-15 ENCOUNTER — Ambulatory Visit: Payer: PPO

## 2023-01-16 ENCOUNTER — Ambulatory Visit: Payer: PPO | Admitting: Speech Pathology

## 2023-01-16 ENCOUNTER — Telehealth: Payer: Self-pay

## 2023-01-16 ENCOUNTER — Ambulatory Visit: Payer: PPO | Admitting: Physical Therapy

## 2023-01-16 DIAGNOSIS — E1159 Type 2 diabetes mellitus with other circulatory complications: Secondary | ICD-10-CM | POA: Diagnosis not present

## 2023-01-16 DIAGNOSIS — F32A Depression, unspecified: Secondary | ICD-10-CM | POA: Diagnosis not present

## 2023-01-16 DIAGNOSIS — I152 Hypertension secondary to endocrine disorders: Secondary | ICD-10-CM | POA: Diagnosis not present

## 2023-01-16 DIAGNOSIS — G479 Sleep disorder, unspecified: Secondary | ICD-10-CM | POA: Diagnosis not present

## 2023-01-16 DIAGNOSIS — R2689 Other abnormalities of gait and mobility: Secondary | ICD-10-CM | POA: Diagnosis not present

## 2023-01-16 DIAGNOSIS — Z6841 Body Mass Index (BMI) 40.0 and over, adult: Secondary | ICD-10-CM | POA: Diagnosis not present

## 2023-01-16 DIAGNOSIS — G309 Alzheimer's disease, unspecified: Secondary | ICD-10-CM | POA: Diagnosis not present

## 2023-01-16 DIAGNOSIS — F0280 Dementia in other diseases classified elsewhere without behavioral disturbance: Secondary | ICD-10-CM | POA: Diagnosis not present

## 2023-01-16 DIAGNOSIS — F0150 Vascular dementia without behavioral disturbance: Secondary | ICD-10-CM | POA: Diagnosis not present

## 2023-01-16 DIAGNOSIS — F419 Anxiety disorder, unspecified: Secondary | ICD-10-CM | POA: Diagnosis not present

## 2023-01-16 DIAGNOSIS — Z8673 Personal history of transient ischemic attack (TIA), and cerebral infarction without residual deficits: Secondary | ICD-10-CM | POA: Diagnosis not present

## 2023-01-16 NOTE — Telephone Encounter (Signed)
PA on covermymeds (Key: [id number]). PA approved 29-JAN-24:30-DEC-24 [employer]lop[employer] 8% EX SOLN Quantity:7

## 2023-01-16 NOTE — Therapy (Deleted)
OUTPATIENT PHYSICAL THERAPY NEURO EVALUATION   Patient Name: Edward King MRN: [id number] DOB:1951-10-24, 72 y.o., male Today's Date: 01/16/2023   PCP: Eugenia Pancoast, FNP REFERRING PROVIDER: Dr. Jennings Books  END OF SESSION:     Past Medical History:  Diagnosis Date   Arthritis    Cat bite 07/31/2018   Cellulitis of right leg without foot    Colitis    Depression    Diabetes mellitus without complication ([place])    Giant cell arteritis ([place]) 06/15/2015   History of chicken pox    History of measles    History of mumps    Hypertension    Lymph edema    both legs.  mostly left   Neuromuscular disorder ([place])    Prostate CA ([place]) 2016   Radiation seed implants.   Stroke [facility])    Temporal arteritis ([place])    Clinically dx Dr. Jefm Bryant. Negative temporal artrtery bx   Wears hearing aid in both ears    Past Surgical History:  Procedure Laterality Date   ABDOMINOPLASTY     Carotid Doppler Ultrasound  08/22/2014   [facility]; Minimal plaque right. Minimal thisckening left Anterograde flow vertebrals   CATARACT EXTRACTION W/PHACO Right 07/03/2022   Procedure: CATARACT EXTRACTION PHACO AND INTRAOCULAR LENS PLACEMENT (IOC) RIGHT;  Surgeon: Eulogio Bear, MD;  Location: [place];  Service: Ophthalmology;  Laterality: Right;  Diabetic 2.36 00:18.8   CATARACT EXTRACTION W/PHACO Left 07/17/2022   Procedure: CATARACT EXTRACTION PHACO AND INTRAOCULAR LENS PLACEMENT ([place]) LEFT DIABETIC;  Surgeon: Eulogio Bear, MD;  Location: [place];  Service: Ophthalmology;  Laterality: Left;  Diabetic 3.06 00:23.1   COLONOSCOPY WITH PROPOFOL N/A 02/13/2018   Procedure: COLONOSCOPY WITH PROPOFOL;  Surgeon: Manya Silvas, MD;  Location: [facility] ENDOSCOPY;  Service: Endoscopy;  Laterality: N/A;   CT of the head  08/22/2014   [facility]. Normal   DOPPLER ECHOCARDIOGRAPHY  08/29/2014   EF=60-65%. Normal LVEF   Myocardial Perfusion Scan  09/10/2012   Poor exercsie tolerance, but  no evidence of stress induced myocardial ischemia. EF=66%. Dr. Clayborn Bigness   NASAL SEPTOPLASTY W/ TURBINOPLASTY Bilateral 02/10/2016   Procedure: NASAL SEPTOPLASTY WITH INFERIOR TURBINATE REDUCTION;  Surgeon: Carloyn Manner, MD;  Location: [facility] ORS;  Service: ENT;  Laterality: Bilateral;   pannectomy     TEMPORAL ARTERY BIOPSY / LIGATION Left    Patient Active Problem List   Diagnosis Date Noted   MCI (mild cognitive impairment) with memory loss 12/21/2022   Family history of colon cancer in mother 12/21/2022   Decreased pulses in feet 12/21/2022   Pedal edema 12/21/2022   Onychomycosis 12/21/2022   History of arterial ischemic stroke 09/20/2022   Mixed hyperlipidemia 09/13/2022   Crohn's disease of colon without complication ([place]) [id number]   Hypertension associated with diabetes ([place]) 05/05/2022   Osteoarthritis of knees, bilateral 09/26/2021   Lumbar radiculopathy 12/24/2020   Coronary atherosclerosis 01/24/2019   Morbid obesity ([place]) 08/27/2018   Chronic colitis 03/15/2018   Hypersomnia 05/11/2017   Fatigue 05/11/2017   Multiple lung nodules on CT 02/29/2016   Hypogonadism in male 06/16/2015   History of prostate cancer 06/16/2015   OSA (obstructive sleep apnea) 06/16/2015   Malignant neoplasm of prostate ([place]) 06/16/2015   Diabetes mellitus with neurological manifestation ([place]) 05/06/2014   Type 2 diabetes mellitus without complications ([place]) [id number]   Edema 02/28/2013    ONSET DATE: around 03/2022  REFERRING DIAG: Imbalance  THERAPY DIAG:  R26.89 (ICD-10-CM) - Imbalance   Rationale  for Evaluation and Treatment: Rehabilitation  SUBJECTIVE:                                                                                                                                                                                             SUBJECTIVE STATEMENT: Patient reports some ongoing intermittent difficulty with focusing with eyes at times and continued staggering when  walking     Pt accompanied by: self  PERTINENT HISTORY: Patient was diagnosed with mild cognitive impaired with history of CVA/TIA with order for PT for imbalance. He also has history significant for Obstructive sleep apnea, anxiety and depression, Prostate Cancer, DM, and history of temporal arteritis, wears hearing aids in both ears.  PAIN:  Are you having pain? No  PRECAUTIONS: Fall  WEIGHT BEARING RESTRICTIONS: No  FALLS: Has patient fallen in last 6 months? Yes. Number of falls 2+= walking up incline with cart  LIVING ENVIRONMENT: Lives with: lives with their spouse Lives in: House/apartment Stairs: Yes: External: 3 steps; can reach both and has a more flat entry without steps- states slight incline Has following equipment at home: Single point cane and Walker - 2 wheeled  PLOF: Independent  PATIENT GOALS: I want to improve my strength and balance and not fall and if I do I want to be able to get up  OBJECTIVE:      DIAGNOSTIC FINDINGS:    MRI HEAD WITHOUT CONTRAST   TECHNIQUE: Multiplanar, multiecho pulse sequences of the brain and surrounding structures were obtained without intravenous contrast.   Additionally, using NeuroQuant software a 3D volumetric analysis of the brain was performed and is compared to a normative database adjusted for age, gender and intracranial volume.   COMPARISON:  MRI 08/29/2015.   FINDINGS: Brain: Small focus of DWI hyperintensity within the right frontal periventricular white matter with possible subtle ADC correlate. This probably represents a small subacute infarct. Associated mild T2/FLAIR hyperintensity with additional small T2/FLAIR hyperintensities in the white matter, compatible with mild chronic microvascular ischemic disease.   Vascular: Major arterial flow voids are maintained at the skull base.   Skull and upper cervical spine: Normal marrow signal.   Sinuses/Orbits: Clear sinuses.  No acute orbital  findings.   Other: No mastoid effusions.   NeuroQuant Findings:   Volumetric analysis of the brain was performed, with a fully detailed report in [facility]. Briefly, the comparison with age and gender matched reference reveals normative percentile of 1% for whole brain volume, 4% for cortical gray matter, 9% for cerebral white matter and 23% for the hippocampi. Subjectively, cerebral volume loss not appear this severe.   IMPRESSION: 1.  Probable small subacute infarct within the right frontal white matter. Mild chronic microvascular ischemic disease. 2. NeuroQuant volumetric analysis of the brain, see details on [employer].     Electronically Signed   By: Margaretha Sheffield M.D.   On: 09/06/2022 10:47  COGNITION: Overall cognitive status: Impaired   SENSATION: WFL  COORDINATION: WNL  EDEMA:  None noted   POSTURE: rounded shoulders and forward head  LOWER EXTREMITY ROM:     Active  Right Eval Left Eval  Hip flexion    Hip extension    Hip abduction    Hip adduction    Hip internal rotation    Hip external rotation    Knee flexion    Knee extension    Ankle dorsiflexion    Ankle plantarflexion    Ankle inversion    Ankle eversion     (Blank rows = not tested)  LOWER EXTREMITY MMT:    MMT Right Eval Left Eval  Hip flexion 4 4  Hip extension 4 4  Hip abduction 4 4  Hip adduction 4 4  Hip internal rotation 4 4  Hip external rotation 4 4  Knee flexion 4 4  Knee extension 4 4  Ankle dorsiflexion 4 4  Ankle plantarflexion    Ankle inversion    Ankle eversion    (Blank rows = not tested)    TRANSFERS: Assistive device utilized: None  Sit to stand: Complete Independence Stand to sit: Complete Independence Chair to chair: Complete Independence Floor: Not tested    GAIT: Gait pattern: wide BOS Distance walked: 450 Assistive device utilized: None Level of assistance: SBA Comments: unsteady with some scissoring at times.  FUNCTIONAL  TESTS:  30 seconds chair stand test 6 minute walk test: 450 feet Berg Balance Scale: 45/56 TUG: 30.72 sec without UE support  PATIENT SURVEYS:  FOTO 41  TODAY'S TREATMENT:                                                                                                                              DATE:   01/04/2023  Therex:   Sit to stand with arms crossed shoulders x 10 reps (increased difficulty with increased reps due to weakness)  Calf raises x 15 reps each LE   NMR:   Step tap onto 6" block x 20 reps each LE (attempted without UE support on last 10 - unsteady yet able to accomplish)  Step up onto 6" block with min BUE Support Step up/over 1/2 foam roll x 15 reps each LE  Tandem standing -attempting to hold as long up to 20 sec- Able to hold only 5-6 sec with left LE in front and approx 18 with right LE.     PATIENT EDUCATION: Education details: Pupose of PT and plan of care Person educated: Patient Education method: Explanation, Demonstration, Tactile cues, and Verbal cues Education comprehension: verbalized understanding, returned demonstration, verbal cues required, tactile cues required, and needs further  education  HOME EXERCISE PROGRAM: Access Code: [email or web address] URL: [email or web address] Date: 01/04/2023 Prepared by: Sande Brothers  Exercises - Sit to Stand with Arms Crossed  - 1 x daily - 3 x weekly - 3 sets - 10 reps - Standing Forward Step Taps with Counter Support  - 1 x daily - 3 x weekly - 3 sets - 10 reps - Forward Step Up with Counter Support  - 1 x daily - 3 x weekly - 3 sets - 10 reps - Standing Tandem Balance with Counter Support  - 1 x daily - 3 x weekly - 3 sets - 20 hold - Side Step Over  - 1 x daily - 3 x weekly - 3 sets - 10 reps - Heel Raises with Counter Support  - 1 x daily - 3 x weekly - 3 sets - 10 reps  GOALS: Goals reviewed with patient? Yes  SHORT TERM GOALS: Target date: 02/12/2023  Pt will be independent with HEP in  order to improve strength and balance in order to decrease fall risk and improve function at home and work Baseline: EVAL:  Goal status: INITIAL  2.  Patient will demo improved left hip flex strength enough to raise his leg high enough to don sock/shoe on his own Baseline: Eval= Patient unable to perform Goal status: INITIAL  3.  Pt will decrease 5TSTS by at least 3 seconds in order to demonstrate clinically significant improvement in LE strength. Baseline: EVAL= 22.77 Goal status: INITIAL   LONG TERM GOALS: Target date: 03/26/2023  Pt will improve FOTO to target score of 48 to display perceived improvements in ability to complete ADL's.  Baseline: EVAL: 41 Goal status: INITIAL  2.  Patient will perform transfer from floor to chair with modified independence to improve his ability to recover in home from any future falls.  Baseline: Eval: Patient reports unable to get up from floor on his own.   Goal status: INITIAL  3.  Pt will decrease 5TSTS by at least 8 seconds in order to demonstrate clinically significant improvement in LE strength. Baseline: EVAL= 22.77 sec Goal status: INITIAL  4.  Pt will improve BERG by at least 3 points in order to demonstrate clinically significant improvement in balance.    Baseline: EVAL= 45/56 Goal status: INITIAL  5.  Pt will decrease TUG to below 14 seconds/decrease in order to demonstrate decreased fall risk. Baseline: 30.72 without device Goal status: INITIAL  6.  Pt will increase 6MWT by at least 138m(3234f in order to demonstrate clinically significant improvement in cardiopulmonary endurance and community ambulation  Baseline: EVAL=450 feet Goal status: INITIAL  ASSESSMENT:  CLINICAL IMPRESSION: Patient presents with good motivation for 2nd visit today. Treatment focused on educating and instructing in LE strengthening and balance activities per plan of care. Patient exhibited some delay in response and some mild confusion  understanding instructions. Repetitive VC and visual cues required to complete therex correctly He will benefit from review and progress to improve his overall strength and safety with mobility. Patient will benefit from continued skilled PT services to improve his overall balance and restore independent mobility with decreased risk of falling.   OBJECTIVE IMPAIRMENTS: Abnormal gait, cardiopulmonary status limiting activity, decreased activity tolerance, decreased balance, decreased cognition, decreased coordination, decreased endurance, decreased mobility, difficulty walking, and decreased strength.   ACTIVITY LIMITATIONS: carrying, lifting, bending, standing, squatting, and stairs  PARTICIPATION LIMITATIONS: cleaning, laundry, shopping, community activity, and yard work  PERSONAL FACTORS: 1-2 comorbidities: HTN, DM  are also affecting patient's functional outcome.   REHAB POTENTIAL: Good  CLINICAL DECISION MAKING: Evolving/moderate complexity  EVALUATION COMPLEXITY: Moderate  PLAN:  PT FREQUENCY: 1-2x/week  PT DURATION: 12 weeks  PLANNED INTERVENTIONS: Therapeutic exercises, Therapeutic activity, Neuromuscular re-education, Balance training, Gait training, Patient/Family education, Self Care, Joint mobilization, Stair training, Vestibular training, Canalith repositioning, DME instructions, Dry Needling, Spinal mobilization, and Manual therapy  PLAN FOR NEXT SESSION: Continue with balance training and LE strengthening next visit.    Particia Lather, PT 01/16/2023, 8:05 AM

## 2023-01-17 ENCOUNTER — Ambulatory Visit: Payer: PPO

## 2023-01-17 ENCOUNTER — Telehealth: Payer: Self-pay

## 2023-01-17 NOTE — Telephone Encounter (Signed)
Called and spoke to pt over the phone.  Pt stating that he is not sure that he wants to continue with speech therapy, but would like to continue with physical therapy.  Pt advised to talk it over with his wife and Mary-Beth tomorrow at his scheduled appointment.    Pt also stating that he may need to reschedule his PT and Speech appointment next Monday as he will be prepping for colonoscopy on Tuesday.  Advised pt to clarify and speak to front office staff tomorrow in order to clarify needs going forward.    Pt reminded of attendance policy and if he is unable to complete next visit to call and cancel instead of no-showing.  Gwenlyn Saran, PT, DPT Physical Therapist - [facility]  01/17/23, 4:29 PM

## 2023-01-17 NOTE — Therapy (Incomplete)
OUTPATIENT PHYSICAL THERAPY NEURO EVALUATION   Patient Name: Edward King MRN: [id number] DOB:13-Mar-1951, 72 y.o., male Today's Date: 01/17/2023   PCP: Eugenia Pancoast, FNP REFERRING PROVIDER: Dr. 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  mostly left   Neuromuscular disorder ([place])    Prostate CA ([place]) 2016   Radiation seed implants.   Stroke [facility])    Temporal arteritis ([place])    Clinically dx Dr. Jefm Bryant. Negative temporal artrtery bx   Wears hearing aid in both ears    Past Surgical History:  Procedure Laterality Date   ABDOMINOPLASTY     Carotid Doppler Ultrasound  08/22/2014   [facility]; Minimal plaque right. Minimal thisckening left Anterograde flow vertebrals   CATARACT EXTRACTION W/PHACO Right 07/03/2022   Procedure: CATARACT EXTRACTION PHACO AND INTRAOCULAR LENS PLACEMENT (IOC) RIGHT;  Surgeon: Eulogio Bear, MD;  Location: [place];  Service: Ophthalmology;  Laterality: Right;  Diabetic 2.36 00:18.8   CATARACT EXTRACTION W/PHACO Left 07/17/2022   Procedure: CATARACT EXTRACTION PHACO AND INTRAOCULAR LENS PLACEMENT ([place]) LEFT DIABETIC;  Surgeon: Eulogio Bear, MD;  Location: [place];  Service: Ophthalmology;  Laterality: Left;  Diabetic 3.06 00:23.1   COLONOSCOPY WITH PROPOFOL N/A 02/13/2018   Procedure: COLONOSCOPY WITH PROPOFOL;  Surgeon: Manya Silvas, MD;  Location: [facility] ENDOSCOPY;  Service: Endoscopy;  Laterality: N/A;   CT of the head  08/22/2014   [facility]. Normal   DOPPLER ECHOCARDIOGRAPHY  08/29/2014   EF=60-65%. Normal LVEF   Myocardial Perfusion Scan  09/10/2012   Poor exercsie tolerance, but  no evidence of stress induced myocardial ischemia. EF=66%. Dr. Clayborn Bigness   NASAL SEPTOPLASTY W/ TURBINOPLASTY Bilateral 02/10/2016   Procedure: NASAL SEPTOPLASTY WITH INFERIOR TURBINATE REDUCTION;  Surgeon: Carloyn Manner, MD;  Location: [facility] ORS;  Service: ENT;  Laterality: Bilateral;   pannectomy     TEMPORAL ARTERY BIOPSY / LIGATION Left    Patient Active Problem List   Diagnosis Date Noted   MCI (mild cognitive impairment) with memory loss 12/21/2022   Family history of colon cancer in mother 12/21/2022   Decreased pulses in feet 12/21/2022   Pedal edema 12/21/2022   Onychomycosis 12/21/2022   History of arterial ischemic stroke 09/20/2022   Mixed hyperlipidemia 09/13/2022   Crohn's disease of colon without complication ([place]) 54/65/6812   Hypertension associated with diabetes ([place]) 05/05/2022   Osteoarthritis of knees, bilateral 09/26/2021   Lumbar radiculopathy 12/24/2020   Coronary atherosclerosis 01/24/2019   Morbid obesity ([place]) 08/27/2018   Chronic colitis 03/15/2018   Hypersomnia 05/11/2017   Fatigue 05/11/2017   Multiple lung nodules on CT 02/29/2016   Hypogonadism in male 06/16/2015   History of prostate cancer 06/16/2015   OSA (obstructive sleep apnea) 06/16/2015   Malignant neoplasm of prostate ([place]) 06/16/2015   Diabetes mellitus with neurological manifestation ([place]) 05/06/2014   Type 2 diabetes mellitus without complications ([place]) 75/17/0017   Edema 02/28/2013    ONSET DATE: around 03/2022  REFERRING DIAG: Imbalance  THERAPY DIAG:  R26.89 (ICD-10-CM) - Imbalance   Rationale  for Evaluation and Treatment: Rehabilitation  SUBJECTIVE:                                                                                                                                                                                             SUBJECTIVE STATEMENT: *** Patient reports some ongoing intermittent difficulty with focusing with eyes at times and continued staggering when  walking     Pt accompanied by: self  PERTINENT HISTORY: Patient was diagnosed with mild cognitive impaired with history of CVA/TIA with order for PT for imbalance. He also has history significant for Obstructive sleep apnea, anxiety and depression, Prostate Cancer, DM, and history of temporal arteritis, wears hearing aids in both ears.  PAIN:  Are you having pain? No  PRECAUTIONS: Fall  WEIGHT BEARING RESTRICTIONS: No  FALLS: Has patient fallen in last 6 months? Yes. Number of falls 2+= walking up incline with cart  LIVING ENVIRONMENT: Lives with: lives with their spouse Lives in: House/apartment Stairs: Yes: External: 3 steps; can reach both and has a more flat entry without steps- states slight incline Has following equipment at home: Single point cane and Walker - 2 wheeled  PLOF: Independent  PATIENT GOALS: I want to improve my strength and balance and not fall and if I do I want to be able to get up  OBJECTIVE:   DIAGNOSTIC FINDINGS:    MRI HEAD WITHOUT CONTRAST   TECHNIQUE: Multiplanar, multiecho pulse sequences of the brain and surrounding structures were obtained without intravenous contrast.   Additionally, using NeuroQuant software a 3D volumetric analysis of the brain was performed and is compared to a normative database adjusted for age, gender and intracranial volume.   COMPARISON:  MRI 08/29/2015.   FINDINGS: Brain: Small focus of DWI hyperintensity within the right frontal periventricular white matter with possible subtle ADC correlate. This probably represents a small subacute infarct. Associated mild T2/FLAIR hyperintensity with additional small T2/FLAIR hyperintensities in the white matter, compatible with mild chronic microvascular ischemic disease.   Vascular: Major arterial flow voids are maintained at the skull base.   Skull and upper cervical spine: Normal marrow signal.   Sinuses/Orbits: Clear sinuses.  No acute orbital findings.    Other: No mastoid effusions.   NeuroQuant Findings:   Volumetric analysis of the brain was performed, with a fully detailed report in [facility]. Briefly, the comparison with age and gender matched reference reveals normative percentile of 1% for whole brain volume, 4% for cortical gray matter, 9% for cerebral white matter and 23% for the hippocampi. Subjectively, cerebral volume loss not appear this severe.   IMPRESSION: 1. Probable small  subacute infarct within the right frontal white matter. Mild chronic microvascular ischemic disease. 2. NeuroQuant volumetric analysis of the brain, see details on [employer].     Electronically Signed   By: Margaretha Sheffield M.D.   On: 09/06/2022 10:47  COGNITION: Overall cognitive status: Impaired   SENSATION: WFL  COORDINATION: WNL  EDEMA:  None noted   POSTURE: rounded shoulders and forward head  LOWER EXTREMITY ROM:     Active  Right Eval Left Eval  Hip flexion    Hip extension    Hip abduction    Hip adduction    Hip internal rotation    Hip external rotation    Knee flexion    Knee extension    Ankle dorsiflexion    Ankle plantarflexion    Ankle inversion    Ankle eversion     (Blank rows = not tested)  LOWER EXTREMITY MMT:    MMT Right Eval Left Eval  Hip flexion 4 4  Hip extension 4 4  Hip abduction 4 4  Hip adduction 4 4  Hip internal rotation 4 4  Hip external rotation 4 4  Knee flexion 4 4  Knee extension 4 4  Ankle dorsiflexion 4 4  Ankle plantarflexion    Ankle inversion    Ankle eversion    (Blank rows = not tested)    TRANSFERS: Assistive device utilized: None  Sit to stand: Complete Independence Stand to sit: Complete Independence Chair to chair: Complete Independence Floor: Not tested    GAIT: Gait pattern: wide BOS Distance walked: 450 Assistive device utilized: None Level of assistance: SBA Comments: unsteady with some scissoring at times.  FUNCTIONAL TESTS:  30  seconds chair stand test 6 minute walk test: 450 feet Berg Balance Scale: 45/56 TUG: 30.72 sec without UE support  PATIENT SURVEYS:  FOTO 41  TODAY'S TREATMENT: DATE: 01/17/23   Therex:   Sit to stand with arms crossed shoulders x 10 reps (increased difficulty with increased reps due to weakness)  Calf raises x 15 reps each LE   NMR:   Step tap onto 6" block x 20 reps each LE (attempted without UE support on last 10 - unsteady yet able to accomplish)  Step up onto 6" block with min BUE Support Step up/over 1/2 foam roll x 15 reps each LE  Tandem standing -attempting to hold as long up to 20 sec- Able to hold only 5-6 sec with left LE in front and approx 18 with right LE.     PATIENT EDUCATION: Education details: Pupose of PT and plan of care Person educated: Patient Education method: Explanation, Demonstration, Tactile cues, and Verbal cues Education comprehension: verbalized understanding, returned demonstration, verbal cues required, tactile cues required, and needs further education  HOME EXERCISE PROGRAM: Access Code: [email or web address] URL: [email or web address] Date: 01/04/2023 Prepared by: Sande Brothers  Exercises - Sit to Stand with Arms Crossed  - 1 x daily - 3 x weekly - 3 sets - 10 reps - Standing Forward Step Taps with Counter Support  - 1 x daily - 3 x weekly - 3 sets - 10 reps - Forward Step Up with Counter Support  - 1 x daily - 3 x weekly - 3 sets - 10 reps - Standing Tandem Balance with Counter Support  - 1 x daily - 3 x weekly - 3 sets - 20 hold - Side Step Over  - 1 x daily - 3 x weekly - 3 sets - 10 reps - Heel  Raises with Counter Support  - 1 x daily - 3 x weekly - 3 sets - 10 reps  GOALS: Goals reviewed with patient? Yes  SHORT TERM GOALS: Target date: 02/12/2023  Pt will be independent with HEP in order to improve strength and balance in order to decrease fall risk and improve function at home and work Baseline: EVAL:  Goal status:  INITIAL  2.  Patient will demo improved left hip flex strength enough to raise his leg high enough to don sock/shoe on his own Baseline: Eval= Patient unable to perform Goal status: INITIAL  3.  Pt will decrease 5TSTS by at least 3 seconds in order to demonstrate clinically significant improvement in LE strength. Baseline: EVAL= 22.77 Goal status: INITIAL   LONG TERM GOALS: Target date: 03/26/2023  Pt will improve FOTO to target score of 48 to display perceived improvements in ability to complete ADL's.  Baseline: EVAL: 41 Goal status: INITIAL  2.  Patient will perform transfer from floor to chair with modified independence to improve his ability to recover in home from any future falls.  Baseline: Eval: Patient reports unable to get up from floor on his own.   Goal status: INITIAL  3.  Pt will decrease 5TSTS by at least 8 seconds in order to demonstrate clinically significant improvement in LE strength. Baseline: EVAL= 22.77 sec Goal status: INITIAL  4.  Pt will improve BERG by at least 3 points in order to demonstrate clinically significant improvement in balance.    Baseline: EVAL= 45/56 Goal status: INITIAL  5.  Pt will decrease TUG to below 14 seconds/decrease in order to demonstrate decreased fall risk. Baseline: 30.72 without device Goal status: INITIAL  6.  Pt will increase 6MWT by at least 180m(3245f in order to demonstrate clinically significant improvement in cardiopulmonary endurance and community ambulation  Baseline: EVAL=450 feet Goal status: INITIAL  ASSESSMENT:  CLINICAL IMPRESSION: *** Patient presents with good motivation for 2nd visit today. Treatment focused on educating and instructing in LE strengthening and balance activities per plan of care. Patient exhibited some delay in response and some mild confusion understanding instructions. Repetitive VC and visual cues required to complete therex correctly He will benefit from review and progress to  improve his overall strength and safety with mobility. Patient will benefit from continued skilled PT services to improve his overall balance and restore independent mobility with decreased risk of falling.   OBJECTIVE IMPAIRMENTS: Abnormal gait, cardiopulmonary status limiting activity, decreased activity tolerance, decreased balance, decreased cognition, decreased coordination, decreased endurance, decreased mobility, difficulty walking, and decreased strength.   ACTIVITY LIMITATIONS: carrying, lifting, bending, standing, squatting, and stairs  PARTICIPATION LIMITATIONS: cleaning, laundry, shopping, community activity, and yard work  PERSONAL FACTORS: 1-2 comorbidities: HTN, DM  are also affecting patient's functional outcome.   REHAB POTENTIAL: Good  CLINICAL DECISION MAKING: Evolving/moderate complexity  EVALUATION COMPLEXITY: Moderate  PLAN:  PT FREQUENCY: 1-2x/week  PT DURATION: 12 weeks  PLANNED INTERVENTIONS: Therapeutic exercises, Therapeutic activity, Neuromuscular re-education, Balance training, Gait training, Patient/Family education, Self Care, Joint mobilization, Stair training, Vestibular training, Canalith repositioning, DME instructions, Dry Needling, Spinal mobilization, and Manual therapy  PLAN FOR NEXT SESSION:  *** Continue with balance training and LE strengthening next visit.    JoGwenlyn SaranPT, DPT Physical Therapist - [facility]01/31/24, 8:40 AM

## 2023-01-18 ENCOUNTER — Ambulatory Visit: Payer: PPO | Attending: Neurology | Admitting: Speech Pathology

## 2023-01-19 NOTE — Telephone Encounter (Signed)
Duplicate msg. PA was approved

## 2023-01-22 ENCOUNTER — Ambulatory Visit: Payer: PPO | Admitting: Speech Pathology

## 2023-01-22 ENCOUNTER — Ambulatory Visit: Payer: PPO

## 2023-01-22 ENCOUNTER — Telehealth: Payer: Self-pay | Admitting: Gastroenterology

## 2023-01-22 NOTE — Telephone Encounter (Signed)
Pts call returned to let him know his message was received to cancel his colonoscopy due to his fall.  Endo has been informed of cancellation.  Pt said he will call back to reschedule when he is feeling better.  Thanks,  [place], [place]

## 2023-01-22 NOTE — Telephone Encounter (Signed)
Patient called stating that he had a bad fall and is in a lot of pain. States he needs to cancel his colonoscopy for tomorrow. States he will call back on a later date to reschedule.

## 2023-01-23 ENCOUNTER — Ambulatory Visit: Admission: RE | Admit: 2023-01-23 | Payer: PPO | Source: Home / Self Care | Admitting: Gastroenterology

## 2023-01-23 ENCOUNTER — Encounter: Admission: RE | Payer: Self-pay | Source: Home / Self Care

## 2023-01-23 SURGERY — COLONOSCOPY WITH PROPOFOL
Anesthesia: General

## 2023-01-24 ENCOUNTER — Ambulatory Visit: Payer: PPO

## 2023-01-24 ENCOUNTER — Ambulatory Visit: Payer: PPO | Admitting: Speech Pathology

## 2023-01-24 DIAGNOSIS — Z23 Encounter for immunization: Secondary | ICD-10-CM | POA: Diagnosis not present

## 2023-01-29 ENCOUNTER — Ambulatory Visit: Payer: PPO

## 2023-01-29 ENCOUNTER — Ambulatory Visit: Payer: PPO | Admitting: Speech Pathology

## 2023-01-31 ENCOUNTER — Ambulatory Visit: Payer: PPO

## 2023-01-31 ENCOUNTER — Ambulatory Visit: Payer: PPO | Admitting: Speech Pathology

## 2023-02-02 ENCOUNTER — Other Ambulatory Visit: Payer: Self-pay | Admitting: Family

## 2023-02-02 ENCOUNTER — Other Ambulatory Visit: Payer: Self-pay | Admitting: Family Medicine

## 2023-02-02 DIAGNOSIS — E1149 Type 2 diabetes mellitus with other diabetic neurological complication: Secondary | ICD-10-CM

## 2023-02-05 ENCOUNTER — Ambulatory Visit: Payer: PPO | Admitting: Speech Pathology

## 2023-02-05 ENCOUNTER — Ambulatory Visit: Payer: PPO

## 2023-02-07 ENCOUNTER — Ambulatory Visit: Payer: PPO

## 2023-02-08 ENCOUNTER — Encounter: Payer: PPO | Admitting: Speech Pathology

## 2023-02-08 ENCOUNTER — Ambulatory Visit: Payer: PPO

## 2023-02-12 ENCOUNTER — Encounter: Payer: PPO | Admitting: Speech Pathology

## 2023-02-12 ENCOUNTER — Ambulatory Visit: Payer: PPO

## 2023-02-13 ENCOUNTER — Other Ambulatory Visit: Payer: Self-pay | Admitting: Family Medicine

## 2023-02-13 DIAGNOSIS — R399 Unspecified symptoms and signs involving the genitourinary system: Secondary | ICD-10-CM

## 2023-02-14 ENCOUNTER — Encounter: Payer: PPO | Admitting: Speech Pathology

## 2023-02-14 ENCOUNTER — Ambulatory Visit: Payer: PPO | Admitting: Physical Therapy

## 2023-02-19 ENCOUNTER — Ambulatory Visit: Payer: PPO

## 2023-02-19 ENCOUNTER — Encounter: Payer: PPO | Admitting: Speech Pathology

## 2023-02-20 ENCOUNTER — Other Ambulatory Visit: Payer: Self-pay | Admitting: Family Medicine

## 2023-02-20 DIAGNOSIS — E1149 Type 2 diabetes mellitus with other diabetic neurological complication: Secondary | ICD-10-CM

## 2023-02-20 NOTE — Telephone Encounter (Signed)
Requested medications are due for refill today.  no  Requested medications are on the active medications list.  yes  Last refill. 02/19/2022 #360 4 rf  Future visit scheduled.   no  Notes to clinic.  Edward King listed as PCP.     Requested Prescriptions  Pending Prescriptions Disp Refills   metFORMIN (GLUCOPHAGE-XR) 500 MG 24 hr tablet [Pharmacy Med Name: METFORMIN HCL ER 500 MG TABLET] 360 tablet 4    Sig: TAKE 2 TABLETS BY MOUTH TWICE A DAY     Endocrinology:  Diabetes - Biguanides Failed - 02/20/2023  1:41 AM      Failed - Valid encounter within last 6 months    Recent Outpatient Visits           8 months ago Cough, unspecified type   [facility] Birdie Sons, MD   9 months ago [place], Donald E, MD   9 months ago Acute cough   [place] T[place] Joe T, FNP   10 months ago Primary hypertension   [place], [place], PA-C   1 year ago Type 2 diabetes mellitus with other neurologic complication, without long-term current use of insulin ([place])   [place], Donald E, MD       Future Appointments             In 1 month [place], Law[place] Bach, [place] at [place], [place] in normal range and within 360 days    Creatinine  Date Value Ref Range Status  08/30/2014 1.10 0.60 - 1.30 mg/dL Final   Creatinine, Ser  Date Value Ref Range Status  12/21/2022 1.20 0.40 - 1.50 mg/dL Final   Creatinine, POC  Date Value Ref Range Status  01/24/2019 n/a mg/dL Final   Creatinine,U  Date Value Ref Range Status  12/21/2022 91.9 mg/dL Final         Passed - HBA1C is between 0 and 7.9 and within 180 days    Hemoglobin A1C  Date Value Ref Range Status  08/30/2014 6.0 4.2 - 6.3 % Final    Comment:    The American Diabetes Association recommends that a primary  goal of therapy should be <7% and that physicians should reevaluate the treatment regimen in patients with HbA1c values consistently >8%.    Hgb A1c MFr Bld  Date Value Ref Range Status  12/21/2022 7.5 (H) 4.6 - 6.5 % Final    Comment:    Glycemic Control Guidelines for People with Diabetes:Non Diabetic:  <6%Goal of Therapy: <7%Additional Action Suggested:  >8%          Passed - eGFR in normal range and within 360 days    EGFR (African American)  Date Value Ref Range Status  08/30/2014 >60  Final   GFR calc Af Amer  Date Value Ref Range Status  08/17/2020 84 >59 mL/min/1.73 Final    Comment:    **[facility] currently reports eGFR in compliance with the current**   recommendations of the [employer]. [facility] will   update reporting as new guidelines are published from the NKF-ASN   Task force.    EGFR (Non-African Amer.)  Date Value Ref Range Status  08/30/2014 >60  Final    Comment:    eGFR values <19m/min/1.73 m2 may be  an indication of chronic kidney disease (CKD). Calculated eGFR is useful in patients with stable renal function. The eGFR calculation will not be reliable in acutely ill patients when serum creatinine is changing rapidly. It is not useful in  patients on dialysis. The eGFR calculation may not be applicable to patients at the low and high extremes of body sizes, pregnant women, and vegetarians.    GFR, Estimated  Date Value Ref Range Status  05/30/2022 >60 >60 mL/min Final    Comment:    (NOTE) Calculated using the CKD-EPI Creatinine Equation (2021)    GFR  Date Value Ref Range Status  12/21/2022 60.52 >60.00 mL/min Final    Comment:    Calculated using the CKD-EPI Creatinine Equation (2021)   eGFR  Date Value Ref Range Status  05/05/2022 58 (L) >59 mL/min/1.73 Final         Passed - B12 Level in normal range and within 720 days    Vitamin B-12  Date Value Ref Range Status  05/05/2022 649 232 - 1,245 pg/mL Final          Passed - CBC within normal limits and completed in the last 12 months    WBC  Date Value Ref Range Status  09/13/2022 7.2 4.0 - 10.5 K/uL Final   RBC  Date Value Ref Range Status  09/13/2022 4.86 4.22 - 5.81 Mil/uL Final   Hemoglobin  Date Value Ref Range Status  09/13/2022 13.6 13.0 - 17.0 g/dL Final  05/05/2022 13.6 13.0 - 17.7 g/dL Final   HCT  Date Value Ref Range Status  09/13/2022 42.2 39.0 - 52.0 % Final   Hematocrit  Date Value Ref Range Status  05/05/2022 41.6 37.5 - 51.0 % Final   MCHC  Date Value Ref Range Status  09/13/2022 32.2 30.0 - 36.0 g/dL Final   [facility]  Date Value Ref Range Status  05/30/2022 26.7 26.0 - 34.0 pg Final   MCV  Date Value Ref Range Status  09/13/2022 86.8 78.0 - 100.0 fl Final  05/05/2022 83 79 - 97 fL Final  08/30/2014 87 80 - 100 fL Final   No results found for: "PLTCOUNTKUC", "LABPLAT", "POCPLA" RDW  Date Value Ref Range Status  09/13/2022 14.5 11.5 - 15.5 % Final  05/05/2022 14.0 11.6 - 15.4 % Final  08/30/2014 15.0 (H) 11.5 - 14.5 % Final

## 2023-02-21 ENCOUNTER — Encounter: Payer: PPO | Admitting: Speech Pathology

## 2023-02-21 ENCOUNTER — Ambulatory Visit: Payer: PPO

## 2023-02-26 ENCOUNTER — Ambulatory Visit: Payer: PPO

## 2023-02-26 ENCOUNTER — Encounter: Payer: PPO | Admitting: Speech Pathology

## 2023-02-28 ENCOUNTER — Ambulatory Visit: Payer: PPO

## 2023-02-28 ENCOUNTER — Encounter: Payer: PPO | Admitting: Speech Pathology

## 2023-03-05 ENCOUNTER — Encounter: Payer: PPO | Admitting: Speech Pathology

## 2023-03-05 ENCOUNTER — Ambulatory Visit: Payer: PPO

## 2023-03-06 ENCOUNTER — Encounter: Payer: Self-pay | Admitting: Family

## 2023-03-06 DIAGNOSIS — H547 Unspecified visual loss: Secondary | ICD-10-CM

## 2023-03-07 ENCOUNTER — Encounter: Payer: PPO | Admitting: Speech Pathology

## 2023-03-07 ENCOUNTER — Ambulatory Visit: Payer: PPO

## 2023-03-08 DIAGNOSIS — F419 Anxiety disorder, unspecified: Secondary | ICD-10-CM | POA: Diagnosis not present

## 2023-03-08 DIAGNOSIS — F331 Major depressive disorder, recurrent, moderate: Secondary | ICD-10-CM | POA: Diagnosis not present

## 2023-03-09 DIAGNOSIS — M1711 Unilateral primary osteoarthritis, right knee: Secondary | ICD-10-CM | POA: Diagnosis not present

## 2023-03-09 DIAGNOSIS — M1712 Unilateral primary osteoarthritis, left knee: Secondary | ICD-10-CM | POA: Diagnosis not present

## 2023-03-09 DIAGNOSIS — E119 Type 2 diabetes mellitus without complications: Secondary | ICD-10-CM | POA: Diagnosis not present

## 2023-03-12 ENCOUNTER — Encounter: Payer: PPO | Admitting: Speech Pathology

## 2023-03-12 ENCOUNTER — Ambulatory Visit: Payer: PPO

## 2023-03-13 ENCOUNTER — Encounter: Payer: Self-pay | Admitting: *Deleted

## 2023-03-13 DIAGNOSIS — R609 Edema, unspecified: Secondary | ICD-10-CM | POA: Diagnosis not present

## 2023-03-13 DIAGNOSIS — E119 Type 2 diabetes mellitus without complications: Secondary | ICD-10-CM | POA: Diagnosis not present

## 2023-03-13 DIAGNOSIS — I2089 Other forms of angina pectoris: Secondary | ICD-10-CM | POA: Diagnosis not present

## 2023-03-13 DIAGNOSIS — G4733 Obstructive sleep apnea (adult) (pediatric): Secondary | ICD-10-CM | POA: Diagnosis not present

## 2023-03-13 DIAGNOSIS — I639 Cerebral infarction, unspecified: Secondary | ICD-10-CM | POA: Diagnosis not present

## 2023-03-13 DIAGNOSIS — R42 Dizziness and giddiness: Secondary | ICD-10-CM | POA: Diagnosis not present

## 2023-03-13 DIAGNOSIS — Z6841 Body Mass Index (BMI) 40.0 and over, adult: Secondary | ICD-10-CM | POA: Diagnosis not present

## 2023-03-13 DIAGNOSIS — I10 Essential (primary) hypertension: Secondary | ICD-10-CM | POA: Diagnosis not present

## 2023-03-13 DIAGNOSIS — G4730 Sleep apnea, unspecified: Secondary | ICD-10-CM | POA: Diagnosis not present

## 2023-03-13 DIAGNOSIS — M316 Other giant cell arteritis: Secondary | ICD-10-CM | POA: Diagnosis not present

## 2023-03-13 DIAGNOSIS — R0602 Shortness of breath: Secondary | ICD-10-CM | POA: Diagnosis not present

## 2023-03-14 ENCOUNTER — Ambulatory Visit: Payer: PPO

## 2023-03-14 ENCOUNTER — Other Ambulatory Visit (INDEPENDENT_AMBULATORY_CARE_PROVIDER_SITE_OTHER): Payer: Self-pay | Admitting: Vascular Surgery

## 2023-03-14 ENCOUNTER — Encounter: Payer: PPO | Admitting: Speech Pathology

## 2023-03-14 DIAGNOSIS — R0989 Other specified symptoms and signs involving the circulatory and respiratory systems: Secondary | ICD-10-CM

## 2023-03-19 ENCOUNTER — Encounter (INDEPENDENT_AMBULATORY_CARE_PROVIDER_SITE_OTHER): Payer: Self-pay

## 2023-03-19 ENCOUNTER — Ambulatory Visit: Payer: PPO

## 2023-03-19 ENCOUNTER — Ambulatory Visit (INDEPENDENT_AMBULATORY_CARE_PROVIDER_SITE_OTHER): Payer: Self-pay | Admitting: Vascular Surgery

## 2023-03-19 ENCOUNTER — Encounter: Payer: PPO | Admitting: Speech Pathology

## 2023-03-20 ENCOUNTER — Encounter: Payer: Self-pay | Admitting: Family

## 2023-03-20 NOTE — Telephone Encounter (Signed)
Please see fax number provided by the patient. Thanks

## 2023-03-21 ENCOUNTER — Encounter: Payer: PPO | Admitting: Speech Pathology

## 2023-03-21 ENCOUNTER — Ambulatory Visit: Payer: PPO

## 2023-03-21 DIAGNOSIS — I739 Peripheral vascular disease, unspecified: Secondary | ICD-10-CM | POA: Insufficient documentation

## 2023-03-21 DIAGNOSIS — I890 Lymphedema, not elsewhere classified: Secondary | ICD-10-CM | POA: Insufficient documentation

## 2023-03-21 NOTE — Progress Notes (Signed)
MRN : [id number]  Edward King is a 72 y.o. (1951/01/28) male who presents with chief complaint of check circulation.  History of Present Illness:   The patient is seen for evaluation of painful lower extremities. Patient notes the pain is variable and not always associated with activity.  The pain is somewhat consistent day to day occurring on most days. The patient notes the pain also occurs with standing and routinely seems worse as the day wears on. The pain has been progressive over the past several years. The patient states these symptoms are causing  a negative impact on quality of life and daily activities which was a factor in the referral.  The patient has a  history of back problems and DJD of the lumbar and sacral spine.   The patient denies rest pain or dangling of an extremity off the side of the bed during the night for relief. No open wounds or sores at this time. No history of DVT or phlebitis. No prior vascular interventions or surgeries.  ABIs are normal bilaterally with triphasic Doppler signals in the AT and PT bilaterally.  Toe tracings are normal.   No outpatient medications have been marked as taking for the 03/22/23 encounter (Appointment) with Gilda Crease, Latina Craver, MD.    Past Medical History:  Diagnosis Date   Arthritis    Cat bite 07/31/2018   Cellulitis of right leg without foot    Colitis    Depression    Diabetes mellitus without complication (HCC)    Giant cell arteritis (HCC) 06/15/2015   History of chicken pox    History of measles    History of mumps    Hypertension    Lymph edema    both legs.  mostly left   Neuromuscular disorder (HCC)    Prostate CA (HCC) 2016   Radiation seed implants.   Stroke [facility])    Temporal arteritis (HCC)    Clinically dx Dr. Gavin Potters. Negative temporal artrtery bx   Wears hearing aid in both ears     Past Surgical History:  Procedure Laterality Date   ABDOMINOPLASTY     Carotid Doppler  Ultrasound  08/22/2014   [facility]; Minimal plaque right. Minimal thisckening left Anterograde flow vertebrals   CATARACT EXTRACTION W/PHACO Right 07/03/2022   Procedure: CATARACT EXTRACTION PHACO AND INTRAOCULAR LENS PLACEMENT (IOC) RIGHT;  Surgeon: Nevada Crane, MD;  Location: [facility];  Service: Ophthalmology;  Laterality: Right;  Diabetic 2.36 00:18.8   CATARACT EXTRACTION W/PHACO Left 07/17/2022   Procedure: CATARACT EXTRACTION PHACO AND INTRAOCULAR LENS PLACEMENT (IOC) LEFT DIABETIC;  Surgeon: Nevada Crane, MD;  Location: [facility];  Service: Ophthalmology;  Laterality: Left;  Diabetic 3.06 00:23.1   COLONOSCOPY WITH PROPOFOL N/A 02/13/2018   Procedure: COLONOSCOPY WITH PROPOFOL;  Surgeon: Scot Jun, MD;  Location: [facility] ENDOSCOPY;  Service: Endoscopy;  Laterality: N/A;   CT of the head  08/22/2014   [facility]. Normal   DOPPLER ECHOCARDIOGRAPHY  08/29/2014   EF=60-65%. Normal LVEF   Myocardial Perfusion Scan  09/10/2012   Poor exercsie tolerance, but no evidence of stress induced myocardial ischemia. EF=66%. Dr. Juliann Pares   NASAL SEPTOPLASTY W/ TURBINOPLASTY Bilateral 02/10/2016   Procedure: NASAL SEPTOPLASTY WITH INFERIOR TURBINATE REDUCTION;  Surgeon: Bud Face, MD;  Location: [facility] ORS;  Service: ENT;  Laterality: Bilateral;   pannectomy     TEMPORAL ARTERY BIOPSY / LIGATION Left  Social History Social History   Tobacco Use   Smoking status: Never   Smokeless tobacco: Never  Vaping Use   Vaping Use: Never used  Substance Use Topics   Alcohol use: Yes    Alcohol/week: 0.0 standard drinks of alcohol    Comment: Rare - 1 drink   Drug use: No    Family History Family History  Problem Relation Age of Onset   Colon cancer Mother    Coronary artery disease Mother    Prostate cancer Father    Kidney disease Father    Dementia Brother    Diabetes Neg Hx     No Known Allergies   REVIEW OF SYSTEMS (Negative unless  checked)  Constitutional: Weight loss  Fever  Chills Cardiac: Chest pain   Chest pressure   Palpitations   Shortness of breath when laying flat   Shortness of breath with exertion. Vascular:  Pain in legs with walking   Pain in legs at rest  History of DVT   Phlebitis   Swelling in legs   Varicose veins   Non-healing ulcers Pulmonary:   Uses home oxygen   Productive cough   Hemoptysis   Wheeze  COPD   Asthma Neurologic:  Dizziness   Seizures   History of stroke   History of TIA  Aphasia   Vissual changes   Weakness or numbness in arm   Weakness or numbness in leg Musculoskeletal:   Joint swelling   Joint pain   Low back pain Hematologic:  Easy bruising  Easy bleeding   Hypercoagulable state   Anemic Gastrointestinal:  Diarrhea   Vomiting  Gastroesophageal reflux/heartburn   Difficulty swallowing. Genitourinary:  Chronic kidney disease   Difficult urination  Frequent urination   Blood in urine Skin:  Rashes   Ulcers  Psychological:  History of anxiety    History of major depression.  Physical Examination  There were no vitals filed for this visit. There is no height or weight on file to calculate BMI. Gen: WD/WN, NAD Head: [place]/AT, No temporalis wasting.  Ear/Nose/Throat: Hearing grossly intact, nares w/o erythema or drainage Eyes: PER, EOMI, sclera nonicteric.  Neck: Supple, no masses.  No bruit or JVD.  Pulmonary:  Good air movement, no audible wheezing, no use of accessory muscles.  Cardiac: RRR, normal S1, S2, no Murmurs. Vascular:  mild trophic changes, no open wounds Vessel Right Left  Radial Palpable Palpable  PT Palpable Palpable  DP  Palpable Palpable  Gastrointestinal: soft, non-distended. No guarding/no peritoneal signs.  Musculoskeletal: M/S 5/5 throughout.  No visible deformity.  Neurologic: CN 2-12 intact. Pain and light touch intact in extremities.  Symmetrical.  Speech is  fluent. Motor exam as listed above. Psychiatric: Judgment intact, Mood & affect appropriate for pt's clinical situation. Dermatologic: No rashes or ulcers noted.  No changes consistent with cellulitis.   CBC Lab Results  Component Value Date   WBC 7.2 09/13/2022   HGB 13.6 09/13/2022   HCT 42.2 09/13/2022   MCV 86.8 09/13/2022   PLT 227.0 09/13/2022    BMET    Component Value Date/Time   NA 140 12/21/2022 1210   NA 143 05/05/2022 1529   NA 132 (L) 08/30/2014 0450   K 4.4 12/21/2022 1210   K 4.5 08/30/2014 0450   CL 100 12/21/2022 1210   CL 99 08/30/2014 0450   CO2 34 (H) 12/21/2022 1210   CO2 27 08/30/2014 0450   GLUCOSE 156 (H) 12/21/2022 1210   GLUCOSE 196 (  H) 08/30/2014 0450   BUN 21 12/21/2022 1210   BUN 18 05/05/2022 1529   BUN 20 (H) 08/30/2014 0450   CREATININE 1.20 12/21/2022 1210   CREATININE 1.10 08/30/2014 0450   CALCIUM 9.5 12/21/2022 1210   CALCIUM 9.0 08/30/2014 0450   GFRNONAA >60 05/30/2022 1520   GFRNONAA >60 08/30/2014 0450   GFRAA 84 08/17/2020 1434   GFRAA >60 08/30/2014 0450   CrCl cannot be calculated (Patient's most recent lab result is older than the maximum 21 days allowed.).  COAG Lab Results  Component Value Date   INR 1.0 09/09/2019   INR 1.02 07/31/2018    Radiology No results found.   Assessment/Plan 1. PAD (peripheral artery disease) Recommend:  I do not find evidence of life style limiting vascular disease. The patient specifically denies life style limitation.  Previous noninvasive studies including ABI's of the legs do not identify critical vascular problems.  The patient should continue walking and begin a more formal exercise program. The patient should continue his antiplatelet therapy and aggressive treatment of the lipid abnormalities.  The patient is instructed to call the office if there is a significant change in the lower extremity symptoms, particularly if a wound develops or there is an abrupt increase in  leg pain.   A total of 50 minutes was spent with this patient and greater than 50% was spent in counseling and coordination of care with the patient.  Discussion included the treatment options for vascular disease including indications for surgery and intervention.  Also discussed is the appropriate timing of treatment.  In addition medical therapy was discussed.  Patient will follow-up with me on a PRN basis  2. Lymphedema Recommend:  No surgery or intervention at this point in time.  I have reviewed my discussion with the patient regarding venous insufficiency and why it causes symptoms. I have discussed with the patient the chronic skin changes that accompany venous insufficiency and the long term sequela such as ulceration. Patient will contnue wearing graduated compression stockings on a daily basis, as this has provided excellent control of his edema. The patient will put the stockings on first thing in the morning and removing them in the evening. The patient is reminded not to sleep in the stockings.  In addition, behavioral modification including elevation during the day will be initiated. Exercise is strongly encouraged.  Previous duplex ultrasound of the lower extremities shows normal deep system, no significant superficial reflux was identified.  Given the patient's good control and lack of any problems regarding the venous insufficiency and lymphedema a lymph pump in not need at this time.    The patient will follow up with me PRN should anything change.  The patient voices agreement with this plan.  3. Atherosclerosis of native coronary artery of native heart without angina pectoris Continue cardiac and antihypertensive medications as already ordered and reviewed, no changes at this time.  Continue statin as ordered and reviewed, no changes at this time  Nitrates PRN for chest pain  4. Hypertension associated with diabetes Continue antihypertensive medications as already  ordered, these medications have been reviewed and there are no changes at this time.  5. Type 2 diabetes mellitus without complication, unspecified whether long term insulin use Continue hypoglycemic medications as already ordered, these medications have been reviewed and there are no changes at this time.  Hgb A1C to be monitored as already arranged by primary service  6. Decreased pulses in feet See #1 - VAS [place] ABI  WITH/WO TBI    Levora Dredge, MD  03/21/2023 9:08 AM

## 2023-03-22 ENCOUNTER — Encounter (INDEPENDENT_AMBULATORY_CARE_PROVIDER_SITE_OTHER): Payer: Self-pay | Admitting: Vascular Surgery

## 2023-03-22 ENCOUNTER — Other Ambulatory Visit: Payer: Self-pay | Admitting: Family Medicine

## 2023-03-22 ENCOUNTER — Ambulatory Visit (INDEPENDENT_AMBULATORY_CARE_PROVIDER_SITE_OTHER): Payer: PPO | Admitting: Vascular Surgery

## 2023-03-22 ENCOUNTER — Ambulatory Visit (INDEPENDENT_AMBULATORY_CARE_PROVIDER_SITE_OTHER): Payer: PPO

## 2023-03-22 ENCOUNTER — Ambulatory Visit: Payer: PPO | Admitting: Family

## 2023-03-22 VITALS — BP 143/83 | HR 69 | Resp 16 | Wt 269.0 lb

## 2023-03-22 DIAGNOSIS — I890 Lymphedema, not elsewhere classified: Secondary | ICD-10-CM

## 2023-03-22 DIAGNOSIS — E119 Type 2 diabetes mellitus without complications: Secondary | ICD-10-CM

## 2023-03-22 DIAGNOSIS — I739 Peripheral vascular disease, unspecified: Secondary | ICD-10-CM | POA: Diagnosis not present

## 2023-03-22 DIAGNOSIS — R0989 Other specified symptoms and signs involving the circulatory and respiratory systems: Secondary | ICD-10-CM | POA: Diagnosis not present

## 2023-03-22 DIAGNOSIS — E1149 Type 2 diabetes mellitus with other diabetic neurological complication: Secondary | ICD-10-CM

## 2023-03-22 DIAGNOSIS — I2510 Atherosclerotic heart disease of native coronary artery without angina pectoris: Secondary | ICD-10-CM | POA: Diagnosis not present

## 2023-03-22 DIAGNOSIS — E1159 Type 2 diabetes mellitus with other circulatory complications: Secondary | ICD-10-CM | POA: Diagnosis not present

## 2023-03-22 DIAGNOSIS — R399 Unspecified symptoms and signs involving the genitourinary system: Secondary | ICD-10-CM

## 2023-03-22 DIAGNOSIS — I152 Hypertension secondary to endocrine disorders: Secondary | ICD-10-CM | POA: Diagnosis not present

## 2023-03-23 ENCOUNTER — Encounter (INDEPENDENT_AMBULATORY_CARE_PROVIDER_SITE_OTHER): Payer: Self-pay | Admitting: Vascular Surgery

## 2023-03-23 NOTE — Telephone Encounter (Signed)
See other message. This has been refaxed and patient notified.

## 2023-03-26 ENCOUNTER — Ambulatory Visit: Payer: PPO

## 2023-03-26 ENCOUNTER — Encounter: Payer: PPO | Admitting: Speech Pathology

## 2023-03-27 ENCOUNTER — Ambulatory Visit (INDEPENDENT_AMBULATORY_CARE_PROVIDER_SITE_OTHER): Payer: PPO | Admitting: Family

## 2023-03-27 VITALS — BP 138/82 | HR 74 | Temp 97.9°F | Ht 71.0 in | Wt 272.8 lb

## 2023-03-27 DIAGNOSIS — E1149 Type 2 diabetes mellitus with other diabetic neurological complication: Secondary | ICD-10-CM | POA: Diagnosis not present

## 2023-03-27 DIAGNOSIS — R350 Frequency of micturition: Secondary | ICD-10-CM

## 2023-03-27 DIAGNOSIS — R42 Dizziness and giddiness: Secondary | ICD-10-CM | POA: Diagnosis not present

## 2023-03-27 DIAGNOSIS — N481 Balanitis: Secondary | ICD-10-CM | POA: Insufficient documentation

## 2023-03-27 DIAGNOSIS — Z8546 Personal history of malignant neoplasm of prostate: Secondary | ICD-10-CM

## 2023-03-27 LAB — URINALYSIS, ROUTINE W REFLEX MICROSCOPIC
Bilirubin Urine: NEGATIVE
Hgb urine dipstick: NEGATIVE
Ketones, ur: NEGATIVE
Leukocytes,Ua: NEGATIVE
Nitrite: NEGATIVE
RBC / HPF: NONE SEEN (ref 0–?)
Specific Gravity, Urine: 1.01 (ref 1.000–1.030)
Total Protein, Urine: NEGATIVE
Urine Glucose: >=1000 — AB
Urobilinogen, UA: 0.2 (ref 0.0–1.0)
pH: 6.5 (ref 5.0–8.0)

## 2023-03-27 LAB — POCT GLYCOSYLATED HEMOGLOBIN (HGB A1C): Hemoglobin A1C: 6.9 % — AB (ref 4.0–5.6)

## 2023-03-27 LAB — GLUCOSE, POCT (MANUAL RESULT ENTRY): POC Glucose: 142 mg/dl — AB (ref 70–99)

## 2023-03-27 MED ORDER — TRULICITY 1.5 MG/0.5ML ~~LOC~~ SOAJ
1.5000 mg | SUBCUTANEOUS | 2 refills | Status: DC
Start: 2023-03-27 — End: 2023-06-21

## 2023-03-27 MED ORDER — CLOTRIMAZOLE 1 % EX OINT
1.0000 | TOPICAL_OINTMENT | Freq: Two times a day (BID) | CUTANEOUS | 0 refills | Status: DC
Start: 2023-03-27 — End: 2023-03-28

## 2023-03-27 NOTE — Progress Notes (Signed)
Established Patient Office Visit  Subjective:      CC:  Chief Complaint  Patient presents with   Medical Management of Chronic Issues    HPI: Edward King is a 72 y.o. male presenting on 03/27/2023 for Medical Management of Chronic Issues . CVA: recently seen by cardiologist 03/13/23 , was seen for worsening vertigo lightheadedness and low blood pressure. He was advised to decrease blood pressure medication, valsartan hctz decreased in half to 40/6.25 mg. He states today still very dizzy and light headed.  Today 138/82.   DM2: last visit A1c was increased, started on jardiance and ongoing Trulicity.  Wt Readings from Last 3 Encounters:  03/27/23 272 lb 12.8 oz (123.7 kg)  03/22/23 269 lb (122 kg)  12/21/22 287 lb (130.2 kg)   Lab Results  Component Value Date   HGBA1C 6.9 (A) 03/27/2023   Urinary frequency, seems to be increasing and anytime he swallows water he pees. He does c/o of some soreness on the tip of his penis, not really itchy. He does see some redness. He is uncircumcised, and on jardiance. He tries to pull the hood back and wipe off after peeing but still not seeming to help.   Unstable gait: did have to stop physical therapy he states his knee was too weak. He does walk with a cane and also a walker. He states he doesn't want to return to physical therapy just yet.   Social history:  Relevant past medical, surgical, family and social history reviewed and updated as indicated. Interim medical history since our last visit reviewed.  Allergies and medications reviewed and updated.  DATA REVIEWED: CHART IN EPIC     ROS: Negative unless specifically indicated above in HPI.    Current Outpatient Medications:    aspirin EC 81 MG tablet, Take 1 tablet (81 mg total) by mouth daily. Swallow whole., Disp: 90 tablet, Rfl: 1   buPROPion (WELLBUTRIN SR) 150 MG 12 hr tablet, Take 150 mg by mouth daily., Disp: , Rfl:    clonazePAM (KLONOPIN) 0.5 MG tablet, Take 0.5  mg by mouth 3 (three) times daily as needed for anxiety., Disp: , Rfl:    Clotrimazole 1 % OINT, Apply 1 Application topically 2 (two) times daily for 14 days., Disp: 56.7 g, Rfl: 0   Coenzyme Q10 (COQ-10) 100 MG CAPS, Take 100 mg by mouth daily., Disp: , Rfl:    Cyanocobalamin (VITAMIN B-12 PO), Take 1,000 mcg by mouth daily at 6 (six) AM., Disp: , Rfl:    Dulaglutide (TRULICITY) 1.5 MG/0.5ML SOPN, Inject 1.5 mg into the skin once a week., Disp: 2 mL, Rfl: 2   gabapentin (NEURONTIN) 300 MG capsule, TAKE 1 BY MOUTH EVERY DAY IN THE MORNING, 1 AT MID DAY, THEN 2 NIGHTLY, Disp: 120 capsule, Rfl: 2   glucose blood (CONTOUR NEXT TEST) test strip, Use as instructed to check blood sugar daily, Disp: 100 each, Rfl: 4   mesalamine (APRISO) 0.375 g 24 hr capsule, Take 375 mg by mouth daily., Disp: , Rfl:    metFORMIN (GLUCOPHAGE-XR) 500 MG 24 hr tablet, TAKE 2 TABLETS BY MOUTH TWICE A DAY, Disp: 360 tablet, Rfl: 4   mirtazapine (REMERON) 30 MG tablet, Take 30 mg by mouth at bedtime., Disp: , Rfl:    pravastatin (PRAVACHOL) 40 MG tablet, TAKE 1 TABLET (40 MG TOTAL) BY MOUTH DAILY WITH LUNCH., Disp: 90 tablet, Rfl: 3   valsartan-hydrochlorothiazide (DIOVAN-HCT) 80-12.5 MG tablet, TAKE 1 TABLET BY MOUTH EVERY DAY,  Disp: 90 tablet, Rfl: 1   venlafaxine XR (EFFEXOR-XR) 150 MG 24 hr capsule, Take 150 mg by mouth daily with breakfast., Disp: , Rfl:    Vitamin D, Cholecalciferol, 1000 units TABS, Take 2,000 Units by mouth daily., Disp: , Rfl:       Objective:    BP 138/82   Pulse 74   Temp 97.9 F (36.6 C) (Temporal)   Ht 5\' 11"  (1.803 m)   Wt 272 lb 12.8 oz (123.7 kg)   SpO2 98%   BMI 38.05 kg/m   Wt Readings from Last 3 Encounters:  03/27/23 272 lb 12.8 oz (123.7 kg)  03/22/23 269 lb (122 kg)  12/21/22 287 lb (130.2 kg)    Physical Exam Constitutional:      General: He is not in acute distress.    Appearance: Normal appearance. He is normal weight. He is not ill-appearing, toxic-appearing or  diaphoretic.  Cardiovascular:     Rate and Rhythm: Normal rate and regular rhythm.  Pulmonary:     Effort: Pulmonary effort is normal.     Breath sounds: Normal breath sounds.  Musculoskeletal:        General: Normal range of motion.  Neurological:     General: No focal deficit present.     Mental Status: He is alert and oriented to person, place, and time. Mental status is at baseline.     Motor: Motor function is intact.     Coordination: Coordination is intact.     Gait: Gait abnormal (weakness with use of cane).  Psychiatric:        Mood and Affect: Mood normal.        Behavior: Behavior normal.        Thought Content: Thought content normal.        Judgment: Judgment normal.           Assessment & Plan:  Balanitis Assessment & Plan: Rx clotrimazole cream. Will d/c jardiance at this time as likely the contributor.  Will r/o UTI with urinary frequency   Orders: -     Clotrimazole; Apply 1 Application topically 2 (two) times daily for 14 days.  Dispense: 56.7 g; Refill: 0  Dizziness Assessment & Plan: Glucose today in office, not hypoglycemia.  Blood pressure stable at this time.  Ongoing, pt to continue f/u with cardiologist.  Blood pressure at good level today, not likely cause for dizziness.    Orders: -     POCT glycosylated hemoglobin (Hb A1C) -     POCT glucose (manual entry)  Type 2 diabetes mellitus with other neurologic complication, without long-term current use of insulin Assessment & Plan: Balanitis suspected to be r/t jardiance, will dc and increase trulicity again to see if can better tolerate.  A1c today improved with addition of jardiance.  Fasting glucose ok in am, not expected to be cause of dizziness.  Orders: -     POCT glycosylated hemoglobin (Hb A1C) -     Trulicity; Inject 1.5 mg into the skin once a week.  Dispense: 2 mL; Refill: 2  Urinary frequency -     Urinalysis, Routine w reflex microscopic  History of prostate cancer      Return in about 3 months (around 06/26/2023) for f/u diabetes.  Mort Sawyers, MSN, APRN, FNP-C [facility]

## 2023-03-27 NOTE — Patient Instructions (Addendum)
Stop jardiance as I feel this is the contributor to your problems with yeast.   Increase trulicity to 1.5 mg weekly from 0.75 mg .   If cream clotriamazole 0.1% is expensive, try over the counter cream.    Regards,   Mort Sawyers FNP-C

## 2023-03-27 NOTE — Assessment & Plan Note (Addendum)
Rx clotrimazole cream. Will d/c jardiance at this time as likely the contributor.  Will r/o UTI with urinary frequency

## 2023-03-27 NOTE — Progress Notes (Signed)
D/w pt in office see note

## 2023-03-27 NOTE — Assessment & Plan Note (Addendum)
Glucose today in office, not hypoglycemia.  Blood pressure stable at this time.  Ongoing, pt to continue f/u with cardiologist.  Blood pressure at good level today, not likely cause for dizziness.

## 2023-03-27 NOTE — Assessment & Plan Note (Signed)
Balanitis suspected to be r/t jardiance, will dc and increase trulicity again to see if can better tolerate.  A1c today improved with addition of jardiance.  Fasting glucose ok in am, not expected to be cause of dizziness.

## 2023-03-28 ENCOUNTER — Ambulatory Visit: Payer: PPO

## 2023-03-28 ENCOUNTER — Encounter: Payer: PPO | Admitting: Speech Pathology

## 2023-03-28 ENCOUNTER — Telehealth: Payer: Self-pay | Admitting: Family

## 2023-03-28 ENCOUNTER — Other Ambulatory Visit: Payer: Self-pay | Admitting: Family

## 2023-03-28 ENCOUNTER — Encounter: Payer: Self-pay | Admitting: Family

## 2023-03-28 ENCOUNTER — Other Ambulatory Visit: Payer: Self-pay

## 2023-03-28 DIAGNOSIS — N481 Balanitis: Secondary | ICD-10-CM

## 2023-03-28 MED ORDER — CLOTRIMAZOLE 1 % EX OINT
1.0000 | TOPICAL_OINTMENT | Freq: Two times a day (BID) | CUTANEOUS | 0 refills | Status: DC
Start: 2023-03-28 — End: 2023-03-29

## 2023-03-28 NOTE — Telephone Encounter (Signed)
Patient called the office again regarding medications sent to the pharmacy for him, was able to pick up trulicity but not able to pick up cream Clotrimazole 1 % OINT

## 2023-03-28 NOTE — Telephone Encounter (Signed)
error 

## 2023-03-28 NOTE — Telephone Encounter (Signed)
Phone call from [facility] did not receive the refill for this medication.  Resending.

## 2023-03-28 NOTE — Telephone Encounter (Signed)
Patient called in and stated that [facility] stated they didn't receive the Clotrimazole 1 % OINT and Dulaglutide (TRULICITY) 1.5 MG/0.5ML SOPN. Informed patient that the pharmacy did confirm the prescriptions but they are stating they never received them. He was wanting to know if these could be resent over. Please advise. Thank you!

## 2023-03-29 NOTE — Telephone Encounter (Signed)
Reviewed chart both have been called in. No further action needed at this time.

## 2023-04-03 ENCOUNTER — Encounter: Payer: PPO | Admitting: Urology

## 2023-04-06 ENCOUNTER — Telehealth: Payer: Self-pay | Admitting: Family

## 2023-04-06 NOTE — Telephone Encounter (Signed)
Kayla from [facility] called stating they received the referral for patient,however she is needing the most recent eye reports on patient,including any eye diagnosis.  Fax number:[phone]

## 2023-04-06 NOTE — Telephone Encounter (Signed)
Edward King, not sure who handles this kind of request?

## 2023-04-09 NOTE — Telephone Encounter (Signed)
I refaxed the referral and advised that this was by patient request for problems with his eyesight. No recent eye reports.

## 2023-04-19 DIAGNOSIS — H47011 Ischemic optic neuropathy, right eye: Secondary | ICD-10-CM | POA: Diagnosis not present

## 2023-04-19 DIAGNOSIS — H34232 Retinal artery branch occlusion, left eye: Secondary | ICD-10-CM | POA: Diagnosis not present

## 2023-04-19 DIAGNOSIS — H54511A Low vision right eye category 1, normal vision left eye: Secondary | ICD-10-CM | POA: Diagnosis not present

## 2023-04-19 DIAGNOSIS — H3530 Unspecified macular degeneration: Secondary | ICD-10-CM | POA: Diagnosis not present

## 2023-04-19 DIAGNOSIS — M316 Other giant cell arteritis: Secondary | ICD-10-CM | POA: Diagnosis not present

## 2023-04-24 ENCOUNTER — Telehealth: Payer: Self-pay | Admitting: Family

## 2023-04-24 NOTE — Telephone Encounter (Signed)
Called patient to schedule Medicare Annual Wellness Visit (AWV). Left message for patient to call back and schedule Medicare Annual Wellness Visit (AWV).  Last date of AWV: 05/08/2022  Please schedule an appointment at any time with [facility].  If any questions, please contact me at [phone].  Thank you ,  Randon Goldsmith Care Guide [facility] AWV TEAM Direct Dial: [phone]

## 2023-04-28 ENCOUNTER — Other Ambulatory Visit: Payer: Self-pay | Admitting: Family Medicine

## 2023-05-07 ENCOUNTER — Other Ambulatory Visit: Payer: Self-pay

## 2023-05-07 ENCOUNTER — Telehealth: Payer: Self-pay

## 2023-05-07 DIAGNOSIS — Z1211 Encounter for screening for malignant neoplasm of colon: Secondary | ICD-10-CM

## 2023-05-07 NOTE — Telephone Encounter (Signed)
Patient returned phone call.  Colonoscopy has been scheduled for Tuesday 05/22/23 with Dr. Tobi Bastos.  Referral updated.  Instructions updated. Patient has his rx-I've asked him to check his expiration date when he gets home and let me know if he needs a new rx sent.

## 2023-05-07 NOTE — Telephone Encounter (Signed)
Returned patients call to schedule his colonoscopy that was previously canceled due to his fall. LVM for pt to return my call to reschedule with Dr. Tobi Bastos at [facility].  Thanks,  [place], [place]

## 2023-05-07 NOTE — Telephone Encounter (Signed)
Pt lvmm about rescheduling colonoscopy please return call

## 2023-05-10 ENCOUNTER — Telehealth: Payer: Self-pay

## 2023-05-10 NOTE — Telephone Encounter (Signed)
Medical Clearance Fax received from Dr. Glennis Brink office on 05/09/23.  Patient has been cleared to have his colonoscopy as scheduled with Dr. Tobi Bastos on 05/22/23.  Thanks,  Marcelino Duster CMA

## 2023-05-21 ENCOUNTER — Encounter: Payer: Self-pay | Admitting: Gastroenterology

## 2023-05-22 ENCOUNTER — Ambulatory Visit: Payer: PPO | Admitting: Certified Registered"

## 2023-05-22 ENCOUNTER — Ambulatory Visit
Admission: RE | Admit: 2023-05-22 | Discharge: 2023-05-22 | Disposition: A | Discharge status: Home / Self Care | Payer: PPO | Attending: Gastroenterology | Admitting: Gastroenterology

## 2023-05-22 ENCOUNTER — Encounter
Admission: RE | Disposition: A | Discharge status: Home / Self Care | Payer: Self-pay | Source: Home / Self Care | Attending: Gastroenterology

## 2023-05-22 ENCOUNTER — Encounter: Payer: Self-pay | Admitting: Gastroenterology

## 2023-05-22 DIAGNOSIS — Z6841 Body Mass Index (BMI) 40.0 and over, adult: Secondary | ICD-10-CM | POA: Diagnosis not present

## 2023-05-22 DIAGNOSIS — Z8546 Personal history of malignant neoplasm of prostate: Secondary | ICD-10-CM | POA: Insufficient documentation

## 2023-05-22 DIAGNOSIS — E1151 Type 2 diabetes mellitus with diabetic peripheral angiopathy without gangrene: Secondary | ICD-10-CM | POA: Diagnosis not present

## 2023-05-22 DIAGNOSIS — I2510 Atherosclerotic heart disease of native coronary artery without angina pectoris: Secondary | ICD-10-CM | POA: Diagnosis not present

## 2023-05-22 DIAGNOSIS — Z800 Family history of malignant neoplasm of digestive organs: Secondary | ICD-10-CM | POA: Insufficient documentation

## 2023-05-22 DIAGNOSIS — Z8249 Family history of ischemic heart disease and other diseases of the circulatory system: Secondary | ICD-10-CM | POA: Insufficient documentation

## 2023-05-22 DIAGNOSIS — Z8673 Personal history of transient ischemic attack (TIA), and cerebral infarction without residual deficits: Secondary | ICD-10-CM | POA: Insufficient documentation

## 2023-05-22 DIAGNOSIS — Z1211 Encounter for screening for malignant neoplasm of colon: Secondary | ICD-10-CM | POA: Diagnosis not present

## 2023-05-22 DIAGNOSIS — Z7985 Long-term (current) use of injectable non-insulin antidiabetic drugs: Secondary | ICD-10-CM | POA: Insufficient documentation

## 2023-05-22 DIAGNOSIS — G4730 Sleep apnea, unspecified: Secondary | ICD-10-CM | POA: Diagnosis not present

## 2023-05-22 DIAGNOSIS — Z7984 Long term (current) use of oral hypoglycemic drugs: Secondary | ICD-10-CM | POA: Diagnosis not present

## 2023-05-22 DIAGNOSIS — F32A Depression, unspecified: Secondary | ICD-10-CM | POA: Insufficient documentation

## 2023-05-22 DIAGNOSIS — Z91198 Patient's noncompliance with other medical treatment and regimen for other reason: Secondary | ICD-10-CM | POA: Diagnosis not present

## 2023-05-22 DIAGNOSIS — M1990 Unspecified osteoarthritis, unspecified site: Secondary | ICD-10-CM | POA: Diagnosis not present

## 2023-05-22 DIAGNOSIS — I10 Essential (primary) hypertension: Secondary | ICD-10-CM | POA: Diagnosis not present

## 2023-05-22 HISTORY — PX: COLONOSCOPY WITH PROPOFOL: SHX5780

## 2023-05-22 LAB — GLUCOSE, CAPILLARY: Glucose-Capillary: 153 mg/dL — ABNORMAL HIGH (ref 70–99)

## 2023-05-22 SURGERY — COLONOSCOPY WITH PROPOFOL
Anesthesia: General

## 2023-05-22 MED ORDER — LIDOCAINE HCL (PF) 2 % IJ SOLN
INTRAMUSCULAR | Status: AC
  Filled 2023-05-22: qty 5

## 2023-05-22 MED ORDER — PROPOFOL 10 MG/ML IV BOLUS
INTRAVENOUS | Status: DC | PRN
  Administered 2023-05-22: 100 ug/kg/min via INTRAVENOUS
  Administered 2023-05-22: 110 mg via INTRAVENOUS

## 2023-05-22 MED ORDER — SODIUM CHLORIDE 0.9 % IV SOLN: INTRAVENOUS | Status: DC

## 2023-05-22 MED ORDER — PROPOFOL 10 MG/ML IV BOLUS
INTRAVENOUS | Status: AC
  Filled 2023-05-22: qty 40

## 2023-05-22 MED ORDER — LIDOCAINE HCL (CARDIAC) PF 100 MG/5ML IV SOSY
PREFILLED_SYRINGE | INTRAVENOUS | Status: DC | PRN
  Administered 2023-05-22: 100 mg via INTRAVENOUS

## 2023-05-22 NOTE — Anesthesia Postprocedure Evaluation (Signed)
Anesthesia Post Note  Patient: Edward King  Procedure(s) Performed: COLONOSCOPY WITH PROPOFOL  Patient location during evaluation: Endoscopy Anesthesia Type: General Level of consciousness: awake and alert Pain management: pain level controlled Vital Signs Assessment: post-procedure vital signs reviewed and stable Respiratory status: spontaneous breathing, nonlabored ventilation, respiratory function stable and patient connected to nasal cannula oxygen Cardiovascular status: blood pressure returned to baseline and stable Postop Assessment: no apparent nausea or vomiting Anesthetic complications: no   No notable events documented.   Last Vitals:  Vitals:   05/22/23 1108 05/22/23 1118  BP: (!) 138/93 135/87  Pulse: 84   Resp: 17   Temp:    SpO2: 96%     Last Pain:  Vitals:   05/22/23 1118  TempSrc:   PainSc: 0-No pain                 Corinda Gubler

## 2023-05-22 NOTE — Transfer of Care (Signed)
Immediate Anesthesia Transfer of Care Note  Patient: Edward King  Procedure(s) Performed: COLONOSCOPY WITH PROPOFOL  Patient Location: Endoscopy Unit  Anesthesia Type:General  Level of Consciousness: drowsy  Airway & Oxygen Therapy: Patient Spontanous Breathing  Post-op Assessment: Report given to RN and Post -op Vital signs reviewed and stable  Post vital signs: Reviewed and stable  Last Vitals:  Vitals Value Taken Time  BP 122/68 1058  Temp 35.9 1058  Pulse 78 1058  Resp 16 1058  SpO2 96 1058    Last Pain:  Vitals:   05/22/23 0953  TempSrc: Temporal  PainSc: 0-No pain         Complications: No notable events documented.

## 2023-05-22 NOTE — Anesthesia Preprocedure Evaluation (Signed)
Anesthesia Evaluation  Patient identified by MRN, date of birth, ID band Patient awake    Reviewed: Allergy & Precautions, NPO status , Patient's Chart, lab work & pertinent test results  History of Anesthesia Complications Negative for: history of anesthetic complications  Airway Mallampati: III  TM Distance: >3 FB Neck ROM: Full    Dental no notable dental hx. (+) Teeth Intact   Pulmonary sleep apnea , neg COPD, Patient abstained from smoking.Not current smoker   Pulmonary exam normal breath sounds clear to auscultation       Cardiovascular Exercise Tolerance: Poor METShypertension, Pt. on medications + CAD and + Peripheral Vascular Disease  (-) Past MI Normal cardiovascular exam(-) dysrhythmias  Rhythm:Regular Rate:Normal - Systolic murmurs Unremarkable TTE in 2023   Neuro/Psych  PSYCHIATRIC DISORDERS  Depression    Residual right leg weakness CVA, Residual Symptoms    GI/Hepatic negative GI ROS, Neg liver ROS,neg GERD  ,,  Endo/Other  diabetes, Type 2  Morbid obesity (BMI 42)On GLP1 agonist, last taken over a week ago. Denies GI symptoms today  Renal/GU negative Renal ROS     Musculoskeletal  (+) Arthritis ,    Abdominal  (+) + obese  Peds  Hematology H/o prostate cancer   Anesthesia Other Findings Past Medical History: No date: Arthritis 07/31/2018: Cat bite No date: Cellulitis of right leg without foot No date: Colitis No date: Depression No date: Diabetes mellitus without complication (HCC) 06/15/2015: Giant cell arteritis (HCC) No date: History of chicken pox No date: History of measles No date: History of mumps No date: Hypertension No date: Lymph edema     Comment:  both legs.  mostly left No date: Neuromuscular disorder (HCC) 2016: Prostate CA (HCC)     Comment:  Radiation seed implants. No date: Stroke [facility]) No date: Temporal arteritis (HCC)     Comment:  Clinically dx Dr. Gavin Potters. Negative  temporal artrtery               bx No date: Wears hearing aid in both ears  Reproductive/Obstetrics                              Anesthesia Physical Anesthesia Plan  ASA: 3  Anesthesia Plan: General   Post-op Pain Management: Minimal or no pain anticipated   Induction: Intravenous  PONV Risk Score and Plan: 2 and TIVA, Treatment may vary due to age or medical condition, Ondansetron and Propofol infusion  Airway Management Planned: Nasal Cannula and Natural Airway  Additional Equipment: None  Intra-op Plan:   Post-operative Plan:   Informed Consent: I have reviewed the patients History and Physical, chart, labs and discussed the procedure including the risks, benefits and alternatives for the proposed anesthesia with the patient or authorized representative who has indicated his/her understanding and acceptance.     Dental advisory given  Plan Discussed with: CRNA  Anesthesia Plan Comments: (Discussed risks of anesthesia with patient, including possibility of difficulty with spontaneous ventilation under anesthesia necessitating airway intervention, PONV, and rare risks such as cardiac or respiratory or neurological events, and allergic reactions. Discussed the role of CRNA in patient's perioperative care. Patient understands.)         Anesthesia Quick Evaluation

## 2023-05-22 NOTE — Op Note (Signed)
[facility] Patient Name: Edward King Procedure Date: 05/22/2023 10:35 AM MRN: [id number] Account #: [id number] Date of Birth: December 16, 1951 Admit Type: Outpatient Age: 72 Room: [facility] ENDO ROOM 2 Gender: Male Note Status: Finalized Instrument Name: Prentice Docker [id number] Procedure:             Colonoscopy Indications:           Screening in patient at increased risk: Family history                         of 1st-degree relative with colorectal cancer Providers:             [name] MD, MD Referring MD:          [name] MD, MD (Referring MD), Mort Sawyers                         (Referring MD) Medicines:             Monitored Anesthesia Care Complications:         No immediate complications. Procedure:             Pre-Anesthesia Assessment:                        - Prior to the procedure, a History and Physical was                         performed, and patient medications, allergies and                         sensitivities were reviewed. The patient's tolerance                         of previous anesthesia was reviewed.                        - The risks and benefits of the procedure and the                         sedation options and risks were discussed with the                         patient. All questions were answered and informed                         consent was obtained.                        - ASA Grade Assessment: II - A patient with mild                         systemic disease.                        After obtaining informed consent, the colonoscope was                         passed under direct vision. Throughout the procedure,                         the  patient's blood pressure, pulse, and oxygen                         saturations were monitored continuously. The                         Colonoscope was introduced through the anus with the                         intention of advancing to the cecum. The scope was                          advanced to the ascending colon before the procedure                         was aborted. Medications were given. The colonoscopy                         was performed with ease. The patient tolerated the                         procedure well. The quality of the bowel preparation                         was poor. Findings:      Extensive amounts of semi-solid stool was found in the entire colon. Impression:            - Preparation of the colon was poor.                        - Stool in the entire examined colon.                        - No specimens collected. Recommendation:        - Discharge patient to home (with escort).                        - Resume previous diet.                        - Continue present medications.                        - Repeat colonoscopy in 4 weeks because the bowel                         preparation was suboptimal. Procedure Code(s):     --- Professional ---                        [id number], 53, Colonoscopy, flexible; diagnostic,                         including collection of specimen(s) by brushing or                         washing, when performed (separate procedure) Diagnosis Code(s):     --- Professional ---  Z80.0, Family history of malignant neoplasm of                         digestive organs CPT copyright 2022 American Medical Association. All rights reserved. The codes documented in this report are preliminary and upon coder review may  be revised to meet current compliance requirements. [name], MD [name] MD, MD 05/22/2023 10:55:30 AM This report has been signed electronically. Number of Addenda: 0 Note Initiated On: 05/22/2023 10:35 AM Total Procedure Duration: 0 hours 1 minute 4 seconds  Estimated Blood Loss:  Estimated blood loss: none.      [facility]

## 2023-05-22 NOTE — H&P (Signed)
[name], MD [address], [address], [place], [place], [address] [address], [place], [place], [address] Phone: [phone]  Fax: [phone]  Primary Care Physician:  Mort Sawyers, FNP   Pre-Procedure History & Physical: HPI:  Edward King is a 72 y.o. male is here for an colonoscopy.   Past Medical History:  Diagnosis Date   Arthritis    Cat bite 07/31/2018   Cellulitis of right leg without foot    Colitis    Depression    Diabetes mellitus without complication (HCC)    Giant cell arteritis (HCC) 06/15/2015   History of chicken pox    History of measles    History of mumps    Hypertension    Lymph edema    both legs.  mostly left   Neuromuscular disorder (HCC)    Prostate CA (HCC) 2016   Radiation seed implants.   Stroke [facility])    Temporal arteritis (HCC)    Clinically dx Dr. Gavin Potters. Negative temporal artrtery bx   Wears hearing aid in both ears     Past Surgical History:  Procedure Laterality Date   ABDOMINOPLASTY     Carotid Doppler Ultrasound  08/22/2014   [facility]; Minimal plaque right. Minimal thisckening left Anterograde flow vertebrals   CATARACT EXTRACTION W/PHACO Right 07/03/2022   Procedure: CATARACT EXTRACTION PHACO AND INTRAOCULAR LENS PLACEMENT (IOC) RIGHT;  Surgeon: Nevada Crane, MD;  Location: [facility];  Service: Ophthalmology;  Laterality: Right;  Diabetic 2.36 00:18.8   CATARACT EXTRACTION W/PHACO Left 07/17/2022   Procedure: CATARACT EXTRACTION PHACO AND INTRAOCULAR LENS PLACEMENT (IOC) LEFT DIABETIC;  Surgeon: Nevada Crane, MD;  Location: [facility];  Service: Ophthalmology;  Laterality: Left;  Diabetic 3.06 00:23.1   COLONOSCOPY WITH PROPOFOL N/A 02/13/2018   Procedure: COLONOSCOPY WITH PROPOFOL;  Surgeon: Scot Jun, MD;  Location: [facility] END[facility];  Service: Endoscopy;  Laterality: N/A;   CT of the head  08/22/2014   [facility]. Normal   DOPPLER ECHOCARDIOGRAPHY  08/29/2014    EF=60-65%. Normal LVEF   EYE SURGERY     Myocardial Perfusion Scan  09/10/2012   Poor exercsie tolerance, but no evidence of stress induced myocardial ischemia. EF=66%. Dr. Juliann Pares   NASAL SEPTOPLASTY W/ TURBINOPLASTY Bilateral 02/10/2016   Procedure: NASAL SEPTOPLASTY WITH INFERIOR TURBINATE REDUCTION;  Surgeon: Bud Face, MD;  Location: [facility] ORS;  Service: ENT;  Laterality: Bilateral;   pannectomy     TEMPORAL ARTERY BIOPSY / LIGATION Left     Prior to Admission medications   Medication Sig Start Date End Date Taking? Authorizing Provider  buPROPion (WELLBUTRIN SR) 150 MG 12 hr tablet Take 150 mg by mouth daily.   Yes [provider]  clonazePAM (KLONOPIN) 0.5 MG tablet Take 0.5 mg by mouth 3 (three) times daily as needed for anxiety.   Yes [provider]  gabapentin (NEURONTIN) 300 MG capsule TAKE 1 BY MOUTH EVERY DAY IN THE MORNING, 1 AT MID DAY, THEN 2 NIGHTLY 02/05/23  Yes Dugal, Tabitha, FNP  mesalamine (APRISO) 0.375 g 24 hr capsule Take 375 mg by mouth daily.   Yes [provider]  pravastatin (PRAVACHOL) 40 MG tablet TAKE 1 TABLET (40 MG TOTAL) BY MOUTH DAILY WITH LUNCH. 05/26/22  Yes Malva Limes, MD  valsartan-hydrochlorothiazide (DIOVAN-HCT) 80-12.5 MG tablet TAKE 1 TABLET BY MOUTH EVERY DAY 12/22/22  Yes Mort Sawyers, FNP  venlafaxine XR (EFFEXOR-XR) 150 MG 24 hr capsule Take 150 mg by mouth  daily with breakfast.   Yes [provider]  ALEVAZOL 1 % OINT APPLY 1 APPLICATION TOPICALLY 2 (TWO) TIMES DAILY FOR 14 DAYS. 03/29/23   Mort Sawyers, FNP  aspirin EC 81 MG tablet Take 1 tablet (81 mg total) by mouth daily. Swallow whole. 09/20/22   Mort Sawyers, FNP  Coenzyme Q10 (COQ-10) 100 MG CAPS Take 100 mg by mouth daily.    [provider]  Cyanocobalamin (VITAMIN B-12 PO) Take 1,000 mcg by mouth daily at 6 (six) AM.    [provider]  Dulaglutide (TRULICITY) 1.5 MG/0.5ML SOPN Inject 1.5 mg into the skin once a week.  03/27/23   Mort Sawyers, FNP  glucose blood (CONTOUR NEXT TEST) test strip Use as instructed to check blood sugar daily 10/22/18   Malva Limes, MD  metFORMIN (GLUCOPHAGE-XR) 500 MG 24 hr tablet TAKE 2 TABLETS BY MOUTH TWICE A DAY 02/19/22   Malva Limes, MD  mirtazapine (REMERON) 30 MG tablet Take 30 mg by mouth at bedtime. 10/12/22   [provider]  Vitamin D, Cholecalciferol, 1000 units TABS Take 2,000 Units by mouth daily.    [provider]    Allergies as of 05/07/2023 - Review Complete 03/27/2023  Allergen Reaction Noted   Jardiance [empagliflozin] Other (See Comments) 03/27/2023    Family History  Problem Relation Age of Onset   Colon cancer Mother    Coronary artery disease Mother    Prostate cancer Father    Kidney disease Father    Dementia Brother    Diabetes Neg Hx     Social History   Socioeconomic History   Marital status: Married    Spouse name: Not on file   Number of children: 1   Years of education: Not on file   Highest education level: Some college, no degree  Occupational History   Occupation: Retired    Associate Professor: [employer]    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, 2017  Tobacco Use   Smoking status: Never   Smokeless tobacco: Never  Vaping Use   Vaping Use: Never used  Substance and Sexual Activity   Alcohol use: Yes    Alcohol/week: 0.0 standard drinks of alcohol    Comment: Rare - 1 drink   Drug use: No   Sexual activity: Not on file  Other Topics Concern   Not on file  Social History Narrative   Not on file   Social Determinants of Health   Financial Resource Strain: Low Risk  (03/14/2021)   Overall Financial Resource Strain (CARDIA)    Difficulty of Paying Living Expenses: Not hard at all  Food Insecurity: No Food Insecurity (03/14/2021)   Hunger Vital Sign    Worried About Running Out of Food in the Last Year: Never true    Ran Out of Food in the Last Year: Never true  Transportation Needs: No Transportation  Needs (03/14/2021)   PRAPARE - Administrator, Civil Service (Medical): No    Lack of Transportation (Non-Medical): No  Physical Activity: Insufficiently Active (03/14/2021)   Exercise Vital Sign    Days of Exercise per Week: 1 day    Minutes of Exercise per Session: 10 min  Stress: Stress Concern Present (03/14/2021)   [employer] of Occupational Health - Occupational Stress Questionnaire    Feeling of Stress : To some extent  Social Connections: Moderately Integrated (03/14/2021)   Social Connection and Isolation Panel [NHANES]    Frequency of Communication with Friends and  Family: More than three times a week    Frequency of Social Gatherings with Friends and Family: Three times a week    Attends Religious Services: More than 4 times per year    Active Member of Clubs or Organizations: No    Attends Banker Meetings: Never    Marital Status: Married  Catering manager Violence: Not At Risk (03/14/2021)   Humiliation, Afraid, Rape, and Kick questionnaire    Fear of Current or Ex-Partner: No    Emotionally Abused: No    Physically Abused: No    Sexually Abused: No    Review of Systems: See HPI, otherwise negative ROS  Physical Exam: BP (!) 132/92   Pulse 84   Temp (!) 96.2 F (35.7 C) (Temporal)   Resp 18   Ht 5\' 11"  (1.803 m)   Wt 120.7 kg   SpO2 94%   BMI 37.10 kg/m  General:   Alert,  pleasant and cooperative in NAD Head:  Normocephalic and atraumatic. Neck:  Supple; no masses or thyromegaly. Lungs:  Clear throughout to auscultation, normal respiratory effort.    Heart:  +S1, +S2, Regular rate and rhythm, No edema. Abdomen:  Soft, nontender and nondistended. Normal bowel sounds, without guarding, and without rebound.   Neurologic:  Alert and  oriented x4;  grossly normal neurologically.  Impression/Plan: Edward King is here for an colonoscopy to be performed for Screening colonoscopy , family history of colon cancer Risks, benefits,  limitations, and alternatives regarding  colonoscopy have been reviewed with the patient.  Questions have been answered.  All parties agreeable.   [name], MD  05/22/2023, 10:36 AM

## 2023-05-28 ENCOUNTER — Encounter: Payer: Self-pay | Admitting: Gastroenterology

## 2023-06-01 DIAGNOSIS — F418 Other specified anxiety disorders: Secondary | ICD-10-CM | POA: Diagnosis not present

## 2023-06-01 DIAGNOSIS — F331 Major depressive disorder, recurrent, moderate: Secondary | ICD-10-CM | POA: Diagnosis not present

## 2023-06-03 ENCOUNTER — Other Ambulatory Visit: Payer: Self-pay | Admitting: Family

## 2023-06-03 DIAGNOSIS — E1149 Type 2 diabetes mellitus with other diabetic neurological complication: Secondary | ICD-10-CM

## 2023-06-04 NOTE — Telephone Encounter (Signed)
Pt has been on trulicity and tolerating well. Can we do the p/a

## 2023-06-04 NOTE — Progress Notes (Signed)
No Show

## 2023-06-13 DIAGNOSIS — G4733 Obstructive sleep apnea (adult) (pediatric): Secondary | ICD-10-CM | POA: Diagnosis not present

## 2023-06-13 DIAGNOSIS — I639 Cerebral infarction, unspecified: Secondary | ICD-10-CM | POA: Diagnosis not present

## 2023-06-13 DIAGNOSIS — I10 Essential (primary) hypertension: Secondary | ICD-10-CM | POA: Diagnosis not present

## 2023-06-13 DIAGNOSIS — E119 Type 2 diabetes mellitus without complications: Secondary | ICD-10-CM | POA: Diagnosis not present

## 2023-06-13 DIAGNOSIS — G4730 Sleep apnea, unspecified: Secondary | ICD-10-CM | POA: Diagnosis not present

## 2023-06-13 DIAGNOSIS — R0602 Shortness of breath: Secondary | ICD-10-CM | POA: Diagnosis not present

## 2023-06-13 DIAGNOSIS — I2089 Other forms of angina pectoris: Secondary | ICD-10-CM | POA: Diagnosis not present

## 2023-06-13 DIAGNOSIS — R42 Dizziness and giddiness: Secondary | ICD-10-CM | POA: Diagnosis not present

## 2023-06-13 DIAGNOSIS — Z8719 Personal history of other diseases of the digestive system: Secondary | ICD-10-CM | POA: Diagnosis not present

## 2023-06-13 DIAGNOSIS — E669 Obesity, unspecified: Secondary | ICD-10-CM | POA: Diagnosis not present

## 2023-06-13 DIAGNOSIS — M316 Other giant cell arteritis: Secondary | ICD-10-CM | POA: Diagnosis not present

## 2023-06-18 DIAGNOSIS — F0280 Dementia in other diseases classified elsewhere without behavioral disturbance: Secondary | ICD-10-CM | POA: Diagnosis not present

## 2023-06-18 DIAGNOSIS — Z8673 Personal history of transient ischemic attack (TIA), and cerebral infarction without residual deficits: Secondary | ICD-10-CM | POA: Diagnosis not present

## 2023-06-18 DIAGNOSIS — F0150 Vascular dementia without behavioral disturbance: Secondary | ICD-10-CM | POA: Diagnosis not present

## 2023-06-18 DIAGNOSIS — R2689 Other abnormalities of gait and mobility: Secondary | ICD-10-CM | POA: Diagnosis not present

## 2023-06-18 DIAGNOSIS — R29810 Facial weakness: Secondary | ICD-10-CM | POA: Diagnosis not present

## 2023-06-18 DIAGNOSIS — G309 Alzheimer's disease, unspecified: Secondary | ICD-10-CM | POA: Diagnosis not present

## 2023-06-20 ENCOUNTER — Other Ambulatory Visit (HOSPITAL_COMMUNITY): Payer: Self-pay

## 2023-06-20 ENCOUNTER — Telehealth: Payer: Self-pay

## 2023-06-20 NOTE — Telephone Encounter (Signed)
PA has been approved and documented in separate encounter. 

## 2023-06-20 NOTE — Telephone Encounter (Signed)
Pharmacy Patient Advocate Encounter  Received notification from [employer]/ Rx Advance that Prior Authorization for Trulicity 1.5mg /0.39ml has been APPROVED from 06/20/2023 to 06/19/2024.Marland Kitchen  PA #/Case ID/Reference #: [id number] KEY: [id number]

## 2023-06-21 ENCOUNTER — Other Ambulatory Visit: Payer: Self-pay | Admitting: Family

## 2023-06-21 DIAGNOSIS — E1149 Type 2 diabetes mellitus with other diabetic neurological complication: Secondary | ICD-10-CM

## 2023-06-21 MED ORDER — TRULICITY 1.5 MG/0.5ML ~~LOC~~ SOAJ
1.5000 mg | SUBCUTANEOUS | 2 refills | Status: DC
Start: 2023-06-21 — End: 2023-11-28

## 2023-06-28 ENCOUNTER — Other Ambulatory Visit: Payer: Self-pay | Admitting: Student

## 2023-06-28 DIAGNOSIS — R29810 Facial weakness: Secondary | ICD-10-CM

## 2023-07-02 ENCOUNTER — Encounter: Payer: Self-pay | Admitting: Family

## 2023-07-02 ENCOUNTER — Ambulatory Visit (INDEPENDENT_AMBULATORY_CARE_PROVIDER_SITE_OTHER): Payer: PPO | Admitting: Family

## 2023-07-02 VITALS — BP 136/82 | HR 90 | Ht 71.0 in | Wt 273.0 lb

## 2023-07-02 DIAGNOSIS — E119 Type 2 diabetes mellitus without complications: Secondary | ICD-10-CM

## 2023-07-02 DIAGNOSIS — Z7985 Long-term (current) use of injectable non-insulin antidiabetic drugs: Secondary | ICD-10-CM | POA: Diagnosis not present

## 2023-07-02 DIAGNOSIS — R600 Localized edema: Secondary | ICD-10-CM | POA: Diagnosis not present

## 2023-07-02 DIAGNOSIS — E1149 Type 2 diabetes mellitus with other diabetic neurological complication: Secondary | ICD-10-CM | POA: Diagnosis not present

## 2023-07-02 DIAGNOSIS — R0989 Other specified symptoms and signs involving the circulatory and respiratory systems: Secondary | ICD-10-CM | POA: Diagnosis not present

## 2023-07-02 LAB — POCT GLYCOSYLATED HEMOGLOBIN (HGB A1C): Hemoglobin A1C: 6.9 % — AB (ref 4.0–5.6)

## 2023-07-02 NOTE — Patient Instructions (Signed)
  Call [facility] pharmacy and see if the trulicity is ready at the pharmacy and restart.    Regards,   Mort Sawyers FNP-C

## 2023-07-02 NOTE — Progress Notes (Signed)
Established Patient Office Visit  Subjective:      CC:  Chief Complaint  Patient presents with   Diabetes    HPI: Edward King is a 72 y.o. male presenting on 07/02/2023 for Diabetes . Dementia, h/o stroke: seeing neurology Dr. Carlynn Herald. On donepezil 5 mg once daily.  Recently seen by cardiologist, Dr. Juliann Pares HTN controlled, on valsartan.   Anxiety depression: does see psychiatrist, on wellbutrin remeron and klonipin prn   Balanitis/ DM2: treated with clotrimazole, d/c jardiance as suspected culprit. Advised to increase trulicity due to d/c jardiance to 1.5 mg weekly however pt states that he has not been taking because even though 7/3 medication was approved he states [facility] not [facility] to let him know it is ready. He has not yet called the pharmacy. The balanitis has resolved.   Lab Results  Component Value Date   HGBA1C 6.9 (A) 07/02/2023   Lab Results  Component Value Date   HGBA1C 6.9 (A) 07/02/2023    OSA: has a sleep study scheduled for June 22nd MRI scheduled for neurologist on the 23 or June.  Has f/u appt with urology June 30th.         Social history:  Relevant past medical, surgical, family and social history reviewed and updated as indicated. Interim medical history since our last visit reviewed.  Allergies and medications reviewed and updated.  DATA REVIEWED: CHART IN EPIC     ROS: Negative unless specifically indicated above in HPI.    Current Outpatient Medications:    ALEVAZOL 1 % OINT, APPLY 1 APPLICATION TOPICALLY 2 (TWO) TIMES DAILY FOR 14 DAYS., Disp: 56.7 g, Rfl: 0   aspirin EC 81 MG tablet, Take 1 tablet (81 mg total) by mouth daily. Swallow whole., Disp: 90 tablet, Rfl: 1   buPROPion (WELLBUTRIN SR) 150 MG 12 hr tablet, Take 150 mg by mouth daily., Disp: , Rfl:    clonazePAM (KLONOPIN) 0.5 MG tablet, Take 0.5 mg by mouth 3 (three) times daily as needed for anxiety., Disp: , Rfl:    Coenzyme Q10 (COQ-10) 100 MG CAPS,  Take 100 mg by mouth daily., Disp: , Rfl:    Cyanocobalamin (VITAMIN B-12 PO), Take 1,000 mcg by mouth daily at 6 (six) AM., Disp: , Rfl:    Dulaglutide (TRULICITY) 1.5 MG/0.5ML SOPN, Inject 1.5 mg into the skin once a week., Disp: 2 mL, Rfl: 2   gabapentin (NEURONTIN) 300 MG capsule, TAKE 1 BY MOUTH EVERY DAY IN THE MORNING, 1 AT MID DAY, THEN 2 NIGHTLY, Disp: 120 capsule, Rfl: 2   glucose blood (CONTOUR NEXT TEST) test strip, Use as instructed to check blood sugar daily, Disp: 100 each, Rfl: 4   mesalamine (APRISO) 0.375 g 24 hr capsule, Take 375 mg by mouth daily., Disp: , Rfl:    metFORMIN (GLUCOPHAGE-XR) 500 MG 24 hr tablet, TAKE 2 TABLETS BY MOUTH TWICE A DAY, Disp: 360 tablet, Rfl: 4   mirtazapine (REMERON) 30 MG tablet, Take 30 mg by mouth at bedtime., Disp: , Rfl:    pravastatin (PRAVACHOL) 40 MG tablet, TAKE 1 TABLET (40 MG TOTAL) BY MOUTH DAILY WITH LUNCH., Disp: 90 tablet, Rfl: 3   valsartan-hydrochlorothiazide (DIOVAN-HCT) 80-12.5 MG tablet, TAKE 1 TABLET BY MOUTH EVERY DAY, Disp: 90 tablet, Rfl: 1   venlafaxine XR (EFFEXOR-XR) 150 MG 24 hr capsule, Take 150 mg by mouth daily with breakfast., Disp: , Rfl:    Vitamin D, Cholecalciferol, 1000 units TABS, Take 2,000 Units by  mouth daily., Disp: , Rfl:    donepezil (ARICEPT) 5 MG tablet, Take 5 mg by mouth every morning., Disp: , Rfl:       Objective:    BP 136/82   Pulse 90   Ht 5\' 11"  (1.803 m)   Wt 273 lb (123.8 kg)   SpO2 96%   BMI 38.08 kg/m   Wt Readings from Last 3 Encounters:  07/02/23 273 lb (123.8 kg)  05/22/23 266 lb (120.7 kg)  03/27/23 272 lb 12.8 oz (123.7 kg)    Physical Exam Constitutional:      General: He is not in acute distress.    Appearance: Normal appearance. He is obese. He is not ill-appearing, toxic-appearing or diaphoretic.  Cardiovascular:     Rate and Rhythm: Normal rate and regular rhythm.  Pulmonary:     Effort: Pulmonary effort is normal.  Musculoskeletal:        General: Normal range  of motion.  Neurological:     General: No focal deficit present.     Mental Status: He is alert and oriented to person, place, and time. Mental status is at baseline.  Psychiatric:        Mood and Affect: Mood normal.        Behavior: Behavior normal.        Thought Content: Thought content normal.        Judgment: Judgment normal.    Diabetic Foot Form - Detailed   Diabetic Foot Exam - detailed Can the patient see the bottom of their feet?: No Are the shoes appropriate in style and fit?: Yes Is there swelling or and abnormal foot shape?: Yes Is there a claw toe deformity?: No Is there elevated skin temparature?: No Is there foot or ankle muscle weakness?: No Normal Range of Motion: Yes Right posterior Tibialias: Diminished Left posterior Tibialias: Diminished   Right Dorsalis Pedis: Present Left Dorsalis Pedis: Present  Semmes-Weinstein Monofilament Test R Site 1-Great Toe: Pos L Site 1-Great Toe: Pos                   Assessment & Plan:  Type 2 diabetes mellitus with other neurologic complication, without long-term current use of insulin (HCC) -     POCT glycosylated hemoglobin (Hb A1C)  Type 2 diabetes mellitus without complication, unspecified whether long term insulin use (HCC) Assessment & Plan: Advised pt to f/u with pharmacy to see if trulicity read at pharmacy  A1c is stable which is good Foot exam today  Urine m/a up to date.    Pedal edema Assessment & Plan: Ongoing. Will continue to monitor    Decreased pulses in feet Assessment & Plan: Stable.  Does see vascular as scheduled.       Return in about 6 months (around 01/02/2024) for f/u diabetes.  Mort Sawyers, MSN, APRN, FNP-C [facility]

## 2023-07-02 NOTE — Assessment & Plan Note (Signed)
Stable.  Does see vascular as scheduled.

## 2023-07-02 NOTE — Assessment & Plan Note (Signed)
Ongoing. Will continue to monitor

## 2023-07-02 NOTE — Assessment & Plan Note (Signed)
Advised pt to f/u with pharmacy to see if trulicity read at pharmacy  A1c is stable which is good Foot exam today  Urine m/a up to date.

## 2023-07-05 ENCOUNTER — Other Ambulatory Visit: Payer: Self-pay | Admitting: Family

## 2023-07-05 ENCOUNTER — Other Ambulatory Visit: Payer: Self-pay | Admitting: Family Medicine

## 2023-07-05 DIAGNOSIS — E1149 Type 2 diabetes mellitus with other diabetic neurological complication: Secondary | ICD-10-CM

## 2023-07-05 MED ORDER — CONTOUR NEXT TEST VI STRP
ORAL_STRIP | 3 refills | Status: AC
Start: 2023-07-05 — End: ?

## 2023-07-05 MED ORDER — CONTOUR NEXT ONE KIT
PACK | 0 refills | Status: AC
Start: 2023-07-05 — End: ?

## 2023-07-06 NOTE — Telephone Encounter (Signed)
Requested Prescriptions  Pending Prescriptions Disp Refills   TRULICITY 0.75 MG/0.5ML SOPN [Pharmacy Med Name: TRULICITY 0.75 MG/0.5 ML PEN]  3    Sig: INJECT 0.75 MG SUBCUTANEOUSLY ONE TIME PER WEEK     Endocrinology:  Diabetes - GLP-1 Receptor Agonists Failed - 07/05/2023  5:24 PM      Failed - Valid encounter within last 6 months    Recent Outpatient Visits           1 year ago Cough, unspecified type   [facility] Malva Limes, MD   1 year ago Dizziness   [facility] Malva Limes, MD   1 year ago Acute cough   [facility] Merita Norton T, FNP   1 year ago Primary hypertension   [place] [facility] [place], Glendo, PA-C   1 year ago Type 2 diabetes mellitus with other neurologic complication, without long-term current use of insulin [facility])   [place] [facility] Malva Limes, MD       Future Appointments             In 6 months Dugal, Wyatt Mage, FNP [facility]            Passed - HBA1C is between 0 and 7.9 and within 180 days    Hemoglobin A1C  Date Value Ref Range Status  07/02/2023 6.9 (A) 4.0 - 5.6 % Final  08/30/2014 6.0 4.2 - 6.3 % Final    Comment:    The American Diabetes Association recommends that a primary goal of therapy should be <7% and that physicians should reevaluate the treatment regimen in patients with HbA1c values consistently >8%.    Hgb A1c MFr Bld  Date Value Ref Range Status  12/21/2022 7.5 (H) 4.6 - 6.5 % Final    Comment:    Glycemic Control Guidelines for People with Diabetes:Non Diabetic:  <6%Goal of Therapy: <7%Additional Action Suggested:  >8%           pravastatin (PRAVACHOL) 40 MG tablet [Pharmacy Med Name: PRAVASTATIN SODIUM 40 MG TAB] 90 tablet 3    Sig: TAKE 1 TABLET (40 MG TOTAL) BY MOUTH DAILY WITH LUNCH.     Cardiovascular:  Antilipid - Statins Failed -  07/05/2023  5:24 PM      Failed - Valid encounter within last 12 months    Recent Outpatient Visits           1 year ago Cough, unspecified type   [facility] Malva Limes, MD   1 year ago Dizziness   [place] [facility] Malva Limes, MD   1 year ago Acute cough   [place] [facility] Jacky Kindle, FNP   1 year ago Primary hypertension   [facility] [place], Oral, PA-C   1 year ago Type 2 diabetes mellitus with other neurologic complication, without long-term current use of insulin [facility])    [facility] Malva Limes, MD       Future Appointments             In 6 months Mort Sawyers, FNP [facility]            Failed - Lipid Panel in normal range within the last 12 months    Cholesterol, Total  Date Value Ref  Range Status  05/05/2022 157 100 - 199 mg/dL Final   Cholesterol  Date Value Ref Range Status  09/13/2022 146 0 - 200 mg/dL Final    Comment:    ATP III Classification       Desirable:  < 200 mg/dL               Borderline High:  200 - 239 mg/dL          High:  > = 409 mg/dL  81/19/1478 295 0 - 621 mg/dL Final   Ldl Cholesterol, Calc  Date Value Ref Range Status  08/30/2014 119 (H) 0 - 100 mg/dL Final   LDL Chol Calc (NIH)  Date Value Ref Range Status  05/05/2022 81 0 - 99 mg/dL Final   LDL Cholesterol  Date Value Ref Range Status  09/13/2022 76 0 - 99 mg/dL Final   HDL Cholesterol  Date Value Ref Range Status  08/30/2014 40 40 - 60 mg/dL Final   HDL  Date Value Ref Range Status  09/13/2022 49.20 >39.00 mg/dL Final  30/86/5784 49 >69 mg/dL Final   Triglycerides  Date Value Ref Range Status  09/13/2022 106.0 0.0 - 149.0 mg/dL Final    Comment:    Normal:  <150 mg/dLBorderline High:  150 - 199 mg/dL  62/95/2841 73 0 - 324 mg/dL Final         Passed - Patient is not pregnant        metFORMIN (GLUCOPHAGE-XR) 500 MG 24 hr tablet [Pharmacy Med Name: METFORMIN HCL ER 500 MG TABLET] 360 tablet 4    Sig: TAKE 2 TABLETS BY MOUTH TWICE A DAY     Endocrinology:  Diabetes - Biguanides Failed - 07/05/2023  5:24 PM      Failed - Valid encounter within last 6 months    Recent Outpatient Visits           1 year ago Cough, unspecified type   [facility] Malva Limes, MD   1 year ago Dizziness   [facility] Malva Limes, MD   1 year ago Acute cough   [place] [facility] Jacky Kindle, FNP   1 year ago Primary hypertension   [facility] Lyman, [place], PA-C   1 year ago Type 2 diabetes mellitus with other neurologic complication, without long-term current use of insulin (HCC)   [place] [facility] Malva Limes, MD       Future Appointments             In 6 months Mort Sawyers, FNP [facility]            Failed - CBC within normal limits and completed in the last 12 months    WBC  Date Value Ref Range Status  09/13/2022 7.2 4.0 - 10.5 K/uL Final   RBC  Date Value Ref Range Status  09/13/2022 4.86 4.22 - 5.81 Mil/uL Final   Hemoglobin  Date Value Ref Range Status  09/13/2022 13.6 13.0 - 17.0 g/dL Final  40/09/2724 36.6 13.0 - 17.7 g/dL Final   HCT  Date Value Ref Range Status  09/13/2022 42.2 39.0 - 52.0 % Final   Hematocrit  Date Value Ref Range Status  05/05/2022 41.6 37.5 - 51.0 % Final   MCHC  Date Value Ref Range Status  09/13/2022 32.2 30.0 - 36.0 g/dL Final   [facility]  Date Value Ref Range Status  05/30/2022 26.7 26.0 - 34.0 pg Final   MCV  Date Value Ref Range Status  09/13/2022 86.8 78.0 - 100.0 fl Final  05/05/2022 83 79 - 97 fL Final  08/30/2014 87 80 - 100 fL Final   No results found for: "PLTCOUNTKUC", "LABPLAT", "POCPLA" RDW  Date Value Ref Range Status   09/13/2022 14.5 11.5 - 15.5 % Final  05/05/2022 14.0 11.6 - 15.4 % Final  08/30/2014 15.0 (H) 11.5 - 14.5 % Final         Passed - Cr in normal range and within 360 days    Creatinine  Date Value Ref Range Status  08/30/2014 1.10 0.60 - 1.30 mg/dL Final   Creatinine, Ser  Date Value Ref Range Status  12/21/2022 1.20 0.40 - 1.50 mg/dL Final   Creatinine, POC  Date Value Ref Range Status  01/24/2019 n/a mg/dL Final   Creatinine,U  Date Value Ref Range Status  12/21/2022 91.9 mg/dL Final         Passed - HBA1C is between 0 and 7.9 and within 180 days    Hemoglobin A1C  Date Value Ref Range Status  07/02/2023 6.9 (A) 4.0 - 5.6 % Final  08/30/2014 6.0 4.2 - 6.3 % Final    Comment:    The American Diabetes Association recommends that a primary goal of therapy should be <7% and that physicians should reevaluate the treatment regimen in patients with HbA1c values consistently >8%.    Hgb A1c MFr Bld  Date Value Ref Range Status  12/21/2022 7.5 (H) 4.6 - 6.5 % Final    Comment:    Glycemic Control Guidelines for People with Diabetes:Non Diabetic:  <6%Goal of Therapy: <7%Additional Action Suggested:  >8%          Passed - eGFR in normal range and within 360 days    EGFR (African American)  Date Value Ref Range Status  08/30/2014 >60  Final   GFR calc Af Amer  Date Value Ref Range Status  08/17/2020 84 >59 mL/min/1.73 Final    Comment:    **[facility] currently reports eGFR in compliance with the current**   recommendations of the [employer]. [facility] will   update reporting as new guidelines are published from the NKF-ASN   Task force.    EGFR (Non-African Amer.)  Date Value Ref Range Status  08/30/2014 >60  Final    Comment:    eGFR values <79mL/min/1.73 m2 may be an indication of chronic kidney disease (CKD). Calculated eGFR is useful in patients with stable renal function. The eGFR calculation will not be reliable in acutely ill patients when  serum creatinine is changing rapidly. It is not useful in  patients on dialysis. The eGFR calculation may not be applicable to patients at the low and high extremes of body sizes, pregnant women, and vegetarians.    GFR, Estimated  Date Value Ref Range Status  05/30/2022 >60 >60 mL/min Final    Comment:    (NOTE) Calculated using the CKD-EPI Creatinine Equation (2021)    GFR  Date Value Ref Range Status  12/21/2022 60.52 >60.00 mL/min Final    Comment:    Calculated using the CKD-EPI Creatinine Equation (2021)   eGFR  Date Value Ref Range Status  05/05/2022 58 (L) >59 mL/min/1.73 Final         Passed - B12 Level in normal range and within 720 days    Vitamin B-12  Date Value Ref Range  Status  05/05/2022 649 232 - 1,245 pg/mL Final

## 2023-07-10 ENCOUNTER — Ambulatory Visit
Admission: RE | Admit: 2023-07-10 | Discharge: 2023-07-10 | Disposition: A | Discharge status: Home / Self Care | Payer: PPO | Source: Ambulatory Visit | Attending: Student | Admitting: Student

## 2023-07-10 DIAGNOSIS — R29810 Facial weakness: Secondary | ICD-10-CM | POA: Diagnosis not present

## 2023-07-10 DIAGNOSIS — R42 Dizziness and giddiness: Secondary | ICD-10-CM | POA: Diagnosis not present

## 2023-07-10 DIAGNOSIS — I6782 Cerebral ischemia: Secondary | ICD-10-CM | POA: Diagnosis not present

## 2023-07-10 DIAGNOSIS — Z8673 Personal history of transient ischemic attack (TIA), and cerebral infarction without residual deficits: Secondary | ICD-10-CM | POA: Diagnosis not present

## 2023-07-17 ENCOUNTER — Ambulatory Visit: Payer: PPO | Attending: Otolaryngology

## 2023-07-17 DIAGNOSIS — R0602 Shortness of breath: Secondary | ICD-10-CM | POA: Insufficient documentation

## 2023-07-17 DIAGNOSIS — G4733 Obstructive sleep apnea (adult) (pediatric): Secondary | ICD-10-CM | POA: Diagnosis not present

## 2023-07-18 ENCOUNTER — Ambulatory Visit: Payer: PPO | Admitting: Urology

## 2023-07-18 VITALS — BP 135/88 | HR 75 | Ht 71.0 in | Wt 273.0 lb

## 2023-07-18 DIAGNOSIS — R350 Frequency of micturition: Secondary | ICD-10-CM | POA: Diagnosis not present

## 2023-07-18 DIAGNOSIS — N4883 Acquired buried penis: Secondary | ICD-10-CM

## 2023-07-18 DIAGNOSIS — N401 Enlarged prostate with lower urinary tract symptoms: Secondary | ICD-10-CM | POA: Diagnosis not present

## 2023-07-18 DIAGNOSIS — E291 Testicular hypofunction: Secondary | ICD-10-CM | POA: Diagnosis not present

## 2023-07-18 DIAGNOSIS — N3941 Urge incontinence: Secondary | ICD-10-CM | POA: Diagnosis not present

## 2023-07-18 LAB — URINALYSIS, COMPLETE
Bilirubin, UA: NEGATIVE
Ketones, UA: NEGATIVE
Leukocytes,UA: NEGATIVE
Nitrite, UA: NEGATIVE
Protein,UA: NEGATIVE
RBC, UA: NEGATIVE
Specific Gravity, UA: 1.02 (ref 1.005–1.030)
Urobilinogen, Ur: 0.2 mg/dL (ref 0.2–1.0)
pH, UA: 6 (ref 5.0–7.5)

## 2023-07-18 LAB — MICROSCOPIC EXAMINATION
Bacteria, UA: NONE SEEN
RBC, Urine: NONE SEEN /hpf (ref 0–2)

## 2023-07-18 LAB — BLADDER SCAN AMB NON-IMAGING: Scan Result: 12

## 2023-07-18 MED ORDER — TAMSULOSIN HCL 0.4 MG PO CAPS: 0.4000 mg | ORAL_CAPSULE | Freq: Every day | ORAL | 11 refills | Status: DC

## 2023-07-18 NOTE — Progress Notes (Signed)
Marcelle Overlie Plume,acting as a scribe for Vanna Scotland, MD.,have documented all relevant documentation on the behalf of Vanna Scotland, MD,as directed by  Vanna Scotland, MD while in the presence of Vanna Scotland, MD.  07/18/2023 11:28 AM   [name] Jul 25, 1951 [id number]  Referring provider: Mort Sawyers, FNP [address] [place],  [place] [address]  Chief Complaint Patient presents with  New Patient (Initial Visit)   HPI: 72 year-old male who presents today to establish care for urinary issues. He has a personal history of probable stroke and diabetes. His most recent hemoglobin A1C was 6.9, but as high as 8.8 a year ago. He also has a personal history of OSA and he was scheduled for a sleep study. He was also recently treated by his primary care for balanitis felt to be associated with Jardiance, which was stopped. He is no longer on medication for his hypogonadism. He is not on any BPH or bladder medications at this time.   He is a former patient of Dr.Wolff and he was last seen in 2019. I was able to review records from his office. He has a personal history of T1c Gleason 3+3 prostate cancer, status post I-125 brachytherapy in 2016. He also has a personal history of erectile dysfunction and hypogonadism. In 2019, he was interested in resuming testosterone gel due to lack of energy and decreased   libido. He ended up getting started on Fortesta 70 mg daily.   His most recent PSA on 09/20/2022 was undetectable.   His urinalysis today was negative other than trace glucose.  Today, he reports that he has been experiencing significant urinary frequency, particularly in the mornings. He wakes up around 6 AM and has already urinated three times during the night. After his initial morning void, he finds himself needing to urinate every 10-15 minutes for the next few hours. He describes his urinary stream as thin and sometimes has difficulty starting. He does not always feel like  he empties his bladder completely, especially after consuming large amounts of fluids. This has been ongoing for about a year and has become increasingly frustrating. He occasionally experiences urgency and has had a few instances of incontinence.  He reports that he completed a sleep study this morning and is waiting for the results.     Results for orders placed or performed in visit on 07/18/23 Microscopic Examination Urine Result Value Ref Range WBC, UA 0-5 0 - 5 /hpf RBC, Urine None seen 0 - 2 /hpf Epithelial Cells (non renal) 0-10 0 - 10 /hpf Bacteria, UA None seen None seen/Few Urinalysis, Complete Result Value Ref Range Specific Gravity, UA 1.020 1.005 - 1.030 pH, UA 6.0 5.0 - 7.5 Color, UA Yellow Yellow Appearance Ur Clear Clear Leukocytes,UA Negative Negative Protein,UA Negative Negative/Trace Glucose, UA Trace (A) Negative Ketones, UA Negative Negative RBC, UA Negative Negative Bilirubin, UA Negative Negative Urobilinogen, Ur 0.2 0.2 - 1.0 mg/dL Nitrite, UA Negative Negative Microscopic Examination See below: BLADDER SCAN AMB NON-IMAGING Result Value Ref Range Scan Result 12 ml  IPSS  Row Name 07/18/23 1000 International Prostate Symptom Score How often have you had the sensation of not emptying your bladder? Almost always How often have you had to urinate less than every two hours? Almost always How often have you found you stopped and started again several times when you urinated? About half the time How often have you found it difficult to postpone urination? Almost always How often have you  had a weak urinary stream? Almost always How often have you had to strain to start urination? Not at All How many times did you typically get up at night to urinate? 3 Times Total IPSS Score 26 Quality of Life due to urinary symptoms If you were to spend the rest of your life with your urinary condition just the way it is  now how would you feel about that? Mostly Disatisfied    Score:  1-7 Mild 8-19 Moderate 20-35 Severe      PMH: Past Medical History: Diagnosis Date  Arthritis  Cat bite 07/31/2018  Cellulitis of right leg without foot  Colitis  Depression  Diabetes mellitus without complication (HCC)  Giant cell arteritis (HCC) 06/15/2015  History of chicken pox  History of measles  History of mumps  Hypertension  Lymph edema both legs.  mostly left  Neuromuscular disorder (HCC)  Prostate CA (HCC) 2016 Radiation seed implants.  Stroke [facility])  Temporal arteritis (HCC) Clinically dx Dr. Gavin Potters. Negative temporal artrtery bx  Wears hearing aid in both ears   Surgical History: Past Surgical History: Procedure Laterality Date  ABDOMINOPLASTY  Carotid Doppler Ultrasound 08/22/2014 [facility]; Minimal plaque right. Minimal thisckening left Anterograde flow vertebrals  CATARACT EXTRACTION W/PHACO Right 07/03/2022 Procedure: CATARACT EXTRACTION PHACO AND INTRAOCULAR LENS PLACEMENT (IOC) RIGHT;  Surgeon: Nevada Crane, MD;  Location: [facility];  Service: Ophthalmology;  Laterality: Right;  Diabetic 2.36 00:18.8  CATARACT EXTRACTION W/PHACO Left 07/17/2022 Procedure: CATARACT EXTRACTION PHACO AND INTRAOCULAR LENS PLACEMENT (IOC) LEFT DIABETIC;  Surgeon: Nevada Crane, MD;  Location: [facility];  Service: Ophthalmology;  Laterality: Left;  Diabetic 3.06 00:23.1  COLONOSCOPY WITH PROPOFOL N/A 02/13/2018 Procedure: COLONOSCOPY WITH PROPOFOL;  Surgeon: Scot Jun, MD;  [facility];  Service: Endoscopy;  Laterality: N/A;  COLONOSCOPY WITH PROPOFOL N/A 05/22/2023 Procedure: COLONOSCOPY WITH PROPOFOL;  Surgeon: [name], MD;  Location: [facility];  Service: Gastroenterology;  Laterality: N/A;  CT of the head 08/22/2014 [facility]. Normal  DOPPLER ECHOCARDIOGRAPHY 08/29/2014 EF=60-65%. Normal LVEF  EYE  SURGERY  Myocardial Perfusion Scan 09/10/2012 Poor exercsie tolerance, but no evidence of stress induced myocardial ischemia. EF=66%. Dr. Juliann Pares  NASAL SEPTOPLASTY W/ TURBINOPLASTY Bilateral 02/10/2016 Procedure: NASAL SEPTOPLASTY WITH INFERIOR TURBINATE REDUCTION;  Surgeon: Bud Face, MD;  Location: [facility] ORS;  Service: ENT;  Laterality: Bilateral;  pannectomy  TEMPORAL ARTERY BIOPSY / LIGATION Left   Home Medications:  Allergies as of 07/18/2023     Reactions Jardiance [empagliflozin] Other (See Comments) balanitis   Medication List   Accurate as of July 18, 2023 11:28 AM. If you have any questions, ask your nurse or doctor.   Alevazol 1 % Oint Generic drug: Clotrimazole APPLY 1 APPLICATION TOPICALLY 2 (TWO) TIMES DAILY FOR 14 DAYS. aspirin EC 81 MG tablet Take 1 tablet (81 mg total) by mouth daily. Swallow whole. buPROPion 150 MG 12 hr tablet Commonly known as: WELLBUTRIN SR Take 150 mg by mouth daily. clonazePAM 0.5 MG tablet Commonly known as: KLONOPIN Take 0.5 mg by mouth 3 (three) times daily as needed for anxiety. Contour Next One Kit Use to check blood sugar daily Contour Next Test test strip Generic drug: glucose blood Use to check blood sugar daily CoQ-10 100 MG Caps Take 100 mg by mouth daily. donepezil 5 MG tablet Commonly known as: ARICEPT Take 5 mg by mouth every morning. gabapentin 300 MG capsule Commonly known as: NEURONTIN TAKE 1 BY MOUTH EVERY DAY IN THE MORNING, 1 AT MID DAY,  THEN 2 NIGHTLY mesalamine 0.375 g 24 hr capsule Commonly known as: APRISO Take 375 mg by mouth daily. metFORMIN 500 MG 24 hr tablet Commonly known as: GLUCOPHAGE-XR TAKE 2 TABLETS BY MOUTH TWICE A DAY mirtazapine 30 MG tablet Commonly known as: REMERON Take 30 mg by mouth at bedtime. pravastatin 40 MG tablet Commonly known as: PRAVACHOL TAKE 1 TABLET (40 MG TOTAL) BY MOUTH DAILY WITH LUNCH. tamsulosin 0.4 MG Caps capsule Commonly known as:  FLOMAX Take 1 capsule (0.4 mg total) by mouth daily. Started by: Vanna Scotland Trulicity 1.5 MG/0.5ML Sopn Generic drug: Dulaglutide Inject 1.5 mg into the skin once a week. valsartan-hydrochlorothiazide 80-12.5 MG tablet Commonly known as: DIOVAN-HCT TAKE 1 TABLET BY MOUTH EVERY DAY venlafaxine XR 150 MG 24 hr capsule Commonly known as: EFFEXOR-XR Take 150 mg by mouth daily with breakfast. VITAMIN B-12 PO Take 1,000 mcg by mouth daily at 6 (six) AM. Vitamin D (Cholecalciferol) 25 MCG (1000 UT) Tabs Take 2,000 Units by mouth daily.    Allergies:  Allergies Allergen Reactions  Jardiance [Empagliflozin] Other (See Comments) balanitis   Family History: Family History Problem Relation Age of Onset  Colon cancer Mother  Coronary artery disease Mother  Prostate cancer Father  Kidney disease Father  Dementia Brother  Diabetes Neg Hx   Social History:  reports that he has never smoked. He has never used smokeless tobacco. He reports current alcohol use. He reports that he does not use drugs.   Physical Exam: BP 135/88   Pulse 75   Ht 5\' 11"  (1.803 m)   Wt 273 lb (123.8 kg)   BMI 38.08 kg/m   Constitutional:  Alert and oriented, No acute distress. HEENT: [place] AT, moist mucus membranes.  Trachea midline, no masses. Neurologic: Grossly intact, no focal deficits, moving all 4 extremities. Psychiatric: Normal mood and affect.   Assessment & Plan:    1. BPH  - Start on Flomax 0.4 mg to help relax the prostate and improve urinary symptoms. - Follow-up in one month to assess response to medication. - If no improvement after one month, consider adding medication for overactive bladder. -Emptying bladder well, no concern for urinary retention -No evidence of urinary tract infection or concern for recurrent prostate cancer as contributing factor  2. Buried penis - He mentions that he has a large suprapubic fat pad which sometimes make it hard to urinate.   - Recommended some maneuvers including sitting and breathing - He mentioned that he had abdominoplasty in the past. He is high risk for a procedure like this and I would recommend more conservative management, at least for the time being.   3. Hypogonadism - We discussed the controversy and risks of testosterone therapy, especially post-cancer. We will address it down the road if the symptoms continue. - Recommend following up on his sleep studies as this may be a contributing factor to his non-specific fatigue-like symptoms  Return in about 1 month (around 08/18/2023) for assessment of medication efficacy.  I have reviewed the above documentation for accuracy and completeness, and I agree with the above.   Vanna Scotland, MD    [facility] [address], [address] [place], [place] [address] [phone]

## 2023-07-21 DIAGNOSIS — G4730 Sleep apnea, unspecified: Secondary | ICD-10-CM | POA: Diagnosis not present

## 2023-08-06 ENCOUNTER — Telehealth: Payer: Self-pay | Admitting: Urology

## 2023-08-06 MED ORDER — OXYBUTYNIN CHLORIDE ER 15 MG PO TB24: 15.0000 mg | ORAL_TABLET | Freq: Every day | ORAL | 11 refills | Status: DC

## 2023-08-06 NOTE — Telephone Encounter (Signed)
Patient called to let Dr. Apolinar Junes know that Tamsulosin has not helped like he had hoped it would. He said he was instructed to call and let her know. Please advise patient on next step.

## 2023-08-06 NOTE — Telephone Encounter (Signed)
Patient advised and medication sent in. Appointment rescheduled

## 2023-08-06 NOTE — Telephone Encounter (Signed)
Lets go ahead and start him on oxybutynin 15 mg XL, stop Flomax.  Lets try this medication for about a month, reschedule follow-up for 4 to 6 weeks.  Vanna Scotland, MD

## 2023-08-06 NOTE — Telephone Encounter (Signed)
Patient has follow up scheduled on 08/14/23, wait to discuss then?

## 2023-08-14 ENCOUNTER — Ambulatory Visit: Payer: PPO | Admitting: Urology

## 2023-08-16 ENCOUNTER — Ambulatory Visit (INDEPENDENT_AMBULATORY_CARE_PROVIDER_SITE_OTHER): Payer: PPO

## 2023-08-16 DIAGNOSIS — Z0000 Encounter for general adult medical examination without abnormal findings: Secondary | ICD-10-CM

## 2023-08-16 NOTE — Progress Notes (Signed)
Subjective:   Edward King is a 72 y.o. male who presents for Medicare Annual/Subsequent preventive examination.  Visit Complete: Virtual  I connected with  Edward King on 08/16/23 by a audio enabled telemedicine application and verified that I am speaking with the correct person using two identifiers. Wife was also on the call.  Patient Location: Home  Provider Location: Office/Clinic  I discussed the limitations of evaluation and management by telemedicine. The patient expressed understanding and agreed to proceed.  Patient Medicare AWV questionnaire was completed by the patient on 08/11/2023; I have confirmed that all information answered by patient is correct and no changes since this date.  Vital Signs: Unable to obtain new vitals due to this being a telehealth visit.  Review of Systems     Cardiac Risk Factors include: advanced age (>70men, >43 women);diabetes mellitus;hypertension;male gender     Objective:    Today's Vitals   There is no height or weight on file to calculate BMI.     08/16/2023    2:44 PM 05/22/2023    9:51 AM 07/17/2022    7:11 AM 07/03/2022    6:41 AM 05/30/2022    3:19 PM 05/08/2022    4:57 PM 12/07/2021    4:15 PM  Advanced Directives  Does Patient Have a Medical Advance Directive? No No No No No No No  Would patient like information on creating a medical advance directive?   No - Patient declined Yes (MAU/Ambulatory/Procedural Areas - Information given) No - Patient declined      Current Medications (verified) Outpatient Encounter Medications as of 08/16/2023  Medication Sig   aspirin EC 81 MG tablet Take 1 tablet (81 mg total) by mouth daily. Swallow whole.   Blood Glucose Monitoring Suppl (CONTOUR NEXT ONE) KIT Use to check blood sugar daily   buPROPion (WELLBUTRIN SR) 150 MG 12 hr tablet Take 150 mg by mouth daily.   clonazePAM (KLONOPIN) 0.5 MG tablet Take 0.5 mg by mouth 3 (three) times daily as needed for anxiety.   Coenzyme Q10  (COQ-10) 100 MG CAPS Take 100 mg by mouth daily.   Cyanocobalamin (VITAMIN B-12 PO) Take 1,000 mcg by mouth daily at 6 (six) AM.   donepezil (ARICEPT) 5 MG tablet Take 5 mg by mouth every morning.   Dulaglutide (TRULICITY) 1.5 MG/0.5ML SOPN Inject 1.5 mg into the skin once a week.   gabapentin (NEURONTIN) 300 MG capsule TAKE 1 BY MOUTH EVERY DAY IN THE MORNING, 1 AT MID DAY, THEN 2 NIGHTLY   glucose blood (CONTOUR NEXT TEST) test strip Use to check blood sugar daily   mesalamine (APRISO) 0.375 g 24 hr capsule Take 375 mg by mouth daily.   metFORMIN (GLUCOPHAGE-XR) 500 MG 24 hr tablet TAKE 2 TABLETS BY MOUTH TWICE A DAY   mirtazapine (REMERON) 30 MG tablet Take 30 mg by mouth at bedtime.   pravastatin (PRAVACHOL) 40 MG tablet TAKE 1 TABLET (40 MG TOTAL) BY MOUTH DAILY WITH LUNCH.   tamsulosin (FLOMAX) 0.4 MG CAPS capsule Take 0.4 mg by mouth daily.   valsartan-hydrochlorothiazide (DIOVAN-HCT) 80-12.5 MG tablet TAKE 1 TABLET BY MOUTH EVERY DAY   venlafaxine XR (EFFEXOR-XR) 150 MG 24 hr capsule Take 150 mg by mouth daily with breakfast.   Vitamin D, Cholecalciferol, 1000 units TABS Take 2,000 Units by mouth daily.   ALEVAZOL 1 % OINT APPLY 1 APPLICATION TOPICALLY 2 (TWO) TIMES DAILY FOR 14 DAYS. (Patient not taking: Reported on 08/16/2023)   oxybutynin (DITROPAN XL)  15 MG 24 hr tablet Take 1 tablet (15 mg total) by mouth daily. (Patient not taking: Reported on 08/16/2023)   No facility-administered encounter medications on file as of 08/16/2023.    Allergies (verified) Jardiance [empagliflozin]   History: Past Medical History:  Diagnosis Date   Arthritis    Cat bite 07/31/2018   Cellulitis of right leg without foot    Colitis    Depression    Diabetes mellitus without complication (HCC)    Giant cell arteritis (HCC) 06/15/2015   History of chicken pox    History of measles    History of mumps    Hypertension    Lymph edema    both legs.  mostly left   Neuromuscular disorder (HCC)     Prostate CA (HCC) 2016   Radiation seed implants.   Stroke [facility])    Temporal arteritis (HCC)    Clinically dx Dr. Gavin Potters. Negative temporal artrtery bx   Wears hearing aid in both ears    Past Surgical History:  Procedure Laterality Date   ABDOMINOPLASTY     Carotid Doppler Ultrasound  08/22/2014   [facility]; Minimal plaque right. Minimal thisckening left Anterograde flow vertebrals   CATARACT EXTRACTION W/PHACO Right 07/03/2022   Procedure: CATARACT EXTRACTION PHACO AND INTRAOCULAR LENS PLACEMENT (IOC) RIGHT;  Surgeon: Nevada Crane, MD;  Location: [facility];  Service: Ophthalmology;  Laterality: Right;  Diabetic 2.36 00:18.8   CATARACT EXTRACTION W/PHACO Left 07/17/2022   Procedure: CATARACT EXTRACTION PHACO AND INTRAOCULAR LENS PLACEMENT (IOC) LEFT DIABETIC;  Surgeon: Nevada Crane, MD;  Location: [facility];  Service: Ophthalmology;  Laterality: Left;  Diabetic 3.06 00:23.1   COLONOSCOPY WITH PROPOFOL N/A 02/13/2018   Procedure: COLONOSCOPY WITH PROPOFOL;  Surgeon: Scot Jun, MD;  Location: [facility] END[facility];  Service: Endoscopy;  Laterality: N/A;   COLONOSCOPY WITH PROPOFOL N/A 05/22/2023   Procedure: COLONOSCOPY WITH PROPOFOL;  Surgeon: [name], MD;  Location: [facility];  Service: Gastroenterology;  Laterality: N/A;   CT of the head  08/22/2014   [facility]. Normal   DOPPLER ECHOCARDIOGRAPHY  08/29/2014   EF=60-65%. Normal LVEF   EYE SURGERY     Myocardial Perfusion Scan  09/10/2012   Poor exercsie tolerance, but no evidence of stress induced myocardial ischemia. EF=66%. Dr. Juliann Pares   NASAL SEPTOPLASTY W/ TURBINOPLASTY Bilateral 02/10/2016   Procedure: NASAL SEPTOPLASTY WITH INFERIOR TURBINATE REDUCTION;  Surgeon: Bud Face, MD;  Location: [facility] ORS;  Service: ENT;  Laterality: Bilateral;   pannectomy     TEMPORAL ARTERY BIOPSY / LIGATION Left    Family History  Problem Relation Age of Onset   Colon cancer Mother    Coronary artery  disease Mother    Prostate cancer Father    Kidney disease Father    Dementia Brother    Diabetes Neg Hx    Social History   Socioeconomic History   Marital status: Married    Spouse name: Not on file   Number of children: 1   Years of education: Not on file   Highest education level: Some college, no degree  Occupational History   Occupation: Retired    Associate Professor: [employer]    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, 2017  Tobacco Use   Smoking status: Never   Smokeless tobacco: Never  Vaping Use   Vaping status: Never Used  Substance and Sexual Activity   Alcohol use: Not Currently    Comment: Rare - 1 drink   Drug use: No  Sexual activity: Not on file  Other Topics Concern   Not on file  Social History Narrative   Not on file   Social Determinants of Health   Financial Resource Strain: Low Risk  (08/16/2023)   Overall Financial Resource Strain (CARDIA)    Difficulty of Paying Living Expenses: Not hard at all  Food Insecurity: No Food Insecurity (08/16/2023)   Hunger Vital Sign    Worried About Running Out of Food in the Last Year: Never true    Ran Out of Food in the Last Year: Never true  Transportation Needs: No Transportation Needs (08/16/2023)   PRAPARE - Administrator, Civil Service (Medical): No    Lack of Transportation (Non-Medical): No  Physical Activity: Inactive (08/16/2023)   Exercise Vital Sign    Days of Exercise per Week: 0 days    Minutes of Exercise per Session: 0 min  Stress: No Stress Concern Present (08/16/2023)   [employer] of Occupational Health - Occupational Stress Questionnaire    Feeling of Stress : Only a little  Social Connections: Moderately Integrated (08/16/2023)   Social Connection and Isolation Panel [NHANES]    Frequency of Communication with Friends and Family: More than three times a week    Frequency of Social Gatherings with Friends and Family: Twice a week    Attends Religious Services: More than 4 times per  year    Active Member of [employer] or Organizations: No    Attends Engineer, structural: Never    Marital Status: Married    Tobacco Counseling Counseling given: Not Answered   Clinical Intake:  Pre-visit preparation completed: Yes  Pain : No/denies pain     Nutritional Risks: None Diabetes: Yes CBG done?: No Did pt. bring in CBG monitor from home?: No  How often do you need to have someone help you when you read instructions, pamphlets, or other written materials from your doctor or pharmacy?: 1 - Never  Interpreter Needed?: No  Information entered by :: NAllen LPN   Activities of Daily Living    08/11/2023   10:16 PM  In your present state of health, do you have any difficulty performing the following activities:  Hearing? 1  Comment has hearing aids  Vision? 0  Comment wears readers  Difficulty concentrating or making decisions? 0  Walking or climbing stairs? 1  Comment uses a can  Dressing or bathing? 0  Doing errands, shopping? 0  Preparing Food and eating ? N  Using the Toilet? N  In the past six months, have you accidently leaked urine? N  Do you have problems with loss of bowel control? N  Managing your Medications? N  Managing your Finances? N  Housekeeping or managing your Housekeeping? N    Patient Care Team: Mort Sawyers, FNP as PCP - General (Family Medicine) Alwyn Pea, MD as Consulting Physician (Cardiology) Kandyce Rud., MD as Consulting Physician (Rheumatology) Orson Ape, MD as Consulting Physician (Urology) Alycia Rossetti Anselm Pancoast, MD as Attending Physician (Psychiatry) Nevada Crane, MD as Consulting Physician (Ophthalmology) Collier Salina, MD as Referring Physician (Pain Medicine)  Indicate any recent Medical Services you may have received from other than [facility] providers in the past year (date may be approximate).     Assessment:   This is a routine wellness examination for  Hassani.  Hearing/Vision screen Hearing Screening - Comments:: Has hearing aids that are maintained Vision Screening - Comments:: Regular eye exams, [facility]  Dietary issues and exercise activities discussed:     Goals Addressed             This Visit's Progress    Patient Stated       08/16/2023, wants to move more       Depression Screen    08/16/2023    2:47 PM 05/05/2022    2:52 PM 03/14/2021    2:52 PM 08/17/2020    2:13 PM 08/20/2019   10:55 AM 04/01/2019    5:00 PM 01/01/2019   10:54 AM  PHQ 2/9 Scores  PHQ - 2 Score 0 0 1 0 1 1 1   PHQ- 9 Score 6 10     4     Fall Risk    08/11/2023   10:16 PM 05/05/2022    2:52 PM 03/14/2021    2:59 PM 01/05/2020    2:12 PM 08/20/2019   10:55 AM  Fall Risk   Falls in the past year? 1 0 0 0 1  Comment due to lack of balance      Number falls in past yr: 0 0 0 0 0  Injury with Fall? 1 0 0 0 0  Risk for fall due to : Impaired balance/gait;Impaired mobility;Medication side effect      Follow up Falls prevention discussed;Falls evaluation completed Falls evaluation completed  Falls evaluation completed Falls prevention discussed    MEDICARE RISK AT HOME: Medicare Risk at Home Any stairs in or around the home?: Yes If so, are there any without handrails?: Yes Home free of loose throw rugs in walkways, pet beds, electrical cords, etc?: Yes Adequate lighting in your home to reduce risk of falls?: Yes Life alert?: No Use of a cane, walker or w/c?: Yes Grab bars in the bathroom?: Yes Shower chair or bench in shower?: Yes Elevated toilet seat or a handicapped toilet?: Yes  TIMED UP AND GO:  Was the test performed?  No    Cognitive Function:  6 CIT not administered. Patient has diagnosis of cognitive impairment.        08/20/2019   11:04 AM 05/22/2018    2:41 PM 05/09/2017   10:20 AM  6CIT Screen  What Year? 0 points 0 points 0 points  What month? 0 points 0 points 0 points  What time? 0 points 0 points 0 points   Count back from 20 2 points 0 points 0 points  Months in reverse 0 points 4 points 2 points  Repeat phrase 2 points 8 points 4 points  Total Score 4 points 12 points 6 points    Immunizations Immunization History  Administered Date(s) Administered   Covid-19, Mrna,Vaccine(Spikevax)82yrs and older 01/24/2023   Fluad Quad(high Dose 65+) 09/05/2019, 08/30/2020, 08/26/2021, 09/13/2022   Influenza Whole 10/18/2012   Influenza, High Dose Seasonal PF 10/13/2017, 10/22/2018   Influenza,inj,Quad PF,6+ Mos 08/30/2015   Influenza,inj,quad, With Preservative 09/17/2018   Influenza-Unspecified 08/30/2015   PFIZER(Purple Top)SARS-COV-2 Vaccination 02/08/2020, 03/03/2020, 11/17/2020   Pneumococcal Conjugate-13 11/17/2015   Pneumococcal Polysaccharide-23 02/01/2006, 05/11/2017   Tdap 05/11/2017, 08/02/2018   Zoster, Live 04/29/2012    TDAP status: Up to date  Flu Vaccine status: Due, Education has been provided regarding the importance of this vaccine. Advised may receive this vaccine at local pharmacy or Health Dept. Aware to provide a copy of the vaccination record if obtained from local pharmacy or Health Dept. Verbalized acceptance and understanding.  Pneumococcal vaccine status: Up to date  Covid-19 vaccine status: Information provided on how to obtain  vaccines.   Qualifies for Shingles Vaccine? Yes   Zostavax completed No   Shingrix Completed?: No.    Education has been provided regarding the importance of this vaccine. Patient has been advised to call insurance company to determine out of pocket expense if they have not yet received this vaccine. Advised may also receive vaccine at local pharmacy or Health Dept. Verbalized acceptance and understanding.  Screening Tests Health Maintenance  Topic Date Due   FOOT EXAM  Never done   OPHTHALMOLOGY EXAM  06/15/2023   INFLUENZA VACCINE  07/19/2023   COVID-19 Vaccine (5 - 2023-24 season) 03/26/2024 (Originally 03/21/2023)   Zoster Vaccines-  Shingrix (1 of 2) 06/16/2024 (Originally 09/16/1970)   Diabetic kidney evaluation - eGFR measurement  12/22/2023   Diabetic kidney evaluation - Urine ACR  12/22/2023   HEMOGLOBIN A1C  01/02/2024   Medicare Annual Wellness (AWV)  08/15/2024   Colonoscopy  05/21/2026   DTaP/Tdap/Td (3 - Td or Tdap) 08/02/2028   Pneumonia Vaccine 79+ Years old  Completed   Hepatitis C Screening  Completed   HPV VACCINES  Aged Out    Health Maintenance  Health Maintenance Due  Topic Date Due   FOOT EXAM  Never done   OPHTHALMOLOGY EXAM  06/15/2023   INFLUENZA VACCINE  07/19/2023    Colorectal cancer screening: To be rescheduled  Lung Cancer Screening: (Low Dose CT Chest recommended if Age 42-80 years, 20 pack-year currently smoking OR have quit w/in 15years.) does not qualify.   Lung Cancer Screening Referral: no  Additional Screening:  Hepatitis C Screening: does qualify; Completed 02/27/2014  Vision Screening: Recommended annual ophthalmology exams for early detection of glaucoma and other disorders of the eye. Is the patient up to date with their annual eye exam?  Yes  Who is the provider or what is the name of the office in which the patient attends annual eye exams? [facility] If pt is not established with a provider, would they like to be referred to a provider to establish care? No .   Dental Screening: Recommended annual dental exams for proper oral hygiene  Diabetic Foot Exam: Diabetic Foot Exam: Overdue, Pt has been advised about the importance in completing this exam. Pt is scheduled for diabetic foot exam on next appointment.  Community Resource Referral / Chronic Care Management: CRR required this visit?  No   CCM required this visit?  No     Plan:     I have personally reviewed and noted the following in the patient's chart:   Medical and social history Use of alcohol, tobacco or illicit drugs  Current medications and supplements including opioid prescriptions.  Patient is not currently taking opioid prescriptions. Functional ability and status Nutritional status Physical activity Advanced directives List of other physicians Hospitalizations, surgeries, and ER visits in previous 12 months Vitals Screenings to include cognitive, depression, and falls Referrals and appointments  In addition, I have reviewed and discussed with patient certain preventive protocols, quality metrics, and best practice recommendations. A written personalized care plan for preventive services as well as general preventive health recommendations were provided to patient.     Barb Merino, LPN   [id number]/[id number]   After Visit Summary: (MyChart) Due to this being a telephonic visit, the after visit summary with patients personalized plan was offered to patient via MyChart   Nurse Notes: none

## 2023-08-16 NOTE — Patient Instructions (Addendum)
Edward King , Thank you for taking time to come for your Medicare Wellness Visit. I appreciate your ongoing commitment to your health goals. Please review the following plan we discussed and let me know if I can assist you in the future.   Referrals/Orders/Follow-Ups/Clinician Recommendations: none  This is a list of the screening recommended for you and due dates:  Health Maintenance  Topic Date Due   Complete foot exam   Never done   Eye exam for diabetics  06/15/2023   Flu Shot  07/19/2023   COVID-19 Vaccine (5 - 2023-24 season) 03/26/2024*   Zoster (Shingles) Vaccine (1 of 2) 06/16/2024*   Yearly kidney function blood test for diabetes  12/22/2023   Yearly kidney health urinalysis for diabetes  12/22/2023   Hemoglobin A1C  01/02/2024   Medicare Annual Wellness Visit  08/15/2024   Colon Cancer Screening  05/21/2026   DTaP/Tdap/Td vaccine (3 - Td or Tdap) 08/02/2028   Pneumonia Vaccine  Completed   Hepatitis C Screening  Completed   HPV Vaccine  Aged Out  *Topic was postponed. The date shown is not the original due date.    Advanced directives: (ACP Link)Information on Advanced Care Planning can be found at [facility] Advance Health Care Directives Advance Health Care Directives ([email or web address]   Next Medicare Annual Wellness Visit scheduled for next year: Yes  insert Preventive Care attachment Insert FALL PREVENTION attachment if needed

## 2023-08-24 DIAGNOSIS — F331 Major depressive disorder, recurrent, moderate: Secondary | ICD-10-CM | POA: Diagnosis not present

## 2023-08-24 DIAGNOSIS — F418 Other specified anxiety disorders: Secondary | ICD-10-CM | POA: Diagnosis not present

## 2023-08-27 DIAGNOSIS — G4733 Obstructive sleep apnea (adult) (pediatric): Secondary | ICD-10-CM | POA: Diagnosis not present

## 2023-08-28 ENCOUNTER — Inpatient Hospital Stay
Admission: EM | Admit: 2023-08-28 | Discharge: 2023-08-30 | DRG: 184 | Disposition: A | Discharge status: Home Health | Payer: PPO | Attending: Internal Medicine | Admitting: Internal Medicine

## 2023-08-28 ENCOUNTER — Emergency Department: Payer: PPO

## 2023-08-28 ENCOUNTER — Other Ambulatory Visit: Payer: Self-pay

## 2023-08-28 ENCOUNTER — Encounter: Payer: Self-pay | Admitting: Emergency Medicine

## 2023-08-28 DIAGNOSIS — Z8249 Family history of ischemic heart disease and other diseases of the circulatory system: Secondary | ICD-10-CM

## 2023-08-28 DIAGNOSIS — E1142 Type 2 diabetes mellitus with diabetic polyneuropathy: Secondary | ICD-10-CM | POA: Diagnosis not present

## 2023-08-28 DIAGNOSIS — Z888 Allergy status to other drugs, medicaments and biological substances status: Secondary | ICD-10-CM

## 2023-08-28 DIAGNOSIS — R911 Solitary pulmonary nodule: Secondary | ICD-10-CM | POA: Diagnosis not present

## 2023-08-28 DIAGNOSIS — I10 Essential (primary) hypertension: Secondary | ICD-10-CM | POA: Diagnosis present

## 2023-08-28 DIAGNOSIS — F0394 Unspecified dementia, unspecified severity, with anxiety: Secondary | ICD-10-CM | POA: Diagnosis present

## 2023-08-28 DIAGNOSIS — Y929 Unspecified place or not applicable: Secondary | ICD-10-CM

## 2023-08-28 DIAGNOSIS — Z79899 Other long term (current) drug therapy: Secondary | ICD-10-CM

## 2023-08-28 DIAGNOSIS — R0781 Pleurodynia: Secondary | ICD-10-CM | POA: Diagnosis not present

## 2023-08-28 DIAGNOSIS — S2242XA Multiple fractures of ribs, left side, initial encounter for closed fracture: Secondary | ICD-10-CM | POA: Diagnosis not present

## 2023-08-28 DIAGNOSIS — S2249XA Multiple fractures of ribs, unspecified side, initial encounter for closed fracture: Secondary | ICD-10-CM | POA: Diagnosis not present

## 2023-08-28 DIAGNOSIS — Z23 Encounter for immunization: Secondary | ICD-10-CM | POA: Diagnosis not present

## 2023-08-28 DIAGNOSIS — Z841 Family history of disorders of kidney and ureter: Secondary | ICD-10-CM

## 2023-08-28 DIAGNOSIS — E785 Hyperlipidemia, unspecified: Secondary | ICD-10-CM | POA: Diagnosis present

## 2023-08-28 DIAGNOSIS — N400 Enlarged prostate without lower urinary tract symptoms: Secondary | ICD-10-CM | POA: Diagnosis present

## 2023-08-28 DIAGNOSIS — F32A Depression, unspecified: Secondary | ICD-10-CM | POA: Diagnosis present

## 2023-08-28 DIAGNOSIS — F0393 Unspecified dementia, unspecified severity, with mood disturbance: Secondary | ICD-10-CM | POA: Diagnosis present

## 2023-08-28 DIAGNOSIS — Z8673 Personal history of transient ischemic attack (TIA), and cerebral infarction without residual deficits: Secondary | ICD-10-CM

## 2023-08-28 DIAGNOSIS — Z9841 Cataract extraction status, right eye: Secondary | ICD-10-CM

## 2023-08-28 DIAGNOSIS — W19XXXA Unspecified fall, initial encounter: Secondary | ICD-10-CM

## 2023-08-28 DIAGNOSIS — M778 Other enthesopathies, not elsewhere classified: Secondary | ICD-10-CM | POA: Diagnosis not present

## 2023-08-28 DIAGNOSIS — Z8546 Personal history of malignant neoplasm of prostate: Secondary | ICD-10-CM

## 2023-08-28 DIAGNOSIS — Z9842 Cataract extraction status, left eye: Secondary | ICD-10-CM

## 2023-08-28 DIAGNOSIS — Z800 Family history of malignant neoplasm of digestive organs: Secondary | ICD-10-CM

## 2023-08-28 DIAGNOSIS — Z7982 Long term (current) use of aspirin: Secondary | ICD-10-CM

## 2023-08-28 DIAGNOSIS — W010XXA Fall on same level from slipping, tripping and stumbling without subsequent striking against object, initial encounter: Secondary | ICD-10-CM | POA: Diagnosis present

## 2023-08-28 DIAGNOSIS — Z043 Encounter for examination and observation following other accident: Secondary | ICD-10-CM | POA: Diagnosis not present

## 2023-08-28 DIAGNOSIS — Z961 Presence of intraocular lens: Secondary | ICD-10-CM | POA: Diagnosis present

## 2023-08-28 DIAGNOSIS — S41112A Laceration without foreign body of left upper arm, initial encounter: Secondary | ICD-10-CM | POA: Diagnosis present

## 2023-08-28 DIAGNOSIS — J9811 Atelectasis: Secondary | ICD-10-CM | POA: Diagnosis present

## 2023-08-28 DIAGNOSIS — Z7984 Long term (current) use of oral hypoglycemic drugs: Secondary | ICD-10-CM

## 2023-08-28 DIAGNOSIS — Z7985 Long-term (current) use of injectable non-insulin antidiabetic drugs: Secondary | ICD-10-CM

## 2023-08-28 DIAGNOSIS — Z8042 Family history of malignant neoplasm of prostate: Secondary | ICD-10-CM

## 2023-08-28 LAB — CBC
HCT: 46.3 % (ref 39.0–52.0)
Hemoglobin: 14.5 g/dL (ref 13.0–17.0)
MCH: 28.3 pg (ref 26.0–34.0)
MCHC: 31.3 g/dL (ref 30.0–36.0)
MCV: 90.4 fL (ref 80.0–100.0)
Platelets: 215 10*3/uL (ref 150–400)
RBC: 5.12 MIL/uL (ref 4.22–5.81)
RDW: 13.2 % (ref 11.5–15.5)
WBC: 14.1 10*3/uL — ABNORMAL HIGH (ref 4.0–10.5)
nRBC: 0 % (ref 0.0–0.2)

## 2023-08-28 LAB — COMPREHENSIVE METABOLIC PANEL
ALT: 21 U/L (ref 0–44)
AST: 23 U/L (ref 15–41)
Albumin: 3.8 g/dL (ref 3.5–5.0)
Alkaline Phosphatase: 79 U/L (ref 38–126)
Anion gap: 11 (ref 5–15)
BUN: 21 mg/dL (ref 8–23)
CO2: 27 mmol/L (ref 22–32)
Calcium: 9 mg/dL (ref 8.9–10.3)
Chloride: 101 mmol/L (ref 98–111)
Creatinine, Ser: 1.36 mg/dL — ABNORMAL HIGH (ref 0.61–1.24)
GFR, Estimated: 55 mL/min — ABNORMAL LOW (ref >60)
Glucose, Bld: 126 mg/dL — ABNORMAL HIGH (ref 70–99)
Potassium: 3.6 mmol/L (ref 3.5–5.1)
Sodium: 139 mmol/L (ref 135–145)
Total Bilirubin: 0.6 mg/dL (ref 0.3–1.2)
Total Protein: 7 g/dL (ref 6.5–8.1)

## 2023-08-28 MED ORDER — MORPHINE SULFATE (PF) 4 MG/ML IV SOLN
6.0000 mg | Freq: Once | INTRAVENOUS | Status: DC
  Filled 2023-08-28: qty 2

## 2023-08-28 MED ORDER — ACETAMINOPHEN 325 MG PO TABS: 650.0000 mg | ORAL_TABLET | Freq: Four times a day (QID) | ORAL | Status: DC | PRN

## 2023-08-28 MED ORDER — TAMSULOSIN HCL 0.4 MG PO CAPS
0.4000 mg | ORAL_CAPSULE | Freq: Every day | ORAL | Status: DC
  Administered 2023-08-29 – 2023-08-30 (×2): 0.4 mg via ORAL
  Filled 2023-08-28 (×2): qty 1

## 2023-08-28 MED ORDER — MORPHINE SULFATE (PF) 4 MG/ML IV SOLN
6.0000 mg | Freq: Once | INTRAVENOUS | Status: AC
  Administered 2023-08-28: 6 mg via INTRAVENOUS

## 2023-08-28 MED ORDER — GABAPENTIN 300 MG PO CAPS
600.0000 mg | ORAL_CAPSULE | Freq: Every day | ORAL | Status: DC
  Administered 2023-08-29 (×2): 600 mg via ORAL
  Filled 2023-08-28 (×2): qty 2

## 2023-08-28 MED ORDER — MESALAMINE 400 MG PO CPDR
400.0000 mg | DELAYED_RELEASE_CAPSULE | Freq: Every day | ORAL | Status: DC
  Administered 2023-08-30: 400 mg via ORAL
  Filled 2023-08-28: qty 1

## 2023-08-28 MED ORDER — PRAVASTATIN SODIUM 20 MG PO TABS
40.0000 mg | ORAL_TABLET | Freq: Every day | ORAL | Status: DC
  Administered 2023-08-29 – 2023-08-30 (×2): 40 mg via ORAL
  Filled 2023-08-28 (×2): qty 2

## 2023-08-28 MED ORDER — MORPHINE SULFATE (PF) 2 MG/ML IV SOLN
2.0000 mg | INTRAVENOUS | Status: DC | PRN
  Administered 2023-08-29 (×2): 2 mg via INTRAVENOUS
  Filled 2023-08-28 (×3): qty 1

## 2023-08-28 MED ORDER — COQ-10 100 MG PO CAPS: 100.0000 mg | ORAL_CAPSULE | Freq: Every day | ORAL | Status: DC

## 2023-08-28 MED ORDER — INSULIN ASPART 100 UNIT/ML IJ SOLN
0.0000 [IU] | Freq: Three times a day (TID) | INTRAMUSCULAR | Status: DC
  Administered 2023-08-29: 1 [IU] via SUBCUTANEOUS
  Administered 2023-08-29 – 2023-08-30 (×3): 2 [IU] via SUBCUTANEOUS
  Filled 2023-08-28 (×4): qty 1

## 2023-08-28 MED ORDER — TETANUS-DIPHTH-ACELL PERTUSSIS 5-2.5-18.5 LF-MCG/0.5 IM SUSY
0.5000 mL | PREFILLED_SYRINGE | Freq: Once | INTRAMUSCULAR | Status: AC
  Administered 2023-08-28: 0.5 mL via INTRAMUSCULAR
  Filled 2023-08-28: qty 0.5

## 2023-08-28 MED ORDER — OXYCODONE-ACETAMINOPHEN 5-325 MG PO TABS
1.0000 | ORAL_TABLET | ORAL | Status: DC | PRN
  Administered 2023-08-29 – 2023-08-30 (×6): 2 via ORAL
  Filled 2023-08-28 (×8): qty 2

## 2023-08-28 MED ORDER — TRAZODONE HCL 50 MG PO TABS
25.0000 mg | ORAL_TABLET | Freq: Every evening | ORAL | Status: DC | PRN
  Administered 2023-08-29: 25 mg via ORAL
  Filled 2023-08-28: qty 1

## 2023-08-28 MED ORDER — ACETAMINOPHEN 650 MG RE SUPP: 650.0000 mg | Freq: Four times a day (QID) | RECTAL | Status: DC | PRN

## 2023-08-28 MED ORDER — INSULIN ASPART 100 UNIT/ML IJ SOLN: 0.0000 [IU] | Freq: Every day | INTRAMUSCULAR | Status: DC

## 2023-08-28 MED ORDER — ONDANSETRON HCL 4 MG PO TABS: 4.0000 mg | ORAL_TABLET | Freq: Four times a day (QID) | ORAL | Status: DC | PRN

## 2023-08-28 MED ORDER — ONDANSETRON HCL 4 MG/2ML IJ SOLN: 4.0000 mg | Freq: Four times a day (QID) | INTRAMUSCULAR | Status: DC | PRN

## 2023-08-28 MED ORDER — CLONAZEPAM 0.5 MG PO TABS: 0.5000 mg | ORAL_TABLET | Freq: Three times a day (TID) | ORAL | Status: DC | PRN

## 2023-08-28 MED ORDER — OXYCODONE-ACETAMINOPHEN 5-325 MG PO TABS
1.0000 | ORAL_TABLET | Freq: Once | ORAL | Status: AC
  Administered 2023-08-28: 1 via ORAL
  Filled 2023-08-28: qty 1

## 2023-08-28 MED ORDER — VITAMIN D 25 MCG (1000 UNIT) PO TABS
2000.0000 [IU] | ORAL_TABLET | Freq: Every day | ORAL | Status: DC
  Administered 2023-08-29 – 2023-08-30 (×2): 2000 [IU] via ORAL
  Filled 2023-08-28 (×2): qty 2

## 2023-08-28 MED ORDER — MAGNESIUM HYDROXIDE 400 MG/5ML PO SUSP: 30.0000 mL | Freq: Every day | ORAL | Status: DC | PRN

## 2023-08-28 MED ORDER — DONEPEZIL HCL 5 MG PO TABS
5.0000 mg | ORAL_TABLET | Freq: Every morning | ORAL | Status: DC
  Administered 2023-08-29 – 2023-08-30 (×2): 5 mg via ORAL
  Filled 2023-08-28 (×2): qty 1

## 2023-08-28 MED ORDER — VALSARTAN-HYDROCHLOROTHIAZIDE 80-12.5 MG PO TABS: 1.0000 | ORAL_TABLET | Freq: Every day | ORAL | Status: DC

## 2023-08-28 MED ORDER — ENOXAPARIN SODIUM 60 MG/0.6ML IJ SOSY
0.5000 mg/kg | PREFILLED_SYRINGE | INTRAMUSCULAR | Status: DC
  Administered 2023-08-29 – 2023-08-30 (×2): 60 mg via SUBCUTANEOUS
  Filled 2023-08-28 (×2): qty 0.6

## 2023-08-28 MED ORDER — BUPROPION HCL ER (SR) 150 MG PO TB12
150.0000 mg | ORAL_TABLET | Freq: Every day | ORAL | Status: DC
  Administered 2023-08-29 – 2023-08-30 (×2): 150 mg via ORAL
  Filled 2023-08-28 (×2): qty 1

## 2023-08-28 MED ORDER — VENLAFAXINE HCL ER 150 MG PO CP24
150.0000 mg | ORAL_CAPSULE | Freq: Every day | ORAL | Status: DC
  Administered 2023-08-29 – 2023-08-30 (×2): 150 mg via ORAL
  Filled 2023-08-28 (×2): qty 1

## 2023-08-28 MED ORDER — VITAMIN B-12 1000 MCG PO TABS
1000.0000 ug | ORAL_TABLET | Freq: Every day | ORAL | Status: DC
  Administered 2023-08-29 – 2023-08-30 (×2): 1000 ug via ORAL
  Filled 2023-08-28 (×2): qty 1

## 2023-08-28 MED ORDER — MIRTAZAPINE 15 MG PO TABS
30.0000 mg | ORAL_TABLET | Freq: Every day | ORAL | Status: DC
  Administered 2023-08-29 (×2): 30 mg via ORAL
  Filled 2023-08-28 (×2): qty 2

## 2023-08-28 MED ORDER — SODIUM CHLORIDE 0.9 % IV SOLN: INTRAVENOUS | Status: DC

## 2023-08-28 MED ORDER — ASPIRIN 81 MG PO TBEC
81.0000 mg | DELAYED_RELEASE_TABLET | Freq: Every day | ORAL | Status: DC
  Administered 2023-08-29 – 2023-08-30 (×2): 81 mg via ORAL
  Filled 2023-08-28 (×2): qty 1

## 2023-08-28 NOTE — Assessment & Plan Note (Signed)
-   We will continue Flomax 

## 2023-08-28 NOTE — H&P (Signed)
[place]   PATIENT NAME: Edward King    MR#:  [id number]  DATE OF BIRTH:  Aug 15, 1951  DATE OF ADMISSION:  08/28/2023  PRIMARY CARE PHYSICIAN: Mort Sawyers, FNP   Patient is coming from: Home  REQUESTING/REFERRING PHYSICIAN: Lorenza Cambridge, MD  CHIEF COMPLAINT:   Chief Complaint  Patient presents with   Fall    HISTORY OF PRESENT ILLNESS:  EMILIANO HOLLETT is a 72 y.o. male with medical history significant for osteoarthritis, type 2 diabetes mellitus, depression, hypertension and CVA, who presented to the emergency room with acute onset of accidental mechanical fall.  The patient was turning around and apparently lost balance when he fell on his left side with subsequent left elbow and chest wall pain.  He denied any paresthesias or focal muscle weakness.  No presyncope or syncope.  No fever or chills.  No nausea or vomiting or abdominal pain.  No cough or wheezing or hemoptysis.  ED Course: When the patient came to the ER, BP was 131/110 with otherwise normal vital signs.  Labs revealed creatinine 1.36 with a glucose of 126 with otherwise unremarkable CMP.  CBC showed leukocytosis of 14.1. EKG as reviewed by me : Pending. Imaging: Two-view chest x-ray showed no acute cardiopulmonary disease.  2 view left elbow x-ray showed soft tissue irregularity along the dorsal lateral elbow potentially from laceration/skin tear with degenerative sparing of the elbow with no acute bony findings.    Chest CT without contrast revealed the following: 1. Acute fractures of the left fourth, fifth, sixth, seventh, eighth, and ninth ribs. The fifth and sixth rib fractures are segmental, with the anterior sixth rib fracture being mildly displaced. 2. Scattered bandlike atelectasis in both lower lobes and in the lingula. 3. Chronically stable and partially calcified 1.0 by 0.9 cm nodule in the right lower lobe along the bottom of the major fissure, no change from 03/06/2016, benign. No  further imaging workup of this lesion is indicated. 4. Coronary, aortic arch, and branch vessel atherosclerotic vascular disease. 5. Degenerative glenohumeral arthropathy bilaterally. 6. Thoracic spondylosis. 7. Aortic atherosclerosis.  The patient was given 1 p.o. Percocet and 6 mg of IV morphine sulfate.  He will be admitted to an observation medical bed for further evaluation and management. PAST MEDICAL HISTORY:   Past Medical History:  Diagnosis Date   Arthritis    Cat bite 07/31/2018   Cellulitis of right leg without foot    Colitis    Depression    Diabetes mellitus without complication (HCC)    Giant cell arteritis (HCC) 06/15/2015   History of chicken pox    History of measles    History of mumps    Hypertension    Lymph edema    both legs.  mostly left   Neuromuscular disorder (HCC)    Prostate CA (HCC) 2016   Radiation seed implants.   Stroke [facility])    Temporal arteritis (HCC)    Clinically dx Dr. Gavin Potters. Negative temporal artrtery bx   Wears hearing aid in both ears     PAST SURGICAL HISTORY:   Past Surgical History:  Procedure Laterality Date   ABDOMINOPLASTY     Carotid Doppler Ultrasound  08/22/2014   [facility]; Minimal plaque right. Minimal thisckening left Anterograde flow vertebrals   CATARACT EXTRACTION W/PHACO Right 07/03/2022   Procedure: CATARACT EXTRACTION PHACO AND INTRAOCULAR LENS PLACEMENT (IOC) RIGHT;  Surgeon: Nevada Crane, MD;  Location: [facility];  Service: Ophthalmology;  Laterality:  Right;  Diabetic 2.36 00:18.8   CATARACT EXTRACTION W/PHACO Left 07/17/2022   Procedure: CATARACT EXTRACTION PHACO AND INTRAOCULAR LENS PLACEMENT (IOC) LEFT DIABETIC;  Surgeon: Nevada Crane, MD;  Location: [facility];  Service: Ophthalmology;  Laterality: Left;  Diabetic 3.06 00:23.1   COLONOSCOPY WITH PROPOFOL N/A 02/13/2018   Procedure: COLONOSCOPY WITH PROPOFOL;  Surgeon: Scot Jun, MD;  Location: [facility] ENDOSCOPY;   Service: Endoscopy;  Laterality: N/A;   COLONOSCOPY WITH PROPOFOL N/A 05/22/2023   Procedure: COLONOSCOPY WITH PROPOFOL;  Surgeon: [name], MD;  Location: [facility] END[facility]COPY;  Service: Gastroenterology;  Laterality: N/A;   CT of the head  08/22/2014   [facility]. Normal   DOPPLER ECHOCARDIOGRAPHY  08/29/2014   EF=60-65%. Normal LVEF   EYE SURGERY     Myocardial Perfusion Scan  09/10/2012   Poor exercsie tolerance, but no evidence of stress induced myocardial ischemia. EF=66%. Dr. Juliann Pares   NASAL SEPTOPLASTY W/ TURBINOPLASTY Bilateral 02/10/2016   Procedure: NASAL SEPTOPLASTY WITH INFERIOR TURBINATE REDUCTION;  Surgeon: Bud Face, MD;  Location: [facility] ORS;  Service: ENT;  Laterality: Bilateral;   pannectomy     TEMPORAL ARTERY BIOPSY / LIGATION Left     SOCIAL HISTORY:   Social History   Tobacco Use   Smoking status: Never   Smokeless tobacco: Never  Substance Use Topics   Alcohol use: Not Currently    Comment: Rare - 1 drink    FAMILY HISTORY:   Family History  Problem Relation Age of Onset   Colon cancer Mother    Coronary artery disease Mother    Prostate cancer Father    Kidney disease Father    Dementia Brother    Diabetes Neg Hx     DRUG ALLERGIES:   Allergies  Allergen Reactions   Jardiance [Empagliflozin] Other (See Comments)    balanitis    REVIEW OF SYSTEMS:   ROS As per history of present illness. All pertinent systems were reviewed above. Constitutional, HEENT, cardiovascular, respiratory, GI, GU, musculoskeletal, neuro, psychiatric, endocrine, integumentary and hematologic systems were reviewed and are otherwise negative/unremarkable except for positive findings mentioned above in the HPI.   MEDICATIONS AT HOME:   Prior to Admission medications   Medication Sig Start Date End Date Taking? Authorizing Provider  aspirin EC 81 MG tablet Take 1 tablet (81 mg total) by mouth daily. Swallow whole. 09/20/22  Yes Dugal, Wyatt Mage, FNP  buPROPion (WELLBUTRIN  SR) 150 MG 12 hr tablet Take 150 mg by mouth daily.   Yes [provider]  clonazePAM (KLONOPIN) 0.5 MG tablet Take 0.5 mg by mouth 3 (three) times daily as needed for anxiety.   Yes [provider]  Coenzyme Q10 (COQ-10) 100 MG CAPS Take 100 mg by mouth daily.   Yes [provider]  Cyanocobalamin (VITAMIN B-12 PO) Take 1,000 mcg by mouth daily at 6 (six) AM.   Yes [provider]  donepezil (ARICEPT) 5 MG tablet Take 5 mg by mouth every morning.   Yes [provider]  Dulaglutide (TRULICITY) 1.5 MG/0.5ML SOPN Inject 1.5 mg into the skin once a week. 06/21/23  Yes Dugal, Wyatt Mage, FNP  gabapentin (NEURONTIN) 300 MG capsule TAKE 1 BY MOUTH EVERY DAY IN THE MORNING, 1 AT MID DAY, THEN 2 NIGHTLY Patient taking differently: TAKE 1 BY MOUTH EVERY DAY IN THE MORNING, 2 AT MID DAY, THEN 1 NIGHTLY 07/06/23  Yes Dugal, Tabitha, FNP  mesalamine (APRISO) 0.375 g 24 hr capsule Take 375 mg by mouth daily.  Yes [provider]  mirtazapine (REMERON) 30 MG tablet Take 30 mg by mouth at bedtime. 10/12/22  Yes [provider]  pravastatin (PRAVACHOL) 40 MG tablet TAKE 1 TABLET (40 MG TOTAL) BY MOUTH DAILY WITH LUNCH. 05/26/22  Yes Malva Limes, MD  tamsulosin (FLOMAX) 0.4 MG CAPS capsule Take 0.4 mg by mouth daily.   Yes [provider]  valsartan-hydrochlorothiazide (DIOVAN-HCT) 80-12.5 MG tablet TAKE 1 TABLET BY MOUTH EVERY DAY Patient taking differently: Take 0.5 tablets by mouth daily. 12/22/22  Yes Mort Sawyers, FNP  venlafaxine XR (EFFEXOR-XR) 150 MG 24 hr capsule Take 150 mg by mouth daily with breakfast.   Yes [provider]  Vitamin D, Cholecalciferol, 1000 units TABS Take 2,000 Units by mouth daily.   Yes [provider]  ALEVAZOL 1 % OINT APPLY 1 APPLICATION TOPICALLY 2 (TWO) TIMES DAILY FOR 14 DAYS. Patient not taking: Reported on 08/16/2023 03/29/23   Mort Sawyers, FNP  Blood Glucose Monitoring Suppl (CONTOUR  NEXT ONE) KIT Use to check blood sugar daily 07/05/23   Mort Sawyers, FNP  glucose blood (CONTOUR NEXT TEST) test strip Use to check blood sugar daily 07/05/23   Mort Sawyers, FNP  metFORMIN (GLUCOPHAGE-XR) 500 MG 24 hr tablet TAKE 2 TABLETS BY MOUTH TWICE A DAY Patient taking differently: 1,000 mg daily with lunch. 02/19/22   Malva Limes, MD  oxybutynin (DITROPAN XL) 15 MG 24 hr tablet Take 1 tablet (15 mg total) by mouth daily. Patient not taking: Reported on 08/16/2023 08/06/23   Vanna Scotland, MD      VITAL SIGNS:  Blood pressure 139/81, pulse 82, temperature 98 F (36.7 C), temperature source Oral, resp. rate 17, height 5\' 11"  (1.803 m), weight 117.9 kg, SpO2 98%.  PHYSICAL EXAMINATION:  Physical Exam  GENERAL:  72 y.o.-year-old patient lying in the bed with no acute distress.  EYES: Pupils equal, round, reactive to light and accommodation. No scleral icterus. Extraocular muscles intact.  HEENT: Head atraumatic, normocephalic. Oropharynx and nasopharynx clear.  NECK:  Supple, no jugular venous distention. No thyroid enlargement, no tenderness.  LUNGS: Normal breath sounds bilaterally, no wheezing, rales,rhonchi or crepitation. No use of accessory muscles of respiration.  CARDIOVASCULAR: Regular rate and rhythm, S1, S2 normal. No murmurs, rubs, or gallops.  She had left-sided chest wall tenderness over the left fourth through the ninth ribs. ABDOMEN: Soft, nondistended, nontender. Bowel sounds present. No organomegaly or mass.  EXTREMITIES: No pedal edema, cyanosis, or clubbing.  NEUROLOGIC: Cranial nerves II through XII are intact. Muscle strength 5/5 in all extremities. Sensation intact. Gait not checked.  PSYCHIATRIC: The patient is alert and oriented x 3.  Normal affect and good eye contact. SKIN: Left elbow skin tear that was dressed. LABORATORY PANEL:   CBC Recent Labs  Lab 08/28/23 2110  WBC 14.1*  HGB 14.5  HCT 46.3  PLT 215    ------------------------------------------------------------------------------------------------------------------  Chemistries  Recent Labs  Lab 08/28/23 2110  NA 139  K 3.6  CL 101  CO2 27  GLUCOSE 126*  BUN 21  CREATININE 1.36*  CALCIUM 9.0  AST 23  ALT 21  ALKPHOS 79  BILITOT 0.6   ------------------------------------------------------------------------------------------------------------------  Cardiac Enzymes No results for input(s): "TROPONINI" in the last 168 hours. ------------------------------------------------------------------------------------------------------------------  RADIOLOGY:  CT Chest Wo Contrast  Result Date: 08/28/2023 CLINICAL DATA:  Fall, left rib pain EXAM: CT CHEST WITHOUT CONTRAST TECHNIQUE: Multidetector CT imaging of the chest was performed following the standard protocol without IV contrast. RADIATION DOSE  REDUCTION: This exam was performed according to the departmental dose-optimization program which includes automated exposure control, adjustment of the mA and/or kV according to patient size and/or use of iterative reconstruction technique. COMPARISON:  Chest radiograph 08/28/2023 FINDINGS: Cardiovascular: Coronary, aortic arch, and branch vessel atherosclerotic vascular disease. Mediastinum/Nodes: Unremarkable Lungs/Pleura: Chronically stable and partially calcified 1.0 by 0.9 cm nodule in the right lower lobe along the bottom of the major fissure on image 95 series 4, no change from 03/06/2016, benign. Scattered bandlike atelectasis in both lower lobes. Mild atelectasis in the lingula. Upper Abdomen: Unremarkable Musculoskeletal: Degenerative glenohumeral arthropathy bilaterally. Acute fractures of the left fourth, fifth, sixth, seventh, eighth, and ninth ribs. The fifth and sixth rib fractures are segmental, with the anterior sixth rib fracture being mildly displaced. Thoracic spondylosis. IMPRESSION: 1. Acute fractures of the left fourth, fifth,  sixth, seventh, eighth, and ninth ribs. The fifth and sixth rib fractures are segmental, with the anterior sixth rib fracture being mildly displaced. 2. Scattered bandlike atelectasis in both lower lobes and in the lingula. 3. Chronically stable and partially calcified 1.0 by 0.9 cm nodule in the right lower lobe along the bottom of the major fissure, no change from 03/06/2016, benign. No further imaging workup of this lesion is indicated. 4. Coronary, aortic arch, and branch vessel atherosclerotic vascular disease. 5. Degenerative glenohumeral arthropathy bilaterally. 6. Thoracic spondylosis. 7. Aortic atherosclerosis. Aortic Atherosclerosis (ICD10-I70.0). Electronically Signed   By: Gaylyn Rong M.D.   On: 08/28/2023 20:45   DG Elbow 2 Views Left  Result Date: 08/28/2023 CLINICAL DATA:  Fall, skin tear along the left arm EXAM: LEFT ELBOW - 2 VIEW COMPARISON:  None Available. FINDINGS: Spurring of the coronoid process and sublime tubercle with mild spurring of the radial head and capitellum. There is also some spurring along the olecranon which may be chronically fragmented. No acute bony findings. No elbow joint effusion. Soft tissue irregularity along the dorsal-lateral elbow potentially from laceration/skin tear. IMPRESSION: 1. Soft tissue irregularity along the dorsal-lateral elbow potentially from laceration/skin tear. 2. No acute bony findings. 3. Degenerative spurring of the elbow. Electronically Signed   By: Gaylyn Rong M.D.   On: 08/28/2023 20:36   DG Chest 2 View  Result Date: 08/28/2023 CLINICAL DATA:  Possible rib fracture left side due to fall left arm skin tear and left-sided rib pain EXAM: CHEST - 2 VIEW COMPARISON:  05/30/2022 FINDINGS: No acute airspace disease or pleural effusion. Minimal atelectasis left base. Normal cardiac size with aortic atherosclerosis. No pneumothorax. No acute osseous abnormality. IMPRESSION: No active cardiopulmonary disease. Electronically Signed    By: Jasmine Pang M.D.   On: 08/28/2023 19:31      IMPRESSION AND PLAN:  Assessment and Plan: * Multiple rib fractures - The patient be admitted to an observation medical bed. - Pain management will be provided. - She will be monitored for flail chest.   Dyslipidemia - We will continue statin therapy.  Essential hypertension - We will continue antihypertensive therapy.  BPH (benign prostatic hyperplasia) - We will continue Flomax.  Type 2 diabetes mellitus with peripheral neuropathy (HCC) - We will place the patient on supplemental coverage with NovoLog. - We will continue Neurontin.  Anxiety and depression - We will continue Klonopin, Effexor XR and Wellbutrin XL.    DVT prophylaxis: Lovenox.  Advanced Care Planning:  Code Status: full code.  Family Communication:  The plan of care was discussed in details with the patient (and family). I answered all questions. The patient  agreed to proceed with the above mentioned plan. Further management will depend upon hospital course. Disposition Plan: Back to previous home environment Consults called: none.  All the records are reviewed and case discussed with ED provider.  Status is: Observation  I certify that at the time of admission, it is my clinical judgment that the patient will require  hospital care extending less than 2 midnights.                            Dispo: The patient is from: Home              Anticipated d/c is to: Home              Patient currently is not medically stable to d/c.              Difficult to place patient: No  Hannah Beat M.D on 08/28/2023 at 11:37 PM  [facility]   From 7 PM-7 AM, contact night-coverage [email or web address]  CC: Primary care physician; Mort Sawyers, FNP

## 2023-08-28 NOTE — Assessment & Plan Note (Signed)
- 

## 2023-08-28 NOTE — ED Provider Notes (Signed)
[facility] Provider Note    Event Date/Time   First MD Initiated Contact with Patient 08/28/23 1929     (approximate)   History   Fall   HPI  Edward King is a 72 y.o. male past medical history significant for diabetes, history of stroke, dementia, presents to the emergency department with a fall.  Patient states that he got tripped up trying to carry groceries and got off balance and fell landing on his left side.  Significant amount of pain on his left chest since that time.  No head injury or loss of consciousness.  Landed on his left arm.  Not on anticoagulation.  Denies any change in vision or extremity numbness or weakness.  Denies any neck pain.  Denies any chest pain that preceded this fall.  Tetanus is up-to-date.  Ambulatory on scene.     Physical Exam   Triage Vital Signs: ED Triage Vitals  Encounter Vitals Group     BP 08/28/23 1826 (!) 131/110     Systolic BP Percentile --      Diastolic BP Percentile --      Pulse Rate 08/28/23 1826 79     Resp 08/28/23 1826 19     Temp 08/28/23 1826 98 F (36.7 C)     Temp Source 08/28/23 1826 Oral     SpO2 08/28/23 1826 95 %     Weight 08/28/23 1827 260 lb (117.9 kg)     Height 08/28/23 1827 5\' 11"  (1.803 m)     Head Circumference --      Peak Flow --      Pain Score 08/28/23 1826 7     Pain Loc --      Pain Education --      Exclude from Growth Chart --     Most recent vital signs: Vitals:   08/28/23 1826 08/28/23 1828  BP: (!) 131/110 (!) 146/80  Pulse: 79   Resp: 19   Temp: 98 F (36.7 C)   SpO2: 95%     Physical Exam Constitutional:      Appearance: He is well-developed.  HENT:     Head: Atraumatic.  Eyes:     Conjunctiva/sclera: Conjunctivae normal.  Cardiovascular:     Rate and Rhythm: Regular rhythm.  Pulmonary:     Effort: No respiratory distress.     Comments: Significant left-sided chest wall tenderness Chest:     Chest wall: Tenderness present.  Abdominal:      Tenderness: There is no abdominal tenderness.  Musculoskeletal:     Cervical back: Normal range of motion. No tenderness.     Comments: Large skin tear to the left elbow and forearm.  Hemostatic.  No underlying bony tenderness to palpation.  No tenderness to bilateral upper extremities.  Ecchymosis to the left lower leg but no tenderness to palpation.  No tenderness to bilateral hips.  No midline cervical, thoracic or lumbar tenderness to palpation.  Skin:    General: Skin is warm.     Capillary Refill: Capillary refill takes less than 2 seconds.  Neurological:     Mental Status: He is alert. Mental status is at baseline.     IMPRESSION / MDM / ASSESSMENT AND PLAN / ED COURSE  I reviewed the triage vital signs and the nursing notes.  Differential diagnosis including rib fractures, lung contusion, pneumothorax, elbow fracture  RADIOLOGY I independently reviewed imaging, my interpretation of imaging: Chest x-ray with no obvious signs of  pneumothorax.  Read as no acute findings.  X-ray of the left elbow with no acute findings.  No acute fractures or dislocation.  Soft tissue irregularity that is consistent with his laceration/skin tear.  CT scan of the chest concerning for multiple rib fractures of rib fractures left 4-9  LABS (all labs ordered are listed, but only abnormal results are displayed) Labs interpreted as -    Labs Reviewed  CBC - Abnormal; Notable for the following components:      Result Value   WBC 14.1 (*)    All other components within normal limits  COMPREHENSIVE METABOLIC PANEL - Abnormal; Notable for the following components:   Glucose, Bld 126 (*)    Creatinine, Ser 1.36 (*)    GFR, Estimated 55 (*)    All other components within normal limits     MDM    Given p.o. Percocet without significant improvement.  Given IV morphine for multiple rib fractures with improvement of his chest pain.  Plan for lab work.  Skin tear was repaired at bedside, irrigated.   Tetanus is up-to-date.  Given multiple rib fractures consulted hospitalist for admission for pain control.   PROCEDURES:  Critical Care performed: No  Procedures  Patient's presentation is most consistent with acute presentation with potential threat to life or bodily function.   MEDICATIONS ORDERED IN ED: Medications  oxyCODONE-acetaminophen (PERCOCET/ROXICET) 5-325 MG per tablet 1 tablet (1 tablet Oral Given 08/28/23 1958)  Tdap (BOOSTRIX) injection 0.5 mL (0.5 mLs Intramuscular Given 08/28/23 1958)  morphine (PF) 4 MG/ML injection 6 mg (6 mg Intravenous Given 08/28/23 2103)    FINAL CLINICAL IMPRESSION(S) / ED DIAGNOSES   Final diagnoses:  Fall, initial encounter  Closed fracture of multiple ribs of left side, initial encounter  Skin tear of left upper arm without complication, initial encounter     Rx / DC Orders   ED Discharge Orders     None        Note:  This document was prepared using Dragon voice recognition software and may include unintentional dictation errors.   Corena Herter, MD 08/28/23 2141

## 2023-08-28 NOTE — Assessment & Plan Note (Addendum)
-   We will continue Klonopin, Effexor XR and Wellbutrin XL.

## 2023-08-28 NOTE — Assessment & Plan Note (Signed)
-   We will place the patient on supplemental coverage with NovoLog. - We will continue Neurontin.

## 2023-08-28 NOTE — Assessment & Plan Note (Signed)
-   This is secondary to accidental mechanical fall. - The patient be admitted to an observation medical bed. - Pain management will be provided. - She will be monitored for flail chest.

## 2023-08-28 NOTE — ED Triage Notes (Signed)
Pt sts that he was bringing groceries in to the house and he fell on the sidewalk. Pt sts that he has left arm skin tear and left rib pain.

## 2023-08-28 NOTE — Assessment & Plan Note (Signed)
-   We will continue statin therapy. 

## 2023-08-29 ENCOUNTER — Other Ambulatory Visit: Payer: Self-pay

## 2023-08-29 DIAGNOSIS — S2249XA Multiple fractures of ribs, unspecified side, initial encounter for closed fracture: Secondary | ICD-10-CM | POA: Diagnosis present

## 2023-08-29 DIAGNOSIS — W010XXA Fall on same level from slipping, tripping and stumbling without subsequent striking against object, initial encounter: Secondary | ICD-10-CM | POA: Diagnosis present

## 2023-08-29 DIAGNOSIS — Z8546 Personal history of malignant neoplasm of prostate: Secondary | ICD-10-CM | POA: Diagnosis not present

## 2023-08-29 DIAGNOSIS — E1142 Type 2 diabetes mellitus with diabetic polyneuropathy: Secondary | ICD-10-CM | POA: Diagnosis not present

## 2023-08-29 DIAGNOSIS — Z7982 Long term (current) use of aspirin: Secondary | ICD-10-CM | POA: Diagnosis not present

## 2023-08-29 DIAGNOSIS — Z8042 Family history of malignant neoplasm of prostate: Secondary | ICD-10-CM | POA: Diagnosis not present

## 2023-08-29 DIAGNOSIS — Z8249 Family history of ischemic heart disease and other diseases of the circulatory system: Secondary | ICD-10-CM | POA: Diagnosis not present

## 2023-08-29 DIAGNOSIS — W19XXXA Unspecified fall, initial encounter: Secondary | ICD-10-CM | POA: Diagnosis not present

## 2023-08-29 DIAGNOSIS — F0394 Unspecified dementia, unspecified severity, with anxiety: Secondary | ICD-10-CM | POA: Diagnosis not present

## 2023-08-29 DIAGNOSIS — Z841 Family history of disorders of kidney and ureter: Secondary | ICD-10-CM | POA: Diagnosis not present

## 2023-08-29 DIAGNOSIS — N400 Enlarged prostate without lower urinary tract symptoms: Secondary | ICD-10-CM | POA: Diagnosis not present

## 2023-08-29 DIAGNOSIS — Z23 Encounter for immunization: Secondary | ICD-10-CM | POA: Diagnosis not present

## 2023-08-29 DIAGNOSIS — Z888 Allergy status to other drugs, medicaments and biological substances status: Secondary | ICD-10-CM | POA: Diagnosis not present

## 2023-08-29 DIAGNOSIS — Z800 Family history of malignant neoplasm of digestive organs: Secondary | ICD-10-CM | POA: Diagnosis not present

## 2023-08-29 DIAGNOSIS — Z7985 Long-term (current) use of injectable non-insulin antidiabetic drugs: Secondary | ICD-10-CM | POA: Diagnosis not present

## 2023-08-29 DIAGNOSIS — S2242XA Multiple fractures of ribs, left side, initial encounter for closed fracture: Secondary | ICD-10-CM | POA: Diagnosis not present

## 2023-08-29 DIAGNOSIS — Z961 Presence of intraocular lens: Secondary | ICD-10-CM | POA: Diagnosis not present

## 2023-08-29 DIAGNOSIS — I10 Essential (primary) hypertension: Secondary | ICD-10-CM

## 2023-08-29 DIAGNOSIS — E785 Hyperlipidemia, unspecified: Secondary | ICD-10-CM | POA: Diagnosis not present

## 2023-08-29 DIAGNOSIS — Z8673 Personal history of transient ischemic attack (TIA), and cerebral infarction without residual deficits: Secondary | ICD-10-CM | POA: Diagnosis not present

## 2023-08-29 DIAGNOSIS — S41112A Laceration without foreign body of left upper arm, initial encounter: Secondary | ICD-10-CM | POA: Diagnosis not present

## 2023-08-29 DIAGNOSIS — Z9841 Cataract extraction status, right eye: Secondary | ICD-10-CM | POA: Diagnosis not present

## 2023-08-29 DIAGNOSIS — J9811 Atelectasis: Secondary | ICD-10-CM | POA: Diagnosis not present

## 2023-08-29 DIAGNOSIS — Z79899 Other long term (current) drug therapy: Secondary | ICD-10-CM | POA: Diagnosis not present

## 2023-08-29 DIAGNOSIS — F32A Depression, unspecified: Secondary | ICD-10-CM | POA: Diagnosis not present

## 2023-08-29 DIAGNOSIS — Z9842 Cataract extraction status, left eye: Secondary | ICD-10-CM | POA: Diagnosis not present

## 2023-08-29 DIAGNOSIS — Y929 Unspecified place or not applicable: Secondary | ICD-10-CM | POA: Diagnosis not present

## 2023-08-29 DIAGNOSIS — F0393 Unspecified dementia, unspecified severity, with mood disturbance: Secondary | ICD-10-CM | POA: Diagnosis not present

## 2023-08-29 LAB — GLUCOSE, CAPILLARY
Glucose-Capillary: 118 mg/dL — ABNORMAL HIGH (ref 70–99)
Glucose-Capillary: 139 mg/dL — ABNORMAL HIGH (ref 70–99)
Glucose-Capillary: 144 mg/dL — ABNORMAL HIGH (ref 70–99)
Glucose-Capillary: 154 mg/dL — ABNORMAL HIGH (ref 70–99)
Glucose-Capillary: 187 mg/dL — ABNORMAL HIGH (ref 70–99)

## 2023-08-29 LAB — BASIC METABOLIC PANEL
Anion gap: 9 (ref 5–15)
BUN: 22 mg/dL (ref 8–23)
CO2: 29 mmol/L (ref 22–32)
Calcium: 8.4 mg/dL — ABNORMAL LOW (ref 8.9–10.3)
Chloride: 100 mmol/L (ref 98–111)
Creatinine, Ser: 1.22 mg/dL (ref 0.61–1.24)
GFR, Estimated: >60 mL/min (ref >60)
Glucose, Bld: 154 mg/dL — ABNORMAL HIGH (ref 70–99)
Potassium: 4 mmol/L (ref 3.5–5.1)
Sodium: 138 mmol/L (ref 135–145)

## 2023-08-29 LAB — CBC
HCT: 40 % (ref 39.0–52.0)
Hemoglobin: 12.8 g/dL — ABNORMAL LOW (ref 13.0–17.0)
MCH: 28.2 pg (ref 26.0–34.0)
MCHC: 32 g/dL (ref 30.0–36.0)
MCV: 88.1 fL (ref 80.0–100.0)
Platelets: 193 10*3/uL (ref 150–400)
RBC: 4.54 MIL/uL (ref 4.22–5.81)
RDW: 13.2 % (ref 11.5–15.5)
WBC: 10.2 10*3/uL (ref 4.0–10.5)
nRBC: 0 % (ref 0.0–0.2)

## 2023-08-29 MED ORDER — IRBESARTAN 150 MG PO TABS
75.0000 mg | ORAL_TABLET | Freq: Every day | ORAL | Status: DC
  Administered 2023-08-29 – 2023-08-30 (×2): 75 mg via ORAL
  Filled 2023-08-29 (×2): qty 1

## 2023-08-29 MED ORDER — GABAPENTIN 300 MG PO CAPS
300.0000 mg | ORAL_CAPSULE | Freq: Two times a day (BID) | ORAL | Status: DC
  Administered 2023-08-29 – 2023-08-30 (×4): 300 mg via ORAL
  Filled 2023-08-29 (×4): qty 1

## 2023-08-29 MED ORDER — HYDROCHLOROTHIAZIDE 12.5 MG PO TABS
12.5000 mg | ORAL_TABLET | Freq: Every day | ORAL | Status: DC
  Administered 2023-08-29 – 2023-08-30 (×2): 12.5 mg via ORAL
  Filled 2023-08-29 (×3): qty 1

## 2023-08-29 MED ORDER — INFLUENZA VAC A&B SURF ANT ADJ 0.5 ML IM SUSY
0.5000 mL | PREFILLED_SYRINGE | INTRAMUSCULAR | Status: AC
  Administered 2023-08-30: 0.5 mL via INTRAMUSCULAR
  Filled 2023-08-29: qty 0.5

## 2023-08-29 NOTE — Progress Notes (Signed)
Anticoagulation monitoring(Lovenox):  72 yo male ordered Lovenox 40 mg Q24h    Filed Weights   08/28/23 1827  Weight: 117.9 kg (260 lb)   BMI 36.3    Lab Results  Component Value Date   CREATININE 1.36 (H) 08/28/2023   CREATININE 1.20 12/21/2022   CREATININE 1.28 09/13/2022   Estimated Creatinine Clearance: 64.1 mL/min (A) (by C-G formula based on SCr of 1.36 mg/dL (H)). Hemoglobin & Hematocrit     Component Value Date/Time   HGB 14.5 08/28/2023 2110   HGB 13.6 05/05/2022 1529   HCT 46.3 08/28/2023 2110   HCT 41.6 05/05/2022 1529     Per Protocol for Patient with estCrcl > 30 ml/min and BMI > 30, will transition to Lovenox 60 mg Q24h.

## 2023-08-29 NOTE — Progress Notes (Signed)
Progress Note   Patient: Edward King:[id number] DOB: 1951-10-22 DOA: 08/28/2023     0 DOS: the patient was seen and examined on 08/29/2023     Subjective:  Patient seen and examined at bedside this morning He admits to improvement in his generalized weakness Denies any chest pain however did have some tenderness with palpation to the left chest   Brief hospital course: From HPI "Edward King is a 72 y.o. male with medical history significant for osteoarthritis, type 2 diabetes mellitus, depression, hypertension and CVA, who presented to the emergency room with acute onset of accidental mechanical fall.  The patient was turning around and apparently lost balance when he fell on his left side with subsequent left elbow and chest wall pain.  He denied any paresthesias or focal muscle weakness.  No presyncope or syncope.  No fever or chills.  No nausea or vomiting or abdominal pain.  No cough or wheezing or hemoptysis.  In the emergency room CT scan of the chest showed multiple acute fracture of the left fourth, fifth, sixth, seventh, eighth and ninth ribs.  As well as associated atelectasis.  Calcified 1 x 0.9 cm nodule seen on the right lower lobe which has remained unchanged compared to the one obtained in March 2017.  Appears benign.  "   Assessment and Plan:  Multiple rib fractures - This is secondary to accidental mechanical fall. Continue to monitor respiratory function - Continue current pain management - She will be monitored for flail chest. Continue PT OT     Dyslipidemia - Continue statin therapy.   Essential hypertension - We will continue antihypertensive therapy.   BPH (benign prostatic hyperplasia) Continue Flomax.   Type 2 diabetes mellitus with peripheral neuropathy (HCC) - We will place the patient on supplemental coverage with NovoLog. - Continue Neurontin   Anxiety and depression - Continue Klonopin, Effexor XR and Wellbutrin XL.       DVT  prophylaxis: Lovenox.   Advanced Care Planning:  Code Status: full code.   Family Communication: No family present at bedside today . Disposition Plan: Back to previous home environment  Consults called: none.  All the records are reviewed and case discussed with ED provider.   Status is: Inpatient status as patient continues to need pain management as well as PT OT evaluation   Physical Exam: GENERAL:  72 y.o.-year-old patient lying in the bed with no acute distress.  EYES: Pupils equal, round, reactive to light and accommodation.  HEENT: Head atraumatic, normocephalic.  NECK:  Supple, no jugular venous distention. LUNGS: Normal breath sounds bilaterally CARDIOVASCULAR: Regular rate and rhythm, S1, S2 normal. No murmurs, rubs, or gallops.  She had left-sided chest wall tenderness over the left fourth through the ninth ribs. ABDOMEN: Soft, nondistended, nontender. Bowel sounds present. No organomegaly or mass.  EXTREMITIES: No pedal edema, cyanosis, or clubbing.  NEUROLOGIC: Cranial nerves II through XII are intact. Muscle strength 5/5 in all extremities. Sensation intact. Gait not checked.  PSYCHIATRIC: The patient is alert and oriented x 3.   Vitals:   08/28/23 1828 08/28/23 2225 08/29/23 0000 08/29/23 1536  BP: (!) 146/80 139/81 115/66 (!) 140/69  Pulse:  82 86 86  Resp:  17 16 18   Temp:   97.7 F (36.5 C) 98.3 F (36.8 C)  TempSrc:      SpO2:  98% 96% 90%  Weight:      Height:        Data Reviewed: I have reviewed  the patient's CT scan of the chest that showed above findings I have also reviewed patient's CBC as well as BMP results as shown below    Latest Ref Rng & Units 08/29/2023    5:34 AM 08/28/2023    9:10 PM 09/13/2022   12:00 PM  CBC  WBC 4.0 - 10.5 K/uL 10.2  14.1  7.2   Hemoglobin 13.0 - 17.0 g/dL 70.6  23.7  62.8   Hematocrit 39.0 - 52.0 % 40.0  46.3  42.2   Platelets 150 - 400 K/uL 193  215  227.0        Latest Ref Rng & Units 08/29/2023    5:34 AM  08/28/2023    9:10 PM 12/21/2022   12:10 PM  BMP  Glucose 70 - 99 mg/dL 315  176  160   BUN 8 - 23 mg/dL 22  21  21    Creatinine 0.61 - 1.24 mg/dL 7.37  1.06  2.69   Sodium 135 - 145 mmol/L 138  139  140   Potassium 3.5 - 5.1 mmol/L 4.0  3.6  4.4   Chloride 98 - 111 mmol/L 100  101  100   CO2 22 - 32 mmol/L 29  27  34   Calcium 8.9 - 10.3 mg/dL 8.4  9.0  9.5      Author: Loyce Dys, MD 08/29/2023 4:27 PM  For on call review [email or web address].

## 2023-08-29 NOTE — Plan of Care (Signed)
  Problem: Health Behavior/Discharge Planning: Goal: Ability to identify and utilize available resources and services will improve Outcome: Progressing Goal: Ability to manage health-related needs will improve Outcome: Progressing   Problem: Metabolic: Goal: Ability to maintain appropriate glucose levels will improve Outcome: Progressing   Problem: Nutritional: Goal: Maintenance of adequate nutrition will improve Outcome: Progressing Goal: Progress toward achieving an optimal weight will improve Outcome: Progressing   Problem: Skin Integrity: Goal: Risk for impaired skin integrity will decrease Outcome: Progressing

## 2023-08-29 NOTE — Progress Notes (Signed)
PHARMACIST - PHYSICIAN ORDER COMMUNICATION  CONCERNING: P&T Medication Policy on Herbal Medications  DESCRIPTION:  This patient's order for:  Co Q-10 capsules  has been noted.  This product(s) is classified as an "herbal" or natural product. Due to a lack of definitive safety studies or FDA approval, nonstandard manufacturing practices, plus the potential risk of unknown drug-drug interactions while on inpatient medications, the Pharmacy and Therapeutics Committee does not permit the use of "herbal" or natural products of this type within [facility].   ACTION TAKEN: The pharmacy department is unable to verify this order at this time and your patient has been informed of this safety policy. Please reevaluate patient's clinical condition at discharge and address if the herbal or natural product(s) should be resumed at that time.

## 2023-08-29 NOTE — Plan of Care (Signed)
  Problem: Coping: Goal: Ability to adjust to condition or change in health will improve Outcome: Progressing   Problem: Fluid Volume: Goal: Ability to maintain a balanced intake and output will improve Outcome: Progressing   Problem: Health Behavior/Discharge Planning: Goal: Ability to manage health-related needs will improve Outcome: Progressing   Problem: Metabolic: Goal: Ability to maintain appropriate glucose levels will improve Outcome: Progressing   Problem: Tissue Perfusion: Goal: Adequacy of tissue perfusion will improve Outcome: Progressing   Problem: Education: Goal: Knowledge of General Education information will improve Description: Including pain rating scale, medication(s)/side effects and non-pharmacologic comfort measures Outcome: Progressing   Problem: Clinical Measurements: Goal: Ability to maintain clinical measurements within normal limits will improve Outcome: Progressing Goal: Will remain free from infection Outcome: Progressing Goal: Diagnostic test results will improve Outcome: Progressing Goal: Respiratory complications will improve Outcome: Progressing Goal: Cardiovascular complication will be avoided Outcome: Progressing   Problem: Activity: Goal: Risk for activity intolerance will decrease Outcome: Progressing   Problem: Nutrition: Goal: Adequate nutrition will be maintained Outcome: Progressing   Problem: Coping: Goal: Level of anxiety will decrease Outcome: Progressing   Problem: Elimination: Goal: Will not experience complications related to bowel motility Outcome: Progressing Goal: Will not experience complications related to urinary retention Outcome: Progressing   Problem: Pain Managment: Goal: General experience of comfort will improve Outcome: Progressing   Problem: Safety: Goal: Ability to remain free from injury will improve Outcome: Progressing   Problem: Skin Integrity: Goal: Risk for impaired skin integrity will  decrease Outcome: Progressing

## 2023-08-30 DIAGNOSIS — S2242XA Multiple fractures of ribs, left side, initial encounter for closed fracture: Secondary | ICD-10-CM | POA: Diagnosis not present

## 2023-08-30 DIAGNOSIS — S41112A Laceration without foreign body of left upper arm, initial encounter: Secondary | ICD-10-CM | POA: Diagnosis not present

## 2023-08-30 DIAGNOSIS — W19XXXA Unspecified fall, initial encounter: Secondary | ICD-10-CM | POA: Diagnosis not present

## 2023-08-30 LAB — CBC WITH DIFFERENTIAL/PLATELET
Abs Immature Granulocytes: 0.04 10*3/uL (ref 0.00–0.07)
Basophils Absolute: 0 10*3/uL (ref 0.0–0.1)
Basophils Relative: 0 %
Eosinophils Absolute: 0.1 10*3/uL (ref 0.0–0.5)
Eosinophils Relative: 1 %
HCT: 41.3 % (ref 39.0–52.0)
Hemoglobin: 13 g/dL (ref 13.0–17.0)
Immature Granulocytes: 0 %
Lymphocytes Relative: 11 %
Lymphs Abs: 1.1 10*3/uL (ref 0.7–4.0)
MCH: 28.6 pg (ref 26.0–34.0)
MCHC: 31.5 g/dL (ref 30.0–36.0)
MCV: 90.8 fL (ref 80.0–100.0)
Monocytes Absolute: 0.7 10*3/uL (ref 0.1–1.0)
Monocytes Relative: 7 %
Neutro Abs: 8 10*3/uL — ABNORMAL HIGH (ref 1.7–7.7)
Neutrophils Relative %: 81 %
Platelets: 190 10*3/uL (ref 150–400)
RBC: 4.55 MIL/uL (ref 4.22–5.81)
RDW: 13.2 % (ref 11.5–15.5)
WBC: 10 10*3/uL (ref 4.0–10.5)
nRBC: 0 % (ref 0.0–0.2)

## 2023-08-30 LAB — BASIC METABOLIC PANEL
Anion gap: 8 (ref 5–15)
BUN: 16 mg/dL (ref 8–23)
CO2: 30 mmol/L (ref 22–32)
Calcium: 8.3 mg/dL — ABNORMAL LOW (ref 8.9–10.3)
Chloride: 99 mmol/L (ref 98–111)
Creatinine, Ser: 1.26 mg/dL — ABNORMAL HIGH (ref 0.61–1.24)
GFR, Estimated: >60 mL/min (ref >60)
Glucose, Bld: 150 mg/dL — ABNORMAL HIGH (ref 70–99)
Potassium: 3.6 mmol/L (ref 3.5–5.1)
Sodium: 137 mmol/L (ref 135–145)

## 2023-08-30 LAB — GLUCOSE, CAPILLARY
Glucose-Capillary: 152 mg/dL — ABNORMAL HIGH (ref 70–99)
Glucose-Capillary: 152 mg/dL — ABNORMAL HIGH (ref 70–99)

## 2023-08-30 MED ORDER — FLUMAZENIL 0.5 MG/5ML IV SOLN
INTRAVENOUS | Status: AC
  Filled 2023-08-30: qty 5

## 2023-08-30 MED ORDER — OXYCODONE-ACETAMINOPHEN 5-325 MG PO TABS: 1.0000 | ORAL_TABLET | ORAL | 0 refills | Status: DC | PRN

## 2023-08-30 MED ORDER — ACETAMINOPHEN 325 MG PO TABS: 650.0000 mg | ORAL_TABLET | Freq: Four times a day (QID) | ORAL | 0 refills | Status: DC | PRN

## 2023-08-30 NOTE — Evaluation (Signed)
Physical Therapy Evaluation Patient Details Name: Edward King MRN: [id number] DOB: May 30, 1951 Today's Date: 08/30/2023  History of Present Illness  Pt is a 72 y.o. male presenting to hospital 08/28/23 s/p fall (tripped carrying groceries).  Imaging showing L rib fx's 4-9th.  Pt admitted with multiple rib fx's.  PMH includes DM, h/o stroke, dementia.  Clinical Impression  Prior to recent fall, pt was modified independent ambulating with cane; lives with his wife.  4-5/10 L rib pain reported during session (nurse notified).  Currently pt is min assist semi-supine to sitting EOB; CGA with transfers; and CGA to ambulate 240 feet with RW use (pt reporting taking short steps d/t L rib pain).  Pt's HR 90-116 bpm and SpO2 sats 94% or greater on room air during sessions activities.   Pt would currently benefit from skilled PT to address noted impairments and functional limitations (see below for any additional details).  Upon hospital discharge, pt would benefit from ongoing therapy.     If plan is discharge home, recommend the following: A little help with walking and/or transfers;A little help with bathing/dressing/bathroom;Assistance with cooking/housework;Assist for transportation;Help with stairs or ramp for entrance   Can travel by private vehicle    Yes    Equipment Recommendations Rolling walker (2 wheels) (pt reports having RW at home already)  Recommendations for Other Services       Functional Status Assessment Patient has had a recent decline in their functional status and demonstrates the ability to make significant improvements in function in a reasonable and predictable amount of time.     Precautions / Restrictions Precautions Precautions: Fall Precaution Comments: L elbow skin tear Restrictions Weight Bearing Restrictions: No      Mobility  Bed Mobility Overal bed mobility: Needs Assistance Bed Mobility: Supine to Sit     Supine to sit: Min assist, HOB elevated      General bed mobility comments: assist for trunk; vc's for technique    Transfers Overall transfer level: Needs assistance Equipment used: Rolling walker (2 wheels) Transfers: Sit to/from Stand Sit to Stand: Contact guard assist           General transfer comment: x1 trial from bed and x1 trial from recliner; initial vc's for UE positioning    Ambulation/Gait Ambulation/Gait assistance: Contact guard assist Gait Distance (Feet): 240 Feet Assistive device: Rolling walker (2 wheels)   Gait velocity: decreased     General Gait Details: short steps (pt reports d/t rib pain); steady with RW use  Stairs            Wheelchair Mobility     Tilt Bed    Modified Rankin (Stroke Patients Only)       Balance Overall balance assessment: Needs assistance Sitting-balance support: No upper extremity supported, Feet supported Sitting balance-Leahy Scale: Good Sitting balance - Comments: steady reaching within BOS   Standing balance support: No upper extremity supported Standing balance-Leahy Scale: Good Standing balance comment: steady reaching within BOS                             Pertinent Vitals/Pain Pain Assessment Pain Assessment: 0-10 Pain Score: 4  Pain Location: L rib fx's Pain Descriptors / Indicators: Aching, Tender, Sore Pain Intervention(s): Limited activity within patient's tolerance, Monitored during session, Premedicated before session, Repositioned    Home Living Family/patient expects to be discharged to:: Private residence Living Arrangements: Spouse/significant other Available Help at Discharge: Family Type of  Home: House Home Access: Stairs to enter;Ramped entrance   Entrance Stairs-Number of Steps: 3 STE B railing or no STE   Home Layout: One level Home Equipment: Agricultural consultant (2 wheels);Cane - single point;Other (comment);Grab bars - tub/shower;Shower seat (Adjustable bed)      Prior Function Prior Level of Function :  Independent/Modified Independent             Mobility Comments: Modified independent ambulating with SPC.  No other recent falls reported.       Extremity/Trunk Assessment   Upper Extremity Assessment Upper Extremity Assessment: Overall WFL for tasks assessed    Lower Extremity Assessment Lower Extremity Assessment: Generalized weakness    Cervical / Trunk Assessment Cervical / Trunk Assessment: Normal  Communication   Communication Communication: Hearing impairment (HOH) Cueing Techniques: Verbal cues  Cognition Arousal: Alert Behavior During Therapy: WFL for tasks assessed/performed Overall Cognitive Status: Within Functional Limits for tasks assessed                                 General Comments: Oriented to at least person, place, and general situation.        General Comments General comments (skin integrity, edema, etc.): L elbow dressing in place.  Nursing cleared pt for participation in physical therapy.  Pt agreeable to PT session.    Exercises     Assessment/Plan    PT Assessment Patient needs continued PT services  PT Problem List Decreased strength;Decreased activity tolerance;Decreased balance;Decreased mobility;Decreased knowledge of use of DME;Decreased knowledge of precautions;Decreased skin integrity;Pain       PT Treatment Interventions DME instruction;Gait training;Stair training;Functional mobility training;Therapeutic activities;Therapeutic exercise;Balance training;Patient/family education    PT Goals (Current goals can be found in the Care Plan section)  Acute Rehab PT Goals Patient Stated Goal: to improve pain and mobility PT Goal Formulation: With patient Time For Goal Achievement: 09/13/23 Potential to Achieve Goals: Good    Frequency Min 1X/week     Co-evaluation               AM-PAC PT "6 Clicks" Mobility  Outcome Measure Help needed turning from your back to your side while in a flat bed without  using bedrails?: None Help needed moving from lying on your back to sitting on the side of a flat bed without using bedrails?: A Little Help needed moving to and from a bed to a chair (including a wheelchair)?: A Little Help needed standing up from a chair using your arms (e.g., wheelchair or bedside chair)?: A Little Help needed to walk in hospital room?: A Little Help needed climbing 3-5 steps with a railing? : A Little 6 Click Score: 19    End of Session Equipment Utilized During Treatment: Gait belt Activity Tolerance: Patient tolerated treatment well Patient left: in chair;with call bell/phone within reach;with chair alarm set;with nursing/sitter in room Nurse Communication: Mobility status;Precautions;Other (comment) (Pt's pain status) PT Visit Diagnosis: Other abnormalities of gait and mobility (R26.89);Muscle weakness (generalized) (M62.81);History of falling (Z91.81);Pain Pain - Right/Left: Left Pain - part of body:  (ribs)    Time: 4166-0630 PT Time Calculation (min) (ACUTE ONLY): 34 min   Charges:   PT Evaluation $PT Eval Low Complexity: 1 Low PT Treatments $Therapeutic Activity: 8-22 mins PT General Charges $$ ACUTE PT VISIT: 1 Visit        Hendricks Limes, PT 08/30/23, 12:07 PM

## 2023-08-30 NOTE — TOC Transition Note (Signed)
Transition of Care [facility]) - CM/SW Discharge Note   Patient Details  Name: Edward King MRN: [id number] Date of Birth: 1950/12/28  [facility] of [facility] CM/SW Contact:  Garret Reddish, RN Phone Number: 08/30/2023, 2:28 PM   Clinical Narrative:     Chart reviewed.  Noted that patient has orders for discharge today.    I have spoken with Mrs. Wagner and Mr. Muise about PT's recommendation for Home Health PT.  Mr. Tona did not have a Home Health agency preference.  I have asked Kandee Keen with [facility] to service patient for home health PT.    Patient reports that he has a cane an a rolling walker at home.    I have informed staff nurse of the above information.    Final next level of care: Home w Home Health Services Barriers to Discharge: No Barriers Identified   Patient Goals and CMS Choice CMS Medicare.gov Compare Post Acute Care list provided to:: Patient Choice offered to / list presented to : Patient  Discharge Placement                    Name of family member notified: Mrs. Garbacz Patient and family notified of of transfer: 08/30/23  Discharge Plan and Services Additional resources added to the After Visit Summary for                  DME Arranged:  (Patient has all needed DME at home.  Patient has walker and a cane at home.)         HH Arranged: PT HH Agency: [facility] Date [facility] Agency Contacted: 08/30/23 Time HH Agency Contacted: 1200 Representative spoke with at [facility] Agency: Kandee Keen  Social Determinants of Health (SDOH) Interventions SDOH Screenings   Food Insecurity: No Food Insecurity (08/28/2023)  Housing: Low Risk  (08/28/2023)  Transportation Needs: No Transportation Needs (08/28/2023)  Utilities: Not At Risk (08/28/2023)  Alcohol Screen: Low Risk  (08/16/2023)  Depression (PHQ2-9): Medium Risk (08/16/2023)  Financial Resource Strain: Low Risk  (08/16/2023)  Physical Activity: Inactive (08/16/2023)  Social Connections: Moderately Integrated  (08/16/2023)  Stress: No Stress Concern Present (08/16/2023)  Tobacco Use: Low Risk  (08/28/2023)  Health Literacy: Adequate Health Literacy (08/16/2023)     Readmission Risk Interventions     No data to display

## 2023-08-30 NOTE — Discharge Summary (Signed)
Physician Discharge Summary   Patient: Edward King MRN: [id number] DOB: [birth year]  Admit date:     08/28/2023  Discharge date: 08/30/23  Discharge Physician: Loyce Dys   PCP: Mort Sawyers, FNP   Recommendations at discharge:    Discharge Diagnoses: Multiple rib fractures Dyslipidemia Essential hypertension BPH (benign prostatic hyperplasia) Type 2 diabetes mellitus with peripheral neuropathy (HCC) Anxiety and depression   Hospital Course: Edward King is a 72 y.o. male with medical history significant for osteoarthritis, type 2 diabetes mellitus, depression, hypertension and CVA, who presented to the emergency room with acute onset of accidental mechanical fall.  The patient was turning around and apparently lost balance when he fell on his left side with subsequent left elbow and chest wall pain.  He denied any paresthesias or focal muscle weakness.  No presyncope or syncope.  No fever or chills.  No nausea or vomiting or abdominal pain.  No cough or wheezing or hemoptysis.  In the emergency room CT scan of the chest showed multiple acute fracture of the left fourth, fifth, sixth, seventh, eighth and ninth ribs.  As well as associated atelectasis.  Calcified 1 x 0.9 cm nodule seen on the right lower lobe which has remained unchanged compared to the one obtained in March 2017.  Appears benign debilitate does not recommend any repeat imaging.  Patient was seen by physical therapist and able to move about with no complaints and therefore being discharged home with home health.  Consultants: None Procedures performed: None Disposition: Home Diet recommendation:  Cardiac diet DISCHARGE MEDICATION: Allergies as of 08/30/2023       Reactions   Jardiance [empagliflozin] Other (See Comments)   balanitis        Medication List     TAKE these medications    acetaminophen 325 MG tablet Commonly known as: TYLENOL Take 2 tablets (650 mg total) by mouth every 6 (six) hours  as needed for mild pain (or Fever >/= 101).   Alevazol 1 % Oint Generic drug: Clotrimazole APPLY 1 APPLICATION TOPICALLY 2 (TWO) TIMES DAILY FOR 14 DAYS.   aspirin EC 81 MG tablet Take 1 tablet (81 mg total) by mouth daily. Swallow whole.   buPROPion 150 MG 12 hr tablet Commonly known as: WELLBUTRIN SR Take 150 mg by mouth daily.   clonazePAM 0.5 MG tablet Commonly known as: KLONOPIN Take 0.5 mg by mouth 3 (three) times daily as needed for anxiety.   Contour Next One Kit Use to check blood sugar daily   Contour Next Test test strip Generic drug: glucose blood Use to check blood sugar daily   CoQ-10 100 MG Caps Take 100 mg by mouth daily.   donepezil 5 MG tablet Commonly known as: ARICEPT Take 5 mg by mouth every morning.   gabapentin 300 MG capsule Commonly known as: NEURONTIN TAKE 1 BY MOUTH EVERY DAY IN THE MORNING, 1 AT MID DAY, THEN 2 NIGHTLY What changed: See the new instructions.   mesalamine 0.375 g 24 hr capsule Commonly known as: APRISO Take 375 mg by mouth daily.   metFORMIN 500 MG 24 hr tablet Commonly known as: GLUCOPHAGE-XR TAKE 2 TABLETS BY MOUTH TWICE A DAY What changed:  how to take this when to take this   mirtazapine 30 MG tablet Commonly known as: REMERON Take 30 mg by mouth at bedtime.   oxybutynin 15 MG 24 hr tablet Commonly known as: DITROPAN XL Take 1 tablet (15 mg total) by mouth daily.  oxyCODONE-acetaminophen 5-325 MG tablet Commonly known as: PERCOCET/ROXICET Take 1-2 tablets by mouth every 4 (four) hours as needed for moderate pain or severe pain.   pravastatin 40 MG tablet Commonly known as: PRAVACHOL TAKE 1 TABLET (40 MG TOTAL) BY MOUTH DAILY WITH LUNCH.   tamsulosin 0.4 MG Caps capsule Commonly known as: FLOMAX Take 0.4 mg by mouth daily.   Trulicity 1.5 MG/0.5ML Sopn Generic drug: Dulaglutide Inject 1.5 mg into the skin once a week.   valsartan-hydrochlorothiazide 80-12.5 MG tablet Commonly known as:  DIOVAN-HCT TAKE 1 TABLET BY MOUTH EVERY DAY What changed: how much to take   venlafaxine XR 150 MG 24 hr capsule Commonly known as: EFFEXOR-XR Take 150 mg by mouth daily with breakfast.   VITAMIN B-12 PO Take 1,000 mcg by mouth daily at 6 (six) AM.   Vitamin D (Cholecalciferol) 25 MCG (1000 UT) Tabs Take 2,000 Units by mouth daily.        Discharge Exam: Filed Weights   08/28/23 1827  Weight: 117.9 kg   GENERAL:  72 y.o.-year-old patient lying in the bed with no acute distress.  EYES: Pupils equal, round, reactive to light and accommodation.  HEENT: Head atraumatic, normocephalic.  NECK:  Supple, no jugular venous distention. LUNGS: Normal breath sounds bilaterally CARDIOVASCULAR: Regular rate and rhythm, S1, S2 normal. No murmurs, rubs, or gallops.  Left-sided chest wall tenderness improved ABDOMEN: Soft, nondistended, nontender. Bowel sounds present. No organomegaly or mass.  EXTREMITIES: No pedal edema, cyanosis, or clubbing.  NEUROLOGIC: Cranial nerves II through XII are intact. Muscle strength 5/5 in all extremities. Sensation intact. Gait not checked.  PSYCHIATRIC: The patient is alert and oriented x 3.   Condition at discharge: good     Discharge time spent:  .  Signed: Loyce Dys, MD [facility] 08/30/2023

## 2023-09-03 ENCOUNTER — Telehealth: Payer: Self-pay | Admitting: Family

## 2023-09-03 DIAGNOSIS — Z9181 History of falling: Secondary | ICD-10-CM | POA: Diagnosis not present

## 2023-09-03 DIAGNOSIS — Z7984 Long term (current) use of oral hypoglycemic drugs: Secondary | ICD-10-CM | POA: Diagnosis not present

## 2023-09-03 DIAGNOSIS — I10 Essential (primary) hypertension: Secondary | ICD-10-CM | POA: Diagnosis not present

## 2023-09-03 DIAGNOSIS — E785 Hyperlipidemia, unspecified: Secondary | ICD-10-CM | POA: Diagnosis not present

## 2023-09-03 DIAGNOSIS — S51802D Unspecified open wound of left forearm, subsequent encounter: Secondary | ICD-10-CM | POA: Diagnosis not present

## 2023-09-03 DIAGNOSIS — F419 Anxiety disorder, unspecified: Secondary | ICD-10-CM | POA: Diagnosis not present

## 2023-09-03 DIAGNOSIS — J9811 Atelectasis: Secondary | ICD-10-CM | POA: Diagnosis not present

## 2023-09-03 DIAGNOSIS — E1142 Type 2 diabetes mellitus with diabetic polyneuropathy: Secondary | ICD-10-CM | POA: Diagnosis not present

## 2023-09-03 DIAGNOSIS — Z8673 Personal history of transient ischemic attack (TIA), and cerebral infarction without residual deficits: Secondary | ICD-10-CM | POA: Diagnosis not present

## 2023-09-03 DIAGNOSIS — Z7985 Long-term (current) use of injectable non-insulin antidiabetic drugs: Secondary | ICD-10-CM | POA: Diagnosis not present

## 2023-09-03 DIAGNOSIS — N400 Enlarged prostate without lower urinary tract symptoms: Secondary | ICD-10-CM | POA: Diagnosis not present

## 2023-09-03 DIAGNOSIS — F32A Depression, unspecified: Secondary | ICD-10-CM | POA: Diagnosis not present

## 2023-09-03 DIAGNOSIS — Z7982 Long term (current) use of aspirin: Secondary | ICD-10-CM | POA: Diagnosis not present

## 2023-09-03 DIAGNOSIS — S2242XD Multiple fractures of ribs, left side, subsequent encounter for fracture with routine healing: Secondary | ICD-10-CM | POA: Diagnosis not present

## 2023-09-03 NOTE — Telephone Encounter (Signed)
Home Health verbal orders Caller Name: Archie Patten Agency Name: [facility]  Callback number: [phone]  Requesting Skilled nursing  Reason: wound care and medication teaching  Frequency: 2x a wek for 1 week/ 1x a week for 3 weeks  Please forward to [facility] pool or providers CMA

## 2023-09-04 ENCOUNTER — Ambulatory Visit: Payer: PPO | Admitting: Urology

## 2023-09-04 ENCOUNTER — Encounter: Payer: Self-pay | Admitting: Family

## 2023-09-04 DIAGNOSIS — S2242XA Multiple fractures of ribs, left side, initial encounter for closed fracture: Secondary | ICD-10-CM

## 2023-09-04 MED ORDER — OXYCODONE-ACETAMINOPHEN 5-325 MG PO TABS: 1.0000 | ORAL_TABLET | ORAL | 0 refills | Status: AC | PRN

## 2023-09-04 NOTE — Telephone Encounter (Signed)
Spoke with Archie Patten with [facility]. Verbal orders have been given.

## 2023-09-06 ENCOUNTER — Other Ambulatory Visit: Payer: Self-pay | Admitting: Family Medicine

## 2023-09-06 DIAGNOSIS — E1149 Type 2 diabetes mellitus with other diabetic neurological complication: Secondary | ICD-10-CM

## 2023-09-07 ENCOUNTER — Telehealth: Payer: Self-pay | Admitting: Family

## 2023-09-07 NOTE — Telephone Encounter (Signed)
Home Health verbal orders Caller Name:[name]  Agency Name: [facility]  Callback number: [phone]  Requesting OT/PT/Skilled nursing/Social Work/Speech: PT  Reason: Balance and fall prevention   Frequency: 1wk 5   Please forward to [facility] pool or providers CMA

## 2023-09-10 ENCOUNTER — Ambulatory Visit (INDEPENDENT_AMBULATORY_CARE_PROVIDER_SITE_OTHER)
Admission: RE | Admit: 2023-09-10 | Discharge: 2023-09-10 | Disposition: A | Discharge status: Home / Self Care | Payer: PPO | Source: Ambulatory Visit | Attending: Family | Admitting: Family

## 2023-09-10 ENCOUNTER — Ambulatory Visit (INDEPENDENT_AMBULATORY_CARE_PROVIDER_SITE_OTHER): Payer: PPO | Admitting: Family

## 2023-09-10 ENCOUNTER — Encounter: Payer: Self-pay | Admitting: Family

## 2023-09-10 VITALS — BP 126/80 | HR 77 | Temp 97.3°F | Ht 71.0 in | Wt 281.6 lb

## 2023-09-10 DIAGNOSIS — R062 Wheezing: Secondary | ICD-10-CM

## 2023-09-10 DIAGNOSIS — R52 Pain, unspecified: Secondary | ICD-10-CM | POA: Diagnosis not present

## 2023-09-10 DIAGNOSIS — S2242XA Multiple fractures of ribs, left side, initial encounter for closed fracture: Secondary | ICD-10-CM

## 2023-09-10 DIAGNOSIS — S41112A Laceration without foreign body of left upper arm, initial encounter: Secondary | ICD-10-CM | POA: Diagnosis not present

## 2023-09-10 DIAGNOSIS — R918 Other nonspecific abnormal finding of lung field: Secondary | ICD-10-CM | POA: Diagnosis not present

## 2023-09-10 DIAGNOSIS — J90 Pleural effusion, not elsewhere classified: Secondary | ICD-10-CM | POA: Diagnosis not present

## 2023-09-10 MED ORDER — OXYCODONE-ACETAMINOPHEN 5-325 MG PO TABS
1.0000 | ORAL_TABLET | Freq: Four times a day (QID) | ORAL | 0 refills | Status: AC | PRN
Start: 2023-09-10 — End: 2023-09-15

## 2023-09-10 NOTE — Telephone Encounter (Signed)
Spoke with therapist in regards to medication management and they would like an updated medication list faxed to them. Please fax: [phone]  Gave her verbal orders ok with PT OT Skilled nursing, SW and speech.   They are mailing him an appropriate incentive spirometer.

## 2023-09-10 NOTE — Progress Notes (Unsigned)
Established Patient Office Visit  Subjective:      CC:  Chief Complaint  Patient presents with   Hospitalization Follow-up    [facility] 9/10-9/12    HPI: DECODA DELMASTRO is a 72 y.o. male presenting on 09/10/2023 for Hospitalization Follow-up [facility] 9/10-9/12) . Went to [facility] on 9/10  Discharged 9/12 with percocet 5-325 mg outpatient.  He went in for pain related to a fall. He had lost balance and fell on his left side with left elbow and chest wall pain. Ct scan with multiple acute fracture of left fourth, fifth, sixth, seventh, eighth and ninth ribs. Was not discharged on incentive spirometer but his wife states she has one that he can use. Calcified nodule right lower lobe, unchanged march 2017.   Has nurse coming in for PT and wound care nurse for left arm as well.   New complaints: Today he states still with pain, hard to move around. He is having difficulty using his CPAP due to pain from ribs, hard to puff in and out at times but no new or worsening sob or Doe. He did reach out 9/17 for the pain, and he was given a refill for oxycodone acetaminophen 5-325 however he is finding he needs to take two at a time to be able to sleep.   He does rotate two advil and one tylenol when he is taking percocet.      Social history:  Relevant past medical, surgical, family and social history reviewed and updated as indicated. Interim medical history since our last visit reviewed.  Allergies and medications reviewed and updated.  DATA REVIEWED: CHART IN EPIC     ROS: Negative unless specifically indicated above in HPI.    Current Outpatient Medications:    ALEVAZOL 1 % OINT, APPLY 1 APPLICATION TOPICALLY 2 (TWO) TIMES DAILY FOR 14 DAYS., Disp: 56.7 g, Rfl: 0   aspirin EC 81 MG tablet, Take 1 tablet (81 mg total) by mouth daily. Swallow whole., Disp: 90 tablet, Rfl: 1   Blood Glucose Monitoring Suppl (CONTOUR NEXT ONE) KIT, Use to check blood sugar daily, Disp: 1 kit, Rfl: 0    buPROPion (WELLBUTRIN SR) 150 MG 12 hr tablet, Take 150 mg by mouth daily., Disp: , Rfl:    clonazePAM (KLONOPIN) 0.5 MG tablet, Take 0.5 mg by mouth 3 (three) times daily as needed for anxiety., Disp: , Rfl:    Coenzyme Q10 (COQ-10) 100 MG CAPS, Take 100 mg by mouth daily., Disp: , Rfl:    Cyanocobalamin (VITAMIN B-12 PO), Take 1,000 mcg by mouth daily at 6 (six) AM., Disp: , Rfl:    donepezil (ARICEPT) 5 MG tablet, Take 5 mg by mouth every morning., Disp: , Rfl:    Dulaglutide (TRULICITY) 1.5 MG/0.5ML SOPN, Inject 1.5 mg into the skin once a week., Disp: 2 mL, Rfl: 2   gabapentin (NEURONTIN) 300 MG capsule, TAKE 1 BY MOUTH EVERY DAY IN THE MORNING, 1 AT MID DAY, THEN 2 NIGHTLY (Patient taking differently: TAKE 1 BY MOUTH EVERY DAY IN THE MORNING, 2 AT MID DAY, THEN 1 NIGHTLY), Disp: 120 capsule, Rfl: 2   glucose blood (CONTOUR NEXT TEST) test strip, Use to check blood sugar daily, Disp: 100 each, Rfl: 3   mesalamine (APRISO) 0.375 g 24 hr capsule, Take 375 mg by mouth daily., Disp: , Rfl:    mirtazapine (REMERON) 30 MG tablet, Take 30 mg by mouth at bedtime., Disp: , Rfl:    oxyCODONE-acetaminophen (PERCOCET/ROXICET) 5-325 MG tablet,  Take 1 tablet by mouth every 6 (six) hours as needed for up to 5 days for severe pain., Disp: 20 tablet, Rfl: 0   tamsulosin (FLOMAX) 0.4 MG CAPS capsule, Take 0.4 mg by mouth daily., Disp: , Rfl:    valsartan-hydrochlorothiazide (DIOVAN-HCT) 80-12.5 MG tablet, TAKE 1 TABLET BY MOUTH EVERY DAY (Patient taking differently: Take 0.5 tablets by mouth daily.), Disp: 90 tablet, Rfl: 1   venlafaxine XR (EFFEXOR-XR) 150 MG 24 hr capsule, Take 150 mg by mouth daily with breakfast., Disp: , Rfl:    Vitamin D, Cholecalciferol, 1000 units TABS, Take 2,000 Units by mouth daily., Disp: , Rfl:    furosemide (LASIX) 20 MG tablet, Take 1 tablet (20 mg total) by mouth daily., Disp: 30 tablet, Rfl: 3   metFORMIN (GLUCOPHAGE-XR) 500 MG 24 hr tablet, TAKE 2 TABLETS BY MOUTH TWICE A DAY,  Disp: 360 tablet, Rfl: 4   pravastatin (PRAVACHOL) 40 MG tablet, TAKE 1 TABLET (40 MG TOTAL) BY MOUTH DAILY WITH LUNCH., Disp: 90 tablet, Rfl: 3      Objective:    BP 126/80 (BP Location: Right Arm, Patient Position: Sitting, Cuff Size: Large)   Pulse 77   Temp (!) 97.3 F (36.3 C) (Temporal)   Ht 5\' 11"  (1.803 m)   Wt 281 lb 9.6 oz (127.7 kg)   SpO2 96%   BMI 39.28 kg/m   Wt Readings from Last 3 Encounters:  09/10/23 281 lb 9.6 oz (127.7 kg)  08/28/23 260 lb (117.9 kg)  07/18/23 273 lb (123.8 kg)    Physical Exam Constitutional:      General: He is not in acute distress.    Appearance: Normal appearance. He is normal weight. He is not ill-appearing, toxic-appearing or diaphoretic.  Cardiovascular:     Rate and Rhythm: Normal rate and regular rhythm.  Pulmonary:     Effort: Pulmonary effort is normal.     Breath sounds: Decreased air movement (left lower lobe) present. Examination of the left-lower field reveals wheezing. Wheezing present.  Musculoskeletal:        General: Normal range of motion.  Neurological:     General: No focal deficit present.     Mental Status: He is alert and oriented to person, place, and time. Mental status is at baseline.  Psychiatric:        Mood and Affect: Mood normal.        Behavior: Behavior normal.        Thought Content: Thought content normal.        Judgment: Judgment normal.           Assessment & Plan:  Acute pain -     oxyCODONE-Acetaminophen; Take 1 tablet by mouth every 6 (six) hours as needed for up to 5 days for severe pain.  Dispense: 20 tablet; Refill: 0 -     DG Chest 2 View; Future  Closed fracture of multiple ribs of left side, initial encounter Assessment & Plan: Pt with persistent, although slightly improving pain. Pdmp reviewed.  Advised pt caution with too much acetaminophen as percocet has tylenol.  Goal is for percocet once daily max prn pain. Will work on weaning down as ribs continue to heal.  Advised  pt to use pillow when coughing, laughing, moving, sneezing.  Encouraged incentive spirometry   Orders: -     oxyCODONE-Acetaminophen; Take 1 tablet by mouth every 6 (six) hours as needed for up to 5 days for severe pain.  Dispense: 20 tablet; Refill: 0 -  DG Chest 2 View; Future  Expiratory wheezing on left side of chest Assessment & Plan: Ddx pneumonia, pleural effusion Ordering stat cxr  Ensure use of incentive spirometry  If sudden worsening sob or chest pain go to er or call 911  Orders: -     DG Chest 2 View; Future  Skin tear of left upper arm without complication, initial encounter Assessment & Plan: Improving Continue with HH wound care.       No follow-ups on file.  Mort Sawyers, MSN, APRN, FNP-C [facility]

## 2023-09-10 NOTE — Patient Instructions (Addendum)
  With your percocet I recommend that you watch how much tylenol (acetaminophen) you are taking at one time.  Percocet for one tablet has 325 mg each tablet.  If you take only one tablet of percocet you can take along with one 500 mg acetaminophen which would you put you at 825 mg tylenol each time.   YOU SHOULD NOT GO OVER 3 G (3000 MG) TYLENOL (ACETAMNOPHEN) IN ONE DAYS TIME.    I also recommend that you take a stool softener every day as well along with your pain medication so that you can try to avoid straining and or constipation.

## 2023-09-10 NOTE — Telephone Encounter (Signed)
Update medication list has been faxed to the below number.

## 2023-09-11 ENCOUNTER — Other Ambulatory Visit: Payer: Self-pay | Admitting: Family

## 2023-09-11 DIAGNOSIS — J90 Pleural effusion, not elsewhere classified: Secondary | ICD-10-CM

## 2023-09-11 DIAGNOSIS — S51812A Laceration without foreign body of left forearm, initial encounter: Secondary | ICD-10-CM | POA: Diagnosis not present

## 2023-09-11 DIAGNOSIS — R062 Wheezing: Secondary | ICD-10-CM | POA: Insufficient documentation

## 2023-09-11 MED ORDER — FUROSEMIDE 20 MG PO TABS: 20.0000 mg | ORAL_TABLET | Freq: Every day | ORAL | 3 refills | Status: DC

## 2023-09-11 NOTE — Assessment & Plan Note (Signed)
Ddx pneumonia, pleural effusion Ordering stat cxr  Ensure use of incentive spirometry  If sudden worsening sob or chest pain go to er or call 911

## 2023-09-11 NOTE — Progress Notes (Signed)
Yes right in that 1140 hold spot.

## 2023-09-11 NOTE — Progress Notes (Signed)
Chest xray does show a small accumulation of fluid.  It is very important for pt to start using the incentive spirometer as this is likely what caused the fluid to form. Physical therapy /home health stated that they have ordered him one. Advise to start furosemide 20 meq once daily for 7 days. Have pt follow up Thursday with me. Put him in the 1140 block spot (blocked for new patient at 40 minutes) if pt is able.

## 2023-09-11 NOTE — Assessment & Plan Note (Signed)
Pt with persistent, although slightly improving pain. Pdmp reviewed.  Advised pt caution with too much acetaminophen as percocet has tylenol.  Goal is for percocet once daily max prn pain. Will work on weaning down as ribs continue to heal.  Advised pt to use pillow when coughing, laughing, moving, sneezing.  Encouraged incentive spirometry

## 2023-09-11 NOTE — Assessment & Plan Note (Signed)
Improving Continue with HH wound care.

## 2023-09-13 ENCOUNTER — Ambulatory Visit: Payer: PPO | Admitting: Family

## 2023-09-13 ENCOUNTER — Telehealth: Payer: Self-pay | Admitting: Family

## 2023-09-13 VITALS — BP 134/80 | HR 84 | Temp 97.3°F | Ht 71.0 in | Wt 281.0 lb

## 2023-09-13 DIAGNOSIS — J9811 Atelectasis: Secondary | ICD-10-CM

## 2023-09-13 DIAGNOSIS — R635 Abnormal weight gain: Secondary | ICD-10-CM | POA: Diagnosis not present

## 2023-09-13 DIAGNOSIS — R2681 Unsteadiness on feet: Secondary | ICD-10-CM

## 2023-09-13 DIAGNOSIS — Z79899 Other long term (current) drug therapy: Secondary | ICD-10-CM | POA: Diagnosis not present

## 2023-09-13 DIAGNOSIS — S2242XA Multiple fractures of ribs, left side, initial encounter for closed fracture: Secondary | ICD-10-CM

## 2023-09-13 DIAGNOSIS — D649 Anemia, unspecified: Secondary | ICD-10-CM | POA: Diagnosis not present

## 2023-09-13 DIAGNOSIS — R0989 Other specified symptoms and signs involving the circulatory and respiratory systems: Secondary | ICD-10-CM | POA: Diagnosis not present

## 2023-09-13 DIAGNOSIS — R296 Repeated falls: Secondary | ICD-10-CM

## 2023-09-13 DIAGNOSIS — S41112A Laceration without foreign body of left upper arm, initial encounter: Secondary | ICD-10-CM

## 2023-09-13 DIAGNOSIS — R29898 Other symptoms and signs involving the musculoskeletal system: Secondary | ICD-10-CM

## 2023-09-13 DIAGNOSIS — R42 Dizziness and giddiness: Secondary | ICD-10-CM

## 2023-09-13 LAB — BASIC METABOLIC PANEL
BUN: 19 mg/dL (ref 6–23)
CO2: 34 mEq/L — ABNORMAL HIGH (ref 19–32)
Calcium: 9 mg/dL (ref 8.4–10.5)
Chloride: 99 mEq/L (ref 96–112)
Creatinine, Ser: 1.14 mg/dL (ref 0.40–1.50)
GFR: 64.04 mL/min (ref >60.00)
Glucose, Bld: 129 mg/dL — ABNORMAL HIGH (ref 70–99)
Potassium: 3.7 mEq/L (ref 3.5–5.1)
Sodium: 140 mEq/L (ref 135–145)

## 2023-09-13 LAB — TSH: TSH: 1.76 u[IU]/mL (ref 0.35–5.50)

## 2023-09-13 LAB — CBC
HCT: 42.3 % (ref 39.0–52.0)
Hemoglobin: 13.5 g/dL (ref 13.0–17.0)
MCHC: 31.9 g/dL (ref 30.0–36.0)
MCV: 88.6 fl (ref 78.0–100.0)
Platelets: 318 10*3/uL (ref 150.0–400.0)
RBC: 4.78 Mil/uL (ref 4.22–5.81)
RDW: 13.7 % (ref 11.5–15.5)
WBC: 8.8 10*3/uL (ref 4.0–10.5)

## 2023-09-13 MED ORDER — AZITHROMYCIN 250 MG PO TABS
ORAL_TABLET | ORAL | 0 refills | Status: AC
Start: 2023-09-13 — End: 2023-09-18

## 2023-09-13 MED ORDER — AMOXICILLIN-POT CLAVULANATE 875-125 MG PO TABS
1.0000 | ORAL_TABLET | Freq: Two times a day (BID) | ORAL | 0 refills | Status: DC
Start: 2023-09-13 — End: 2023-10-17

## 2023-09-13 NOTE — Progress Notes (Signed)
Established Patient Office Visit  Subjective:   Patient ID: Edward King, male    DOB: March 23, 1951  Age: 72 y.o. MRN: [id number]  CC:  Chief Complaint  Patient presents with   Follow-up    HPI: Edward King is a 72 y.o. male presenting on 09/13/2023 for Follow-up  Last visit came in three days ago and with some doe and sob, CXR in office due to left lower lobe crackles, and started on furosemide 20 mg once daily. He is still taking this and does feel slightly better with some decrease in rib pain and slight improvement in breathing.   Wt Readings from Last 3 Encounters:  09/13/23 281 lb (127.5 kg)  09/10/23 281 lb 9.6 oz (127.7 kg)  08/28/23 260 lb (117.9 kg)  Unsure if there was a weight issue at visit 9/10. Pt states about one week ago at home he seems to think he was around 260? Visit with urologist 7/31 with weight at 273 pounds.  Last metabolic panel Lab Results  Component Value Date   GLUCOSE 129 (H) 09/13/2023   NA 140 09/13/2023   K 3.7 09/13/2023   CL 99 09/13/2023   CO2 34 (H) 09/13/2023   BUN 19 09/13/2023   CREATININE 1.14 09/13/2023   GFRNONAA >60 08/30/2023   CALCIUM 9.0 09/13/2023   PHOS 3.0 08/17/2020   PROT 7.0 08/28/2023   ALBUMIN 3.8 08/28/2023   LABGLOB 2.7 05/05/2022   AGRATIO 1.4 05/05/2022   BILITOT 0.6 08/28/2023   ALKPHOS 79 08/28/2023   AST 23 08/28/2023   ALT 21 08/28/2023   ANIONGAP 8 08/30/2023          ROS: Negative unless specifically indicated above in HPI.   Relevant past medical history reviewed and updated as indicated.   Allergies and medications reviewed and updated.   Current Outpatient Medications:    ALEVAZOL 1 % OINT, APPLY 1 APPLICATION TOPICALLY 2 (TWO) TIMES DAILY FOR 14 DAYS., Disp: 56.7 g, Rfl: 0   amoxicillin-clavulanate (AUGMENTIN) 875-125 MG tablet, Take 1 tablet by mouth 2 (two) times daily., Disp: 20 tablet, Rfl: 0   aspirin EC 81 MG tablet, Take 1 tablet (81 mg total) by mouth daily. Swallow whole.,  Disp: 90 tablet, Rfl: 1   azithromycin (ZITHROMAX) 250 MG tablet, Take 2 tablets on day 1, then 1 tablet daily on days 2 through 5, Disp: 6 tablet, Rfl: 0   Blood Glucose Monitoring Suppl (CONTOUR NEXT ONE) KIT, Use to check blood sugar daily, Disp: 1 kit, Rfl: 0   buPROPion (WELLBUTRIN SR) 150 MG 12 hr tablet, Take 150 mg by mouth daily., Disp: , Rfl:    clonazePAM (KLONOPIN) 0.5 MG tablet, Take 0.5 mg by mouth 3 (three) times daily as needed for anxiety., Disp: , Rfl:    Coenzyme Q10 (COQ-10) 100 MG CAPS, Take 100 mg by mouth daily., Disp: , Rfl:    Cyanocobalamin (VITAMIN B-12 PO), Take 1,000 mcg by mouth daily at 6 (six) AM., Disp: , Rfl:    donepezil (ARICEPT) 5 MG tablet, Take 5 mg by mouth every morning., Disp: , Rfl:    Dulaglutide (TRULICITY) 1.5 MG/0.5ML SOPN, Inject 1.5 mg into the skin once a week., Disp: 2 mL, Rfl: 2   furosemide (LASIX) 20 MG tablet, Take 1 tablet (20 mg total) by mouth daily., Disp: 30 tablet, Rfl: 3   gabapentin (NEURONTIN) 300 MG capsule, TAKE 1 BY MOUTH EVERY DAY IN THE MORNING, 1 AT MID DAY, THEN 2 NIGHTLY (  Patient taking differently: TAKE 1 BY MOUTH EVERY DAY IN THE MORNING, 2 AT MID DAY, THEN 1 NIGHTLY), Disp: 120 capsule, Rfl: 2   glucose blood (CONTOUR NEXT TEST) test strip, Use to check blood sugar daily, Disp: 100 each, Rfl: 3   mesalamine (APRISO) 0.375 g 24 hr capsule, Take 375 mg by mouth daily., Disp: , Rfl:    metFORMIN (GLUCOPHAGE-XR) 500 MG 24 hr tablet, TAKE 2 TABLETS BY MOUTH TWICE A DAY, Disp: 360 tablet, Rfl: 4   mirtazapine (REMERON) 30 MG tablet, Take 30 mg by mouth at bedtime., Disp: , Rfl:    pravastatin (PRAVACHOL) 40 MG tablet, TAKE 1 TABLET (40 MG TOTAL) BY MOUTH DAILY WITH LUNCH., Disp: 90 tablet, Rfl: 3   tamsulosin (FLOMAX) 0.4 MG CAPS capsule, Take 0.4 mg by mouth daily., Disp: , Rfl:    valsartan-hydrochlorothiazide (DIOVAN-HCT) 80-12.5 MG tablet, TAKE 1 TABLET BY MOUTH EVERY DAY (Patient taking differently: Take 0.5 tablets by mouth  daily.), Disp: 90 tablet, Rfl: 1   venlafaxine XR (EFFEXOR-XR) 150 MG 24 hr capsule, Take 150 mg by mouth daily with breakfast., Disp: , Rfl:    Vitamin D, Cholecalciferol, 1000 units TABS, Take 2,000 Units by mouth daily., Disp: , Rfl:   Allergies  Allergen Reactions   Jardiance [Empagliflozin] Other (See Comments)    balanitis    Objective:   BP 134/80 (BP Location: Right Arm, Patient Position: Sitting, Cuff Size: Large)   Pulse 84   Temp (!) 97.3 F (36.3 C) (Temporal)   Ht 5\' 11"  (1.803 m)   Wt 281 lb (127.5 kg)   SpO2 95%   BMI 39.19 kg/m    Physical Exam Constitutional:      General: He is not in acute distress.    Appearance: Normal appearance. He is normal weight. He is not ill-appearing, toxic-appearing or diaphoretic.  Cardiovascular:     Rate and Rhythm: Normal rate and regular rhythm.  Pulmonary:     Effort: Pulmonary effort is normal.     Breath sounds: Decreased air movement (painful expanison) present. Examination of the right-lower field reveals rhonchi. Examination of the left-lower field reveals rhonchi. Rhonchi present.  Chest:     Chest wall: Tenderness (along right rib line) present.  Musculoskeletal:        General: Normal range of motion.  Neurological:     General: No focal deficit present.     Mental Status: He is alert and oriented to person, place, and time. Mental status is at baseline.  Psychiatric:        Mood and Affect: Mood normal.        Behavior: Behavior normal.        Thought Content: Thought content normal.        Judgment: Judgment normal.     Assessment & Plan:  On potassium wasting diuretic therapy -     Basic metabolic panel  Anemia, unspecified type -     CBC  Abnormal weight gain -     TSH  Respiratory crackles at left lung base -     Amoxicillin-Pot Clavulanate; Take 1 tablet by mouth 2 (two) times daily.  Dispense: 20 tablet; Refill: 0 -     Azithromycin; Take 2 tablets on day 1, then 1 tablet daily on days 2  through 5  Dispense: 6 tablet; Refill: 0  Atelectasis of both lungs Assessment & Plan: Concern for pneumonia especially without use of IC discharge from hospital.  RX for augmentin and zpack  to start together.  Report any worsening sob or any start of chest pain and seek more urgent of care.  Monitor weight as well, report any >2 pounds gain in one day.  Continue furosemide 20 mg once daily, ordering bmp to monitor potassium. Pending results.  Close f/u in one week   Orders: -     Amoxicillin-Pot Clavulanate; Take 1 tablet by mouth 2 (two) times daily.  Dispense: 20 tablet; Refill: 0 -     Azithromycin; Take 2 tablets on day 1, then 1 tablet daily on days 2 through 5  Dispense: 6 tablet; Refill: 0  Skin tear of left upper arm without complication, initial encounter Assessment & Plan: Healed.        Follow up plan: Return in about 1 week (around 09/20/2023) for follow up rib fracture.  Mort Sawyers, FNP

## 2023-09-13 NOTE — Telephone Encounter (Signed)
Mort Sawyers, FNP  P Dugal Pool Can you call [facility] and see where the PT/OT is ? Pt states has not received a call or schedule of when to expect them to show up as they have not reached out yet. I did call in verbal orders on the 20th.   Home Health verbal orders Caller Name:[name] Agency Name: [facility]   Callback number: [phone] --------------------------------------------------------------- Spoke with [name]. States that she had called the pt today to set up PT but has not heard back from the pt. OT was just for an eval only per [name], the pt wasn't sure if he wanted to proceed with that. [name] will keep [place] updated.

## 2023-09-17 DIAGNOSIS — J9811 Atelectasis: Secondary | ICD-10-CM | POA: Insufficient documentation

## 2023-09-17 NOTE — Addendum Note (Signed)
Addended by: Mort Sawyers on: 09/17/2023 09:16 AM   Modules accepted: Orders

## 2023-09-17 NOTE — Telephone Encounter (Signed)
Patient returned call, advised of message below. Patient understood and will call with any further questions

## 2023-09-17 NOTE — Telephone Encounter (Signed)
Ok great thank you.  I will send him a mychart message no need to call they are pretty responsive on here.

## 2023-09-17 NOTE — Assessment & Plan Note (Signed)
Concern for pneumonia especially without use of IC discharge from hospital.  RX for augmentin and zpack to start together.  Report any worsening sob or any start of chest pain and seek more urgent of care.  Monitor weight as well, report any >2 pounds gain in one day.  Continue furosemide 20 mg once daily, ordering bmp to monitor potassium. Pending results.  Close f/u in one week

## 2023-09-17 NOTE — Assessment & Plan Note (Signed)
Healed

## 2023-09-17 NOTE — Telephone Encounter (Signed)
Can you please call the [facility] people, [name] listed below.  I am concerned with the behavior of the therapists and what the pt is reporting and want to submit a formal complaint and have this looked into. I will also be requesting a new referral to another company at this time.   Edward King : I don't know who is sending the therapy nurse out to see me but when Thay came out she just walked around and told she will call me next week no call till, she called Friday night around ten pm saying she will be at my apartment around 11 am. I called her back and got her voice mail and told her we did not have an appointment and i have plans for that day. now the other nurse that came out just sat in a chair for about hour them she said she will call next week; she did call me, and she said she couldn't come out because she locked her keys in her car, she didn't reschedule time to come out. so, who do I call to complain too. I don't need someone that will not be dependable! thanks Rosanne Ashing

## 2023-09-17 NOTE — Telephone Encounter (Signed)
Spoke with [name] with [facility]. States that she is not sure what is going on with the pt's home health nurse. [name] is going to try to reach out to the pt today and see what she can do pt. She also states that she is going to reach out to the pt's nurse and see what's been going on. [name] is a physical therapist and only deals with the pt's PT needs. If we continue to have issues with [facility] we will need to contact them at [phone].  Attempted to contact the pt to speak to him about this but he didn't answer so I left a message for him to return my call.

## 2023-09-18 ENCOUNTER — Encounter: Payer: Self-pay | Admitting: Family

## 2023-09-18 DIAGNOSIS — G4733 Obstructive sleep apnea (adult) (pediatric): Secondary | ICD-10-CM | POA: Diagnosis not present

## 2023-09-18 DIAGNOSIS — Z8673 Personal history of transient ischemic attack (TIA), and cerebral infarction without residual deficits: Secondary | ICD-10-CM | POA: Diagnosis not present

## 2023-09-18 DIAGNOSIS — G309 Alzheimer's disease, unspecified: Secondary | ICD-10-CM | POA: Diagnosis not present

## 2023-09-18 DIAGNOSIS — F0150 Vascular dementia without behavioral disturbance: Secondary | ICD-10-CM | POA: Diagnosis not present

## 2023-09-18 DIAGNOSIS — G479 Sleep disorder, unspecified: Secondary | ICD-10-CM | POA: Diagnosis not present

## 2023-09-18 DIAGNOSIS — F0280 Dementia in other diseases classified elsewhere without behavioral disturbance: Secondary | ICD-10-CM | POA: Diagnosis not present

## 2023-09-18 DIAGNOSIS — R2689 Other abnormalities of gait and mobility: Secondary | ICD-10-CM | POA: Diagnosis not present

## 2023-09-20 ENCOUNTER — Telehealth: Payer: Self-pay | Admitting: Family

## 2023-09-20 NOTE — Telephone Encounter (Signed)
Noted  

## 2023-09-20 NOTE — Telephone Encounter (Signed)
Mort Sawyers, FNP  P Micheline Rough I sent a mychart message but no response. Can you call to ask if the physical therapy has come and if there has been improvement in care or do we need to move forward with a new referral?  Also did incentive spirometer come in? ------------------------------------------- Spoke with pt. States that things have improved some with the home health nurse and PT. PT came out for the first time today and should be coming back on 09/25/2023. He has 1 more visit with the RN and 2-3 more visits with PT. Reports that he did get the new incentive spirometer and has been using 2-3 times a day. Pt has a pending OV with Tabitha on 09/24/2023 at 1120.

## 2023-09-24 ENCOUNTER — Ambulatory Visit: Payer: PPO | Admitting: Family

## 2023-09-26 ENCOUNTER — Encounter: Payer: Self-pay | Admitting: Family

## 2023-09-26 ENCOUNTER — Ambulatory Visit (INDEPENDENT_AMBULATORY_CARE_PROVIDER_SITE_OTHER): Payer: PPO | Admitting: Family

## 2023-09-26 VITALS — BP 128/86 | HR 78 | Ht 71.0 in | Wt 281.4 lb

## 2023-09-26 DIAGNOSIS — G4733 Obstructive sleep apnea (adult) (pediatric): Secondary | ICD-10-CM | POA: Diagnosis not present

## 2023-09-26 DIAGNOSIS — I951 Orthostatic hypotension: Secondary | ICD-10-CM

## 2023-09-26 DIAGNOSIS — E1159 Type 2 diabetes mellitus with other circulatory complications: Secondary | ICD-10-CM

## 2023-09-26 DIAGNOSIS — I10 Essential (primary) hypertension: Secondary | ICD-10-CM

## 2023-09-26 DIAGNOSIS — L231 Allergic contact dermatitis due to adhesives: Secondary | ICD-10-CM

## 2023-09-26 DIAGNOSIS — I152 Hypertension secondary to endocrine disorders: Secondary | ICD-10-CM

## 2023-09-26 MED ORDER — TRIAMCINOLONE ACETONIDE 0.1 % EX CREA
1.0000 | TOPICAL_CREAM | Freq: Two times a day (BID) | CUTANEOUS | 0 refills | Status: DC
Start: 2023-09-26 — End: 2024-01-21

## 2023-09-26 MED ORDER — VALSARTAN 40 MG PO TABS: 40.0000 mg | ORAL_TABLET | Freq: Every day | ORAL | 0 refills | Status: DC

## 2023-09-26 NOTE — Assessment & Plan Note (Signed)
Stop diovan hydrochlorothiazide  Start diovan 40 mg once daily Pt advised of the following:  Continue medication as prescribed. Monitor blood pressure periodically and/or when you feel symptomatic. Goal is <130/90 on average. Ensure that you have rested for 30 minutes prior to checking your blood pressure. Record your readings and bring them to your next visit if necessary.work on a low sodium diet. Rise slowly upon standing Continue to use cane

## 2023-09-26 NOTE — Progress Notes (Signed)
Established Patient Office Visit  Subjective:      CC:  Chief Complaint  Patient presents with   Medical Management of Chronic Issues    Follow up on rib fracture    HPI: Edward King is a 72 y.o. male presenting on 09/26/2023 for Medical Management of Chronic Issues (Follow up on rib fracture) . Here for follow up on rib fractures, pain is improving. He is able to lie down again and not sleep in a recliner, able to sleep on side as well. Is taking tylenol only when he goes to bed which is helpful. He is walking with a cane. Denies sob or chest pain.   Dementia: recently seen by Dr. Sherryll Burger, on donepezil 5 mg once daily.  Advised to try to start CPAP again now that rib fractures have improved, Dr. Sherryll Burger educated him on importance. He states has not yet started the CPAP but he plans to start this again tonight.  HTN: pt states dizzy at times, last visit with cardiology 6/24. He states if he goes from sitting to standing position he will feel dizzy and unsteady.  On furosemide 20 mg once daily, valsartan hydrochlorothiazide 80/12.5 mg that he reports he is taking 1/2 tablet of instead of one tablet    Social history:  Relevant past medical, surgical, family and social history reviewed and updated as indicated. Interim medical history since our last visit reviewed.  Allergies and medications reviewed and updated.  DATA REVIEWED: CHART IN EPIC     ROS: Negative unless specifically indicated above in HPI.    Current Outpatient Medications:    ALEVAZOL 1 % OINT, APPLY 1 APPLICATION TOPICALLY 2 (TWO) TIMES DAILY FOR 14 DAYS., Disp: 56.7 g, Rfl: 0   amoxicillin-clavulanate (AUGMENTIN) 875-125 MG tablet, Take 1 tablet by mouth 2 (two) times daily., Disp: 20 tablet, Rfl: 0   aspirin EC 81 MG tablet, Take 1 tablet (81 mg total) by mouth daily. Swallow whole., Disp: 90 tablet, Rfl: 1   Blood Glucose Monitoring Suppl (CONTOUR NEXT ONE) KIT, Use to check blood sugar daily, Disp: 1 kit,  Rfl: 0   buPROPion (WELLBUTRIN SR) 150 MG 12 hr tablet, Take 150 mg by mouth daily., Disp: , Rfl:    clonazePAM (KLONOPIN) 0.5 MG tablet, Take 0.5 mg by mouth 3 (three) times daily as needed for anxiety., Disp: , Rfl:    Coenzyme Q10 (COQ-10) 100 MG CAPS, Take 100 mg by mouth daily., Disp: , Rfl:    Cyanocobalamin (VITAMIN B-12 PO), Take 1,000 mcg by mouth daily at 6 (six) AM., Disp: , Rfl:    donepezil (ARICEPT) 5 MG tablet, Take 5 mg by mouth every morning., Disp: , Rfl:    Dulaglutide (TRULICITY) 1.5 MG/0.5ML SOPN, Inject 1.5 mg into the skin once a week., Disp: 2 mL, Rfl: 2   furosemide (LASIX) 20 MG tablet, Take 1 tablet (20 mg total) by mouth daily., Disp: 30 tablet, Rfl: 3   gabapentin (NEURONTIN) 300 MG capsule, TAKE 1 BY MOUTH EVERY DAY IN THE MORNING, 1 AT MID DAY, THEN 2 NIGHTLY (Patient taking differently: TAKE 1 BY MOUTH EVERY DAY IN THE MORNING, 2 AT MID DAY, THEN 1 NIGHTLY), Disp: 120 capsule, Rfl: 2   glucose blood (CONTOUR NEXT TEST) test strip, Use to check blood sugar daily, Disp: 100 each, Rfl: 3   mesalamine (APRISO) 0.375 g 24 hr capsule, Take 375 mg by mouth daily., Disp: , Rfl:    metFORMIN (GLUCOPHAGE-XR) 500 MG 24 hr  tablet, TAKE 2 TABLETS BY MOUTH TWICE A DAY, Disp: 360 tablet, Rfl: 4   mirtazapine (REMERON) 30 MG tablet, Take 30 mg by mouth at bedtime., Disp: , Rfl:    pravastatin (PRAVACHOL) 40 MG tablet, TAKE 1 TABLET (40 MG TOTAL) BY MOUTH DAILY WITH LUNCH., Disp: 90 tablet, Rfl: 3   tamsulosin (FLOMAX) 0.4 MG CAPS capsule, Take 0.4 mg by mouth daily., Disp: , Rfl:    triamcinolone cream (KENALOG) 0.1 %, Apply 1 Application topically 2 (two) times daily., Disp: 30 g, Rfl: 0   valsartan (DIOVAN) 40 MG tablet, Take 1 tablet (40 mg total) by mouth daily., Disp: 30 tablet, Rfl: 0   venlafaxine XR (EFFEXOR-XR) 150 MG 24 hr capsule, Take 150 mg by mouth daily with breakfast., Disp: , Rfl:    Vitamin D, Cholecalciferol, 1000 units TABS, Take 2,000 Units by mouth daily.,  Disp: , Rfl:       Objective:    BP 128/86   Pulse 78   Ht 5\' 11"  (1.803 m)   Wt 281 lb 6.4 oz (127.6 kg)   SpO2 98%   BMI 39.25 kg/m   Wt Readings from Last 3 Encounters:  09/26/23 281 lb 6.4 oz (127.6 kg)  09/13/23 281 lb (127.5 kg)  09/10/23 281 lb 9.6 oz (127.7 kg)    Physical Exam Vitals reviewed.  Constitutional:      General: He is not in acute distress.    Appearance: Normal appearance. He is normal weight. He is not ill-appearing, toxic-appearing or diaphoretic.  Cardiovascular:     Rate and Rhythm: Normal rate and regular rhythm.  Pulmonary:     Effort: Pulmonary effort is normal.     Breath sounds: Normal breath sounds.  Musculoskeletal:        General: Normal range of motion.     Right lower leg: 1+ Pitting Edema present.     Left lower leg: 1+ Pitting Edema present.  Skin:    Comments: Right upper chest with some papular lesions   Neurological:     General: No focal deficit present.     Mental Status: He is alert and oriented to person, place, and time. Mental status is at baseline.  Psychiatric:        Mood and Affect: Mood normal.        Behavior: Behavior normal.        Thought Content: Thought content normal.        Judgment: Judgment normal.            Assessment & Plan:  Hypertension associated with diabetes (HCC) -     Valsartan; Take 1 tablet (40 mg total) by mouth daily.  Dispense: 30 tablet; Refill: 0  Orthostatic hypotension -     Valsartan; Take 1 tablet (40 mg total) by mouth daily.  Dispense: 30 tablet; Refill: 0  Allergic contact dermatitis due to adhesives Assessment & Plan: Triam cream sent Low suspicion for shingles, likely contact dermatitis from EKG leads in hospital.    Orders: -     Triamcinolone Acetonide; Apply 1 Application topically 2 (two) times daily.  Dispense: 30 g; Refill: 0  Essential hypertension Assessment & Plan: Stop diovan hydrochlorothiazide  Start diovan 40 mg once daily Pt advised of the  following:  Continue medication as prescribed. Monitor blood pressure periodically and/or when you feel symptomatic. Goal is <130/90 on average. Ensure that you have rested for 30 minutes prior to checking your blood pressure. Record your readings and bring  them to your next visit if necessary.work on a low sodium diet. Rise slowly upon standing Continue to use cane      Return in about 3 weeks (around 10/17/2023) for f/u blood pressure.  Mort Sawyers, MSN, APRN, FNP-C [facility]

## 2023-09-26 NOTE — Assessment & Plan Note (Signed)
Triam cream sent Low suspicion for shingles, likely contact dermatitis from EKG leads in hospital.

## 2023-09-26 NOTE — Patient Instructions (Signed)
  Stop valsartan hydrochlorothiazide  Start valsartan only (80 mg tablet)

## 2023-09-27 ENCOUNTER — Telehealth: Payer: Self-pay | Admitting: Family

## 2023-09-27 NOTE — Telephone Encounter (Signed)
Patient called with a medication question regarding valsartan (DIOVAN) 40 MG tablet, states that his cardiologist previously had him on  Patinet wanted to know if the new medication dosage is what he's supposed to be taking? Since his previous dose was lower at 12.5 mg he wants to know if it is okay for him to take the 40?

## 2023-09-27 NOTE — Telephone Encounter (Signed)
Can you please call pt and ask for clarification?   What I need him to do is this.  Do not take combo pill anymore (diovan HCT) I want him taking diovan (valsartan) only at 40 mg once daily.

## 2023-09-27 NOTE — Telephone Encounter (Addendum)
Spoke with pt. Pt was confused on what medication Tabitha wanted him to be on. This has been explained to him again per Tabitha's OV note. Advised him to monitor his BP and let us know if his BP runs too low or too high. Pt verbalized understanding. Nothing further was needed.

## 2023-09-28 ENCOUNTER — Telehealth: Payer: Self-pay | Admitting: Family

## 2023-09-28 NOTE — Telephone Encounter (Signed)
[name] from [facility] called in and wanted to let Tabitha know that the PT visit was missed this week. She stated that the patient wasn't home for the visit at the day and time the  had requested. She stated that he is due to be discharged next week and she is going to go ahead and discharge him. She stated that if he still needs services that an outpatient PT will be better for him. Thank you!

## 2023-09-30 NOTE — Progress Notes (Signed)
This patient is appearing on a report for being at risk of failing the adherence measure for cholesterol (statin) medications this calendar year.   Medication: pravastatin 40 mg PO daily Last fill date: 09/21/23 for 90 day supply  Insurance report was not up to date. No action needed at this time.   Nils Pyle, PharmD PGY1 Pharmacy Resident

## 2023-10-01 NOTE — Telephone Encounter (Signed)
Spoke with pt. He states that his pain is improving and has been doing the exercises on his own and does not feel like he needs to continue with PT. Pt has an appointment with Tabitha at the end of the month and states that he will talk with her then about if PT needs to be ordered again.

## 2023-10-01 NOTE — Telephone Encounter (Signed)
Ok noted.  Let's call pt and see if he would like to continue with physical therapy or if he feels this is no longer needed since pain is improving.

## 2023-10-09 ENCOUNTER — Encounter: Payer: Self-pay | Admitting: Family

## 2023-10-09 NOTE — Telephone Encounter (Signed)
Noted  

## 2023-10-09 NOTE — Telephone Encounter (Signed)
[name] from [facility] called stating that she has been trying to reach patient to reschedule last weeks visit,however they are not  answering or returning her call. She just wanted to call and have that reported.

## 2023-10-11 NOTE — Telephone Encounter (Signed)
error 

## 2023-10-17 ENCOUNTER — Encounter: Payer: Self-pay | Admitting: Family

## 2023-10-17 ENCOUNTER — Telehealth: Payer: PPO | Admitting: Family

## 2023-10-17 VITALS — BP 138/86 | HR 74 | Temp 97.6°F | Ht 71.0 in | Wt 287.2 lb

## 2023-10-17 DIAGNOSIS — I639 Cerebral infarction, unspecified: Secondary | ICD-10-CM

## 2023-10-17 DIAGNOSIS — I10 Essential (primary) hypertension: Secondary | ICD-10-CM

## 2023-10-17 DIAGNOSIS — R600 Localized edema: Secondary | ICD-10-CM

## 2023-10-17 DIAGNOSIS — R635 Abnormal weight gain: Secondary | ICD-10-CM | POA: Diagnosis not present

## 2023-10-17 DIAGNOSIS — E119 Type 2 diabetes mellitus without complications: Secondary | ICD-10-CM | POA: Diagnosis not present

## 2023-10-17 DIAGNOSIS — J90 Pleural effusion, not elsewhere classified: Secondary | ICD-10-CM

## 2023-10-17 LAB — POCT GLYCOSYLATED HEMOGLOBIN (HGB A1C): Hemoglobin A1C: 6.7 % — AB (ref 4.0–5.6)

## 2023-10-17 MED ORDER — FUROSEMIDE 20 MG PO TABS
20.0000 mg | ORAL_TABLET | Freq: Every day | ORAL | 3 refills | Status: DC
Start: 2023-10-17 — End: 2023-11-28

## 2023-10-17 NOTE — Assessment & Plan Note (Signed)
Dizziness has resolved, blood pressure in goal range. Continue diovan 40 mg once daily.

## 2023-10-17 NOTE — Patient Instructions (Addendum)
  Continue diovan 40 mg once daily.  Start furosemide 20 mg once daily, monitor your weight daily. Let me know if you have any weight gain > 2 pounds in one day.   When you follow up we will recheck your potassium since furosemide can decrease your potassium.  Make sure you are drinking a goal of 70-90 ounces of water daily to stay hydrated.   Wear compression stockings on your lower legs and elevate them when sitting down. Watch the salt in your diet as well.   I have order an ultrasound of your heart as well. You will be called to schedule in the next 1-2 if you are not let me know.

## 2023-10-17 NOTE — Assessment & Plan Note (Signed)
Start furosemide 20 mg once daily  Wear compression stockings and elevate Work on low sodium diet.  Monitor weight daily, report any abn weight gain of >2 pounds in one day  Follow up with cardiologist as scheduled.  Will evaluate for CHF due to h/o pleural effusion with good response to furosemide, ordering echocardiogram.

## 2023-10-17 NOTE — Assessment & Plan Note (Signed)
Poc A1c today in office  Continue trulicity 1.5 mg weekly, and metformin XR 500 two tablets twice daily

## 2023-10-17 NOTE — Progress Notes (Deleted)
Established Patient Office Visit  Subjective:      CC:  Chief Complaint  Patient presents with   Hypertension    States BP has been running normal at home    HPI: Edward King is a 72 y.o. male presenting on 10/17/2023 for Hypertension (States BP has been running normal at home) . HTN: last visit stopped diovan hydrochlorothiazide and has since only been taking diovan 40 mg once daily. Average at home is around 130/70 or so. Denies cp palp and or sob. He does state he has gained some weight, 6 pounds in the last few weeks since stopping the HCT. He has not been wearing his compression stockings, and he is noticing that his lower legs are holding a lot of fluid. Denies DOE and or sob which is good.   Wt Readings from Last 3 Encounters:  10/17/23 287 lb 3.2 oz (130.3 kg)  09/26/23 281 lb 6.4 oz (127.6 kg)  09/13/23 281 lb (127.5 kg)         Social history:  Relevant past medical, surgical, family and social history reviewed and updated as indicated. Interim medical history since our last visit reviewed.  Allergies and medications reviewed and updated.  DATA REVIEWED: CHART IN EPIC     ROS: Negative unless specifically indicated above in HPI.    Current Outpatient Medications:    ALEVAZOL 1 % OINT, APPLY 1 APPLICATION TOPICALLY 2 (TWO) TIMES DAILY FOR 14 DAYS., Disp: 56.7 g, Rfl: 0   aspirin EC 81 MG tablet, Take 1 tablet (81 mg total) by mouth daily. Swallow whole., Disp: 90 tablet, Rfl: 1   Blood Glucose Monitoring Suppl (CONTOUR NEXT ONE) KIT, Use to check blood sugar daily, Disp: 1 kit, Rfl: 0   buPROPion (WELLBUTRIN SR) 150 MG 12 hr tablet, Take 150 mg by mouth daily., Disp: , Rfl:    clonazePAM (KLONOPIN) 0.5 MG tablet, Take 0.5 mg by mouth 3 (three) times daily as needed for anxiety., Disp: , Rfl:    Coenzyme Q10 (COQ-10) 100 MG CAPS, Take 100 mg by mouth daily., Disp: , Rfl:    Cyanocobalamin (VITAMIN B-12 PO), Take 1,000 mcg by mouth daily at 6 (six) AM.,  Disp: , Rfl:    donepezil (ARICEPT) 5 MG tablet, Take 5 mg by mouth every morning., Disp: , Rfl:    Dulaglutide (TRULICITY) 1.5 MG/0.5ML SOPN, Inject 1.5 mg into the skin once a week., Disp: 2 mL, Rfl: 2   gabapentin (NEURONTIN) 300 MG capsule, TAKE 1 BY MOUTH EVERY DAY IN THE MORNING, 1 AT MID DAY, THEN 2 NIGHTLY (Patient taking differently: TAKE 1 BY MOUTH EVERY DAY IN THE MORNING, 2 AT MID DAY, THEN 1 NIGHTLY), Disp: 120 capsule, Rfl: 2   glucose blood (CONTOUR NEXT TEST) test strip, Use to check blood sugar daily, Disp: 100 each, Rfl: 3   mesalamine (APRISO) 0.375 g 24 hr capsule, Take 375 mg by mouth daily., Disp: , Rfl:    metFORMIN (GLUCOPHAGE-XR) 500 MG 24 hr tablet, TAKE 2 TABLETS BY MOUTH TWICE A DAY, Disp: 360 tablet, Rfl: 4   mirtazapine (REMERON) 30 MG tablet, Take 30 mg by mouth at bedtime., Disp: , Rfl:    pravastatin (PRAVACHOL) 40 MG tablet, TAKE 1 TABLET (40 MG TOTAL) BY MOUTH DAILY WITH LUNCH., Disp: 90 tablet, Rfl: 3   tamsulosin (FLOMAX) 0.4 MG CAPS capsule, Take 0.4 mg by mouth daily., Disp: , Rfl:    triamcinolone cream (KENALOG) 0.1 %, Apply 1 Application topically  2 (two) times daily., Disp: 30 g, Rfl: 0   valsartan (DIOVAN) 40 MG tablet, Take 1 tablet (40 mg total) by mouth daily., Disp: 30 tablet, Rfl: 0   venlafaxine XR (EFFEXOR-XR) 150 MG 24 hr capsule, Take 150 mg by mouth daily with breakfast., Disp: , Rfl:    Vitamin D, Cholecalciferol, 1000 units TABS, Take 2,000 Units by mouth daily., Disp: , Rfl:    furosemide (LASIX) 20 MG tablet, Take 1 tablet (20 mg total) by mouth daily., Disp: 30 tablet, Rfl: 3      Objective:    BP 138/86 (BP Location: Left Arm, Patient Position: Sitting, Cuff Size: Normal)   Pulse 74   Temp 97.6 F (36.4 C) (Temporal)   Ht 5\' 11"  (1.803 m)   Wt 287 lb 3.2 oz (130.3 kg)   SpO2 95%   BMI 40.06 kg/m   Wt Readings from Last 3 Encounters:  10/17/23 287 lb 3.2 oz (130.3 kg)  09/26/23 281 lb 6.4 oz (127.6 kg)  09/13/23 281 lb (127.5  kg)    Physical Exam Constitutional:      General: He is not in acute distress.    Appearance: He is not ill-appearing, toxic-appearing or diaphoretic.  Cardiovascular:     Rate and Rhythm: Normal rate.  Musculoskeletal:        General: Normal range of motion.  Neurological:     General: No focal deficit present.     Mental Status: He is alert and oriented to person, place, and time. Mental status is at baseline.  Psychiatric:        Mood and Affect: Mood normal.        Behavior: Behavior normal.        Thought Content: Thought content normal.        Judgment: Judgment normal.           Assessment & Plan:  Pedal edema Assessment & Plan: Start furosemide 20 mg once daily  Wear compression stockings and elevate Work on low sodium diet.  Monitor weight daily, report any abn weight gain of >2 pounds in one day  Follow up with cardiologist as scheduled.  Will evaluate for CHF due to h/o pleural effusion with good response to furosemide, ordering echocardiogram.   Orders: -     ECHOCARDIOGRAM COMPLETE; Future  Pleural effusion -     ECHOCARDIOGRAM COMPLETE; Future -     Furosemide; Take 1 tablet (20 mg total) by mouth daily.  Dispense: 30 tablet; Refill: 3  Abnormal weight gain -     ECHOCARDIOGRAM COMPLETE; Future  Cerebrovascular accident (CVA), unspecified mechanism (HCC) -     ECHOCARDIOGRAM COMPLETE; Future  Type 2 diabetes mellitus without complication, unspecified whether long term insulin use (HCC) Assessment & Plan: Poc A1c today in office  Continue trulicity 1.5 mg weekly, and metformin XR 500 two tablets twice daily  Orders: -     POCT glycosylated hemoglobin (Hb A1C)  Essential hypertension Assessment & Plan: Dizziness has resolved, blood pressure in goal range. Continue diovan 40 mg once daily.      Return in about 6 weeks (around 11/28/2023) for f/u diabetes.  Mort Sawyers, MSN, APRN, FNP-C [facility]

## 2023-10-18 ENCOUNTER — Other Ambulatory Visit: Payer: Self-pay | Admitting: Family

## 2023-10-18 DIAGNOSIS — I951 Orthostatic hypotension: Secondary | ICD-10-CM

## 2023-10-18 DIAGNOSIS — E1159 Type 2 diabetes mellitus with other circulatory complications: Secondary | ICD-10-CM

## 2023-10-26 DIAGNOSIS — E119 Type 2 diabetes mellitus without complications: Secondary | ICD-10-CM | POA: Diagnosis not present

## 2023-10-26 DIAGNOSIS — H353132 Nonexudative age-related macular degeneration, bilateral, intermediate dry stage: Secondary | ICD-10-CM | POA: Diagnosis not present

## 2023-10-26 DIAGNOSIS — Z961 Presence of intraocular lens: Secondary | ICD-10-CM | POA: Diagnosis not present

## 2023-10-26 DIAGNOSIS — H47393 Other disorders of optic disc, bilateral: Secondary | ICD-10-CM | POA: Diagnosis not present

## 2023-10-26 LAB — HM DIABETES EYE EXAM

## 2023-10-27 DIAGNOSIS — G4733 Obstructive sleep apnea (adult) (pediatric): Secondary | ICD-10-CM | POA: Diagnosis not present

## 2023-10-29 ENCOUNTER — Telehealth: Payer: Self-pay | Admitting: Family

## 2023-10-29 NOTE — Telephone Encounter (Signed)
Prescription Request  10/29/2023  LOV: 09/26/2023  What is the name of the medication or equipment? pravastatin (PRAVACHOL) 40 MG tablet   Have you contacted your pharmacy to request a refill? No   Which pharmacy would you like this sent to?  [id number] [address], [place] - [address] [place] [address] Phone: [phone] Fax: [phone]    Patient notified that their request is being sent to the clinical staff for review and that they should receive a response within 2 business days.   Please advise at Mobile [phone] (mobile)   Pt states he's been requesting refills from pharmacy prior to calling us & also has tried requesting refills via mychart as well. Pt mentioned is now out of meds.

## 2023-10-30 ENCOUNTER — Other Ambulatory Visit: Payer: Self-pay | Admitting: Family

## 2023-10-30 DIAGNOSIS — E119 Type 2 diabetes mellitus without complications: Secondary | ICD-10-CM

## 2023-10-30 DIAGNOSIS — R635 Abnormal weight gain: Secondary | ICD-10-CM

## 2023-10-30 DIAGNOSIS — R600 Localized edema: Secondary | ICD-10-CM

## 2023-10-30 DIAGNOSIS — I639 Cerebral infarction, unspecified: Secondary | ICD-10-CM

## 2023-10-30 DIAGNOSIS — J90 Pleural effusion, not elsewhere classified: Secondary | ICD-10-CM

## 2023-10-30 DIAGNOSIS — I10 Essential (primary) hypertension: Secondary | ICD-10-CM

## 2023-10-30 MED ORDER — PRAVASTATIN SODIUM 40 MG PO TABS
40.0000 mg | ORAL_TABLET | Freq: Every day | ORAL | 3 refills | Status: AC
Start: 2023-10-30 — End: ?

## 2023-10-30 NOTE — Telephone Encounter (Signed)
This medication was refilled on 09/11/2023 for #90 with 3 refills. Pt needs to contact his pharmacy for this refill. Pt is aware of this information but states that [facility] telling [facility]. Rx has been resent to pt's preferred pharmacy. Nothing further was needed.

## 2023-10-31 ENCOUNTER — Ambulatory Visit: Payer: PPO | Attending: Family

## 2023-10-31 DIAGNOSIS — I639 Cerebral infarction, unspecified: Secondary | ICD-10-CM | POA: Diagnosis not present

## 2023-10-31 DIAGNOSIS — R600 Localized edema: Secondary | ICD-10-CM

## 2023-10-31 DIAGNOSIS — J90 Pleural effusion, not elsewhere classified: Secondary | ICD-10-CM

## 2023-10-31 LAB — ECHOCARDIOGRAM COMPLETE
AR max vel: 2.54 cm2
AV Area VTI: 2.59 cm2
AV Area mean vel: 2.59 cm2
AV Mean grad: 4 mm[Hg]
AV Peak grad: 7.8 mm[Hg]
Ao pk vel: 1.4 m/s
Area-P 1/2: 3.72 cm2
Calc EF: 57 %
S' Lateral: 3.3 cm
Single Plane A2C EF: 52.1 %
Single Plane A4C EF: 61.3 %

## 2023-11-05 ENCOUNTER — Telehealth: Payer: Self-pay | Admitting: Family

## 2023-11-05 ENCOUNTER — Other Ambulatory Visit: Payer: Self-pay | Admitting: Family

## 2023-11-05 DIAGNOSIS — R600 Localized edema: Secondary | ICD-10-CM

## 2023-11-05 DIAGNOSIS — I270 Primary pulmonary hypertension: Secondary | ICD-10-CM

## 2023-11-05 NOTE — Telephone Encounter (Signed)
Patient called the office and stated he was returning Lindsey's call, asked for a cal back when able. Please advise

## 2023-11-05 NOTE — Telephone Encounter (Signed)
See echocardiogram result note.

## 2023-11-05 NOTE — Progress Notes (Signed)
Please call pt he tends not to look at his mychart.   Output of the heart on the echocardiogram consistent with grade 1 diastolic dysfunction which is normal for age.   Some pulmonary hypertension seen on the echocardiogram, this needs to be investigated a bit further. I am going to refer you to pulmonary (lung doctor). They should call in 1-2 weeks to schedule an appt let me know if you do not hear from them. This could possibly be the reason for your lower leg swelling.  Otherwise echo looks good.

## 2023-11-05 NOTE — Progress Notes (Signed)
Virtual Visit via Video note  I connected with Edward King on 11/05/23 at office by video and verified that I am speaking with the correct person using two identifiers.The provider, Mort Sawyers, FNP is located in their home at time of visit.  I discussed the limitations, risks, security and privacy concerns of performing an evaluation and management service by video and the availability of in person appointments. I also discussed with the patient that there may be a patient responsible charge related to this service. The patient expressed understanding and agreed to proceed.  Subjective: PCP: Mort Sawyers, FNP  Chief Complaint  Patient presents with   Hypertension    States BP has been running normal at home    Hypertension  HTN: last visit stopped diovan hydrochlorothiazide and has since only been taking diovan 40 mg once daily. Average at home is around 130/70 or so. Denies cp palp and or sob. He does state he has gained some weight, 6 pounds in the last few weeks since stopping the HCT. He has not been wearing his compression stockings, and he is noticing that his lower legs are holding a lot of fluid. Denies DOE and or sob which is good.       Wt Readings from Last 3 Encounters:  10/17/23 287 lb 3.2 oz (130.3 kg)  09/26/23 281 lb 6.4 oz (127.6 kg)  09/13/23 281 lb (127.5 kg)       ROS: Per HPI  Current Outpatient Medications:    ALEVAZOL 1 % OINT, APPLY 1 APPLICATION TOPICALLY 2 (TWO) TIMES DAILY FOR 14 DAYS., Disp: 56.7 g, Rfl: 0   aspirin EC 81 MG tablet, Take 1 tablet (81 mg total) by mouth daily. Swallow whole., Disp: 90 tablet, Rfl: 1   Blood Glucose Monitoring Suppl (CONTOUR NEXT ONE) KIT, Use to check blood sugar daily, Disp: 1 kit, Rfl: 0   buPROPion (WELLBUTRIN SR) 150 MG 12 hr tablet, Take 150 mg by mouth daily., Disp: , Rfl:    clonazePAM (KLONOPIN) 0.5 MG tablet, Take 0.5 mg by mouth 3 (three) times daily as needed for anxiety., Disp: , Rfl:    Coenzyme  Q10 (COQ-10) 100 MG CAPS, Take 100 mg by mouth daily., Disp: , Rfl:    Cyanocobalamin (VITAMIN B-12 PO), Take 1,000 mcg by mouth daily at 6 (six) AM., Disp: , Rfl:    donepezil (ARICEPT) 5 MG tablet, Take 5 mg by mouth every morning., Disp: , Rfl:    Dulaglutide (TRULICITY) 1.5 MG/0.5ML SOPN, Inject 1.5 mg into the skin once a week., Disp: 2 mL, Rfl: 2   gabapentin (NEURONTIN) 300 MG capsule, TAKE 1 BY MOUTH EVERY DAY IN THE MORNING, 1 AT MID DAY, THEN 2 NIGHTLY (Patient taking differently: TAKE 1 BY MOUTH EVERY DAY IN THE MORNING, 2 AT MID DAY, THEN 1 NIGHTLY), Disp: 120 capsule, Rfl: 2   glucose blood (CONTOUR NEXT TEST) test strip, Use to check blood sugar daily, Disp: 100 each, Rfl: 3   mesalamine (APRISO) 0.375 g 24 hr capsule, Take 375 mg by mouth daily., Disp: , Rfl:    metFORMIN (GLUCOPHAGE-XR) 500 MG 24 hr tablet, TAKE 2 TABLETS BY MOUTH TWICE A DAY, Disp: 360 tablet, Rfl: 4   mirtazapine (REMERON) 30 MG tablet, Take 30 mg by mouth at bedtime., Disp: , Rfl:    tamsulosin (FLOMAX) 0.4 MG CAPS capsule, Take 0.4 mg by mouth daily., Disp: , Rfl:    triamcinolone cream (KENALOG) 0.1 %, Apply 1 Application topically  2 (two) times daily., Disp: 30 g, Rfl: 0   venlafaxine XR (EFFEXOR-XR) 150 MG 24 hr capsule, Take 150 mg by mouth daily with breakfast., Disp: , Rfl:    Vitamin D, Cholecalciferol, 1000 units TABS, Take 2,000 Units by mouth daily., Disp: , Rfl:    furosemide (LASIX) 20 MG tablet, Take 1 tablet (20 mg total) by mouth daily., Disp: 30 tablet, Rfl: 3   pravastatin (PRAVACHOL) 40 MG tablet, Take 1 tablet (40 mg total) by mouth daily with lunch., Disp: 90 tablet, Rfl: 3   valsartan (DIOVAN) 40 MG tablet, TAKE 1 TABLET BY MOUTH EVERY DAY, Disp: 90 tablet, Rfl: 1  Observations/Objective: Physical Exam Constitutional:      General: He is not in acute distress.    Appearance: Normal appearance. He is normal weight. He is not ill-appearing, toxic-appearing or diaphoretic.  Cardiovascular:      Rate and Rhythm: Normal rate.  Pulmonary:     Effort: Pulmonary effort is normal.  Musculoskeletal:        General: Normal range of motion.  Neurological:     General: No focal deficit present.     Mental Status: He is alert and oriented to person, place, and time. Mental status is at baseline.  Psychiatric:        Mood and Affect: Mood normal.        Behavior: Behavior normal.        Thought Content: Thought content normal.        Judgment: Judgment normal.     Assessment and Plan: Pedal edema Assessment & Plan: Start furosemide 20 mg once daily  Wear compression stockings and elevate Work on low sodium diet.  Monitor weight daily, report any abn weight gain of >2 pounds in one day  Follow up with cardiologist as scheduled.  Will evaluate for CHF due to h/o pleural effusion with good response to furosemide, ordering echocardiogram.    Pleural effusion -     Furosemide; Take 1 tablet (20 mg total) by mouth daily.  Dispense: 30 tablet; Refill: 3  Abnormal weight gain  Cerebrovascular accident (CVA), unspecified mechanism (HCC)  Type 2 diabetes mellitus without complication, unspecified whether long term insulin use (HCC) Assessment & Plan: Poc A1c today in office  Continue trulicity 1.5 mg weekly, and metformin XR 500 two tablets twice daily  Orders: -     POCT glycosylated hemoglobin (Hb A1C)  Essential hypertension Assessment & Plan: Dizziness has resolved, blood pressure in goal range. Continue diovan 40 mg once daily.     Follow Up Instructions: Return in about 6 weeks (around 11/28/2023) for f/u diabetes.   I discussed the assessment and treatment plan with the patient. The patient was provided an opportunity to ask questions and all were answered. The patient agreed with the plan and demonstrated an understanding of the instructions.   The patient was advised to call back or seek an in-person evaluation if the symptoms worsen or if the condition fails  to improve as anticipated.  The above assessment and management plan was discussed with the patient. The patient verbalized understanding of and has agreed to the management plan. Patient is aware to call the clinic if symptoms persist or worsen. Patient is aware when to return to the clinic for a follow-up visit. Patient educated on when it is appropriate to go to the emergency department.     Mort Sawyers, MSN, APRN, FNP-C [facility]

## 2023-11-05 NOTE — Addendum Note (Signed)
Addended by: Mort Sawyers on: 11/05/2023 08:53 AM   Modules accepted: Level of Service

## 2023-11-13 ENCOUNTER — Encounter: Payer: Self-pay | Admitting: Family

## 2023-11-19 DIAGNOSIS — F418 Other specified anxiety disorders: Secondary | ICD-10-CM | POA: Diagnosis not present

## 2023-11-19 DIAGNOSIS — F331 Major depressive disorder, recurrent, moderate: Secondary | ICD-10-CM | POA: Diagnosis not present

## 2023-11-26 DIAGNOSIS — M170 Bilateral primary osteoarthritis of knee: Secondary | ICD-10-CM | POA: Diagnosis not present

## 2023-11-27 DIAGNOSIS — G4733 Obstructive sleep apnea (adult) (pediatric): Secondary | ICD-10-CM | POA: Diagnosis not present

## 2023-11-28 ENCOUNTER — Encounter: Payer: Self-pay | Admitting: Family

## 2023-11-28 ENCOUNTER — Ambulatory Visit
Admission: RE | Admit: 2023-11-28 | Discharge: 2023-11-28 | Disposition: A | Discharge status: Home / Self Care | Payer: PPO | Source: Ambulatory Visit | Attending: Family | Admitting: Family

## 2023-11-28 ENCOUNTER — Ambulatory Visit: Payer: PPO | Admitting: Family

## 2023-11-28 VITALS — BP 124/80 | HR 69 | Temp 98.2°F | Ht 71.0 in | Wt 295.6 lb

## 2023-11-28 DIAGNOSIS — Z8709 Personal history of other diseases of the respiratory system: Secondary | ICD-10-CM

## 2023-11-28 DIAGNOSIS — Z7984 Long term (current) use of oral hypoglycemic drugs: Secondary | ICD-10-CM | POA: Diagnosis not present

## 2023-11-28 DIAGNOSIS — R600 Localized edema: Secondary | ICD-10-CM | POA: Diagnosis not present

## 2023-11-28 DIAGNOSIS — F418 Other specified anxiety disorders: Secondary | ICD-10-CM | POA: Diagnosis not present

## 2023-11-28 DIAGNOSIS — G4719 Other hypersomnia: Secondary | ICD-10-CM | POA: Diagnosis not present

## 2023-11-28 DIAGNOSIS — G3184 Mild cognitive impairment, so stated: Secondary | ICD-10-CM | POA: Diagnosis not present

## 2023-11-28 DIAGNOSIS — E1149 Type 2 diabetes mellitus with other diabetic neurological complication: Secondary | ICD-10-CM | POA: Diagnosis not present

## 2023-11-28 DIAGNOSIS — I152 Hypertension secondary to endocrine disorders: Secondary | ICD-10-CM | POA: Diagnosis not present

## 2023-11-28 DIAGNOSIS — J90 Pleural effusion, not elsewhere classified: Secondary | ICD-10-CM

## 2023-11-28 DIAGNOSIS — E119 Type 2 diabetes mellitus without complications: Secondary | ICD-10-CM

## 2023-11-28 DIAGNOSIS — E1159 Type 2 diabetes mellitus with other circulatory complications: Secondary | ICD-10-CM

## 2023-11-28 DIAGNOSIS — F331 Major depressive disorder, recurrent, moderate: Secondary | ICD-10-CM | POA: Diagnosis not present

## 2023-11-28 DIAGNOSIS — R0989 Other specified symptoms and signs involving the circulatory and respiratory systems: Secondary | ICD-10-CM | POA: Diagnosis not present

## 2023-11-28 LAB — BASIC METABOLIC PANEL
BUN: 22 mg/dL (ref 6–23)
CO2: 31 meq/L (ref 19–32)
Calcium: 9 mg/dL (ref 8.4–10.5)
Chloride: 104 meq/L (ref 96–112)
Creatinine, Ser: 1.05 mg/dL (ref 0.40–1.50)
GFR: 70.57 mL/min (ref >60.00)
Glucose, Bld: 149 mg/dL — ABNORMAL HIGH (ref 70–99)
Potassium: 4.6 meq/L (ref 3.5–5.1)
Sodium: 142 meq/L (ref 135–145)

## 2023-11-28 LAB — CBC
HCT: 42 % (ref 39.0–52.0)
Hemoglobin: 13.5 g/dL (ref 13.0–17.0)
MCHC: 32.1 g/dL (ref 30.0–36.0)
MCV: 86.5 fL (ref 78.0–100.0)
Platelets: 252 10*3/uL (ref 150.0–400.0)
RBC: 4.86 Mil/uL (ref 4.22–5.81)
RDW: 15 % (ref 11.5–15.5)
WBC: 9.2 10*3/uL (ref 4.0–10.5)

## 2023-11-28 LAB — VITAMIN B12: Vitamin B-12: 668 pg/mL (ref 211–911)

## 2023-11-28 MED ORDER — FUROSEMIDE 20 MG PO TABS: 20.0000 mg | ORAL_TABLET | Freq: Every day | ORAL | 0 refills | Status: DC

## 2023-11-28 MED ORDER — GABAPENTIN 300 MG PO CAPS: ORAL_CAPSULE | ORAL | Status: DC

## 2023-11-28 MED ORDER — TRULICITY 0.75 MG/0.5ML ~~LOC~~ SOAJ: 0.7500 mg | SUBCUTANEOUS | 1 refills | Status: DC

## 2023-11-28 NOTE — Progress Notes (Signed)
Established Patient Office Visit  Subjective:   Patient ID: Edward King, male    DOB: [birth year]  Age: 72 y.o. MRN: [id number]  CC:  Chief Complaint  Patient presents with   Medical Management of Chronic Issues    HPI: Edward King is a 72 y.o. male presenting on 11/28/2023 for Medical Management of Chronic Issues  Here for f/u   Acute concerns: has trouble sleeping at night time, takes melatonin which will help him to dose off but around 1 am wakes up and can not seem to go to sleep. Up for most of the night. Excessive daytime sleepiness, hard for him to turn his brain off. Does snore at night time, he is on his CPAP. Feels groggy during the day time as well. Does see a psychiatrist, on klonipin, remeron and wellbutrin. He states has tried some sleeping medications but they didn't seem to help. B12 last checked 1 year ago at 79. Does take gabapentin 1 in am two mid day and then two at night time.   DM2: has not taken trulicity in a while because never hears anything from pharmacy about a refill, back in July was increased to 1.Marland Kitchen He is taking metformin XR 500 mg two tablets twice daily. Per note 7/24 with me this was also an issue back then and pt still with same issue of compliance, possibly due to forgetfullness (on aricept for dementia, seeing neurology)  Recently with worsening bil LE edema.  Not taking furosemide 20 mg once daily as instructed. Wears compression stockings daily, does not elevate his legs nightly states aggravating to keep them lifted up.   Pulmonary htn seen on echocardiogram has consult with pulmonary    ROS: Negative unless specifically indicated above in HPI.   Relevant past medical history reviewed and updated as indicated.   Allergies and medications reviewed and updated.   Current Outpatient Medications:    ALEVAZOL 1 % OINT, APPLY 1 APPLICATION TOPICALLY 2 (TWO) TIMES DAILY FOR 14 DAYS., Disp: 56.7 g, Rfl: 0   aspirin EC 81 MG tablet, Take 1  tablet (81 mg total) by mouth daily. Swallow whole., Disp: 90 tablet, Rfl: 1   Blood Glucose Monitoring Suppl (CONTOUR NEXT ONE) KIT, Use to check blood sugar daily, Disp: 1 kit, Rfl: 0   buPROPion (WELLBUTRIN SR) 150 MG 12 hr tablet, Take 150 mg by mouth daily., Disp: , Rfl:    clonazePAM (KLONOPIN) 0.5 MG tablet, Take 0.5 mg by mouth 3 (three) times daily as needed for anxiety., Disp: , Rfl:    Coenzyme Q10 (COQ-10) 100 MG CAPS, Take 100 mg by mouth daily., Disp: , Rfl:    cyanocobalamin (VITAMIN B12) 1000 MCG tablet, Take 1,000 mcg by mouth daily., Disp: , Rfl:    donepezil (ARICEPT) 5 MG tablet, Take 5 mg by mouth every morning., Disp: , Rfl:    Dulaglutide (TRULICITY) 0.75 MG/0.5ML SOAJ, Inject 0.75 mg into the skin once a week., Disp: 2 mL, Rfl: 1   glucose blood (CONTOUR NEXT TEST) test strip, Use to check blood sugar daily, Disp: 100 each, Rfl: 3   mesalamine (APRISO) 0.375 g 24 hr capsule, Take 375 mg by mouth daily., Disp: , Rfl:    metFORMIN (GLUCOPHAGE-XR) 500 MG 24 hr tablet, TAKE 2 TABLETS BY MOUTH TWICE A DAY, Disp: 360 tablet, Rfl: 4   mirtazapine (REMERON) 30 MG tablet, Take 30 mg by mouth at bedtime., Disp: , Rfl:    pravastatin (PRAVACHOL) 40 MG tablet,  Take 1 tablet (40 mg total) by mouth daily with lunch., Disp: 90 tablet, Rfl: 3   tamsulosin (FLOMAX) 0.4 MG CAPS capsule, Take 0.4 mg by mouth daily., Disp: , Rfl:    triamcinolone cream (KENALOG) 0.1 %, Apply 1 Application topically 2 (two) times daily., Disp: 30 g, Rfl: 0   valsartan (DIOVAN) 40 MG tablet, TAKE 1 TABLET BY MOUTH EVERY DAY, Disp: 90 tablet, Rfl: 1   venlafaxine XR (EFFEXOR-XR) 150 MG 24 hr capsule, Take 150 mg by mouth daily with breakfast., Disp: , Rfl:    Vitamin D, Cholecalciferol, 1000 units TABS, Take 2,000 Units by mouth daily., Disp: , Rfl:    furosemide (LASIX) 20 MG tablet, Take 1 tablet (20 mg total) by mouth daily., Disp: 90 tablet, Rfl: 0   gabapentin (NEURONTIN) 300 MG capsule, Take one in am, one  mid day and two tablets qhs, Disp: , Rfl:   Allergies  Allergen Reactions   Jardiance [Empagliflozin] Other (See Comments)    balanitis    Objective:   BP 124/80 (BP Location: Left Arm, Patient Position: Sitting, Cuff Size: Large)   Pulse 69   Temp 98.2 F (36.8 C) (Temporal)   Ht 5\' 11"  (1.803 m)   Wt 295 lb 9.6 oz (134.1 kg)   SpO2 97%   BMI 41.23 kg/m    Physical Exam Vitals reviewed.  Constitutional:      General: He is not in acute distress.    Appearance: Normal appearance. He is normal weight. He is not ill-appearing, toxic-appearing or diaphoretic.  Cardiovascular:     Rate and Rhythm: Normal rate and regular rhythm. No extrasystoles are present.    Heart sounds: Normal heart sounds.     Comments: Wearing bil compression stockings Pulmonary:     Effort: Pulmonary effort is normal.  Musculoskeletal:        General: Normal range of motion.     Right lower leg: 3+ Edema present.     Left lower leg: 3+ Edema present.  Neurological:     General: No focal deficit present.     Mental Status: He is alert and oriented to person, place, and time. Mental status is at baseline.  Psychiatric:        Mood and Affect: Mood normal.        Behavior: Behavior normal.        Thought Content: Thought content normal.        Judgment: Judgment normal.     Assessment & Plan:  Excessive daytime sleepiness -     Vitamin B12 -     CBC  Hypertension associated with diabetes (HCC) Assessment & Plan:  Stable.  Continue medications as prescribed.   Type 2 diabetes mellitus without complication, unspecified whether long term insulin use (HCC) Assessment & Plan: Not at goal < 7  Issues with Trulicity  Discussed with pt, will fill again pt to keep me in loop and communicate if unable to get filled.  Discussed compliance.  Cont metformin xr 500 mg two tablets twice daily    History of pleural effusion -     DG Chest 2 View; Future -     Furosemide; Take 1 tablet (20 mg  total) by mouth daily.  Dispense: 90 tablet; Refill: 0  Type 2 diabetes mellitus with other neurologic complication, without long-term current use of insulin (HCC) -     Trulicity; Inject 0.75 mg into the skin once a week.  Dispense: 2 mL; Refill: 1 -  Gabapentin; Take one in am, one mid day and two tablets qhs  Pleural effusion  Pedal edema Assessment & Plan: Cont furosemide, pt has not been taking and edema worsening Compression stockings daily with elevation of legs  Maintain low sodium diet.  Pleural effusion on cxr in past, repeat cxr pending results to check out stability or resolution.    Orders: -     Basic metabolic panel  MCI (mild cognitive impairment) with memory loss Assessment & Plan: Ongoing. F/u with neurology as scheduled.        Follow up plan: Return in about 2 months (around 01/29/2024) for f/u diabetes.  Mort Sawyers, FNP

## 2023-11-28 NOTE — Patient Instructions (Addendum)
  Restart trulicity 0.75 mg once weekly.  PLEASE LET ME KNOW IF YOU DO NOT GET THIS FILLED.   ------------------------------------  Start some over the counter vitamin B12 500 mcg once daily.   ------------------------------------  Talk with psychiatry about sleep concerns.  We will also work on changing gabapentin as instructed below to see if this is helpful as well.   Please work on the following to help with sleep:  -Sleep only long enough to feel rested then get out of bed -Go to bed and get up at the same time every day. -Do not try to force yourself to sleep. If you can't sleep, get out of bed adn try again later. -Have coffee, tea, and other foods that have caffeine only in the morning. -Avoid alcohol -Keep your bedroom dark, cool, quiet, and free of reminders of work or other things that cause you stress -Exercise several days a week, but not right before bed -Avoid looking at phones or reading devices ("e-books") that give off light before bed. This can make it harder to fall asleep   ------------------------------------ Restart furosemide 20 mg once daily.   ------------------------------------  Change gabapentin administration  Take one tablet in the am, take one tablet mid day and then two tablets at night time  300 mg in am, 300 mg mid day and 600 mg (two tablets) at night time.    ------------------------------------

## 2023-11-30 DIAGNOSIS — Z8709 Personal history of other diseases of the respiratory system: Secondary | ICD-10-CM | POA: Insufficient documentation

## 2023-11-30 DIAGNOSIS — J90 Pleural effusion, not elsewhere classified: Secondary | ICD-10-CM | POA: Insufficient documentation

## 2023-11-30 NOTE — Assessment & Plan Note (Signed)
Ongoing. F/u with neurology as scheduled.

## 2023-11-30 NOTE — Assessment & Plan Note (Addendum)
Cont furosemide, pt has not been taking and edema worsening Compression stockings daily with elevation of legs  Maintain low sodium diet.  Pleural effusion on cxr in past, repeat cxr pending results to check out stability or resolution.

## 2023-11-30 NOTE — Assessment & Plan Note (Signed)
Not at goal < 7  Issues with Trulicity  Discussed with pt, will fill again pt to keep me in loop and communicate if unable to get filled.  Discussed compliance.  Cont metformin xr 500 mg two tablets twice daily

## 2023-11-30 NOTE — Assessment & Plan Note (Signed)
Stable.  Continue medications as prescribed. 

## 2023-12-03 ENCOUNTER — Other Ambulatory Visit
Admission: RE | Admit: 2023-12-03 | Discharge: 2023-12-03 | Disposition: A | Discharge status: Home / Self Care | Payer: PPO | Source: Ambulatory Visit | Attending: Pulmonary Disease | Admitting: Pulmonary Disease

## 2023-12-03 ENCOUNTER — Ambulatory Visit: Payer: PPO | Admitting: Pulmonary Disease

## 2023-12-03 VITALS — BP 132/78 | HR 63 | Temp 98.3°F | Ht 71.0 in | Wt 290.0 lb

## 2023-12-03 DIAGNOSIS — I2729 Other secondary pulmonary hypertension: Secondary | ICD-10-CM | POA: Diagnosis not present

## 2023-12-03 DIAGNOSIS — G4733 Obstructive sleep apnea (adult) (pediatric): Secondary | ICD-10-CM

## 2023-12-03 DIAGNOSIS — I272 Pulmonary hypertension, unspecified: Secondary | ICD-10-CM

## 2023-12-03 LAB — BRAIN NATRIURETIC PEPTIDE: B Natriuretic Peptide: 46 pg/mL (ref 0.0–100.0)

## 2023-12-03 NOTE — Progress Notes (Signed)
Pulmonary

## 2023-12-03 NOTE — Progress Notes (Signed)
Synopsis: Referred in by Mort Sawyers, FNP   Subjective:   PATIENT ID: Edward King GENDER: male DOB: Sep 19, 1951, MRN: [id number]  Chief Complaint  Patient presents with   pulmonary consult    No current sx.     HPI Mr. Barriere is a 72 year old male patient with a past medical history of hypertension, morbid obesity, CVA, OSA not compliant with CPAP, pleural effusion resolved on lasix presenting today to the pulmonary clinic as a referral from Mort Sawyers FNP for the evaluation of pulmonary hypertension seen on recent echocardiogram.   He reports recent admission for a fall in September 2024 complicated by left fourth sixth seventh eighth and ninth ribs acute fracture. Reports feeling dizzy prior to the fall but never lost consciousness. This has not happened to him before.   He recovered well and a follow up CXR end of September showed a new left pleural effusion as well as woresning LE edema. He was started on lasix.   Echocardiogram 10/31/2023 normal LVEF 60 to 65% no rwm abn. Grade I diastolic dysfunction. RV systolic function is normal. Moderately elevated PASP 46 mmHg.   CT scan of the chest 09/10 - no significant parenchymal lung disease.   He denies any systemic symptoms such as Dysphagia, raynauds, joint stiffness or rash. Denies any shortness of breath or dyspnea. Only complains of vivid disturbing dreams that are affecting his sleep.   Family history - No family history of pulmonary disease.   Social history - Never smoker   ROS All systems were reviewed and are negative except for the above.  Objective:   Vitals:   12/03/23 0931  BP: 132/78  Pulse: 63  Temp: 98.3 F (36.8 C)  TempSrc: Oral  SpO2: 96%  Weight: 290 lb (131.5 kg)  Height: 5\' 11"  (1.803 m)   96% on RA BMI Readings from Last 3 Encounters:  12/03/23 40.45 kg/m  11/28/23 41.23 kg/m  10/17/23 40.06 kg/m   Wt Readings from Last 3 Encounters:  12/03/23 290 lb (131.5 kg)  11/28/23 295  lb 9.6 oz (134.1 kg)  10/17/23 287 lb 3.2 oz (130.3 kg)    Physical Exam GEN: NAD, Healthy Appearing HEENT: Supple Neck, Reactive Pupils, EOMI  CVS: Normal S1, Normal S2, RRR, No murmurs or ES appreciated  Lungs: Clear bilateral air entry.  Abdomen: Soft, non tender, non distended, + BS  Extremities: Warm and well perfused, No edema  Skin: No suspicious lesions appreciated  Psych: Normal Affect  Ancillary Information   CBC    Component Value Date/Time   WBC 9.2 11/28/2023 1206   RBC 4.86 11/28/2023 1206   HGB 13.5 11/28/2023 1206   HGB 13.6 05/05/2022 1529   HCT 42.0 11/28/2023 1206   HCT 41.6 05/05/2022 1529   PLT 252.0 11/28/2023 1206   PLT 279 05/05/2022 1529   MCV 86.5 11/28/2023 1206   MCV 83 05/05/2022 1529   MCV 87 08/30/2014 0450   MCH 28.6 08/30/2023 0406   MCHC 32.1 11/28/2023 1206   RDW 15.0 11/28/2023 1206   RDW 14.0 05/05/2022 1529   RDW 15.0 (H) 08/30/2014 0450   LYMPHSABS 1.1 08/30/2023 0406   LYMPHSABS 1.3 05/05/2022 1529   LYMPHSABS 0.9 (L) 08/30/2014 0450   MONOABS 0.7 08/30/2023 0406   MONOABS 0.1 (L) 08/30/2014 0450   EOSABS 0.1 08/30/2023 0406   EOSABS 0.3 05/05/2022 1529   EOSABS 0.0 08/30/2014 0450   BASOSABS 0.0 08/30/2023 0406   BASOSABS 0.0 05/05/2022 1529  BASOSABS 0.0 08/30/2014 0450       No data to display           Assessment & Plan:  Mr. Goebel is a 72 year old male patient with a past medical history of hypertension, morbid obesity, CVA, OSA not compliant with CPAP, pleural effusion resolved on lasix presenting today to the pulmonary clinic as a referral from Mort Sawyers FNP for the evaluation of pulmonary hypertension seen on recent echocardiogram.  His echocardiogram in November does show moderately elevated pulmonary hypertension. He is asymptomatic from a respiratory stand point but did have a presyncopal event leading to a fall 3 months ago which could be a symptom of PH. He does have worsening LE edema and left  pleural effusion. However his Echo with normal RV function and size. PASP at 42 mmhg. TAPSE 3.4cm. 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 Although less likely to be, I will send a basic autoimmune panel, order a PFT and a perfusion scan. The main stay of his treatment will be diuresis and compliance with cpap for now. I will consider referral for RHC once the above results. WHO fct I or II.  I am not sure where his vivid dreams are coming from, he is seeing a psychiatrist, I will place a referral for sleep medicine.   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   Return in about 3 months (around 03/02/2024).  I spent 60 minutes caring for this patient today, including preparing to see the patient, obtaining a medical history , reviewing a separately obtained history, performing a medically appropriate examination and/or evaluation, counseling and educating the patient/family/caregiver, ordering medications, tests, or procedures, documenting clinical information in the electronic health record, and independently interpreting results (not separately reported/billed) and communicating results to the patient/family/caregiver  Janann Colonel, MD [facility] 12/03/2023 4:59 PM

## 2023-12-04 LAB — CYCLIC CITRUL PEPTIDE ANTIBODY, IGG/IGA: CCP Antibodies IgG/IgA: 9 U (ref 0–19)

## 2023-12-04 LAB — RHEUMATOID FACTOR: Rheumatoid fact SerPl-aCnc: 10.2 [IU]/mL (ref <14.0)

## 2023-12-05 ENCOUNTER — Ambulatory Visit
Admission: RE | Admit: 2023-12-05 | Discharge: 2023-12-05 | Disposition: A | Discharge status: Home / Self Care | Payer: PPO | Source: Ambulatory Visit | Attending: Pulmonary Disease | Admitting: Pulmonary Disease

## 2023-12-05 ENCOUNTER — Encounter
Admission: RE | Admit: 2023-12-05 | Discharge: 2023-12-05 | Disposition: A | Discharge status: Home / Self Care | Payer: PPO | Source: Ambulatory Visit | Attending: Pulmonary Disease | Admitting: Pulmonary Disease

## 2023-12-05 DIAGNOSIS — I2729 Other secondary pulmonary hypertension: Secondary | ICD-10-CM | POA: Insufficient documentation

## 2023-12-05 DIAGNOSIS — R0989 Other specified symptoms and signs involving the circulatory and respiratory systems: Secondary | ICD-10-CM | POA: Diagnosis not present

## 2023-12-05 DIAGNOSIS — R9389 Abnormal findings on diagnostic imaging of other specified body structures: Secondary | ICD-10-CM | POA: Diagnosis not present

## 2023-12-05 DIAGNOSIS — I272 Pulmonary hypertension, unspecified: Secondary | ICD-10-CM | POA: Diagnosis not present

## 2023-12-05 MED ORDER — TECHNETIUM TO 99M ALBUMIN AGGREGATED
4.0000 | Freq: Once | INTRAVENOUS | Status: AC | PRN
  Administered 2023-12-05: 4.24 via INTRAVENOUS

## 2023-12-14 ENCOUNTER — Other Ambulatory Visit: Payer: Self-pay | Admitting: Family

## 2023-12-14 DIAGNOSIS — E1149 Type 2 diabetes mellitus with other diabetic neurological complication: Secondary | ICD-10-CM

## 2023-12-15 LAB — MISC LABCORP TEST (SEND OUT): Labcorp test code: 520180

## 2023-12-17 DIAGNOSIS — R42 Dizziness and giddiness: Secondary | ICD-10-CM | POA: Diagnosis not present

## 2023-12-17 DIAGNOSIS — Z8719 Personal history of other diseases of the digestive system: Secondary | ICD-10-CM | POA: Diagnosis not present

## 2023-12-17 DIAGNOSIS — G4730 Sleep apnea, unspecified: Secondary | ICD-10-CM | POA: Diagnosis not present

## 2023-12-17 DIAGNOSIS — M316 Other giant cell arteritis: Secondary | ICD-10-CM | POA: Diagnosis not present

## 2023-12-17 DIAGNOSIS — I2089 Other forms of angina pectoris: Secondary | ICD-10-CM | POA: Diagnosis not present

## 2023-12-17 DIAGNOSIS — R0602 Shortness of breath: Secondary | ICD-10-CM | POA: Diagnosis not present

## 2023-12-17 DIAGNOSIS — E66813 Obesity, class 3: Secondary | ICD-10-CM | POA: Diagnosis not present

## 2023-12-17 DIAGNOSIS — I10 Essential (primary) hypertension: Secondary | ICD-10-CM | POA: Diagnosis not present

## 2023-12-17 DIAGNOSIS — I639 Cerebral infarction, unspecified: Secondary | ICD-10-CM | POA: Diagnosis not present

## 2023-12-17 DIAGNOSIS — G4733 Obstructive sleep apnea (adult) (pediatric): Secondary | ICD-10-CM | POA: Diagnosis not present

## 2023-12-17 DIAGNOSIS — E119 Type 2 diabetes mellitus without complications: Secondary | ICD-10-CM | POA: Diagnosis not present

## 2023-12-17 DIAGNOSIS — E669 Obesity, unspecified: Secondary | ICD-10-CM | POA: Diagnosis not present

## 2023-12-24 ENCOUNTER — Telehealth: Payer: Self-pay

## 2023-12-24 ENCOUNTER — Encounter: Payer: Self-pay | Admitting: Pulmonary Disease

## 2023-12-24 ENCOUNTER — Other Ambulatory Visit: Payer: Self-pay

## 2023-12-24 DIAGNOSIS — Z800 Family history of malignant neoplasm of digestive organs: Secondary | ICD-10-CM

## 2023-12-24 DIAGNOSIS — Z1211 Encounter for screening for malignant neoplasm of colon: Secondary | ICD-10-CM

## 2023-12-24 MED ORDER — GOLYTELY 236 G PO SOLR: 4000.0000 mL | ORAL | 0 refills | Status: AC

## 2023-12-24 NOTE — Telephone Encounter (Signed)
 The patient called in to schedule an repeat colonoscopy with Dr. Tobi Bastos.

## 2023-12-24 NOTE — Telephone Encounter (Signed)
 Gastroenterology Pre-Procedure Review  Request Date: 01/10/24 Requesting Physician: Dr. Therisa  PATIENT REVIEW QUESTIONS: The patient responded to the following health history questions as indicated:    1. Are you having any GI issues? no 2. Do you have a personal history of Polyps? no 3. Do you have a family history of Colon Cancer or Polyps? yes (mother colon cancer) 4. Diabetes Mellitus? yes (noted and verbalized to patient to stop trulicity  7 days prior to colonoscopy, stop metformin  2 days prior to colonoscopy, stop jardiance  3 days before colonoscopy) 5. Joint replacements in the past 12 months?no 6. Major health problems in the past 3 months?no 7. Any artificial heart valves, MVP, or defibrillator?no    MEDICATIONS & ALLERGIES:    Patient reports the following regarding taking any anticoagulation/antiplatelet therapy:   Plavix, Coumadin, Eliquis, Xarelto, Lovenox , Pradaxa, Brilinta, or Effient? no Aspirin ? yes (81 mg daily)  Patient confirms/reports the following medications:  Current Outpatient Medications  Medication Sig Dispense Refill   ALEVAZOL 1 % OINT APPLY 1 APPLICATION TOPICALLY 2 (TWO) TIMES DAILY FOR 14 DAYS. 56.7 g 0   aspirin  EC 81 MG tablet Take 1 tablet (81 mg total) by mouth daily. Swallow whole. 90 tablet 1   Blood Glucose Monitoring Suppl (CONTOUR NEXT ONE) KIT Use to check blood sugar daily 1 kit 0   buPROPion  (WELLBUTRIN  SR) 150 MG 12 hr tablet Take 150 mg by mouth daily.     clonazePAM  (KLONOPIN ) 0.5 MG tablet Take 0.5 mg by mouth 3 (three) times daily as needed for anxiety.     Coenzyme Q10 (COQ-10) 100 MG CAPS Take 100 mg by mouth daily.     cyanocobalamin  (VITAMIN B12) 1000 MCG tablet Take 1,000 mcg by mouth daily.     donepezil  (ARICEPT ) 5 MG tablet Take 5 mg by mouth every morning.     Dulaglutide  (TRULICITY ) 0.75 MG/0.5ML SOAJ Inject 0.75 mg into the skin once a week. 2 mL 1   furosemide  (LASIX ) 20 MG tablet Take 1 tablet (20 mg total) by mouth daily.  90 tablet 0   gabapentin  (NEURONTIN ) 300 MG capsule TAKE 1 BY MOUTH EVERY DAY IN THE MORNING, 1 AT MID DAY, THEN 2 NIGHTLY 120 capsule 2   glucose blood (CONTOUR NEXT TEST) test strip Use to check blood sugar daily 100 each 3   mesalamine  (APRISO ) 0.375 g 24 hr capsule Take 375 mg by mouth daily.     metFORMIN  (GLUCOPHAGE -XR) 500 MG 24 hr tablet TAKE 2 TABLETS BY MOUTH TWICE A DAY 360 tablet 4   mirtazapine  (REMERON ) 30 MG tablet Take 30 mg by mouth at bedtime.     pravastatin  (PRAVACHOL ) 40 MG tablet Take 1 tablet (40 mg total) by mouth daily with lunch. 90 tablet 3   tamsulosin  (FLOMAX ) 0.4 MG CAPS capsule Take 0.4 mg by mouth daily.     triamcinolone  cream (KENALOG ) 0.1 % Apply 1 Application topically 2 (two) times daily. 30 g 0   valsartan  (DIOVAN ) 40 MG tablet TAKE 1 TABLET BY MOUTH EVERY DAY 90 tablet 1   venlafaxine  XR (EFFEXOR -XR) 150 MG 24 hr capsule Take 150 mg by mouth daily with breakfast.     Vitamin D , Cholecalciferol , 1000 units TABS Take 2,000 Units by mouth daily.     No current facility-administered medications for this visit.    Patient confirms/reports the following allergies:  Allergies  Allergen Reactions   Jardiance  [Empagliflozin ] Other (See Comments)    balanitis    No orders of the  defined types were placed in this encounter.   AUTHORIZATION INFORMATION Primary Insurance: 1D#: Group #:  Secondary Insurance: 1D#: Group #:  SCHEDULE INFORMATION: Date: 01/10/24 Time: Location: [facility]

## 2024-01-01 DIAGNOSIS — F418 Other specified anxiety disorders: Secondary | ICD-10-CM | POA: Diagnosis not present

## 2024-01-01 DIAGNOSIS — F331 Major depressive disorder, recurrent, moderate: Secondary | ICD-10-CM | POA: Diagnosis not present

## 2024-01-03 ENCOUNTER — Encounter: Payer: Self-pay | Admitting: Family

## 2024-01-03 ENCOUNTER — Telehealth: Payer: Self-pay

## 2024-01-03 ENCOUNTER — Ambulatory Visit (INDEPENDENT_AMBULATORY_CARE_PROVIDER_SITE_OTHER): Payer: PPO | Admitting: Family

## 2024-01-03 ENCOUNTER — Ambulatory Visit: Payer: PPO | Admitting: Urology

## 2024-01-03 VITALS — BP 146/88 | HR 90 | Temp 97.6°F | Ht 71.0 in | Wt 287.6 lb

## 2024-01-03 VITALS — BP 127/81 | HR 71 | Ht 71.0 in | Wt 286.4 lb

## 2024-01-03 DIAGNOSIS — R350 Frequency of micturition: Secondary | ICD-10-CM | POA: Diagnosis not present

## 2024-01-03 DIAGNOSIS — E1149 Type 2 diabetes mellitus with other diabetic neurological complication: Secondary | ICD-10-CM

## 2024-01-03 DIAGNOSIS — E782 Mixed hyperlipidemia: Secondary | ICD-10-CM

## 2024-01-03 DIAGNOSIS — Z7984 Long term (current) use of oral hypoglycemic drugs: Secondary | ICD-10-CM | POA: Diagnosis not present

## 2024-01-03 DIAGNOSIS — E785 Hyperlipidemia, unspecified: Secondary | ICD-10-CM

## 2024-01-03 DIAGNOSIS — I10 Essential (primary) hypertension: Secondary | ICD-10-CM

## 2024-01-03 DIAGNOSIS — N401 Enlarged prostate with lower urinary tract symptoms: Secondary | ICD-10-CM | POA: Diagnosis not present

## 2024-01-03 DIAGNOSIS — E1142 Type 2 diabetes mellitus with diabetic polyneuropathy: Secondary | ICD-10-CM | POA: Diagnosis not present

## 2024-01-03 LAB — URINALYSIS, COMPLETE
Bilirubin, UA: NEGATIVE
Glucose, UA: NEGATIVE
Ketones, UA: NEGATIVE
Leukocytes,UA: NEGATIVE
Nitrite, UA: NEGATIVE
Protein,UA: NEGATIVE
RBC, UA: NEGATIVE
Specific Gravity, UA: 1.025 (ref 1.005–1.030)
Urobilinogen, Ur: 0.2 mg/dL (ref 0.2–1.0)
pH, UA: 5.5 (ref 5.0–7.5)

## 2024-01-03 LAB — MICROSCOPIC EXAMINATION

## 2024-01-03 LAB — LIPID PANEL
Cholesterol: 149 mg/dL (ref 0–200)
HDL: 57.7 mg/dL (ref >39.00)
LDL Cholesterol: 74 mg/dL (ref 0–99)
NonHDL: 91.35
Total CHOL/HDL Ratio: 3
Triglycerides: 87 mg/dL (ref 0.0–149.0)
VLDL: 17.4 mg/dL (ref 0.0–40.0)

## 2024-01-03 LAB — MICROALBUMIN / CREATININE URINE RATIO
Creatinine,U: 132.3 mg/dL
Microalb Creat Ratio: 0.5 mg/g (ref 0.0–30.0)
Microalb, Ur: <0.7 mg/dL (ref 0.0–1.9)

## 2024-01-03 LAB — HEMOGLOBIN A1C: Hgb A1c MFr Bld: 7.5 % — ABNORMAL HIGH (ref 4.6–6.5)

## 2024-01-03 LAB — BLADDER SCAN AMB NON-IMAGING: Scan Result: 13

## 2024-01-03 MED ORDER — TRULICITY 0.75 MG/0.5ML ~~LOC~~ SOAJ: 0.7500 mg | SUBCUTANEOUS | 1 refills | Status: DC

## 2024-01-03 MED ORDER — OXYBUTYNIN CHLORIDE ER 5 MG PO TB24: 5.0000 mg | ORAL_TABLET | Freq: Every day | ORAL | 11 refills | Status: DC

## 2024-01-03 NOTE — Patient Instructions (Signed)
  Call pulmonary to ask about the recent results of the imaging that was done.

## 2024-01-03 NOTE — Telephone Encounter (Signed)
The patient called in to schedule an office visit.

## 2024-01-03 NOTE — Progress Notes (Signed)
Edward King,acting as a scribe for Vanna Scotland, MD.,have documented all relevant documentation on the behalf of Vanna Scotland, MD,as directed by  Vanna Scotland, MD while in the presence of Vanna Scotland, MD.  01/03/24 12:39 PM   Dola Argyle Villanueva Aug 30, 1951 [id number]  Referring provider: Mort Sawyers, FNP [address] [place],  [place] [address]  Chief Complaint  Patient presents with   Benign Prostatic Hypertrophy    HPI: 73 year-old male who returns today for follow up. Please see previous notes for details. He was supposed to be seen again shortly thereafter but failed to return.   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   At the visit, he was started on Flomax, but he called and said it was ineffective. He switched to oxybutynin 15 mg XL and has not been seen since.   Since his last visit, he was diagnosed with sleep apnea.   Today, he reports ongoing issues with urinary urgency and frequency. He reports that the oxybutynin caused urinary retention. He notes that he wakes up several times a night to urinate but returns to sleep easily. He stopped and returned to the Flomax, which he feels has helped some.  Results for orders placed or performed in visit on 01/03/24  Bladder Scan (Post Void Residual) in office  Result Value Ref Range   Scan Result 13 ml     IPSS     Row Name 01/03/24 1000         International Prostate Symptom Score   How often have you had the sensation of not emptying your bladder? About half the time     How often have you had to urinate less than every two hours? About half the time     How often have you found you stopped and started again several times when you urinated? Not at All     How often have you found it difficult to postpone urination? Not at All     How often have you had a weak urinary stream?  Less than 1 in 5 times     How often have you had to strain to start urination? Not at All     How many times did you typically get up at night to urinate? 2 Times     Total IPSS Score 9       Quality of Life due to urinary symptoms   If you were to spend the rest of your life with your urinary condition just the way it is now how would you feel about that? Mostly Satisfied              Score:  1-7 Mild 8-19 Moderate 20-35 Severe   PMH: Past Medical History:  Diagnosis Date   Arthritis    Cat bite 07/31/2018   Cellulitis of right leg without foot    Colitis    Depression    Diabetes mellitus without complication (HCC)    Giant cell arteritis (HCC) 06/15/2015   History of chicken pox    History of measles    History of mumps    Hypertension    Lymph edema    both legs.  mostly left   Neuromuscular disorder (HCC)    Prostate CA (HCC) 2016   Radiation seed implants.   Stroke [facility])    Temporal arteritis (HCC)  Clinically dx Dr. Gavin Potters. Negative temporal artrtery bx   Wears hearing aid in both ears     Surgical History: Past Surgical History:  Procedure Laterality Date   ABDOMINOPLASTY     Carotid Doppler Ultrasound  08/22/2014   [facility]; Minimal plaque right. Minimal thisckening left Anterograde flow vertebrals   CATARACT EXTRACTION W/PHACO Right 07/03/2022   Procedure: CATARACT EXTRACTION PHACO AND INTRAOCULAR LENS PLACEMENT (IOC) RIGHT;  Surgeon: Nevada Crane, MD;  Location: [facility];  Service: Ophthalmology;  Laterality: Right;  Diabetic 2.36 00:18.8   CATARACT EXTRACTION W/PHACO Left 07/17/2022   Procedure: CATARACT EXTRACTION PHACO AND INTRAOCULAR LENS PLACEMENT (IOC) LEFT DIABETIC;  Surgeon: Nevada Crane, MD;  Location: [facility];  Service: Ophthalmology;  Laterality: Left;  Diabetic 3.06 00:23.1   COLONOSCOPY WITH PROPOFOL N/A 02/13/2018   Procedure: COLONOSCOPY WITH PROPOFOL;  Surgeon: Scot Jun, MD;  Location:  [facility] ENDOSCOPY;  Service: Endoscopy;  Laterality: N/A;   COLONOSCOPY WITH PROPOFOL N/A 05/22/2023   Procedure: COLONOSCOPY WITH PROPOFOL;  Surgeon: [name], MD;  Location: [facility] ENDOSCOPY;  Service: Gastroenterology;  Laterality: N/A;   CT of the head  08/22/2014   [facility]. Normal   DOPPLER ECHOCARDIOGRAPHY  08/29/2014   EF=60-65%. Normal LVEF   EYE SURGERY     Myocardial Perfusion Scan  09/10/2012   Poor exercsie tolerance, but no evidence of stress induced myocardial ischemia. EF=66%. Dr. Juliann Pares   NASAL SEPTOPLASTY W/ TURBINOPLASTY Bilateral 02/10/2016   Procedure: NASAL SEPTOPLASTY WITH INFERIOR TURBINATE REDUCTION;  Surgeon: Bud Face, MD;  Location: [facility] ORS;  Service: ENT;  Laterality: Bilateral;   pannectomy     TEMPORAL ARTERY BIOPSY / LIGATION Left     Home Medications:  Allergies as of 01/03/2024       Reactions   Jardiance [empagliflozin] Other (See Comments)   balanitis        Medication List        Accurate as of January 03, 2024 12:39 PM. If you have any questions, ask your nurse or doctor.          Alevazol 1 % Oint Generic drug: Clotrimazole APPLY 1 APPLICATION TOPICALLY 2 (TWO) TIMES DAILY FOR 14 DAYS.   aspirin EC 81 MG tablet Take 1 tablet (81 mg total) by mouth daily. Swallow whole.   buPROPion 150 MG 12 hr tablet Commonly known as: WELLBUTRIN SR Take 150 mg by mouth daily.   clonazePAM 0.5 MG tablet Commonly known as: KLONOPIN Take 0.5 mg by mouth 3 (three) times daily as needed for anxiety.   Contour Next One Kit Use to check blood sugar daily   Contour Next Test test strip Generic drug: glucose blood Use to check blood sugar daily   CoQ-10 100 MG Caps Take 100 mg by mouth daily.   cyanocobalamin 1000 MCG tablet Commonly known as: VITAMIN B12 Take 1,000 mcg by mouth daily.   donepezil 5 MG tablet Commonly known as: ARICEPT Take 5 mg by mouth every morning.   furosemide 20 MG tablet Commonly known as: LASIX Take 1  tablet (20 mg total) by mouth daily.   gabapentin 300 MG capsule Commonly known as: NEURONTIN TAKE 1 BY MOUTH EVERY DAY IN THE MORNING, 1 AT MID DAY, THEN 2 NIGHTLY   mesalamine 0.375 g 24 hr capsule Commonly known as: APRISO Take 375 mg by mouth daily.   metFORMIN 500 MG 24 hr tablet Commonly known as: GLUCOPHAGE-XR TAKE 2 TABLETS BY MOUTH TWICE A DAY   mirtazapine  30 MG tablet Commonly known as: REMERON Take 30 mg by mouth at bedtime.   oxybutynin 5 MG 24 hr tablet Commonly known as: DITROPAN-XL Take 1 tablet (5 mg total) by mouth at bedtime. Started by: Vanna Scotland   pravastatin 40 MG tablet Commonly known as: PRAVACHOL Take 1 tablet (40 mg total) by mouth daily with lunch.   QUEtiapine 25 MG tablet Commonly known as: SEROQUEL Take 25 mg by mouth at bedtime.   tamsulosin 0.4 MG Caps capsule Commonly known as: FLOMAX Take 0.4 mg by mouth daily.   triamcinolone cream 0.1 % Commonly known as: KENALOG Apply 1 Application topically 2 (two) times daily.   Trulicity 0.75 MG/0.5ML Soaj Generic drug: Dulaglutide Inject 0.75 mg into the skin once a week.   valsartan 40 MG tablet Commonly known as: DIOVAN TAKE 1 TABLET BY MOUTH EVERY DAY   venlafaxine XR 150 MG 24 hr capsule Commonly known as: EFFEXOR-XR Take 150 mg by mouth daily with breakfast.   Vitamin D (Cholecalciferol) 25 MCG (1000 UT) Tabs Take 2,000 Units by mouth daily.        Allergies:  Allergies  Allergen Reactions   Jardiance [Empagliflozin] Other (See Comments)    balanitis    Family History: Family History  Problem Relation Age of Onset   Colon cancer Mother    Coronary artery disease Mother    Prostate cancer Father    Kidney disease Father    Dementia Brother    Diabetes Neg Hx     Social History:  reports that he has never smoked. He has never used smokeless tobacco. He reports that he does not currently use alcohol. He reports that he does not use drugs.   Physical  Exam: BP 127/81   Pulse 71   Ht 5\' 11"  (1.803 m)   Wt 286 lb 6 oz (129.9 kg)   BMI 39.94 kg/m   Constitutional:  Alert and oriented, No acute distress. HEENT: [place] AT, moist mucus membranes.  Trachea midline, no masses. Neurologic: Grossly intact, no focal deficits, moving all 4 extremities. Psychiatric: Normal mood and affect.   Assessment & Plan:    1. Urinary frequency and urgency - Continue Tamsulosin for urinary flow.  - Initiate Oxybutynin 5mg  XL to address urgency and frequency.  - Monitor for urinary retention.  Return in about 3 months (around 04/02/2024) for PA visit for IPSS, PVR, and reassessment of symptoms.  I have reviewed the above documentation for accuracy and completeness, and I agree with the above.   Vanna Scotland, MD   [facility] [address] [place], [place] [address] [phone]

## 2024-01-03 NOTE — Progress Notes (Signed)
Established Patient Office Visit  Subjective:      CC:    HPI: Edward King is a 73 y.o. male presenting on 01/03/2024 for Medical Management of Chronic Issues   DM2: has not yet filled the trulicity due to insurance issues. Will try again as in the new year with medicare as had been in the donut hole. Is taking metformin XR 500 mg twice daily tolerating well. Has issues with SGLT2s with recurrent balanitis.   HLD: taking pravastatin 40 mg nightly. Tolerating well.       Social history:  Relevant past medical, surgical, family and social history reviewed and updated as indicated. Interim medical history since our last visit reviewed.  Allergies and medications reviewed and updated.  DATA REVIEWED: CHART IN EPIC     ROS: Negative unless specifically indicated above in HPI.    Current Outpatient Medications:    ALEVAZOL 1 % OINT, APPLY 1 APPLICATION TOPICALLY 2 (TWO) TIMES DAILY FOR 14 DAYS., Disp: 56.7 g, Rfl: 0   aspirin EC 81 MG tablet, Take 1 tablet (81 mg total) by mouth daily. Swallow whole., Disp: 90 tablet, Rfl: 1   Blood Glucose Monitoring Suppl (CONTOUR NEXT ONE) KIT, Use to check blood sugar daily, Disp: 1 kit, Rfl: 0   buPROPion (WELLBUTRIN SR) 150 MG 12 hr tablet, Take 150 mg by mouth daily., Disp: , Rfl:    clonazePAM (KLONOPIN) 0.5 MG tablet, Take 0.5 mg by mouth 3 (three) times daily as needed for anxiety., Disp: , Rfl:    Coenzyme Q10 (COQ-10) 100 MG CAPS, Take 100 mg by mouth daily., Disp: , Rfl:    cyanocobalamin (VITAMIN B12) 1000 MCG tablet, Take 1,000 mcg by mouth daily., Disp: , Rfl:    donepezil (ARICEPT) 5 MG tablet, Take 5 mg by mouth every morning., Disp: , Rfl:    furosemide (LASIX) 20 MG tablet, Take 1 tablet (20 mg total) by mouth daily., Disp: 90 tablet, Rfl: 0   gabapentin (NEURONTIN) 300 MG capsule, TAKE 1 BY MOUTH EVERY DAY IN THE MORNING, 1 AT MID DAY, THEN 2 NIGHTLY, Disp: 120 capsule, Rfl: 2   glucose blood (CONTOUR NEXT TEST) test  strip, Use to check blood sugar daily, Disp: 100 each, Rfl: 3   mesalamine (APRISO) 0.375 g 24 hr capsule, Take 375 mg by mouth daily., Disp: , Rfl:    metFORMIN (GLUCOPHAGE-XR) 500 MG 24 hr tablet, TAKE 2 TABLETS BY MOUTH TWICE A DAY, Disp: 360 tablet, Rfl: 4   mirtazapine (REMERON) 30 MG tablet, Take 30 mg by mouth at bedtime., Disp: , Rfl:    oxybutynin (DITROPAN-XL) 5 MG 24 hr tablet, Take 1 tablet (5 mg total) by mouth at bedtime., Disp: 30 tablet, Rfl: 11   pravastatin (PRAVACHOL) 40 MG tablet, Take 1 tablet (40 mg total) by mouth daily with lunch., Disp: 90 tablet, Rfl: 3   QUEtiapine (SEROQUEL) 25 MG tablet, Take 25 mg by mouth at bedtime., Disp: , Rfl:    tamsulosin (FLOMAX) 0.4 MG CAPS capsule, Take 0.4 mg by mouth daily., Disp: , Rfl:    triamcinolone cream (KENALOG) 0.1 %, Apply 1 Application topically 2 (two) times daily., Disp: 30 g, Rfl: 0   valsartan (DIOVAN) 40 MG tablet, TAKE 1 TABLET BY MOUTH EVERY DAY, Disp: 90 tablet, Rfl: 1   venlafaxine XR (EFFEXOR-XR) 150 MG 24 hr capsule, Take 150 mg by mouth daily with breakfast., Disp: , Rfl:    Vitamin D, Cholecalciferol, 1000 units TABS, Take 2,000  Units by mouth daily., Disp: , Rfl:    Dulaglutide (TRULICITY) 0.75 MG/0.5ML SOAJ, Inject 0.75 mg into the skin once a week., Disp: 2 mL, Rfl: 1      Objective:    BP (!) 146/88   Pulse 90   Temp 97.6 F (36.4 C) (Temporal)   Ht 5\' 11"  (1.803 m)   Wt 287 lb 9.6 oz (130.5 kg)   SpO2 95%   BMI 40.11 kg/m   Wt Readings from Last 3 Encounters:  01/03/24 287 lb 9.6 oz (130.5 kg)  01/03/24 286 lb 6 oz (129.9 kg)  12/03/23 290 lb (131.5 kg)    Physical Exam Constitutional:      General: He is not in acute distress.    Appearance: Normal appearance. He is obese. He is not ill-appearing, toxic-appearing or diaphoretic.  Cardiovascular:     Rate and Rhythm: Normal rate and regular rhythm.  Pulmonary:     Effort: Pulmonary effort is normal.  Musculoskeletal:        General:  Normal range of motion.  Neurological:     General: No focal deficit present.     Mental Status: He is alert and oriented to person, place, and time. Mental status is at baseline.  Psychiatric:        Mood and Affect: Mood normal.        Behavior: Behavior normal.        Thought Content: Thought content normal.        Judgment: Judgment normal.            Lab Results  Component Value Date   HGBA1C 7.5 (H) 01/03/2024      Assessment & Plan:  Essential hypertension Assessment & Plan: Stable Continue diovan 40 mg once daily.   Type 2 diabetes mellitus with peripheral neuropathy (HCC) -     Microalbumin / creatinine urine ratio -     Hemoglobin A1c  Mixed hyperlipidemia -     Lipid panel  Type 2 diabetes mellitus with other neurologic complication, without long-term current use of insulin (HCC) Assessment & Plan: Pt still non compliant with medication due to insurance.  Resubmitting trulicity for the new year to see if now covered, pending insurance.  Pt to continue with metformin 500 mg two tabelts twice daily , side effects with SGLT2. If GLP not covered can consider DPP 4 A1c and urine m/a ordered pending results   Orders: -     Trulicity; Inject 0.75 mg into the skin once a week.  Dispense: 2 mL; Refill: 1  Dyslipidemia Assessment & Plan: Ordered lipid panel, pending results. Work on low cholesterol diet and exercise as tolerated Continue pravastatin 40 mg nightly       Return in about 3 months (around 04/02/2024).  Mort Sawyers, MSN, APRN, FNP-C [facility]

## 2024-01-07 NOTE — Assessment & Plan Note (Signed)
Stable Continue diovan 40 mg once daily.

## 2024-01-07 NOTE — Assessment & Plan Note (Signed)
Pt still non compliant with medication due to insurance.  Resubmitting trulicity for the new year to see if now covered, pending insurance.  Pt to continue with metformin 500 mg two tabelts twice daily , side effects with SGLT2. If GLP not covered can consider DPP 4 A1c and urine m/a ordered pending results

## 2024-01-07 NOTE — Assessment & Plan Note (Signed)
Ordered lipid panel, pending results. Work on low cholesterol diet and exercise as tolerated Continue pravastatin 40 mg nightly

## 2024-01-08 NOTE — Progress Notes (Deleted)
Celso [name], PA-C [address]  [address]  [place], [place] [address]  Main: [phone]  Fax: [phone]   Gastroenterology Consultation  Referring Provider:     Mort Sawyers, FNP Primary Care Physician:  Mort Sawyers, FNP Primary Gastroenterologist:  Celso [name], PA-C / Dr. [name]   Reason for Consultation:     Dysphagia        HPI:   Edward King is a 73 y.o. y/o male referred for consultation & management  by Mort Sawyers, FNP.    Scheduled for Colonoscopy with DR. Tobi Bastos 01/10/24.  Has Family Hx of Colon Cancer.  05/2023 Colonoscopy by Dr. Tobi Bastos showed very poor prep.  Repeat in 4 weeks with 2 day prep.  Wants to add EGD.  Is having dysphagia.  No previous EGD or Barium Swallow.  Hx CVA 2019, lymphedema, obesity, hypercholesterolemia, obstructive sleep apnea, polymyalgia rheumatica, peripheral vascular disease, and diabetes.    Past Medical History:  Diagnosis Date   Arthritis    Cat bite 07/31/2018   Cellulitis of right leg without foot    Colitis    Depression    Diabetes mellitus without complication (HCC)    Giant cell arteritis (HCC) 06/15/2015   History of chicken pox    History of measles    History of mumps    Hypertension    Lymph edema    both legs.  mostly left   Neuromuscular disorder (HCC)    Prostate CA (HCC) 2016   Radiation seed implants.   Stroke [facility])    Temporal arteritis (HCC)    Clinically dx Dr. Gavin Potters. Negative temporal artrtery bx   Wears hearing aid in both ears     Past Surgical History:  Procedure Laterality Date   ABDOMINOPLASTY     Carotid Doppler Ultrasound  08/22/2014   [facility]; Minimal plaque right. Minimal thisckening left Anterograde flow vertebrals   CATARACT EXTRACTION W/PHACO Right 07/03/2022   Procedure: CATARACT EXTRACTION PHACO AND INTRAOCULAR LENS PLACEMENT (IOC) RIGHT;  Surgeon: Nevada Crane, MD;  Location: [facility];  Service: Ophthalmology;  Laterality: Right;   Diabetic 2.36 00:18.8   CATARACT EXTRACTION W/PHACO Left 07/17/2022   Procedure: CATARACT EXTRACTION PHACO AND INTRAOCULAR LENS PLACEMENT (IOC) LEFT DIABETIC;  Surgeon: Nevada Crane, MD;  Location: [facility];  Service: Ophthalmology;  Laterality: Left;  Diabetic 3.06 00:23.1   COLONOSCOPY WITH PROPOFOL N/A 02/13/2018   Procedure: COLONOSCOPY WITH PROPOFOL;  Surgeon: Scot Jun, MD;  Location: [facility] ENDOSCOPY;  Service: Endoscopy;  Laterality: N/A;   COLONOSCOPY WITH PROPOFOL N/A 05/22/2023   Procedure: COLONOSCOPY WITH PROPOFOL;  Surgeon: [name], MD;  Location: [facility] ENDOSCOPY;  Service: Gastroenterology;  Laterality: N/A;   CT of the head  08/22/2014   [facility]. Normal   DOPPLER ECHOCARDIOGRAPHY  08/29/2014   EF=60-65%. Normal LVEF   EYE SURGERY     Myocardial Perfusion Scan  09/10/2012   Poor exercsie tolerance, but no evidence of stress induced myocardial ischemia. EF=66%. Dr. Juliann Pares   NASAL SEPTOPLASTY W/ TURBINOPLASTY Bilateral 02/10/2016   Procedure: NASAL SEPTOPLASTY WITH INFERIOR TURBINATE REDUCTION;  Surgeon: Bud Face, MD;  Location: [facility] ORS;  Service: ENT;  Laterality: Bilateral;   pannectomy     TEMPORAL ARTERY BIOPSY / LIGATION Left     Prior to Admission medications   Medication Sig Start Date End Date Taking? Authorizing Provider  ALEVAZOL 1 % OINT APPLY 1 APPLICATION TOPICALLY 2 (TWO) TIMES DAILY FOR 14 DAYS. 03/29/23  Mort Sawyers, FNP  aspirin EC 81 MG tablet Take 1 tablet (81 mg total) by mouth daily. Swallow whole. 09/20/22   Mort Sawyers, FNP  Blood Glucose Monitoring Suppl (CONTOUR NEXT ONE) KIT Use to check blood sugar daily 07/05/23   Mort Sawyers, FNP  buPROPion (WELLBUTRIN SR) 150 MG 12 hr tablet Take 150 mg by mouth daily.    [provider]  clonazePAM (KLONOPIN) 0.5 MG tablet Take 0.5 mg by mouth 3 (three) times daily as needed for anxiety.    [provider]  Coenzyme Q10 (COQ-10) 100 MG CAPS Take 100 mg  by mouth daily.    [provider]  cyanocobalamin (VITAMIN B12) 1000 MCG tablet Take 1,000 mcg by mouth daily.    [provider]  donepezil (ARICEPT) 5 MG tablet Take 5 mg by mouth every morning.    [provider]  Dulaglutide (TRULICITY) 0.75 MG/0.5ML SOAJ Inject 0.75 mg into the skin once a week. 01/03/24   Mort Sawyers, FNP  furosemide (LASIX) 20 MG tablet Take 1 tablet (20 mg total) by mouth daily. 11/28/23   Mort Sawyers, FNP  gabapentin (NEURONTIN) 300 MG capsule TAKE 1 BY MOUTH EVERY DAY IN THE MORNING, 1 AT MID DAY, THEN 2 NIGHTLY 12/16/23   Mort Sawyers, FNP  glucose blood (CONTOUR NEXT TEST) test strip Use to check blood sugar daily 07/05/23   Mort Sawyers, FNP  mesalamine (APRISO) 0.375 g 24 hr capsule Take 375 mg by mouth daily.    [provider]  metFORMIN (GLUCOPHAGE-XR) 500 MG 24 hr tablet TAKE 2 TABLETS BY MOUTH TWICE A DAY 09/11/23   Mort Sawyers, FNP  mirtazapine (REMERON) 30 MG tablet Take 30 mg by mouth at bedtime. 10/12/22   [provider]  oxybutynin (DITROPAN-XL) 5 MG 24 hr tablet Take 1 tablet (5 mg total) by mouth at bedtime. 01/03/24   Vanna Scotland, MD  pravastatin (PRAVACHOL) 40 MG tablet Take 1 tablet (40 mg total) by mouth daily with lunch. 10/30/23   Mort Sawyers, FNP  QUEtiapine (SEROQUEL) 25 MG tablet Take 25 mg by mouth at bedtime. 12/31/23   [provider]  tamsulosin (FLOMAX) 0.4 MG CAPS capsule Take 0.4 mg by mouth daily.    [provider]  triamcinolone cream (KENALOG) 0.1 % Apply 1 Application topically 2 (two) times daily. 09/26/23   Mort Sawyers, FNP  valsartan (DIOVAN) 40 MG tablet TAKE 1 TABLET BY MOUTH EVERY DAY 10/18/23   Mort Sawyers, FNP  venlafaxine XR (EFFEXOR-XR) 150 MG 24 hr capsule Take 150 mg by mouth daily with breakfast.    [provider]  Vitamin D, Cholecalciferol, 1000 units TABS Take 2,000 Units by mouth daily.    [provider]    Family  History  Problem Relation Age of Onset   Colon cancer Mother    Coronary artery disease Mother    Prostate cancer Father    Kidney disease Father    Dementia Brother    Diabetes Neg Hx      Social History   Tobacco Use   Smoking status: Never   Smokeless tobacco: Never  Vaping Use   Vaping status: Never Used  Substance Use Topics   Alcohol use: Not Currently    Comment: Rare - 1 drink   Drug use: No    Allergies as of 01/09/2024 - Review Complete 01/07/2024  Allergen Reaction Noted   Jardiance [empagliflozin] Other (See Comments) 03/27/2023    Review of Systems:  All systems reviewed and negative except where noted in HPI.   Physical Exam:  There were no vitals taken for this visit. No LMP for male patient.  General:   Alert,  Well-developed, well-nourished, pleasant and cooperative in NAD Lungs:  Respirations even and unlabored.  Clear throughout to auscultation.   No wheezes, crackles, or rhonchi. No acute distress. Heart:  Regular rate and rhythm; no murmurs, clicks, rubs, or gallops. Abdomen:  Normal bowel sounds.  No bruits.  Soft, and non-distended without masses, hepatosplenomegaly or hernias noted.  No Tenderness.  No guarding or rebound tenderness.    Neurologic:  Alert and oriented x3;  grossly normal neurologically. Psych:  Alert and cooperative. Normal mood and affect.  Imaging Studies: No results found.  Assessment and Plan:   Edward King is a 73 y.o. y/o male has been referred for   1.  Dysphagia  Barium swallow with tablet  Scheduling EGD I discussed risks of EGD with patient to include risk of bleeding, perforation, and risk of sedation.  Patient expressed understanding and agrees to proceed with EGD.   2.  Family history of colon cancer  Scheduling Colonoscopy I discussed risks of colonoscopy with patient to include risk of bleeding, colon perforation, and risk of sedation.  Patient expressed understanding and agrees to proceed with  colonoscopy.   2-day prep  3.  Constipation  MiraLAX, Linzess  Follow up ***  Celso [name], PA-C    BP check ***

## 2024-01-09 ENCOUNTER — Ambulatory Visit: Payer: PPO | Admitting: Physician Assistant

## 2024-01-09 NOTE — Telephone Encounter (Signed)
The patient called in to reschedule his appointment for a later day because he is worried about the ice.

## 2024-01-10 ENCOUNTER — Other Ambulatory Visit: Payer: Self-pay

## 2024-01-10 ENCOUNTER — Ambulatory Visit: Payer: PPO | Admitting: Certified Registered"

## 2024-01-10 ENCOUNTER — Ambulatory Visit
Admission: RE | Admit: 2024-01-10 | Discharge: 2024-01-10 | Disposition: A | Discharge status: Home / Self Care | Payer: PPO | Attending: Gastroenterology | Admitting: Gastroenterology

## 2024-01-10 ENCOUNTER — Encounter
Admission: RE | Disposition: A | Discharge status: Home / Self Care | Payer: Self-pay | Source: Home / Self Care | Attending: Gastroenterology

## 2024-01-10 ENCOUNTER — Encounter: Payer: Self-pay | Admitting: Gastroenterology

## 2024-01-10 DIAGNOSIS — Z6841 Body Mass Index (BMI) 40.0 and over, adult: Secondary | ICD-10-CM | POA: Insufficient documentation

## 2024-01-10 DIAGNOSIS — Z8546 Personal history of malignant neoplasm of prostate: Secondary | ICD-10-CM | POA: Insufficient documentation

## 2024-01-10 DIAGNOSIS — Z1211 Encounter for screening for malignant neoplasm of colon: Secondary | ICD-10-CM | POA: Diagnosis not present

## 2024-01-10 DIAGNOSIS — Z539 Procedure and treatment not carried out, unspecified reason: Secondary | ICD-10-CM | POA: Diagnosis not present

## 2024-01-10 DIAGNOSIS — I2510 Atherosclerotic heart disease of native coronary artery without angina pectoris: Secondary | ICD-10-CM | POA: Insufficient documentation

## 2024-01-10 DIAGNOSIS — I10 Essential (primary) hypertension: Secondary | ICD-10-CM | POA: Insufficient documentation

## 2024-01-10 DIAGNOSIS — E119 Type 2 diabetes mellitus without complications: Secondary | ICD-10-CM | POA: Insufficient documentation

## 2024-01-10 DIAGNOSIS — Z7984 Long term (current) use of oral hypoglycemic drugs: Secondary | ICD-10-CM | POA: Insufficient documentation

## 2024-01-10 DIAGNOSIS — Z7985 Long-term (current) use of injectable non-insulin antidiabetic drugs: Secondary | ICD-10-CM | POA: Insufficient documentation

## 2024-01-10 DIAGNOSIS — Z800 Family history of malignant neoplasm of digestive organs: Secondary | ICD-10-CM

## 2024-01-10 DIAGNOSIS — E66813 Obesity, class 3: Secondary | ICD-10-CM | POA: Diagnosis not present

## 2024-01-10 HISTORY — PX: COLONOSCOPY WITH PROPOFOL: SHX5780

## 2024-01-10 LAB — GLUCOSE, CAPILLARY: Glucose-Capillary: 129 mg/dL — ABNORMAL HIGH (ref 70–99)

## 2024-01-10 SURGERY — COLONOSCOPY WITH PROPOFOL
Anesthesia: General

## 2024-01-10 MED ORDER — PROPOFOL 10 MG/ML IV BOLUS
INTRAVENOUS | Status: DC | PRN
  Administered 2024-01-10: 40 mg via INTRAVENOUS

## 2024-01-10 MED ORDER — PROPOFOL 500 MG/50ML IV EMUL
INTRAVENOUS | Status: DC | PRN
  Administered 2024-01-10: 125 ug/kg/min via INTRAVENOUS

## 2024-01-10 MED ORDER — SODIUM CHLORIDE 0.9 % IV SOLN: INTRAVENOUS | Status: DC

## 2024-01-10 NOTE — Anesthesia Postprocedure Evaluation (Signed)
Anesthesia Post Note  Patient: Edward King  Procedure(s) Performed: COLONOSCOPY WITH PROPOFOL  Patient location during evaluation: Endoscopy Anesthesia Type: General Level of consciousness: awake and alert Pain management: pain level controlled Vital Signs Assessment: post-procedure vital signs reviewed and stable Respiratory status: spontaneous breathing, nonlabored ventilation, respiratory function stable and patient connected to nasal cannula oxygen Cardiovascular status: blood pressure returned to baseline and stable Postop Assessment: no apparent nausea or vomiting Anesthetic complications: no   No notable events documented.   Last Vitals:  Vitals:   01/10/24 0931 01/10/24 0941  BP: (!) 149/88 (!) 149/86  Pulse: 67 68  Resp: 17 17  Temp:    SpO2: 95% 95%    Last Pain:  Vitals:   01/10/24 0920  TempSrc: Temporal  PainSc:                  Lenard Simmer

## 2024-01-10 NOTE — Op Note (Signed)
[facility] Gastroenterology Patient Name: Edward King Procedure Date: 01/10/2024 9:10 AM MRN: [id number] Account #: [id number] Date of Birth: [birth year] Admit Type: Outpatient Age: 73 Room: [facility] ENDO ROOM 3 Gender: Male Note Status: Finalized Instrument Name: Prentice Docker [id number] Procedure:             Colonoscopy Indications:           Screening for colorectal malignant neoplasm Providers:             [name] MD, MD Referring MD:          [name] MD, MD (Referring MD), Mort Sawyers                         (Referring MD) Medicines:             Monitored Anesthesia Care Complications:         No immediate complications. Procedure:             Pre-Anesthesia Assessment:                        - Prior to the procedure, a History and Physical was                         performed, and patient medications, allergies and                         sensitivities were reviewed. The patient's tolerance                         of previous anesthesia was reviewed.                        - The risks and benefits of the procedure and the                         sedation options and risks were discussed with the                         patient. All questions were answered and informed                         consent was obtained.                        - ASA Grade Assessment: II - A patient with mild                         systemic disease.                        After obtaining informed consent, the colonoscope was                         passed under direct vision. Throughout the procedure,                         the patient's blood pressure, pulse, and oxygen                         saturations were monitored  continuously. The                         Colonoscope was introduced through the anus with the                         intention of advancing to the cecum. The scope was                         advanced to the sigmoid colon before the procedure was                          aborted. Medications were given. The colonoscopy was                         performed with ease. The patient tolerated the                         procedure well. The quality of the bowel preparation                         was unsatisfactory. Findings:      The perianal and digital rectal examinations were normal.      Stool was found in the recto-sigmoid colon, interfering with       visualization. Impression:            - Preparation of the colon was unsatisfactory.                        - Stool in the recto-sigmoid colon.                        - No specimens collected. Recommendation:        - Discharge patient to home (with escort).                        - Resume previous diet.                        - Continue present medications.                        - Repeat colonoscopy in 1 month because the bowel                         preparation was suboptimal. Procedure Code(s):     --- Professional ---                        [phone], 53, Colonoscopy, flexible; diagnostic,                         including collection of specimen(s) by brushing or                         washing, when performed (separate procedure) Diagnosis Code(s):     --- Professional ---                        Z12.11, Encounter for screening for malignant neoplasm  of colon CPT copyright 2022 American Medical Association. All rights reserved. The codes documented in this report are preliminary and upon coder review may  be revised to meet current compliance requirements. [name], MD [name] MD, MD 01/10/2024 9:17:58 AM This report has been signed electronically. Number of Addenda: 0 Note Initiated On: 01/10/2024 9:10 AM Total Procedure Duration: 0 hours 0 minutes 38 seconds  Estimated Blood Loss:  Estimated blood loss: none.      [facility]

## 2024-01-10 NOTE — H&P (Signed)
[name], MD [address], [address], [place], [place], [address] [address], [address], [place], [place], [address] Phone: [phone]  Fax: [phone]  Primary Care Physician:  Mort Sawyers, FNP   Pre-Procedure History & Physical: HPI:  Edward King is a 73 y.o. male is here for an colonoscopy.   Past Medical History:  Diagnosis Date   Arthritis    Cat bite 07/31/2018   Cellulitis of right leg without foot    Colitis    Depression    Diabetes mellitus without complication (HCC)    Giant cell arteritis (HCC) 06/15/2015   History of chicken pox    History of measles    History of mumps    Hypertension    Lymph edema    both legs.  mostly left   Neuromuscular disorder (HCC)    Prostate CA (HCC) 2016   Radiation seed implants.   Stroke [facility])    Temporal arteritis (HCC)    Clinically dx Dr. Gavin Potters. Negative temporal artrtery bx   Wears hearing aid in both ears     Past Surgical History:  Procedure Laterality Date   ABDOMINOPLASTY     Carotid Doppler Ultrasound  08/22/2014   [facility]; Minimal plaque right. Minimal thisckening left Anterograde flow vertebrals   CATARACT EXTRACTION W/PHACO Right 07/03/2022   Procedure: CATARACT EXTRACTION PHACO AND INTRAOCULAR LENS PLACEMENT (IOC) RIGHT;  Surgeon: Nevada Crane, MD;  Location: [facility];  Service: Ophthalmology;  Laterality: Right;  Diabetic 2.36 00:18.8   CATARACT EXTRACTION W/PHACO Left 07/17/2022   Procedure: CATARACT EXTRACTION PHACO AND INTRAOCULAR LENS PLACEMENT (IOC) LEFT DIABETIC;  Surgeon: Nevada Crane, MD;  Location: [facility]ERY [facility];  Service: Ophthalmology;  Laterality: Left;  Diabetic 3.06 00:23.1   COLONOSCOPY WITH PROPOFOL N/A 02/13/2018   Procedure: COLONOSCOPY WITH PROPOFOL;  Surgeon: Scot Jun, MD;  Location: [facility] ENDOSCOPY;  Service: Endoscopy;  Laterality: N/A;   COLONOSCOPY WITH PROPOFOL N/A 05/22/2023   Procedure: COLONOSCOPY WITH PROPOFOL;  Surgeon:  [name], MD;  Location: [facility] ENDOSCOPY;  Service: Gastroenterology;  Laterality: N/A;   CT of the head  08/22/2014   [facility]. Normal   DOPPLER ECHOCARDIOGRAPHY  08/29/2014   EF=60-65%. Normal LVEF   EYE SURGERY     Myocardial Perfusion Scan  09/10/2012   Poor exercsie tolerance, but no evidence of stress induced myocardial ischemia. EF=66%. Dr. Juliann Pares   NASAL SEPTOPLASTY W/ TURBINOPLASTY Bilateral 02/10/2016   Procedure: NASAL SEPTOPLASTY WITH INFERIOR TURBINATE REDUCTION;  Surgeon: Bud Face, MD;  Location: [facility] ORS;  Service: ENT;  Laterality: Bilateral;   pannectomy     TEMPORAL ARTERY BIOPSY / LIGATION Left     Prior to Admission medications   Medication Sig Start Date End Date Taking? Authorizing Provider  ALEVAZOL 1 % OINT APPLY 1 APPLICATION TOPICALLY 2 (TWO) TIMES DAILY FOR 14 DAYS. 03/29/23   Mort Sawyers, FNP  aspirin EC 81 MG tablet Take 1 tablet (81 mg total) by mouth daily. Swallow whole. 09/20/22   Mort Sawyers, FNP  Blood Glucose Monitoring Suppl (CONTOUR NEXT ONE) KIT Use to check blood sugar daily 07/05/23   Mort Sawyers, FNP  buPROPion (WELLBUTRIN SR) 150 MG 12 hr tablet Take 150 mg by mouth daily.    [provider]  clonazePAM (KLONOPIN) 0.5 MG tablet Take 0.5 mg by mouth 3 (three) times daily as needed for anxiety.    [provider]  Coenzyme Q10 (COQ-10) 100 MG CAPS Take 100 mg by  mouth daily.    [provider]  cyanocobalamin (VITAMIN B12) 1000 MCG tablet Take 1,000 mcg by mouth daily.    [provider]  donepezil (ARICEPT) 5 MG tablet Take 5 mg by mouth every morning.    [provider]  Dulaglutide (TRULICITY) 0.75 MG/0.5ML SOAJ Inject 0.75 mg into the skin once a week. 01/03/24   Mort Sawyers, FNP  furosemide (LASIX) 20 MG tablet Take 1 tablet (20 mg total) by mouth daily. 11/28/23   Mort Sawyers, FNP  gabapentin (NEURONTIN) 300 MG capsule TAKE 1 BY MOUTH EVERY DAY IN THE MORNING, 1 AT MID DAY, THEN 2  NIGHTLY 12/16/23   Mort Sawyers, FNP  glucose blood (CONTOUR NEXT TEST) test strip Use to check blood sugar daily 07/05/23   Mort Sawyers, FNP  mesalamine (APRISO) 0.375 g 24 hr capsule Take 375 mg by mouth daily.    [provider]  metFORMIN (GLUCOPHAGE-XR) 500 MG 24 hr tablet TAKE 2 TABLETS BY MOUTH TWICE A DAY 09/11/23   Mort Sawyers, FNP  mirtazapine (REMERON) 30 MG tablet Take 30 mg by mouth at bedtime. 10/12/22   [provider]  oxybutynin (DITROPAN-XL) 5 MG 24 hr tablet Take 1 tablet (5 mg total) by mouth at bedtime. 01/03/24   Vanna Scotland, MD  pravastatin (PRAVACHOL) 40 MG tablet Take 1 tablet (40 mg total) by mouth daily with lunch. 10/30/23   Mort Sawyers, FNP  QUEtiapine (SEROQUEL) 25 MG tablet Take 25 mg by mouth at bedtime. 12/31/23   [provider]  tamsulosin (FLOMAX) 0.4 MG CAPS capsule Take 0.4 mg by mouth daily.    [provider]  triamcinolone cream (KENALOG) 0.1 % Apply 1 Application topically 2 (two) times daily. 09/26/23   Mort Sawyers, FNP  valsartan (DIOVAN) 40 MG tablet TAKE 1 TABLET BY MOUTH EVERY DAY 10/18/23   Mort Sawyers, FNP  venlafaxine XR (EFFEXOR-XR) 150 MG 24 hr capsule Take 150 mg by mouth daily with breakfast.    [provider]  Vitamin D, Cholecalciferol, 1000 units TABS Take 2,000 Units by mouth daily.    [provider]    Allergies as of 12/24/2023 - Review Complete 12/24/2023  Allergen Reaction Noted   Jardiance [empagliflozin] Other (See Comments) 03/27/2023    Family History  Problem Relation Age of Onset   Colon cancer Mother    Coronary artery disease Mother    Prostate cancer Father    Kidney disease Father    Dementia Brother    Diabetes Neg Hx     Social History   Socioeconomic History   Marital status: Married    Spouse name: Not on file   Number of children: 1   Years of education: Not on file   Highest education level: Some college, no degree  Occupational  History   Occupation: Retired    Associate Professor: [employer]    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, 2017  Tobacco Use   Smoking status: Never   Smokeless tobacco: Never  Vaping Use   Vaping status: Never Used  Substance and Sexual Activity   Alcohol use: Not Currently    Comment: Rare - 1 drink   Drug use: No   Sexual activity: Not Currently  Other Topics Concern   Not on file  Social History Narrative   Not on file   Social Drivers of Health   Financial Resource Strain: Low Risk  (08/16/2023)   Overall Financial Resource Strain (CARDIA)    Difficulty of Paying  Living Expenses: Not hard at all  Food Insecurity: No Food Insecurity (08/28/2023)   Hunger Vital Sign    Worried About Running Out of Food in the Last Year: Never true    Ran Out of Food in the Last Year: Never true  Transportation Needs: No Transportation Needs (08/28/2023)   PRAPARE - Administrator, Civil Service (Medical): No    Lack of Transportation (Non-Medical): No  Physical Activity: Inactive (08/16/2023)   Exercise Vital Sign    Days of Exercise per Week: 0 days    Minutes of Exercise per Session: 0 min  Stress: No Stress Concern Present (08/16/2023)   [employer] of Occupational Health - Occupational Stress Questionnaire    Feeling of Stress : Only a little  Social Connections: Moderately Integrated (08/16/2023)   Social Connection and Isolation Panel [NHANES]    Frequency of Communication with Friends and Family: More than three times a week    Frequency of Social Gatherings with Friends and Family: Twice a week    Attends Religious Services: More than 4 times per year    Active Member of [employer] or Organizations: No    Attends Banker Meetings: Never    Marital Status: Married  Catering manager Violence: Not At Risk (08/16/2023)   Humiliation, Afraid, Rape, and Kick questionnaire    Fear of Current or Ex-Partner: No    Emotionally Abused: No    Physically Abused: No    Sexually  Abused: No    Review of Systems: See HPI, otherwise negative ROS  Physical Exam: There were no vitals taken for this visit. General:   Alert,  pleasant and cooperative in NAD Head:  Normocephalic and atraumatic. Neck:  Supple; no masses or thyromegaly. Lungs:  Clear throughout to auscultation, normal respiratory effort.    Heart:  +S1, +S2, Regular rate and rhythm, No edema. Abdomen:  Soft, nontender and nondistended. Normal bowel sounds, without guarding, and without rebound.   Neurologic:  Alert and  oriented x4;  grossly normal neurologically.  Impression/Plan: Edward King is here for an colonoscopy to be performed for Screening colonoscopy average risk   Risks, benefits, limitations, and alternatives regarding  colonoscopy have been reviewed with the patient.  Questions have been answered.  All parties agreeable.   [name], MD  01/10/2024, 8:15 AM

## 2024-01-10 NOTE — Anesthesia Preprocedure Evaluation (Signed)
Anesthesia Evaluation  Patient identified by MRN, date of birth, ID band Patient awake    Reviewed: Allergy & Precautions, NPO status , Patient's Chart, lab work & pertinent test results  History of Anesthesia Complications Negative for: history of anesthetic complications  Airway Mallampati: III  TM Distance: >3 FB Neck ROM: Full    Dental  (+) Teeth Intact, Dental Advidsory Given   Pulmonary neg shortness of breath, sleep apnea , neg COPD, neg recent URI, Patient abstained from smoking.Not current smoker   Pulmonary exam normal breath sounds clear to auscultation       Cardiovascular Exercise Tolerance: Poor METShypertension, Pt. on medications (-) angina + CAD and + Peripheral Vascular Disease  (-) Past MI and (-) Cardiac Stents Normal cardiovascular exam(-) dysrhythmias  Rhythm:Regular Rate:Normal - Systolic murmurs Unremarkable TTE in 2023   Neuro/Psych  PSYCHIATRIC DISORDERS  Depression    Residual right leg weakness CVA, Residual Symptoms    GI/Hepatic negative GI ROS, Neg liver ROS,neg GERD  ,,  Endo/Other  diabetes, Type 2  Class 3 obesity (BMI 42)On GLP1 agonist, last taken over a week ago. Denies GI symptoms today  Renal/GU negative Renal ROS     Musculoskeletal  (+) Arthritis ,    Abdominal  (+) + obese  Peds  Hematology H/o prostate cancer   Anesthesia Other Findings Past Medical History: No date: Arthritis 07/31/2018: Cat bite No date: Cellulitis of right leg without foot No date: Colitis No date: Depression No date: Diabetes mellitus without complication (HCC) 06/15/2015: Giant cell arteritis (HCC) No date: History of chicken pox No date: History of measles No date: History of mumps No date: Hypertension No date: Lymph edema     Comment:  both legs.  mostly left No date: Neuromuscular disorder (HCC) 2016: Prostate CA (HCC)     Comment:  Radiation seed implants. No date: Stroke [facility]) No  date: Temporal arteritis (HCC)     Comment:  Clinically dx Dr. Gavin Potters. Negative temporal artrtery               bx No date: Wears hearing aid in both ears  Reproductive/Obstetrics                             Anesthesia Physical Anesthesia Plan  ASA: 3  Anesthesia Plan: General   Post-op Pain Management: Minimal or no pain anticipated   Induction: Intravenous  PONV Risk Score and Plan: 2 and TIVA, Treatment may vary due to age or medical condition and Propofol infusion  Airway Management Planned: Nasal Cannula and Natural Airway  Additional Equipment: None  Intra-op Plan:   Post-operative Plan:   Informed Consent: I have reviewed the patients History and Physical, chart, labs and discussed the procedure including the risks, benefits and alternatives for the proposed anesthesia with the patient or authorized representative who has indicated his/her understanding and acceptance.     Dental advisory given  Plan Discussed with: CRNA  Anesthesia Plan Comments: (Discussed risks of anesthesia with patient, including possibility of difficulty with spontaneous ventilation under anesthesia necessitating airway intervention, PONV, and rare risks such as cardiac or respiratory or neurological events, and allergic reactions. Discussed the role of CRNA in patient's perioperative care. Patient understands.)        Anesthesia Quick Evaluation

## 2024-01-10 NOTE — Anesthesia Procedure Notes (Signed)
Procedure Name: MAC Date/Time: 01/10/2024 9:12 AM  Performed by: Nelle Don, CRNAPre-anesthesia Checklist: Patient identified, Emergency Drugs available, Suction available and Patient being monitored Oxygen Delivery Method: Nasal cannula

## 2024-01-10 NOTE — Transfer of Care (Signed)
Immediate Anesthesia Transfer of Care Note  Patient: Edward King  Procedure(s) Performed: COLONOSCOPY WITH PROPOFOL  Patient Location: PACU  Anesthesia Type:General  Level of Consciousness: drowsy  Airway & Oxygen Therapy: Patient Spontanous Breathing and Patient connected to face mask oxygen  Post-op Assessment: Report given to RN, Post -op Vital signs reviewed and stable, and Patient moving all extremities X 4  Post vital signs: Reviewed and stable  Last Vitals:  Vitals Value Taken Time  BP 149/87 01/10/24 0920  Temp    Pulse 74 01/10/24 0921  Resp 14 01/10/24 0921  SpO2 90 % 01/10/24 0921  Vitals shown include unfiled device data.  Last Pain:  Vitals:   01/10/24 0828  TempSrc: Temporal  PainSc: 0-No pain         Complications: No notable events documented.

## 2024-01-11 ENCOUNTER — Telehealth: Payer: Self-pay | Admitting: Family

## 2024-01-11 NOTE — Telephone Encounter (Signed)
-----   Message -----  From: Mort Sawyers, FNP  Sent: 01/11/2024   7:10 AM EST  To: Mort Sawyers, FNP   Please call pt and ask if he was able to get his trulicity filled?  ------------------------------------------------------ Spoke with pt and he states that he was able to get this filled. Denies any side effects.

## 2024-01-13 NOTE — Telephone Encounter (Signed)
Noted

## 2024-01-21 ENCOUNTER — Other Ambulatory Visit: Payer: Self-pay | Admitting: Family

## 2024-01-21 DIAGNOSIS — Z8709 Personal history of other diseases of the respiratory system: Secondary | ICD-10-CM

## 2024-01-21 DIAGNOSIS — L231 Allergic contact dermatitis due to adhesives: Secondary | ICD-10-CM

## 2024-01-24 DIAGNOSIS — F418 Other specified anxiety disorders: Secondary | ICD-10-CM | POA: Diagnosis not present

## 2024-01-24 DIAGNOSIS — F331 Major depressive disorder, recurrent, moderate: Secondary | ICD-10-CM | POA: Diagnosis not present

## 2024-01-29 ENCOUNTER — Ambulatory Visit: Payer: PPO | Admitting: Family

## 2024-02-11 ENCOUNTER — Telehealth: Payer: Self-pay

## 2024-02-11 NOTE — Telephone Encounter (Signed)
 The patient called and left a voicemail requesting to speak with someone about a procedure that is about an EGD that's due this month. I called him back to let him know that we received his message and the patient wanted to know if he would be put to sleep when they do the EGD. I did inform him that they will put him to sleep for the EGD and he can has his colonoscopy done at the same time. I inform him that on 02/12/23 is an office to schedule him for his EGD. He said that he needs to cancel for now and he will call back to reschedule.

## 2024-02-13 ENCOUNTER — Ambulatory Visit: Payer: PPO | Admitting: Physician Assistant

## 2024-02-20 ENCOUNTER — Other Ambulatory Visit: Payer: Self-pay | Admitting: Family

## 2024-02-20 DIAGNOSIS — I152 Hypertension secondary to endocrine disorders: Secondary | ICD-10-CM

## 2024-02-20 DIAGNOSIS — I951 Orthostatic hypotension: Secondary | ICD-10-CM

## 2024-03-04 ENCOUNTER — Encounter: Payer: Self-pay | Admitting: Pharmacist

## 2024-03-04 NOTE — Progress Notes (Signed)
 Pharmacy Quality Measure Review  This patient is appearing on a report for being at risk of failing the adherence measure for diabetes medications this calendar year.   Medication: Trulicity 0.75 mg Last fill date: 01/03/24 for 28 day supply  Insurance report was not up to date. No action needed at this time.   Medication has been refilled as of 02/12/24 for 28 day supply   Consider 90 ds on refill to improve adherence

## 2024-03-06 ENCOUNTER — Ambulatory Visit: Payer: PPO | Admitting: Pulmonary Disease

## 2024-03-06 ENCOUNTER — Encounter: Payer: Self-pay | Admitting: Pulmonary Disease

## 2024-03-06 VITALS — BP 116/70 | HR 90 | Temp 97.8°F | Ht 71.0 in | Wt 284.6 lb

## 2024-03-06 DIAGNOSIS — I272 Pulmonary hypertension, unspecified: Secondary | ICD-10-CM | POA: Diagnosis not present

## 2024-03-06 DIAGNOSIS — I2729 Other secondary pulmonary hypertension: Secondary | ICD-10-CM

## 2024-03-06 NOTE — Progress Notes (Signed)
 Synopsis: Referred in by Mort Sawyers, FNP   Subjective:   PATIENT ID: Edward King GENDER: male DOB: 12-29-1950, MRN: [id number]  Chief Complaint  Patient presents with   Follow-up    No cough, shortness of breath or wheezing.     HPI [name] is a 73 year old male patient with a past medical history of hypertension, morbid obesity, CVA, OSA not compliant with CPAP, pleural effusion resolved on lasix presenting today to the pulmonary clinic as a referral from Mort Sawyers FNP for the evaluation of pulmonary hypertension seen on recent echocardiogram.   He reports recent admission for a fall in September 2024 complicated by left fourth sixth seventh eighth and ninth ribs acute fracture. Reports feeling dizzy prior to the fall but never lost consciousness. This has not happened to him before.   He recovered well and a follow up CXR end of September showed a new left pleural effusion as well as woresning LE edema. He was started on lasix.   Echocardiogram 10/31/2023 normal LVEF 60 to 65% no rwm abn. Grade I diastolic dysfunction. RV systolic function is normal. Moderately elevated PASP 46 mmHg.   CT scan of the chest 09/10 - no significant parenchymal lung disease.   He denies any systemic symptoms such as Dysphagia, raynauds, joint stiffness or rash. Denies any shortness of breath or dyspnea. Only complains of vivid disturbing dreams that are affecting his sleep.   03/06/2024  - does not have any complaints, reports compliance with CPAP machine. VQ scan negative for perfusion defect.   Family history - No family history of pulmonary disease.   Social history - Never smoker   ROS All systems were reviewed and are negative except for the above.  Objective:   Vitals:   03/06/24 1446  BP: 116/70  Pulse: 90  Temp: 97.8 F (36.6 C)  TempSrc: Temporal  SpO2: 93%  Weight: 284 lb 9.6 oz (129.1 kg)  Height: 5\' 11"  (1.803 m)   93% on RA BMI Readings from Last 3  Encounters:  03/06/24 39.69 kg/m  01/10/24 39.47 kg/m  01/03/24 40.11 kg/m   Wt Readings from Last 3 Encounters:  03/06/24 284 lb 9.6 oz (129.1 kg)  01/10/24 283 lb (128.4 kg)  01/03/24 287 lb 9.6 oz (130.5 kg)    Physical Exam GEN: NAD, Healthy Appearing HEENT: Supple Neck, Reactive Pupils, EOMI  CVS: Normal S1, Normal S2, RRR, No murmurs or ES appreciated  Lungs: Clear bilateral air entry.  Abdomen: Soft, non tender, non distended, + BS  Extremities: Warm and well perfused, No edema  Skin: No suspicious lesions appreciated  Psych: Normal Affect  Ancillary Information   CBC    Component Value Date/Time   WBC 9.2 11/28/2023 1206   RBC 4.86 11/28/2023 1206   HGB 13.5 11/28/2023 1206   HGB 13.6 05/05/2022 1529   HCT 42.0 11/28/2023 1206   HCT 41.6 05/05/2022 1529   PLT 252.0 11/28/2023 1206   PLT 279 05/05/2022 1529   MCV 86.5 11/28/2023 1206   MCV 83 05/05/2022 1529   MCV 87 08/30/2014 0450   MCH 28.6 08/30/2023 0406   MCHC 32.1 11/28/2023 1206   RDW 15.0 11/28/2023 1206   RDW 14.0 05/05/2022 1529   RDW 15.0 (H) 08/30/2014 0450   LYMPHSABS 1.1 08/30/2023 0406   LYMPHSABS 1.3 05/05/2022 1529   LYMPHSABS 0.9 (L) 08/30/2014 0450   MONOABS 0.7 08/30/2023 0406   MONOABS 0.1 (L) 08/30/2014 0450   EOSABS 0.1 08/30/2023  0406   EOSABS 0.3 05/05/2022 1529   EOSABS 0.0 08/30/2014 0450   BASOSABS 0.0 08/30/2023 0406   BASOSABS 0.0 05/05/2022 1529   BASOSABS 0.0 08/30/2014 0450       No data to display           Assessment & Plan:  [name] is a 73 year old male patient with a past medical history of hypertension, morbid obesity, CVA, OSA not compliant with CPAP, pleural effusion resolved on lasix presenting today to the pulmonary clinic as a referral from Mort Sawyers FNP for the evaluation of pulmonary hypertension seen on recent echocardiogram.  His echocardiogram in November does show moderately elevated pulmonary hypertension. He is asymptomatic from a  respiratory stand point but did have a presyncopal event leading to a fall 3 months ago which could be a symptom of PH. He does have worsening LE edema and left pleural effusion. However his Echo with normal RV function and size. PASP at 42 mmhg. TAPSE 3.4cm. 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 Although less likely to be, I will send a basic autoimmune panel, order a PFT and a perfusion scan. The main stay of his treatment will be diuresis and compliance with cpap for now. I will consider referral for RHC once the above results. WHO fct I or II.  I am not sure where his vivid dreams are coming from, he is seeing a psychiatrist, I will place a referral for sleep medicine.   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   No follow-ups on file.  I spent 35 minutes caring for this patient today, including preparing to see the patient, obtaining a medical history , reviewing a separately obtained history, performing a medically appropriate examination and/or evaluation, counseling and educating the patient/family/caregiver, ordering medications, tests, or procedures, documenting clinical information in the electronic health record, and independently interpreting results (not separately reported/billed) and communicating results to the patient/family/caregiver  Janann Colonel, MD [facility] 03/06/2024 2:52 PM

## 2024-03-18 ENCOUNTER — Telehealth: Payer: Self-pay

## 2024-03-18 ENCOUNTER — Other Ambulatory Visit: Payer: Self-pay

## 2024-03-18 DIAGNOSIS — Z1211 Encounter for screening for malignant neoplasm of colon: Secondary | ICD-10-CM

## 2024-03-18 MED ORDER — PEG 3350-KCL-NA BICARB-NACL 420 G PO SOLR: 8000.0000 mL | Freq: Once | ORAL | 0 refills | Status: AC

## 2024-03-18 NOTE — Telephone Encounter (Signed)
 Colonoscopy scheduled 04/16/24-2 day prep golytely- instructions mailed .

## 2024-03-25 DIAGNOSIS — F418 Other specified anxiety disorders: Secondary | ICD-10-CM | POA: Diagnosis not present

## 2024-03-25 DIAGNOSIS — R2689 Other abnormalities of gait and mobility: Secondary | ICD-10-CM | POA: Diagnosis not present

## 2024-03-25 DIAGNOSIS — E66813 Obesity, class 3: Secondary | ICD-10-CM | POA: Diagnosis not present

## 2024-03-25 DIAGNOSIS — F331 Major depressive disorder, recurrent, moderate: Secondary | ICD-10-CM | POA: Diagnosis not present

## 2024-03-25 DIAGNOSIS — F0150 Vascular dementia without behavioral disturbance: Secondary | ICD-10-CM | POA: Diagnosis not present

## 2024-03-25 DIAGNOSIS — G309 Alzheimer's disease, unspecified: Secondary | ICD-10-CM | POA: Diagnosis not present

## 2024-03-25 DIAGNOSIS — F0280 Dementia in other diseases classified elsewhere without behavioral disturbance: Secondary | ICD-10-CM | POA: Diagnosis not present

## 2024-03-25 DIAGNOSIS — Z6841 Body Mass Index (BMI) 40.0 and over, adult: Secondary | ICD-10-CM | POA: Diagnosis not present

## 2024-04-01 ENCOUNTER — Ambulatory Visit: Payer: Self-pay | Admitting: Physician Assistant

## 2024-04-01 ENCOUNTER — Encounter: Payer: Self-pay | Admitting: Physician Assistant

## 2024-04-03 ENCOUNTER — Ambulatory Visit: Payer: PPO | Admitting: Family

## 2024-04-03 ENCOUNTER — Other Ambulatory Visit: Payer: Self-pay

## 2024-04-03 DIAGNOSIS — I2729 Other secondary pulmonary hypertension: Secondary | ICD-10-CM

## 2024-04-07 ENCOUNTER — Ambulatory Visit: Admitting: Pulmonary Disease

## 2024-04-07 DIAGNOSIS — I2729 Other secondary pulmonary hypertension: Secondary | ICD-10-CM | POA: Diagnosis not present

## 2024-04-07 LAB — PULMONARY FUNCTION TEST
DL/VA % pred: 87 %
DL/VA: 3.43 ml/min/mmHg/L
DLCO unc % pred: 72 %
DLCO unc: 18.9 ml/min/mmHg
FEF 25-75 Post: 2.74 L/s
FEF 25-75 Pre: 2.74 L/s
FEF2575-%Change-Post: 0 %
FEF2575-%Pred-Post: 113 %
FEF2575-%Pred-Pre: 113 %
FEV1-%Change-Post: 0 %
FEV1-%Pred-Post: 91 %
FEV1-%Pred-Pre: 91 %
FEV1-Post: 3.01 L
FEV1-Pre: 2.98 L
FEV1FVC-%Change-Post: -1 %
FEV1FVC-%Pred-Pre: 107 %
FEV6-%Change-Post: 1 %
FEV6-%Pred-Post: 90 %
FEV6-%Pred-Pre: 89 %
FEV6-Post: 3.83 L
FEV6-Pre: 3.76 L
FEV6FVC-%Change-Post: 0 %
FEV6FVC-%Pred-Post: 104 %
FEV6FVC-%Pred-Pre: 105 %
FVC-%Change-Post: 2 %
FVC-%Pred-Post: 86 %
FVC-%Pred-Pre: 84 %
FVC-Post: 3.9 L
FVC-Pre: 3.8 L
Post FEV1/FVC ratio: 77 %
Post FEV6/FVC ratio: 98 %
Pre FEV1/FVC ratio: 79 %
Pre FEV6/FVC Ratio: 99 %
RV % pred: 97 %
RV: 2.5 L
TLC % pred: 86 %
TLC: 6.25 L

## 2024-04-07 NOTE — Patient Instructions (Signed)
 Full PFT completed today ? ?

## 2024-04-07 NOTE — Progress Notes (Signed)
 Full PFT completed today ? ?

## 2024-04-15 ENCOUNTER — Encounter: Payer: Self-pay | Admitting: Gastroenterology

## 2024-04-16 ENCOUNTER — Ambulatory Visit: Admitting: General Practice

## 2024-04-16 ENCOUNTER — Encounter: Payer: Self-pay | Admitting: Gastroenterology

## 2024-04-16 ENCOUNTER — Other Ambulatory Visit: Payer: Self-pay

## 2024-04-16 ENCOUNTER — Encounter
Admission: RE | Disposition: A | Discharge status: Home / Self Care | Payer: Self-pay | Source: Home / Self Care | Attending: Gastroenterology

## 2024-04-16 ENCOUNTER — Ambulatory Visit
Admission: RE | Admit: 2024-04-16 | Discharge: 2024-04-16 | Disposition: A | Discharge status: Home / Self Care | Attending: Gastroenterology | Admitting: Gastroenterology

## 2024-04-16 DIAGNOSIS — I10 Essential (primary) hypertension: Secondary | ICD-10-CM | POA: Diagnosis not present

## 2024-04-16 DIAGNOSIS — I2510 Atherosclerotic heart disease of native coronary artery without angina pectoris: Secondary | ICD-10-CM | POA: Diagnosis not present

## 2024-04-16 DIAGNOSIS — Z1211 Encounter for screening for malignant neoplasm of colon: Secondary | ICD-10-CM | POA: Insufficient documentation

## 2024-04-16 HISTORY — PX: COLONOSCOPY: SHX5424

## 2024-04-16 LAB — GLUCOSE, CAPILLARY: Glucose-Capillary: 146 mg/dL — ABNORMAL HIGH (ref 70–99)

## 2024-04-16 SURGERY — COLONOSCOPY
Anesthesia: General

## 2024-04-16 MED ORDER — PROPOFOL 500 MG/50ML IV EMUL
INTRAVENOUS | Status: DC | PRN
  Administered 2024-04-16: 75 ug/kg/min via INTRAVENOUS

## 2024-04-16 MED ORDER — PROPOFOL 10 MG/ML IV BOLUS
INTRAVENOUS | Status: DC | PRN
  Administered 2024-04-16: 50 mg via INTRAVENOUS

## 2024-04-16 MED ORDER — DEXMEDETOMIDINE HCL IN NACL 80 MCG/20ML IV SOLN
INTRAVENOUS | Status: DC | PRN
  Administered 2024-04-16: 20 ug via INTRAVENOUS

## 2024-04-16 MED ORDER — LIDOCAINE HCL (CARDIAC) PF 100 MG/5ML IV SOSY
PREFILLED_SYRINGE | INTRAVENOUS | Status: DC | PRN
  Administered 2024-04-16: 100 mg via INTRAVENOUS

## 2024-04-16 MED ORDER — DEXMEDETOMIDINE HCL IN NACL 80 MCG/20ML IV SOLN
INTRAVENOUS | Status: AC
  Filled 2024-04-16: qty 20

## 2024-04-16 MED ORDER — SODIUM CHLORIDE 0.9 % IV SOLN
INTRAVENOUS | Status: DC
Start: 2024-04-16 — End: 2024-04-16

## 2024-04-16 MED ORDER — LIDOCAINE HCL (PF) 2 % IJ SOLN
INTRAMUSCULAR | Status: AC
  Filled 2024-04-16: qty 5

## 2024-04-16 NOTE — Op Note (Signed)
 [facility] Gastroenterology Patient Name: Edward King Procedure Date: 04/16/2024 9:49 AM MRN: [id number] Account #: [id number] Date of Birth: Jul 26, 1951 Admit Type: Outpatient Age: 73 Room: [facility] ENDO ROOM 3 Gender: Male Note Status: Finalized Instrument Name: Hyman Main [id number] Procedure:             Colonoscopy Indications:           Screening for colorectal malignant neoplasm Providers:             Luke Salaam MD, MD Referring MD:          Luke Salaam MD, MD (Referring MD), Felicita Horns                         (Referring MD) Medicines:             Monitored Anesthesia Care Complications:         No immediate complications. Procedure:             Pre-Anesthesia Assessment:                        - Prior to the procedure, a History and Physical was                         performed, and patient medications, allergies and                         sensitivities were reviewed. The patient's tolerance                         of previous anesthesia was reviewed.                        - The risks and benefits of the procedure and the                         sedation options and risks were discussed with the                         patient. All questions were answered and informed                         consent was obtained.                        - ASA Grade Assessment: II - A patient with mild                         systemic disease.                        After obtaining informed consent, the colonoscope was                         passed under direct vision. Throughout the procedure,                         the patient's blood pressure, pulse, and oxygen                          saturations were monitored  continuously. The                         Colonoscope was introduced through the anus and                         advanced to the the cecum, identified by the                         appendiceal orifice. The colonoscopy was performed                         with  ease. The patient tolerated the procedure well.                         The quality of the bowel preparation was adequate. The                         ileocecal valve, appendiceal orifice, and rectum were                         photographed. Findings:      The perianal and digital rectal examinations were normal.      The entire examined colon appeared normal on direct and retroflexion       views. Impression:            - The entire examined colon is normal on direct and                         retroflexion views.                        - No specimens collected. Recommendation:        - Discharge patient to home (with escort).                        - Resume previous diet.                        - Continue present medications.                        - Repeat colonoscopy is not recommended due to current                         age (56 years or older) for screening purposes. Procedure Code(s):     --- Professional ---                        [phone], Colonoscopy, flexible; diagnostic, including                         collection of specimen(s) by brushing or washing, when                         performed (separate procedure) Diagnosis Code(s):     --- Professional ---                        Z12.11, Encounter for screening for malignant neoplasm  of colon CPT copyright 2022 [employer]. All rights reserved. The codes documented in this report are preliminary and upon coder review may  be revised to meet current compliance requirements. Luke Salaam, MD Luke Salaam MD, MD 04/16/2024 10:11:14 AM This report has been signed electronically. Number of Addenda: 0 Note Initiated On: 04/16/2024 9:49 AM Scope Withdrawal Time: 0 hours 7 minutes 47 seconds  Total Procedure Duration: 0 hours 9 minutes 55 seconds  Estimated Blood Loss:  Estimated blood loss: none.      [facility]

## 2024-04-16 NOTE — Anesthesia Preprocedure Evaluation (Signed)
 Anesthesia Evaluation  Patient identified by MRN, date of birth, ID band Patient awake    Reviewed: Allergy & Precautions, NPO status , Patient's Chart, lab work & pertinent test results  History of Anesthesia Complications Negative for: history of anesthetic complications  Airway Mallampati: III  TM Distance: >3 FB Neck ROM: Full    Dental  (+) Teeth Intact, Dental Advidsory Given   Pulmonary neg shortness of breath, sleep apnea , neg COPD, neg recent URI, Patient abstained from smoking.Not current smoker   Pulmonary exam normal breath sounds clear to auscultation       Cardiovascular Exercise Tolerance: Poor hypertension, Pt. on medications (-) angina + CAD and + Peripheral Vascular Disease  (-) Past MI and (-) Cardiac Stents Normal cardiovascular exam(-) dysrhythmias  Rhythm:Regular Rate:Normal - Systolic murmurs Unremarkable TTE in 2023   Neuro/Psych  PSYCHIATRIC DISORDERS  Depression    Residual right leg weakness CVA, Residual Symptoms    GI/Hepatic negative GI ROS, Neg liver ROS,neg GERD  ,,  Endo/Other  diabetes, Type 2  Class 3 obesity (BMI 42)On GLP1 agonist, last taken over a week ago. Denies GI symptoms today  Renal/GU      Musculoskeletal   Abdominal   Peds  Hematology H/o prostate cancer   Anesthesia Other Findings Past Medical History: No date: Arthritis 07/31/2018: Cat bite No date: Cellulitis of right leg without foot No date: Colitis No date: Depression No date: Diabetes mellitus without complication (HCC) 06/15/2015: Giant cell arteritis (HCC) No date: History of chicken pox No date: History of measles No date: History of mumps No date: Hypertension No date: Lymph edema     Comment:  both legs.  mostly left No date: Neuromuscular disorder (HCC) 2016: Prostate CA (HCC)     Comment:  Radiation seed implants. No date: Stroke [facility]) No date: Temporal arteritis (HCC)     Comment:  Clinically  dx Dr. [facility]. Negative temporal artrtery               bx No date: Wears hearing aid in both ears  Reproductive/Obstetrics                             Anesthesia Physical Anesthesia Plan  ASA: 3  Anesthesia Plan: General   Post-op Pain Management: Minimal or no pain anticipated   Induction: Intravenous  PONV Risk Score and Plan: 3 and Propofol  infusion, TIVA and Ondansetron   Airway Management Planned: Nasal Cannula  Additional Equipment: None  Intra-op Plan:   Post-operative Plan:   Informed Consent: I have reviewed the patients History and Physical, chart, labs and discussed the procedure including the risks, benefits and alternatives for the proposed anesthesia with the patient or authorized representative who has indicated his/her understanding and acceptance.     Dental advisory given  Plan Discussed with: CRNA and Surgeon  Anesthesia Plan Comments: (Discussed risks of anesthesia with patient, including possibility of difficulty with spontaneous ventilation under anesthesia necessitating airway intervention, PONV, and rare risks such as cardiac or respiratory or neurological events, and allergic reactions. Discussed the role of CRNA in patient's perioperative care. Patient understands.)       Anesthesia Quick Evaluation

## 2024-04-16 NOTE — Transfer of Care (Signed)
 Immediate Anesthesia Transfer of Care Note  Patient: Edward King  Procedure(s) Performed: COLONOSCOPY  Patient Location: PACU  Anesthesia Type:General  Level of Consciousness: sedated  Airway & Oxygen  Therapy: Patient Spontanous Breathing  Post-op Assessment: Report given to RN and Post -op Vital signs reviewed and stable  Post vital signs: Reviewed and stable  Last Vitals:  Vitals Value Taken Time  BP    Temp    Pulse    Resp    SpO2      Last Pain:  Vitals:   04/16/24 0921  TempSrc: Tympanic  PainSc: 0-No pain         Complications: No notable events documented.

## 2024-04-16 NOTE — Anesthesia Postprocedure Evaluation (Signed)
 Anesthesia Post Note  Patient: Edward King  Procedure(s) Performed: COLONOSCOPY  Patient location during evaluation: Endoscopy Anesthesia Type: General Level of consciousness: awake and alert Pain management: pain level controlled Vital Signs Assessment: post-procedure vital signs reviewed and stable Respiratory status: spontaneous breathing, nonlabored ventilation, respiratory function stable and patient connected to nasal cannula oxygen  Cardiovascular status: blood pressure returned to baseline and stable Postop Assessment: no apparent nausea or vomiting Anesthetic complications: no  No notable events documented.   Last Vitals:  Vitals:   04/16/24 1023 04/16/24 1032  BP: 108/69 133/80  Pulse: 70 66  Resp: 19 15  Temp:    SpO2: 93% 95%    Last Pain:  Vitals:   04/16/24 1032  TempSrc:   PainSc: 0-No pain                 Enrique Harvest

## 2024-04-16 NOTE — H&P (Signed)
 Luke Salaam, MD [address], [place], [place], [address] [address], [place], [place], [address] Phone: [phone]  Fax: [phone]  Primary Care Physician:  Felicita Horns, FNP   Pre-Procedure History & Physical: HPI:  Edward King is a 73 y.o. male is here for an colonoscopy.   Past Medical History:  Diagnosis Date   Arthritis    Cat bite 07/31/2018   Cellulitis of right leg without foot    Colitis    Depression    Diabetes mellitus without complication (HCC)    Giant cell arteritis (HCC) 06/15/2015   History of chicken pox    History of measles    History of mumps    Hypertension    Lymph edema    both legs.  mostly left   Neuromuscular disorder (HCC)    Prostate CA (HCC) 2016   Radiation seed implants.   Stroke [facility])    Temporal arteritis (HCC)    Clinically dx Dr. Kernodle. Negative temporal artrtery bx   Wears hearing aid in both ears     Past Surgical History:  Procedure Laterality Date   ABDOMINOPLASTY     Carotid Doppler Ultrasound  08/22/2014   [facility]; Minimal plaque right. Minimal thisckening left Anterograde flow vertebrals   CATARACT EXTRACTION W/PHACO Right 07/03/2022   Procedure: CATARACT EXTRACTION PHACO AND INTRAOCULAR LENS PLACEMENT (IOC) RIGHT;  Surgeon: Rosa College, MD;  Location: [facility];  Service: Ophthalmology;  Laterality: Right;  Diabetic 2.36 00:18.8   CATARACT EXTRACTION W/PHACO Left 07/17/2022   Procedure: CATARACT EXTRACTION PHACO AND INTRAOCULAR LENS PLACEMENT (IOC) LEFT DIABETIC;  Surgeon: Rosa College, MD;  Location: [facility];  Service: Ophthalmology;  Laterality: Left;  Diabetic 3.06 00:23.1   COLONOSCOPY WITH PROPOFOL  N/A 02/13/2018   Procedure: COLONOSCOPY WITH PROPOFOL ;  Surgeon: Cassie Click, MD;  Location: [facility];  Service: Endoscopy;  Laterality: N/A;   COLONOSCOPY WITH PROPOFOL  N/A 05/22/2023   Procedure: COLONOSCOPY WITH PROPOFOL ;  Surgeon:  Luke Salaam, MD;  Location: [facility] ENDOSCOPY;  Service: Gastroenterology;  Laterality: N/A;   COLONOSCOPY WITH PROPOFOL  N/A 01/10/2024   Procedure: COLONOSCOPY WITH PROPOFOL ;  Surgeon: Luke Salaam, MD;  Location: [facility] ENDOSCOPY;  Service: Gastroenterology;  Laterality: N/A;   CT of the head  08/22/2014   [facility]. Normal   DOPPLER ECHOCARDIOGRAPHY  08/29/2014   EF=60-65%. Normal LVEF   EYE SURGERY     Myocardial Perfusion Scan  09/10/2012   Poor exercsie tolerance, but no evidence of stress induced myocardial ischemia. EF=66%. Dr. Beau Bound   NASAL SEPTOPLASTY W/ TURBINOPLASTY Bilateral 02/10/2016   Procedure: NASAL SEPTOPLASTY WITH INFERIOR TURBINATE REDUCTION;  Surgeon: Rogers Clayman, MD;  Location: [facility] ORS;  Service: ENT;  Laterality: Bilateral;   pannectomy     TEMPORAL ARTERY BIOPSY / LIGATION Left     Prior to Admission medications   Medication Sig Start Date End Date Taking? Authorizing Provider  ALEVAZOL 1 % OINT APPLY 1 APPLICATION TOPICALLY 2 (TWO) TIMES DAILY FOR 14 DAYS. 03/29/23   Felicita Horns, FNP  aspirin  EC 81 MG tablet Take 1 tablet (81 mg total) by mouth daily. Swallow whole. 09/20/22  Yes Dugal, Tabitha, FNP  Blood Glucose Monitoring Suppl (CONTOUR NEXT ONE) KIT Use to check blood sugar daily 07/05/23   Felicita Horns, FNP  buPROPion  (WELLBUTRIN  SR) 150 MG 12 hr tablet Take 150 mg by mouth daily.    [provider]  clonazePAM  (KLONOPIN ) 0.5 MG tablet Take 0.5  mg by mouth 3 (three) times daily as needed for anxiety.   Yes [provider]  Coenzyme Q10 (COQ-10) 100 MG CAPS Take 100 mg by mouth daily.   Yes [provider]  cyanocobalamin  (VITAMIN B12) 1000 MCG tablet Take 1,000 mcg by mouth daily.   Yes [provider]  donepezil  (ARICEPT ) 5 MG tablet Take 5 mg by mouth every morning.   Yes [provider]  Dulaglutide  (TRULICITY ) 0.75 MG/0.5ML SOAJ Inject 0.75 mg into the skin once a week. 01/03/24   Dugal, Tabitha, FNP  furosemide   (LASIX ) 20 MG tablet TAKE 1 TABLET BY MOUTH EVERY DAY 01/21/24  Yes Dugal, Tabitha, FNP  gabapentin  (NEURONTIN ) 300 MG capsule TAKE 1 BY MOUTH EVERY DAY IN THE MORNING, 1 AT MID DAY, THEN 2 NIGHTLY 12/16/23  Yes Dugal, Tabitha, FNP  glucose blood (CONTOUR NEXT TEST) test strip Use to check blood sugar daily 07/05/23   Felicita Horns, FNP  mesalamine  (APRISO ) 0.375 g 24 hr capsule Take 375 mg by mouth daily.   Yes [provider]  metFORMIN  (GLUCOPHAGE -XR) 500 MG 24 hr tablet TAKE 2 TABLETS BY MOUTH TWICE A DAY 09/11/23  Yes Dugal, Tabitha, FNP  mirtazapine  (REMERON ) 30 MG tablet Take 30 mg by mouth at bedtime. 10/12/22  Yes [provider]  oxybutynin  (DITROPAN -XL) 5 MG 24 hr tablet Take 1 tablet (5 mg total) by mouth at bedtime. 01/03/24  Yes Dustin Gimenez, MD  pravastatin  (PRAVACHOL ) 40 MG tablet Take 1 tablet (40 mg total) by mouth daily with lunch. 10/30/23  Yes Dugal, Tabitha, FNP  QUEtiapine (SEROQUEL) 25 MG tablet Take 25 mg by mouth at bedtime. 12/31/23  Yes [provider]  tamsulosin  (FLOMAX ) 0.4 MG CAPS capsule Take 0.4 mg by mouth daily.   Yes [provider]  triamcinolone  cream (KENALOG ) 0.1 % APPLY TO AFFECTED AREA TWICE A DAY 01/21/24  Yes Dugal, Tabitha, FNP  valsartan  (DIOVAN ) 40 MG tablet TAKE 1 TABLET BY MOUTH EVERY DAY 02/20/24  Yes Dugal, Tabitha, FNP  venlafaxine  XR (EFFEXOR -XR) 150 MG 24 hr capsule Take 150 mg by mouth daily with breakfast.   Yes [provider]  Vitamin D , Cholecalciferol , 1000 units TABS Take 2,000 Units by mouth daily.   Yes [provider]    Allergies as of 03/18/2024 - Review Complete 03/06/2024  Allergen Reaction Noted   Jardiance  [empagliflozin ] Other (See Comments) 03/27/2023    Family History  Problem Relation Age of Onset   Colon cancer Mother    Coronary artery disease Mother    Prostate cancer Father    Kidney disease Father    Dementia Brother    Diabetes Neg Hx     Social History    Socioeconomic History   Marital status: Married    Spouse name: Not on file   Number of children: 1   Years of education: Not on file   Highest education level: Some college, no degree  Occupational History   Occupation: Retired    Associate Professor: [employer]    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, 2017  Tobacco Use   Smoking status: Never   Smokeless tobacco: Never  Vaping Use   Vaping status: Never Used  Substance and Sexual Activity   Alcohol use: Not Currently    Comment: Rare - 1 drink   Drug use: No   Sexual activity: Not Currently  Other Topics Concern   Not on file  Social History Narrative   Lives at home with wife  Social Drivers of Corporate investment banker Strain: Low Risk  (08/16/2023)   Overall Financial Resource Strain (CARDIA)    Difficulty of Paying Living Expenses: Not hard at all  Food Insecurity: No Food Insecurity (08/28/2023)   Hunger Vital Sign    Worried About Running Out of Food in the Last Year: Never true    Ran Out of Food in the Last Year: Never true  Transportation Needs: No Transportation Needs (08/28/2023)   PRAPARE - Administrator, Civil Service (Medical): No    Lack of Transportation (Non-Medical): No  Physical Activity: Inactive (08/16/2023)   Exercise Vital Sign    Days of Exercise per Week: 0 days    Minutes of Exercise per Session: 0 min  Stress: No Stress Concern Present (08/16/2023)   [employer] of Occupational Health - Occupational Stress Questionnaire    Feeling of Stress : Only a little  Social Connections: Moderately Integrated (08/16/2023)   Social Connection and Isolation Panel [NHANES]    Frequency of Communication with Friends and Family: More than three times a week    Frequency of Social Gatherings with Friends and Family: Twice a week    Attends Religious Services: More than 4 times per year    Active Member of [employer] or Organizations: No    Attends Banker Meetings: Never    Marital  Status: Married  Catering manager Violence: Not At Risk (08/16/2023)   Humiliation, Afraid, Rape, and Kick questionnaire    Fear of Current or Ex-Partner: No    Emotionally Abused: No    Physically Abused: No    Sexually Abused: No    Review of Systems: See HPI, otherwise negative ROS  Physical Exam: There were no vitals taken for this visit. General:   Alert,  pleasant and cooperative in NAD Head:  Normocephalic and atraumatic. Neck:  Supple; no masses or thyromegaly. Lungs:  Clear throughout to auscultation, normal respiratory effort.    Heart:  +S1, +S2, Regular rate and rhythm, No edema. Abdomen:  Soft, nontender and nondistended. Normal bowel sounds, without guarding, and without rebound.   Neurologic:  Alert and  oriented x4;  grossly normal neurologically.  Impression/Plan: [facility] is here for an colonoscopy to be performed for Screening colonoscopy average risk   Risks, benefits, limitations, and alternatives regarding  colonoscopy have been reviewed with the patient.  Questions have been answered.  All parties agreeable.   Luke Salaam, MD  04/16/2024, 9:22 AM

## 2024-04-17 ENCOUNTER — Encounter: Payer: Self-pay | Admitting: Gastroenterology

## 2024-04-17 NOTE — Telephone Encounter (Signed)
 Copied from CRM [id number]. Topic: Clinical - Lab/Test Results >> Apr 17, 2024  9:26 AM Juliana Ocean wrote: Reason for CRM: pt would like to know what they found out from his PFT that ws on 04/07/2024.

## 2024-04-22 DIAGNOSIS — H47393 Other disorders of optic disc, bilateral: Secondary | ICD-10-CM | POA: Diagnosis not present

## 2024-04-22 DIAGNOSIS — Z961 Presence of intraocular lens: Secondary | ICD-10-CM | POA: Diagnosis not present

## 2024-04-22 DIAGNOSIS — H353132 Nonexudative age-related macular degeneration, bilateral, intermediate dry stage: Secondary | ICD-10-CM | POA: Diagnosis not present

## 2024-05-06 ENCOUNTER — Ambulatory Visit: Payer: Self-pay | Admitting: Pulmonary Disease

## 2024-05-13 ENCOUNTER — Other Ambulatory Visit: Payer: Self-pay | Admitting: Family

## 2024-05-13 DIAGNOSIS — E1149 Type 2 diabetes mellitus with other diabetic neurological complication: Secondary | ICD-10-CM

## 2024-05-19 ENCOUNTER — Ambulatory Visit (INDEPENDENT_AMBULATORY_CARE_PROVIDER_SITE_OTHER): Payer: Self-pay | Admitting: Family

## 2024-05-19 ENCOUNTER — Encounter: Payer: Self-pay | Admitting: Family

## 2024-05-19 VITALS — BP 132/84 | HR 73 | Temp 98.0°F | Ht 71.0 in | Wt 295.0 lb

## 2024-05-19 DIAGNOSIS — Z125 Encounter for screening for malignant neoplasm of prostate: Secondary | ICD-10-CM | POA: Diagnosis not present

## 2024-05-19 DIAGNOSIS — E782 Mixed hyperlipidemia: Secondary | ICD-10-CM | POA: Diagnosis not present

## 2024-05-19 DIAGNOSIS — E119 Type 2 diabetes mellitus without complications: Secondary | ICD-10-CM

## 2024-05-19 DIAGNOSIS — G4719 Other hypersomnia: Secondary | ICD-10-CM | POA: Diagnosis not present

## 2024-05-19 DIAGNOSIS — J3489 Other specified disorders of nose and nasal sinuses: Secondary | ICD-10-CM | POA: Insufficient documentation

## 2024-05-19 DIAGNOSIS — Z7985 Long-term (current) use of injectable non-insulin antidiabetic drugs: Secondary | ICD-10-CM

## 2024-05-19 DIAGNOSIS — Z79899 Other long term (current) drug therapy: Secondary | ICD-10-CM

## 2024-05-19 DIAGNOSIS — M316 Other giant cell arteritis: Secondary | ICD-10-CM | POA: Insufficient documentation

## 2024-05-19 DIAGNOSIS — G4733 Obstructive sleep apnea (adult) (pediatric): Secondary | ICD-10-CM

## 2024-05-19 DIAGNOSIS — H3530 Unspecified macular degeneration: Secondary | ICD-10-CM | POA: Insufficient documentation

## 2024-05-19 LAB — CBC
HCT: 41.5 % (ref 39.0–52.0)
Hemoglobin: 13.4 g/dL (ref 13.0–17.0)
MCHC: 32.2 g/dL (ref 30.0–36.0)
MCV: 84.6 fl (ref 78.0–100.0)
Platelets: 225 10*3/uL (ref 150.0–400.0)
RBC: 4.9 Mil/uL (ref 4.22–5.81)
RDW: 15.1 % (ref 11.5–15.5)
WBC: 6.4 10*3/uL (ref 4.0–10.5)

## 2024-05-19 LAB — COMPREHENSIVE METABOLIC PANEL WITH GFR
ALT: 14 U/L (ref 0–53)
AST: 14 U/L (ref 0–37)
Albumin: 3.8 g/dL (ref 3.5–5.2)
Alkaline Phosphatase: 82 U/L (ref 39–117)
BUN: 21 mg/dL (ref 6–23)
CO2: 30 meq/L (ref 19–32)
Calcium: 8.9 mg/dL (ref 8.4–10.5)
Chloride: 103 meq/L (ref 96–112)
Creatinine, Ser: 1.38 mg/dL (ref 0.40–1.50)
GFR: 50.67 mL/min — ABNORMAL LOW (ref >60.00)
Glucose, Bld: 122 mg/dL — ABNORMAL HIGH (ref 70–99)
Potassium: 4.5 meq/L (ref 3.5–5.1)
Sodium: 140 meq/L (ref 135–145)
Total Bilirubin: 0.4 mg/dL (ref 0.2–1.2)
Total Protein: 6.2 g/dL (ref 6.0–8.3)

## 2024-05-19 LAB — HEMOGLOBIN A1C: Hgb A1c MFr Bld: 7.3 % — ABNORMAL HIGH (ref 4.6–6.5)

## 2024-05-19 LAB — PSA: PSA: 0 ng/mL — ABNORMAL LOW (ref 0.10–4.00)

## 2024-05-19 LAB — TSH: TSH: 3.61 u[IU]/mL (ref 0.35–5.50)

## 2024-05-19 LAB — VITAMIN B12: Vitamin B-12: 559 pg/mL (ref 211–911)

## 2024-05-19 NOTE — Assessment & Plan Note (Signed)
 Suspect allergies Short term use of flonase  ok but use with caution as with macular degeneration  If any blurry vision occurs stop this

## 2024-05-19 NOTE — Patient Instructions (Signed)
 You can try some over the counter flonase  for a few weeks to see if there is any improvement.

## 2024-05-19 NOTE — Assessment & Plan Note (Signed)
 Lacking trulicity  compliance due to fill error per pt with pharmacy  Continue trulicity  0.75 mg weekly  A1c poc in office today pending  Work on diabetic diet and exercise as tolerated. Yearly foot exam, and annual eye exam.  Urine microalbumin neg

## 2024-05-19 NOTE — Progress Notes (Signed)
 Established Patient Office Visit  Subjective:      CC:  Chief Complaint  Patient presents with   Acute Visit    Runny nose x2-3 weeks.    HPI: Edward King is a 73 y.o. male presenting on 05/19/2024 for Acute Visit (Runny nose x2-3 weeks.) . Overdue for f/u appt last seen 12/2023  DM2: trulicity  just started last week as was unable to get refill x about one month. He had taken it weekly prior to running out.  Last A1c increased to 7.5 Urine m/a negative Ongoing use of metformin  500 mg two tablets twie daily.   HLD: still taking pravastatin  40 mg once nightly. Tolerating well.  Lipid  panel in January LDL was controlled at 81  New complaints: Runny nose x 2-3 weeks or so. Has been tolerating it well but doesn't seem to improved. Worse at meal times. No itching or watery eyes or ears. No sore throat and or ear pain.   He does report ongoing fatigue and excessive daytime fatigue. He wants to check his b12 today. He also wants to be tested for his psa Luigi Sailor.   OSA: he states his cpap is not working so he has not been using this. He has to go to DME to get the CPAP fixed. Is with fatigue.    Social history:  Relevant past medical, surgical, family and social history reviewed and updated as indicated. Interim medical history since our last visit reviewed.  Allergies and medications reviewed and updated.  DATA REVIEWED: CHART IN EPIC     ROS: Negative unless specifically indicated above in HPI.    Current Outpatient Medications:    aspirin  EC 81 MG tablet, Take 1 tablet (81 mg total) by mouth daily. Swallow whole., Disp: 90 tablet, Rfl: 1   Blood Glucose Monitoring Suppl (CONTOUR NEXT ONE) KIT, Use to check blood sugar daily, Disp: 1 kit, Rfl: 0   buPROPion  (WELLBUTRIN  SR) 150 MG 12 hr tablet, Take 150 mg by mouth daily., Disp: , Rfl:    clonazePAM  (KLONOPIN ) 0.5 MG tablet, Take 0.5 mg by mouth 3 (three) times daily as needed for anxiety., Disp: , Rfl:    Coenzyme  Q10 (COQ-10) 100 MG CAPS, Take 100 mg by mouth daily., Disp: , Rfl:    cyanocobalamin  (VITAMIN B12) 1000 MCG tablet, Take 1,000 mcg by mouth daily., Disp: , Rfl:    donepezil  (ARICEPT ) 5 MG tablet, Take 5 mg by mouth every morning., Disp: , Rfl:    Dulaglutide  (TRULICITY ) 0.75 MG/0.5ML SOAJ, Inject 0.75 mg into the skin once a week., Disp: 2 mL, Rfl: 1   furosemide  (LASIX ) 20 MG tablet, TAKE 1 TABLET BY MOUTH EVERY DAY, Disp: 90 tablet, Rfl: 1   gabapentin  (NEURONTIN ) 300 MG capsule, TAKE 1 BY MOUTH EVERY DAY IN THE MORNING, 1 AT MID DAY, THEN 2 NIGHTLY, Disp: 120 capsule, Rfl: 2   glucose blood (CONTOUR NEXT TEST) test strip, Use to check blood sugar daily, Disp: 100 each, Rfl: 3   mesalamine  (APRISO ) 0.375 g 24 hr capsule, Take 375 mg by mouth daily., Disp: , Rfl:    metFORMIN  (GLUCOPHAGE -XR) 500 MG 24 hr tablet, TAKE 2 TABLETS BY MOUTH TWICE A DAY, Disp: 360 tablet, Rfl: 4   mirtazapine  (REMERON ) 30 MG tablet, Take 30 mg by mouth at bedtime., Disp: , Rfl:    oxybutynin  (DITROPAN -XL) 5 MG 24 hr tablet, Take 1 tablet (5 mg total) by mouth at bedtime., Disp: 30 tablet, Rfl: 11  pravastatin  (PRAVACHOL ) 40 MG tablet, Take 1 tablet (40 mg total) by mouth daily with lunch., Disp: 90 tablet, Rfl: 3   QUEtiapine (SEROQUEL) 25 MG tablet, Take 25 mg by mouth at bedtime., Disp: , Rfl:    tamsulosin  (FLOMAX ) 0.4 MG CAPS capsule, Take 0.4 mg by mouth daily., Disp: , Rfl:    valsartan  (DIOVAN ) 40 MG tablet, TAKE 1 TABLET BY MOUTH EVERY DAY, Disp: 90 tablet, Rfl: 1   venlafaxine  XR (EFFEXOR -XR) 150 MG 24 hr capsule, Take 150 mg by mouth daily with breakfast., Disp: , Rfl:    Vitamin D , Cholecalciferol , 1000 units TABS, Take 2,000 Units by mouth daily., Disp: , Rfl:       Objective:     BP 132/84 (BP Location: Right Arm, Patient Position: Sitting, Cuff Size: Large)   Pulse 73   Temp 98 F (36.7 C) (Temporal)   Ht 5\' 11"  (1.803 m)   Wt 295 lb (133.8 kg)   SpO2 95%   BMI 41.14 kg/m   Wt Readings  from Last 3 Encounters:  05/19/24 295 lb (133.8 kg)  04/16/24 285 lb 9.6 oz (129.5 kg)  04/07/24 290 lb 3.2 oz (131.6 kg)    Physical Exam Vitals reviewed.  Constitutional:      General: He is not in acute distress.    Appearance: Normal appearance. He is obese. He is not ill-appearing, toxic-appearing or diaphoretic.  HENT:     Head: Normocephalic.     Right Ear: Tympanic membrane normal.     Left Ear: Tympanic membrane normal.     Nose: Rhinorrhea present.     Mouth/Throat:     Mouth: Mucous membranes are moist.     Pharynx: Postnasal drip present.  Eyes:     Pupils: Pupils are equal, round, and reactive to light.  Cardiovascular:     Rate and Rhythm: Normal rate and regular rhythm.  Pulmonary:     Effort: Pulmonary effort is normal.     Breath sounds: Normal breath sounds. No wheezing.  Musculoskeletal:        General: Normal range of motion.     Cervical back: Normal range of motion.  Neurological:     General: No focal deficit present.     Mental Status: He is alert and oriented to person, place, and time. Mental status is at baseline.  Psychiatric:        Mood and Affect: Mood normal.        Behavior: Behavior normal.        Thought Content: Thought content normal.        Judgment: Judgment normal.           Assessment & Plan:  Mixed hyperlipidemia Assessment & Plan: Continue pravastatin  40 mg nightly.  Work on low cholesterol diet and exercise as tolerated    Type 2 diabetes mellitus without complication, without long-term current use of insulin  (HCC) Assessment & Plan: Lacking trulicity  compliance due to fill error per pt with pharmacy  Continue trulicity  0.75 mg weekly  A1c poc in office today pending  Work on diabetic diet and exercise as tolerated. Yearly foot exam, and annual eye exam.  Urine microalbumin neg   Orders: -     Hemoglobin A1c -     Comprehensive metabolic panel with GFR  Screening for malignant neoplasm of prostate -      PSA  OSA (obstructive sleep apnea)  Excessive daytime sleepiness -     Vitamin B12 -  Comprehensive metabolic panel with GFR -     CBC -     TSH  On statin therapy -     Comprehensive metabolic panel with GFR  Giant cell arteritis (HCC)  Macular degeneration of both eyes, unspecified type  Rhinorrhea Assessment & Plan: Suspect allergies Short term use of flonase  ok but use with caution as with macular degeneration  If any blurry vision occurs stop this      Return in about 6 months (around 11/18/2024) for f/u diabetes.  Felicita Horns, MSN, APRN, FNP-C [facility]

## 2024-05-19 NOTE — Assessment & Plan Note (Signed)
Continue pravastatin 40 mg nightly  Work on low cholesterol diet and exercise as tolerated

## 2024-05-20 ENCOUNTER — Ambulatory Visit: Payer: Self-pay | Admitting: Family

## 2024-05-20 DIAGNOSIS — R944 Abnormal results of kidney function studies: Secondary | ICD-10-CM

## 2024-05-21 DIAGNOSIS — E119 Type 2 diabetes mellitus without complications: Secondary | ICD-10-CM | POA: Diagnosis not present

## 2024-05-21 DIAGNOSIS — M1711 Unilateral primary osteoarthritis, right knee: Secondary | ICD-10-CM | POA: Diagnosis not present

## 2024-05-22 ENCOUNTER — Encounter: Payer: Self-pay | Admitting: Pharmacist

## 2024-05-22 NOTE — Progress Notes (Signed)
 Pharmacy Quality Measure Review  This patient is appearing on a report for being at risk of failing the adherence measure for diabetes medications this calendar year.   Medication: TRULICITY  0.75 MG Last fill date: 02/12/24 for 28 day supply  Insurance report was not up to date. No action needed at this time.  Medication has been refilled as of 05/10/24 for 28 day supply. 1 refill remaining. 2 months without refill hx. Previously documented compliance concern due to fill error per pt with pharmacy.

## 2024-06-02 ENCOUNTER — Other Ambulatory Visit (INDEPENDENT_AMBULATORY_CARE_PROVIDER_SITE_OTHER)

## 2024-06-02 DIAGNOSIS — R944 Abnormal results of kidney function studies: Secondary | ICD-10-CM | POA: Diagnosis not present

## 2024-06-03 ENCOUNTER — Ambulatory Visit: Payer: Self-pay | Admitting: Family

## 2024-06-03 LAB — BASIC METABOLIC PANEL WITH GFR
BUN: 23 mg/dL (ref 7–25)
CO2: 28 mmol/L (ref 20–32)
Calcium: 9.1 mg/dL (ref 8.6–10.3)
Chloride: 102 mmol/L (ref 98–110)
Creat: 1.26 mg/dL (ref 0.70–1.28)
Glucose, Bld: 116 mg/dL — ABNORMAL HIGH (ref 65–99)
Potassium: 4.3 mmol/L (ref 3.5–5.3)
Sodium: 142 mmol/L (ref 135–146)
eGFR: 60 mL/min/{1.73_m2} (ref >=60)

## 2024-06-14 ENCOUNTER — Other Ambulatory Visit: Payer: Self-pay | Admitting: Urology

## 2024-06-14 DIAGNOSIS — N401 Enlarged prostate with lower urinary tract symptoms: Secondary | ICD-10-CM

## 2024-06-14 DIAGNOSIS — N3941 Urge incontinence: Secondary | ICD-10-CM

## 2024-06-17 DIAGNOSIS — F331 Major depressive disorder, recurrent, moderate: Secondary | ICD-10-CM | POA: Diagnosis not present

## 2024-06-17 DIAGNOSIS — F418 Other specified anxiety disorders: Secondary | ICD-10-CM | POA: Diagnosis not present

## 2024-06-23 DIAGNOSIS — Z79899 Other long term (current) drug therapy: Secondary | ICD-10-CM | POA: Diagnosis not present

## 2024-06-23 DIAGNOSIS — F02A0 Dementia in other diseases classified elsewhere, mild, without behavioral disturbance, psychotic disturbance, mood disturbance, and anxiety: Secondary | ICD-10-CM | POA: Diagnosis not present

## 2024-06-23 DIAGNOSIS — F419 Anxiety disorder, unspecified: Secondary | ICD-10-CM | POA: Diagnosis not present

## 2024-06-23 DIAGNOSIS — G301 Alzheimer's disease with late onset: Secondary | ICD-10-CM | POA: Diagnosis not present

## 2024-06-23 DIAGNOSIS — G4733 Obstructive sleep apnea (adult) (pediatric): Secondary | ICD-10-CM | POA: Diagnosis not present

## 2024-06-23 DIAGNOSIS — Z1331 Encounter for screening for depression: Secondary | ICD-10-CM | POA: Diagnosis not present

## 2024-06-23 DIAGNOSIS — F32A Depression, unspecified: Secondary | ICD-10-CM | POA: Diagnosis not present

## 2024-06-23 DIAGNOSIS — G4700 Insomnia, unspecified: Secondary | ICD-10-CM | POA: Diagnosis not present

## 2024-06-23 DIAGNOSIS — I6381 Other cerebral infarction due to occlusion or stenosis of small artery: Secondary | ICD-10-CM | POA: Diagnosis not present

## 2024-07-03 ENCOUNTER — Encounter: Payer: Self-pay | Admitting: Family

## 2024-07-04 ENCOUNTER — Other Ambulatory Visit (HOSPITAL_COMMUNITY): Payer: Self-pay

## 2024-07-04 ENCOUNTER — Telehealth: Payer: Self-pay

## 2024-07-04 DIAGNOSIS — I639 Cerebral infarction, unspecified: Secondary | ICD-10-CM | POA: Diagnosis not present

## 2024-07-04 DIAGNOSIS — G4733 Obstructive sleep apnea (adult) (pediatric): Secondary | ICD-10-CM | POA: Diagnosis not present

## 2024-07-04 DIAGNOSIS — M316 Other giant cell arteritis: Secondary | ICD-10-CM | POA: Diagnosis not present

## 2024-07-04 DIAGNOSIS — I10 Essential (primary) hypertension: Secondary | ICD-10-CM | POA: Diagnosis not present

## 2024-07-04 DIAGNOSIS — R42 Dizziness and giddiness: Secondary | ICD-10-CM | POA: Diagnosis not present

## 2024-07-04 DIAGNOSIS — R609 Edema, unspecified: Secondary | ICD-10-CM | POA: Diagnosis not present

## 2024-07-04 DIAGNOSIS — E119 Type 2 diabetes mellitus without complications: Secondary | ICD-10-CM | POA: Diagnosis not present

## 2024-07-04 DIAGNOSIS — E669 Obesity, unspecified: Secondary | ICD-10-CM | POA: Diagnosis not present

## 2024-07-04 DIAGNOSIS — R0602 Shortness of breath: Secondary | ICD-10-CM | POA: Diagnosis not present

## 2024-07-04 NOTE — Telephone Encounter (Signed)
 Pharmacy Patient Advocate Encounter   Received notification from Pt Calls Messages that prior authorization for Trulicity  0.75 is required/requested.   Insurance verification completed.   The patient is insured through [facility] [employer]/RX [employer] .   Per test claim: PA required; PA submitted to above mentioned insurance via CoverMyMeds Key/confirmation #/EOC [id number] Status is pending

## 2024-07-04 NOTE — Telephone Encounter (Signed)
 Pharmacy Patient Advocate Encounter  Received notification from [employer] that Prior Authorization for Truclicity 0.75 has been APPROVED from 07/04/24 to 07/04/25. Ran test claim, Copay is $47.00. This test claim was processed through [facility]- copay amounts may vary at other pharmacies due to pharmacy/plan contracts, or as the patient moves through the different stages of their insurance plan.   PA #/Case ID/Reference #: [id number]

## 2024-07-11 DIAGNOSIS — M1711 Unilateral primary osteoarthritis, right knee: Secondary | ICD-10-CM | POA: Diagnosis not present

## 2024-07-24 NOTE — Progress Notes (Signed)
 This encounter was created in error - please disregard.

## 2024-07-29 DIAGNOSIS — M720 Palmar fascial fibromatosis [Dupuytren]: Secondary | ICD-10-CM | POA: Diagnosis not present

## 2024-07-29 DIAGNOSIS — M1711 Unilateral primary osteoarthritis, right knee: Secondary | ICD-10-CM | POA: Diagnosis not present

## 2024-07-30 DIAGNOSIS — G301 Alzheimer's disease with late onset: Secondary | ICD-10-CM | POA: Diagnosis not present

## 2024-07-30 DIAGNOSIS — F419 Anxiety disorder, unspecified: Secondary | ICD-10-CM | POA: Diagnosis not present

## 2024-07-30 DIAGNOSIS — G4733 Obstructive sleep apnea (adult) (pediatric): Secondary | ICD-10-CM | POA: Diagnosis not present

## 2024-07-30 DIAGNOSIS — G4700 Insomnia, unspecified: Secondary | ICD-10-CM | POA: Diagnosis not present

## 2024-07-30 DIAGNOSIS — F32A Depression, unspecified: Secondary | ICD-10-CM | POA: Diagnosis not present

## 2024-07-30 DIAGNOSIS — F02A0 Dementia in other diseases classified elsewhere, mild, without behavioral disturbance, psychotic disturbance, mood disturbance, and anxiety: Secondary | ICD-10-CM | POA: Diagnosis not present

## 2024-08-02 ENCOUNTER — Other Ambulatory Visit: Payer: Self-pay | Admitting: Family

## 2024-08-02 DIAGNOSIS — E1149 Type 2 diabetes mellitus with other diabetic neurological complication: Secondary | ICD-10-CM

## 2024-08-03 ENCOUNTER — Other Ambulatory Visit: Payer: Self-pay | Admitting: Family

## 2024-08-03 DIAGNOSIS — E1149 Type 2 diabetes mellitus with other diabetic neurological complication: Secondary | ICD-10-CM

## 2024-08-08 ENCOUNTER — Other Ambulatory Visit: Payer: Self-pay | Admitting: Family

## 2024-08-08 DIAGNOSIS — I152 Hypertension secondary to endocrine disorders: Secondary | ICD-10-CM

## 2024-08-08 DIAGNOSIS — I951 Orthostatic hypotension: Secondary | ICD-10-CM

## 2024-08-11 ENCOUNTER — Encounter: Payer: Self-pay | Admitting: Pharmacist

## 2024-08-11 NOTE — Progress Notes (Signed)
 Pharmacy Quality Measure Review  This patient is appearing on a report for being at risk of failing the adherence measure for diabetes medications this calendar year.   Medication: Trulicity  0.75 mg Last fill date: 07/14/24 for 28 day supply  Insurance report was not up to date. No action needed at this time.  Medication has been refilled as of 08/06/24. Sold 08/07/24.  5 additional refills remaining.

## 2024-08-16 ENCOUNTER — Other Ambulatory Visit: Payer: Self-pay | Admitting: Family

## 2024-08-16 DIAGNOSIS — Z8709 Personal history of other diseases of the respiratory system: Secondary | ICD-10-CM

## 2024-08-27 ENCOUNTER — Ambulatory Visit (INDEPENDENT_AMBULATORY_CARE_PROVIDER_SITE_OTHER): Payer: PPO

## 2024-08-27 VITALS — Ht 71.0 in | Wt 295.0 lb

## 2024-08-27 DIAGNOSIS — Z0000 Encounter for general adult medical examination without abnormal findings: Secondary | ICD-10-CM

## 2024-08-27 NOTE — Progress Notes (Signed)
 Please attest and cosign this visit due to patients primary care provider not being in the office at the time the visit was completed.    Subjective:   Edward King is a 73 y.o. who presents for a Medicare Wellness preventive visit.  As a reminder, Annual Wellness Visits don't include a physical exam, and some assessments may be limited, especially if this visit is performed virtually. We may recommend an in-person follow-up visit with your provider if needed.  Visit Complete: Virtual I connected with  Edward King on 08/27/24 by a audio enabled telemedicine application and verified that I am speaking with the correct person using two identifiers.  Patient Location: Home  Provider Location: Office/Clinic  I discussed the limitations of evaluation and management by telemedicine. The patient expressed understanding and agreed to proceed.  Vital Signs: Because this visit was a virtual/telehealth visit, some criteria may be missing or patient reported. Any vitals not documented were not able to be obtained and vitals that have been documented are patient reported.  VideoDeclined- This patient declined Librarian, academic. Therefore the visit was completed with audio only.  Persons Participating in Visit: Patient.  AWV Questionnaire: No: Patient Medicare AWV questionnaire was not completed prior to this visit.  Cardiac Risk Factors include: advanced age (>87men, >36 women);diabetes mellitus;dyslipidemia;male gender;hypertension;obesity (BMI >30kg/m2);sedentary lifestyle     Objective:    Today's Vitals   08/27/24 1508 08/27/24 1509  Weight: 295 lb (133.8 kg)   Height: 5' 11 (1.803 m)   PainSc:  7    Body mass index is 41.14 kg/m.     08/27/2024    3:19 PM 04/16/2024    9:19 AM 04/16/2024    9:18 AM 01/10/2024    8:26 AM 08/28/2023   11:52 PM 08/28/2023    6:27 PM 08/16/2023    2:44 PM  Advanced Directives  Does Patient Have a Medical Advance  Directive? No No No No  No No  Would patient like information on creating a medical advance directive?  No - Patient declined  No - Patient declined No - Patient declined      Current Medications (verified) Outpatient Encounter Medications as of 08/27/2024  Medication Sig   aspirin  EC 81 MG tablet Take 1 tablet (81 mg total) by mouth daily. Swallow whole.   Blood Glucose Monitoring Suppl (CONTOUR NEXT ONE) KIT Use to check blood sugar daily   buPROPion  (WELLBUTRIN  SR) 150 MG 12 hr tablet Take 150 mg by mouth daily.   clonazePAM  (KLONOPIN ) 0.5 MG tablet Take 0.5 mg by mouth 3 (three) times daily as needed for anxiety.   Coenzyme Q10 (COQ-10) 100 MG CAPS Take 100 mg by mouth daily.   cyanocobalamin  (VITAMIN B12) 1000 MCG tablet Take 1,000 mcg by mouth daily.   donepezil  (ARICEPT ) 5 MG tablet Take 5 mg by mouth every morning.   Dulaglutide  (TRULICITY ) 0.75 MG/0.5ML SOAJ INJECT 0.75 MG INTO THE SKIN ONE TIME PER WEEK   furosemide  (LASIX ) 20 MG tablet TAKE 1 TABLET BY MOUTH EVERY DAY   gabapentin  (NEURONTIN ) 300 MG capsule TAKE 1 BY MOUTH EVERY DAY IN THE MORNING, 1 AT MID DAY, THEN 2 NIGHTLY   glucose blood (CONTOUR NEXT TEST) test strip Use to check blood sugar daily   mesalamine  (APRISO ) 0.375 g 24 hr capsule Take 375 mg by mouth daily.   metFORMIN  (GLUCOPHAGE -XR) 500 MG 24 hr tablet TAKE 2 TABLETS BY MOUTH TWICE A DAY   mirtazapine  (REMERON ) 30  MG tablet Take 30 mg by mouth at bedtime.   oxybutynin  (DITROPAN -XL) 5 MG 24 hr tablet Take 1 tablet (5 mg total) by mouth at bedtime.   pravastatin  (PRAVACHOL ) 40 MG tablet Take 1 tablet (40 mg total) by mouth daily with lunch.   QUEtiapine (SEROQUEL) 25 MG tablet Take 25 mg by mouth at bedtime.   tamsulosin  (FLOMAX ) 0.4 MG CAPS capsule Take 0.4 mg by mouth daily.   traMADol  (ULTRAM ) 50 MG tablet Take 50 mg by mouth every 6 (six) hours as needed. for pain Given by [facility]   valsartan  (DIOVAN ) 40 MG tablet TAKE 1 TABLET BY MOUTH EVERY DAY    venlafaxine  XR (EFFEXOR -XR) 150 MG 24 hr capsule Take 150 mg by mouth daily with breakfast.   Vitamin D , Cholecalciferol , 1000 units TABS Take 2,000 Units by mouth daily.   No facility-administered encounter medications on file as of 08/27/2024.    Allergies (verified) Jardiance  [empagliflozin ]   History: Past Medical History:  Diagnosis Date   Arthritis    Cat bite 07/31/2018   Cellulitis of right leg without foot    Colitis    Depression    Diabetes mellitus without complication (HCC)    Giant cell arteritis (HCC) 06/15/2015   History of chicken pox    History of measles    History of mumps    Hypertension    Lymph edema    both legs.  mostly left   Neuromuscular disorder (HCC)    Prostate CA (HCC) 2016   Radiation seed implants.   Stroke [facility])    Temporal arteritis (HCC)    Clinically dx Dr. [facility]le. Negative temporal artrtery bx   Wears hearing aid in both ears    Past Surgical History:  Procedure Laterality Date   ABDOMINOPLASTY     Carotid Doppler Ultrasound  08/22/2014   [facility]; Minimal plaque right. Minimal thisckening left Anterograde flow vertebrals   CATARACT EXTRACTION W/PHACO Right 07/03/2022   Procedure: CATARACT EXTRACTION PHACO AND INTRAOCULAR LENS PLACEMENT (IOC) RIGHT;  Surgeon: Myrna Adine Anes, MD;  Location: [facility];  Service: Ophthalmology;  Laterality: Right;  Diabetic 2.36 00:18.8   CATARACT EXTRACTION W/PHACO Left 07/17/2022   Procedure: CATARACT EXTRACTION PHACO AND INTRAOCULAR LENS PLACEMENT (IOC) LEFT DIABETIC;  Surgeon: Myrna Adine Anes, MD;  Location: [facility];  Service: Ophthalmology;  Laterality: Left;  Diabetic 3.06 00:23.1   COLONOSCOPY N/A 04/16/2024   Procedure: COLONOSCOPY;  Surgeon: Therisa Bi, MD;  Location: [facility] ENDOSCOPY;  Service: Gastroenterology;  Laterality: N/A;   COLONOSCOPY WITH PROPOFOL  N/A 02/13/2018   Procedure: COLONOSCOPY WITH PROPOFOL ;  Surgeon: Viktoria Lamar DASEN, MD;  Location: [facility]  ENDOSCOPY;  Service: Endoscopy;  Laterality: N/A;   COLONOSCOPY WITH PROPOFOL  N/A 05/22/2023   Procedure: COLONOSCOPY WITH PROPOFOL ;  Surgeon: Therisa Bi, MD;  Location: [facility] ENDOSCOPY;  Service: Gastroenterology;  Laterality: N/A;   COLONOSCOPY WITH PROPOFOL  N/A 01/10/2024   Procedure: COLONOSCOPY WITH PROPOFOL ;  Surgeon: Therisa Bi, MD;  Location: [facility] ENDOSCOPY;  Service: Gastroenterology;  Laterality: N/A;   CT of the head  08/22/2014   [facility]. Normal   DOPPLER ECHOCARDIOGRAPHY  08/29/2014   EF=60-65%. Normal LVEF   EYE SURGERY     Myocardial Perfusion Scan  09/10/2012   Poor exercsie tolerance, but no evidence of stress induced myocardial ischemia. EF=66%. Dr. Florencio   NASAL SEPTOPLASTY W/ TURBINOPLASTY Bilateral 02/10/2016   Procedure: NASAL SEPTOPLASTY WITH INFERIOR TURBINATE REDUCTION;  Surgeon: Carolee Hunter, MD;  Location: [facility] ORS;  Service: ENT;  Laterality: Bilateral;   pannectomy     TEMPORAL ARTERY BIOPSY / LIGATION Left    Family History  Problem Relation Age of Onset   Colon cancer Mother    Coronary artery disease Mother    Prostate cancer Father    Kidney disease Father    Dementia Brother    Diabetes Neg Hx    Social History   Socioeconomic History   Marital status: Married    Spouse name: Not on file   Number of children: 1   Years of education: Not on file   Highest education level: Some college, no degree  Occupational History   Occupation: Retired    Associate Professor: [employer]    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, 2017  Tobacco Use   Smoking status: Never   Smokeless tobacco: Never  Vaping Use   Vaping status: Never Used  Substance and Sexual Activity   Alcohol use: Not Currently    Comment: Rare - 1 drink   Drug use: No   Sexual activity: Not Currently  Other Topics Concern   Not on file  Social History Narrative   Lives at home with wife   Social Drivers of Health   Financial Resource Strain: Low Risk  (08/27/2024)   Overall Financial  Resource Strain (CARDIA)    Difficulty of Paying Living Expenses: Not hard at all  Food Insecurity: No Food Insecurity (08/27/2024)   Hunger Vital Sign    Worried About Running Out of Food in the Last Year: Never true    Ran Out of Food in the Last Year: Never true  Transportation Needs: No Transportation Needs (08/27/2024)   PRAPARE - Administrator, Civil Service (Medical): No    Lack of Transportation (Non-Medical): No  Physical Activity: Inactive (08/27/2024)   Exercise Vital Sign    Days of Exercise per Week: 0 days    Minutes of Exercise per Session: 0 min  Stress: No Stress Concern Present (08/27/2024)   [employer] of Occupational Health - Occupational Stress Questionnaire    Feeling of Stress: Not at all  Social Connections: Moderately Integrated (08/27/2024)   Social Connection and Isolation Panel    Frequency of Communication with Friends and Family: Three times a week    Frequency of Social Gatherings with Friends and Family: Once a week    Attends Religious Services: More than 4 times per year    Active Member of [employer] or Organizations: No    Attends Engineer, structural: Never    Marital Status: Married    Tobacco Counseling Counseling given: Not Answered    Clinical Intake:  Pre-visit preparation completed: Yes  Pain : 0-10 Pain Score: 7  Pain Type: Chronic pain Pain Location: Knee Pain Orientation: Right Pain Descriptors / Indicators: Aching Pain Onset: More than a month ago Pain Frequency: Intermittent Pain Relieving Factors: medication,ice ot hot pack  Pain Relieving Factors: medication,ice ot hot pack  BMI - recorded: 41.14 Nutritional Status: BMI > 30  Obese Nutritional Risks: None Diabetes: Yes CBG done?: No Did pt. bring in CBG monitor from home?: No  Lab Results  Component Value Date   HGBA1C 7.3 (H) 05/19/2024   HGBA1C 7.5 (H) 01/03/2024   HGBA1C 6.7 (A) 10/17/2023     How often do you need to have someone help  you when you read instructions, pamphlets, or other written materials from your doctor or pharmacy?: 1 - Never  Interpreter Needed?: No  Comments: lives with wife  Information entered by :: B.Arval Brandstetter,LPN   Activities of Daily Living     08/27/2024    3:20 PM  In your present state of health, do you have any difficulty performing the following activities:  Hearing? 1  Vision? 0  Difficulty concentrating or making decisions? 1  Walking or climbing stairs? 1  Dressing or bathing? 0  Doing errands, shopping? 0  Preparing Food and eating ? N  Using the Toilet? N  In the past six months, have you accidently leaked urine? N  Do you have problems with loss of bowel control? N  Managing your Medications? N  Managing your Finances? N  Housekeeping or managing your Housekeeping? N    Patient Care Team: Corwin Antu, FNP as PCP - General (Family Medicine) Florencio Cara BIRCH, MD as Consulting Physician (Cardiology) Maryl Zachary LELON Mickey., MD as Consulting Physician (Rheumatology) Kassie Ozell SAUNDERS, MD as Consulting Physician (Urology) Bernardino Elsie PARAS, MD as Attending Physician (Psychiatry) Myrna Adine Anes, MD as Consulting Physician (Ophthalmology) Williams-Toone, Deitra L, MD as Referring Physician (Pain Medicine)  I have updated your Care Teams any recent Medical Services you may have received from other providers in the past year.     Assessment:   This is a routine wellness examination for Edward King.  Hearing/Vision screen Hearing Screening - Comments:: Patient denies any hearing difficulties with hearing aids Vision Screening - Comments:: Pt says their vision is good with without glasses [facility]   Goals Addressed             This Visit's Progress    COMPLETED: Exercise 3x per week (30 min per time)       Recommend increasing exercise. Pt to start walking 3 days a week for 30 minutes.     Patient Stated   Not on track    08/27/24-, wants to move more      Patient Stated       No new goals       Depression Screen     08/27/2024    3:17 PM 08/16/2023    2:47 PM 05/05/2022    2:52 PM 03/14/2021    2:52 PM 08/17/2020    2:13 PM 08/20/2019   10:55 AM 04/01/2019    5:00 PM  PHQ 2/9 Scores  PHQ - 2 Score 1 0 0 1 0 1 1  PHQ- 9 Score  6 10        Fall Risk     08/27/2024    3:12 PM 11/28/2023   11:08 AM 08/11/2023   10:16 PM 05/05/2022    2:52 PM 03/14/2021    2:59 PM  Fall Risk   Falls in the past year? 0 0 1 0 0  Comment   due to lack of balance    Number falls in past yr: 0 0 0 0 0  Injury with Fall? 0 0 1 0 0  Risk for fall due to : No Fall Risks No Fall Risks Impaired balance/gait;Impaired mobility;Medication side effect    Follow up Education provided;Falls prevention discussed Falls evaluation completed Falls prevention discussed;Falls evaluation completed Falls evaluation completed       Data saved with a previous flowsheet row definition    MEDICARE RISK AT HOME:  Medicare Risk at Home Any stairs in or around the home?: Yes If so, are there any without handrails?: Yes Home free of loose throw rugs in walkways, pet beds, electrical cords, etc?: Yes Adequate lighting in your home to reduce  risk of falls?: Yes Life alert?: No Use of a cane, walker or w/c?: Yes Grab bars in the bathroom?: Yes Shower chair or bench in shower?: Yes Elevated toilet seat or a handicapped toilet?: Yes  TIMED UP AND GO:  Was the test performed?  No  Cognitive Function: 6CIT completed        08/27/2024    3:21 PM 08/20/2019   11:04 AM 05/22/2018    2:41 PM 05/09/2017   10:20 AM  6CIT Screen  What Year? 0 points 0 points 0 points 0 points  What month? 0 points 0 points 0 points 0 points  What time? 0 points 0 points 0 points 0 points  Count back from 20 0 points 2 points 0 points 0 points  Months in reverse 0 points 0 points 4 points 2 points  Repeat phrase 2 points 2 points 8 points 4 points  Total Score 2 points 4 points 12 points 6 points     Immunizations Immunization History  Administered Date(s) Administered   Fluad Quad(high Dose 65+) 09/05/2019, 08/30/2020, 08/26/2021, 09/13/2022   Fluad Trivalent(High Dose 65+) 08/30/2023   INFLUENZA, HIGH DOSE SEASONAL PF 10/13/2017, 10/22/2018   Influenza Whole 10/18/2012   Influenza,inj,Quad PF,6+ Mos 08/30/2015   Influenza,inj,quad, With Preservative 09/17/2018   Influenza-Unspecified 08/30/2015   Moderna Covid-19 Fall Seasonal Vaccine 22yrs & older 01/24/2023   PFIZER(Purple Top)SARS-COV-2 Vaccination 02/08/2020, 03/03/2020, 11/17/2020   Pneumococcal Conjugate-13 11/17/2015   Pneumococcal Polysaccharide-23 02/01/2006, 05/11/2017   RSV,unspecified 01/16/2024   Td 05/26/2018   Tdap 05/11/2017, 08/02/2018, 08/28/2023   Zoster, Live 04/29/2012    Screening Tests Health Maintenance  Topic Date Due   FOOT EXAM  Never done   Zoster Vaccines- Shingrix  (1 of 2) 09/16/1969   Diabetic kidney evaluation - Urine ACR  05/06/2023   Influenza Vaccine  07/18/2024   COVID-19 Vaccine (5 - 2025-26 season) 08/18/2024   OPHTHALMOLOGY EXAM  10/25/2024   HEMOGLOBIN A1C  11/18/2024   Diabetic kidney evaluation - eGFR measurement  06/02/2025   Medicare Annual Wellness (AWV)  08/27/2025   Colonoscopy  04/17/2027   DTaP/Tdap/Td (5 - Td or Tdap) 08/27/2033   Pneumococcal Vaccine: 50+ Years  Completed   Hepatitis C Screening  Completed   HPV VACCINES  Aged Out   Meningococcal B Vaccine  Aged Out    Health Maintenance Items Addressed: None due at this time. Pt will receive vaccines at their pharmacy to obtain   Additional Screening:  Vision Screening: Recommended annual ophthalmology exams for early detection of glaucoma and other disorders of the eye. Is the patient up to date with their annual eye exam?  Yes  Who is the provider or what is the name of the office in which the patient attends annual eye exams? Dr Myrna  Dental Screening: Recommended annual dental exams for proper oral  hygiene  Community Resource Referral / Chronic Care Management: CRR required this visit?  No   CCM required this visit?  No   Plan:    I have personally reviewed and noted the following in the patient's chart:   Medical and social history Use of alcohol, tobacco or illicit drugs  Current medications and supplements including opioid prescriptions. Patient is not currently taking opioid prescriptions. Functional ability and status Nutritional status Physical activity Advanced directives List of other physicians Hospitalizations, surgeries, and ER visits in previous 12 months Vitals Screenings to include cognitive, depression, and falls Referrals and appointments  In addition, I have reviewed and discussed with  patient certain preventive protocols, quality metrics, and best practice recommendations. A written personalized care plan for preventive services as well as general preventive health recommendations were provided to patient.   Erminio LITTIE Saris, LPN   [id number]   After Visit Summary: (MyChart) Due to this being a telephonic visit, the after visit summary with patients personalized plan was offered to patient via MyChart   Notes: Nothing significant to report at this time.

## 2024-08-27 NOTE — Patient Instructions (Signed)
 Edward King,  Thank you for taking the time for your Medicare Wellness Visit. I appreciate your continued commitment to your health goals. Please review the care plan we discussed, and feel free to reach out if I can assist you further.  Medicare recommends these wellness visits once per year to help you and your care team stay ahead of potential health issues. These visits are designed to focus on prevention, allowing your provider to concentrate on managing your acute and chronic conditions during your regular appointments.  Please note that Annual Wellness Visits do not include a physical exam. Some assessments may be limited, especially if the visit was conducted virtually. If needed, we may recommend a separate in-person follow-up with your provider.  Ongoing Care Seeing your primary care provider every 3 to 6 months helps us  monitor your health and provide consistent, personalized care.   Referrals If a referral was made during today's visit and you haven't received any updates within two weeks, please contact the referred provider directly to check on the status.  Recommended Screenings:  Health Maintenance  Topic Date Due   Complete foot exam   Never done   Zoster (Shingles) Vaccine (1 of 2) 09/16/1969   Yearly kidney health urinalysis for diabetes  05/06/2023   Flu Shot  07/18/2024   COVID-19 Vaccine (5 - 2025-26 season) 08/18/2024   Eye exam for diabetics  10/25/2024   Hemoglobin A1C  11/18/2024   Yearly kidney function blood test for diabetes  06/02/2025   Medicare Annual Wellness Visit  08/27/2025   Colon Cancer Screening  04/17/2027   DTaP/Tdap/Td vaccine (5 - Td or Tdap) 08/27/2033   Pneumococcal Vaccine for age over 22  Completed   Hepatitis C Screening  Completed   HPV Vaccine  Aged Out   Meningitis B Vaccine  Aged Out       04/16/2024    9:19 AM  Advanced Directives  Does Patient Have a Medical Advance Directive? No  Would patient like information on creating a  medical advance directive? No - Patient declined   Advance Care Planning is important because it: Ensures you receive medical care that aligns with your values, goals, and preferences. Provides guidance to your family and loved ones, reducing the emotional burden of decision-making during critical moments.  Vision: Annual vision screenings are recommended for early detection of glaucoma, cataracts, and diabetic retinopathy. These exams can also reveal signs of chronic conditions such as diabetes and high blood pressure.  Dental: Annual dental screenings help detect early signs of oral cancer, gum disease, and other conditions linked to overall health, including heart disease and diabetes.

## 2024-09-02 ENCOUNTER — Ambulatory Visit

## 2024-09-02 DIAGNOSIS — Z23 Encounter for immunization: Secondary | ICD-10-CM

## 2024-09-04 ENCOUNTER — Encounter: Payer: Self-pay | Admitting: Pharmacist

## 2024-09-04 NOTE — Progress Notes (Signed)
 Pharmacy Quality Measure Review  This patient is appearing on a report for being at risk of failing the adherence measure for diabetes medications this calendar year.   Medication: metformin  XR 500 mg, Trulicity  0.75 mg Last fill date:       Metformin : 05/23/24 for 90 day supply      Trulicity : 08/06/24 for 28 day supply  Insurance report was not up to date. No action needed at this time.  Metformin  has been refilled as of 08/27/24 x90 day supply. 1 additional refill remaining.  Trulicity  has not been refilled. Should have 5 refills remaining. Rx#: [id number] Called pharmacy to facilitate medication refill.

## 2024-09-09 DIAGNOSIS — F331 Major depressive disorder, recurrent, moderate: Secondary | ICD-10-CM | POA: Diagnosis not present

## 2024-09-09 DIAGNOSIS — F418 Other specified anxiety disorders: Secondary | ICD-10-CM | POA: Diagnosis not present

## 2024-09-22 DIAGNOSIS — E119 Type 2 diabetes mellitus without complications: Secondary | ICD-10-CM | POA: Diagnosis not present

## 2024-09-22 DIAGNOSIS — M1711 Unilateral primary osteoarthritis, right knee: Secondary | ICD-10-CM | POA: Diagnosis not present

## 2024-10-14 DIAGNOSIS — G4700 Insomnia, unspecified: Secondary | ICD-10-CM | POA: Diagnosis not present

## 2024-10-14 DIAGNOSIS — G301 Alzheimer's disease with late onset: Secondary | ICD-10-CM | POA: Diagnosis not present

## 2024-10-14 DIAGNOSIS — G4733 Obstructive sleep apnea (adult) (pediatric): Secondary | ICD-10-CM | POA: Diagnosis not present

## 2024-10-14 DIAGNOSIS — F02A0 Dementia in other diseases classified elsewhere, mild, without behavioral disturbance, psychotic disturbance, mood disturbance, and anxiety: Secondary | ICD-10-CM | POA: Diagnosis not present

## 2024-10-28 ENCOUNTER — Encounter: Payer: Self-pay | Admitting: Pharmacist

## 2024-10-28 NOTE — Progress Notes (Signed)
 Pharmacy Quality Measure Review  This patient is appearing on a report for being at risk of failing the adherence measure for diabetes medications this calendar year.   Medication: Trulicity  0.75 mg  Last fill date: 9/19 for 28 day supply  Contacted pharmacy to facilitate refills.

## 2024-10-29 DIAGNOSIS — M25561 Pain in right knee: Secondary | ICD-10-CM | POA: Diagnosis not present

## 2024-11-02 ENCOUNTER — Other Ambulatory Visit: Payer: Self-pay | Admitting: Family

## 2024-11-02 DIAGNOSIS — E1149 Type 2 diabetes mellitus with other diabetic neurological complication: Secondary | ICD-10-CM

## 2024-11-04 LAB — OPHTHALMOLOGY REPORT-SCANNED

## 2024-11-17 DIAGNOSIS — M25561 Pain in right knee: Secondary | ICD-10-CM | POA: Diagnosis not present

## 2024-11-18 ENCOUNTER — Ambulatory Visit: Admitting: Family

## 2024-11-18 ENCOUNTER — Encounter: Payer: Self-pay | Admitting: Family

## 2024-11-18 VITALS — BP 134/86 | HR 90 | Temp 98.0°F | Ht 71.0 in | Wt 304.0 lb

## 2024-11-18 DIAGNOSIS — R600 Localized edema: Secondary | ICD-10-CM

## 2024-11-18 DIAGNOSIS — E1142 Type 2 diabetes mellitus with diabetic polyneuropathy: Secondary | ICD-10-CM

## 2024-11-18 DIAGNOSIS — I10 Essential (primary) hypertension: Secondary | ICD-10-CM

## 2024-11-18 DIAGNOSIS — R635 Abnormal weight gain: Secondary | ICD-10-CM | POA: Diagnosis not present

## 2024-11-18 DIAGNOSIS — E782 Mixed hyperlipidemia: Secondary | ICD-10-CM

## 2024-11-18 LAB — COMPREHENSIVE METABOLIC PANEL WITH GFR
ALT: 10 U/L (ref 0–53)
AST: 11 U/L (ref 0–37)
Albumin: 3.7 g/dL (ref 3.5–5.2)
Alkaline Phosphatase: 90 U/L (ref 39–117)
BUN: 15 mg/dL (ref 6–23)
CO2: 33 meq/L — ABNORMAL HIGH (ref 19–32)
Calcium: 8.9 mg/dL (ref 8.4–10.5)
Chloride: 104 meq/L (ref 96–112)
Creatinine, Ser: 1.11 mg/dL (ref 0.40–1.50)
GFR: 65.57 mL/min (ref >60.00)
Glucose, Bld: 162 mg/dL — ABNORMAL HIGH (ref 70–99)
Potassium: 4.4 meq/L (ref 3.5–5.1)
Sodium: 141 meq/L (ref 135–145)
Total Bilirubin: 0.4 mg/dL (ref 0.2–1.2)
Total Protein: 6.1 g/dL (ref 6.0–8.3)

## 2024-11-18 LAB — T4, FREE: Free T4: 0.66 ng/dL (ref 0.60–1.60)

## 2024-11-18 LAB — HEMOGLOBIN A1C: Hgb A1c MFr Bld: 7.4 % — ABNORMAL HIGH (ref 4.6–6.5)

## 2024-11-18 LAB — BRAIN NATRIURETIC PEPTIDE: Pro B Natriuretic peptide (BNP): 61 pg/mL (ref 0.0–100.0)

## 2024-11-18 LAB — TSH: TSH: 2.55 u[IU]/mL (ref 0.35–5.50)

## 2024-11-18 NOTE — Progress Notes (Signed)
 Established Patient Office Visit  Subjective:      CC:  Chief Complaint  Patient presents with   Medical Management of Chronic Issues    HPI: Edward King is a 73 y.o. male presenting on 11/18/2024 for Medical Management of Chronic Issues .  Discussed the use of AI scribe software for clinical note transcription with the patient, who gave verbal consent to proceed.  History of Present Illness Edward King is a 73 year old male with lymphedema who presents with weight gain and leg swelling.  He has experienced recent weight gain, which he attributes to fluid retention rather than increased caloric intake. His legs have been swelling. Despite wearing compression garments, he has noticed a significant increase in swelling recently. He has not been taking his prescribed furosemide  regularly.  He is scheduled for a right knee replacement surgery after the first of the year. He is currently attending physical therapy for his knees and performs exercises both at the facility and at home. No significant increase in shortness of breath with activity, despite the swelling in his legs.  He has a history of neuropathy affecting his feet, causing them to feel cold at times. He denies any burning sensation but describes a 'little problem' in his feet. No pain in his legs beyond the knee pain and neuropathy affecting his feet.  He has been less active recently due to his knee issues and a previous light stroke, which has contributed to his decreased mobility. Despite this, he continues to engage in physical therapy to maintain his activity level.      Wt Readings from Last 3 Encounters:  11/18/24 (!) 304 lb (137.9 kg)  08/27/24 295 lb (133.8 kg)  05/19/24 295 lb (133.8 kg)           Social history:  Relevant past medical, surgical, family and social history reviewed and updated as indicated. Interim medical history since our last visit reviewed.  Allergies and  medications reviewed and updated.  DATA REVIEWED: CHART IN EPIC     ROS: Negative unless specifically indicated above in HPI.    Current Outpatient Medications:    aspirin  EC 81 MG tablet, Take 1 tablet (81 mg total) by mouth daily. Swallow whole., Disp: 90 tablet, Rfl: 1   Blood Glucose Monitoring Suppl (CONTOUR NEXT ONE) KIT, Use to check blood sugar daily, Disp: 1 kit, Rfl: 0   buPROPion  (WELLBUTRIN  SR) 150 MG 12 hr tablet, Take 150 mg by mouth daily., Disp: , Rfl:    clonazePAM  (KLONOPIN ) 0.5 MG tablet, Take 0.5 mg by mouth 3 (three) times daily as needed for anxiety., Disp: , Rfl:    Coenzyme Q10 (COQ-10) 100 MG CAPS, Take 100 mg by mouth daily., Disp: , Rfl:    cyanocobalamin  (VITAMIN B12) 1000 MCG tablet, Take 1,000 mcg by mouth daily., Disp: , Rfl:    donepezil  (ARICEPT ) 5 MG tablet, Take 5 mg by mouth every morning., Disp: , Rfl:    Dulaglutide  (TRULICITY ) 0.75 MG/0.5ML SOAJ, INJECT 0.75 MG INTO THE SKIN ONE TIME PER WEEK, Disp: 2 mL, Rfl: 5   furosemide  (LASIX ) 20 MG tablet, TAKE 1 TABLET BY MOUTH EVERY DAY, Disp: 90 tablet, Rfl: 1   gabapentin  (NEURONTIN ) 300 MG capsule, TAKE 1 BY MOUTH EVERY DAY IN THE MORNING, 1 AT MID DAY, THEN 2 NIGHTLY, Disp: 120 capsule, Rfl: 2   glucose blood (CONTOUR NEXT TEST) test strip, Use to check blood sugar daily, Disp: 100 each, Rfl: 3  mesalamine  (APRISO ) 0.375 g 24 hr capsule, Take 375 mg by mouth daily., Disp: , Rfl:    metFORMIN  (GLUCOPHAGE -XR) 500 MG 24 hr tablet, TAKE 2 TABLETS BY MOUTH TWICE A DAY, Disp: 360 tablet, Rfl: 4   mirtazapine  (REMERON ) 30 MG tablet, Take 30 mg by mouth at bedtime., Disp: , Rfl:    oxybutynin  (DITROPAN -XL) 5 MG 24 hr tablet, Take 1 tablet (5 mg total) by mouth at bedtime., Disp: 30 tablet, Rfl: 11   pravastatin  (PRAVACHOL ) 40 MG tablet, Take 1 tablet (40 mg total) by mouth daily with lunch., Disp: 90 tablet, Rfl: 3   QUEtiapine (SEROQUEL) 25 MG tablet, Take 25 mg by mouth at bedtime., Disp: , Rfl:    tamsulosin   (FLOMAX ) 0.4 MG CAPS capsule, Take 0.4 mg by mouth daily., Disp: , Rfl:    traMADol  (ULTRAM ) 50 MG tablet, Take 50 mg by mouth every 6 (six) hours as needed. for pain Given by [facility], Disp: , Rfl:    valsartan  (DIOVAN ) 40 MG tablet, TAKE 1 TABLET BY MOUTH EVERY DAY, Disp: 90 tablet, Rfl: 1   venlafaxine  XR (EFFEXOR -XR) 150 MG 24 hr capsule, Take 150 mg by mouth daily with breakfast., Disp: , Rfl:    Vitamin D , Cholecalciferol , 1000 units TABS, Take 2,000 Units by mouth daily., Disp: , Rfl:         Objective:        BP 134/86 (BP Location: Right Arm, Patient Position: Sitting, Cuff Size: Large)   Pulse 90   Temp 98 F (36.7 C) (Temporal)   Ht 5' 11 (1.803 m)   Wt (!) 304 lb (137.9 kg)   SpO2 94%   BMI 42.40 kg/m   Physical Exam CHEST: Lungs clear to auscultation. CARDIOVASCULAR: Heart sounds normal. EXTREMITIES: Bilateral leg swelling, no pain besides knee.  Wt Readings from Last 3 Encounters:  11/18/24 (!) 304 lb (137.9 kg)  08/27/24 295 lb (133.8 kg)  05/19/24 295 lb (133.8 kg)    Physical Exam Vitals reviewed.  Constitutional:      General: He is not in acute distress.    Appearance: Normal appearance. He is normal weight. He is not ill-appearing, toxic-appearing or diaphoretic.  Cardiovascular:     Rate and Rhythm: Normal rate.     Comments: Venous stasis dermatitis without warmth  Pulmonary:     Effort: Pulmonary effort is normal.  Musculoskeletal:        General: Normal range of motion.     Right lower leg: 2+ Pitting Edema present.     Left lower leg: 2+ Pitting Edema present.  Neurological:     General: No focal deficit present.     Mental Status: He is alert and oriented to person, place, and time. Mental status is at baseline.  Psychiatric:        Mood and Affect: Mood normal.        Behavior: Behavior normal.        Thought Content: Thought content normal.        Judgment: Judgment normal.     Title   Diabetic Foot Exam - detailed Is  there a history of foot ulcer?: No Is there a foot ulcer now?: No Is there swelling?: Yes Is there elevated skin temperature?: No Is there abnormal foot shape?: No Is there a claw toe deformity?: No Are the toenails long?: No Are the toenails thick?: No Are the toenails ingrown?: No Is the skin thin, fragile, shiny and hairless?: Yes Normal Range of  Motion?: Yes Is there foot or ankle muscle weakness?: Yes Do you have pain in calf while walking?: No Are the shoes appropriate in style and fit?: Yes Can the patient see the bottom of their feet?: No Pulse Foot Exam completed.: Yes   Right Posterior Tibialis: Diminished Left posterior Tibialis: Diminished   Right Dorsalis Pedis: Diminished, Present Left Dorsalis Pedis: Diminished     Sensory Foot Exam Completed.: Yes Semmes-Weinstein Monofilament Test + means has sensation and - means no sensation  R Foot Test Control: Pos L Foot Test Control: Pos   R Site 1-Great Toe: Neg L Site 1-Great Toe: Neg   R Site 4: Neg L Site 4: Pos   R site 5: Pos L Site 5: Pos  R Site 6: Pos L Site 6: Neg     Image components are not supported.   Image components are not supported. Image components are not supported.  [place] Comments Venous stasis dermatitis          Results   Assessment & Plan:   Assessment and Plan Assessment & Plan Bilateral lower extremity edema Increased bilateral lower extremity edema, likely related to fluid retention. No significant shortness of breath or signs of congestive heart failure. Possible contributing factors include non-adherence to furosemide  and dietary salt intake. Differential includes vascular issues and compression. - Restart furosemide  20 mg oral daily in the morning. - Advised wearing compression stockings regularly. - Advised elevating legs to reduce swelling. - Advised monitoring and reducing dietary salt intake. - Will consider referral to vascular specialist if no  improvement or worsening of symptoms.  Type 2 diabetes mellitus with diabetic polyneuropathy Diabetic polyneuropathy with symptoms of cold feet and possible burning sensation. No significant pain reported. Neuropathy may contribute to cold sensation in feet. - Continue current diabetes management regimen.  Abnormal weight gain Recent weight gain, possibly related to medication side effects and dietary habits. No significant cognitive decline noted. Thyroid  function to be evaluated as a potential contributing factor. - Ordered repeat A1c to assess diabetes control. - Ordered kidney function tests. - Ordered thyroid  function tests to evaluate for thyroid  dysfunction. - Advised monitoring dietary intake, particularly sugar and salt.        Return in about 3 months (around 02/16/2025) for f/u diabetes.     Ginger Patrick, MSN, APRN, FNP-C [facility]

## 2024-11-19 LAB — MICROALBUMIN / CREATININE URINE RATIO
Creatinine,U: 73.4 mg/dL
Microalb Creat Ratio: UNDETERMINED mg/g (ref 0.0–30.0)
Microalb, Ur: <0.7 mg/dL

## 2024-11-20 ENCOUNTER — Ambulatory Visit: Payer: Self-pay | Admitting: Family

## 2024-11-20 ENCOUNTER — Encounter: Payer: Self-pay | Admitting: Pharmacist

## 2024-11-20 ENCOUNTER — Other Ambulatory Visit: Payer: Self-pay | Admitting: Family

## 2024-11-20 DIAGNOSIS — E1142 Type 2 diabetes mellitus with diabetic polyneuropathy: Secondary | ICD-10-CM

## 2024-11-20 DIAGNOSIS — L231 Allergic contact dermatitis due to adhesives: Secondary | ICD-10-CM

## 2024-11-20 DIAGNOSIS — M25561 Pain in right knee: Secondary | ICD-10-CM | POA: Diagnosis not present

## 2024-11-20 MED ORDER — TRULICITY 1.5 MG/0.5ML ~~LOC~~ SOAJ: 1.5000 mg | SUBCUTANEOUS | 1 refills | Status: AC

## 2024-11-20 NOTE — Progress Notes (Signed)
 Pharmacy Quality Measure Review  This patient is appearing on a report for being at risk of failing the adherence measure for diabetes medications this calendar year.   Medication: Trulicity  0.75 mg Last fill date: 09/05/24 for 28 day supply  Insurance report was not up to date. No action needed at this time.  Medication has since been refilled as of 10/28/24 x28 ds. Picked up 11/15.  Due for December refill. Confirmed with pharmacy, set to fill on 11/22/24.

## 2024-11-20 NOTE — Progress Notes (Signed)
 I am ok with him getting the covid vaccine (if he has not had the 2205/26 one he is eligible)  I am sending in the increased dose.  Have him let me know if he does not tolerate this we'll repeat labs at his f/u in march.

## 2024-11-22 ENCOUNTER — Other Ambulatory Visit: Payer: Self-pay | Admitting: Family

## 2024-11-22 DIAGNOSIS — E1149 Type 2 diabetes mellitus with other diabetic neurological complication: Secondary | ICD-10-CM

## 2024-11-25 ENCOUNTER — Encounter: Payer: Self-pay | Admitting: Family

## 2024-11-25 DIAGNOSIS — M25561 Pain in right knee: Secondary | ICD-10-CM | POA: Diagnosis not present

## 2024-12-03 ENCOUNTER — Other Ambulatory Visit: Payer: Self-pay | Admitting: Family

## 2024-12-21 ENCOUNTER — Other Ambulatory Visit: Payer: Self-pay | Admitting: Family

## 2024-12-21 DIAGNOSIS — L231 Allergic contact dermatitis due to adhesives: Secondary | ICD-10-CM

## 2025-01-01 NOTE — Progress Notes (Unsigned)
" ° °  01/02/2025 9:38 AM   Edward King January 28, 1951 [id number]  Reason for visit: Follow up prostate Ca, BPH   HPI: 74 y.o. male, initial follow up with me today, previously seen by Dr. 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  Laboratory Data: Component Ref Range & Units (hover) 1 mo ago (11/18/24) 7 mo ago (05/19/24) 12 mo ago (01/03/24) 1 yr ago (10/17/23) 1 yr ago (07/02/23) 1 yr ago (03/27/23) 2 yr ago (12/21/22)  Hgb A1c MFr Bld 7.4 High  7.3 High  CM 7.5 High  C       omponent Ref Range & Units (hover) 7 mo ago 2 yr ago 10 yr ago  PSA 0.00 Low  0.00 Repeated and verified X2. Low  CM 6.6 R   Pertinent Imaging: N/A    Assessment & Plan:    Prostate cancer [facility]) Assessment & Plan: Low-risk GS6 disease  - s/p brachytherapy in 2016  - undetectable PSA since treatment  Last PSA <0.1 (June 2025)   - Continue with annual PSA surveillance, next due ~June 2026  Orders: -     Bladder Scan (Post Void Residual) in office  Benign prostatic hyperplasia with urinary frequency Assessment & Plan: Chronic BPH + OAB   - On Flomax   - on Ditropan  10mg  XL  PVR 15 ml today IPSS 16/4  Seems like a subacute worsening in symptoms-although coincided with loss of medical therapy.  He needs to get back on both medications and reassess.  If he does not reach his treatment goal with consistent medical therapy, we may certainly consider for a surgical outlet procedure.  Will  need a office cystoscopy prior.  - Refill Flomax  and Ditropan  XL - Return to clinic if unhappy with symptoms, otherwise follow-up in 1 year for checkup  Orders: -     Bladder Scan (Post Void Residual) in office -     Tamsulosin  HCl; Take 1 capsule (0.4 mg total) by mouth daily.  Dispense: 30 capsule; Refill: 11 -     oxyBUTYnin  Chloride ER; Take 1 tablet (5 mg total) by mouth at bedtime.  Dispense: 30 tablet; Refill: 11       Edward JONELLE Skye, MD  [facility] [address] [place], [place] [address] [phone] "

## 2025-01-01 NOTE — Assessment & Plan Note (Signed)
 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

## 2025-01-01 NOTE — Assessment & Plan Note (Addendum)
 Chronic BPH + OAB   - On Flomax   - on Ditropan  10mg  XL  PVR 15 ml today IPSS 16/4  Seems like a subacute worsening in symptoms-although coincided with loss of medical therapy.  He needs to get back on both medications and reassess.  If he does not reach his treatment goal with consistent medical therapy, we may certainly consider for a surgical outlet procedure.  Will need a office cystoscopy prior.  - Refill Flomax  and Ditropan  XL - Return to clinic if unhappy with symptoms, otherwise follow-up in 1 year for checkup

## 2025-01-02 ENCOUNTER — Ambulatory Visit: Admitting: Urology

## 2025-01-02 VITALS — BP 118/75 | HR 74 | Ht 71.0 in | Wt 280.0 lb

## 2025-01-02 DIAGNOSIS — N401 Enlarged prostate with lower urinary tract symptoms: Secondary | ICD-10-CM

## 2025-01-02 DIAGNOSIS — C61 Malignant neoplasm of prostate: Secondary | ICD-10-CM

## 2025-01-02 DIAGNOSIS — R350 Frequency of micturition: Secondary | ICD-10-CM

## 2025-01-02 LAB — BLADDER SCAN AMB NON-IMAGING: Scan Result: 15

## 2025-01-02 MED ORDER — TAMSULOSIN HCL 0.4 MG PO CAPS: 0.4000 mg | ORAL_CAPSULE | Freq: Every day | ORAL | 11 refills | Status: AC

## 2025-01-02 MED ORDER — OXYBUTYNIN CHLORIDE ER 5 MG PO TB24: 5.0000 mg | ORAL_TABLET | Freq: Every day | ORAL | 11 refills | Status: AC

## 2025-02-16 ENCOUNTER — Ambulatory Visit: Admitting: Family

## 2025-08-28 ENCOUNTER — Ambulatory Visit

## 2026-01-01 ENCOUNTER — Ambulatory Visit: Admitting: Urology
